# Patient Record
Sex: Male | Born: 1955 | Race: White | Hispanic: No | Marital: Married | Smoking: Current every day smoker
Health system: Southern US, Community
[De-identification: ages and names within clinical notes are randomized; demographics above are authoritative.]

## PROBLEM LIST (undated history)

## (undated) DIAGNOSIS — I2510 Atherosclerotic heart disease of native coronary artery without angina pectoris: Secondary | ICD-10-CM

## (undated) DIAGNOSIS — F419 Anxiety disorder, unspecified: Secondary | ICD-10-CM

## (undated) DIAGNOSIS — I509 Heart failure, unspecified: Secondary | ICD-10-CM

## (undated) DIAGNOSIS — M797 Fibromyalgia: Secondary | ICD-10-CM

## (undated) DIAGNOSIS — E7800 Pure hypercholesterolemia, unspecified: Secondary | ICD-10-CM

## (undated) DIAGNOSIS — R319 Hematuria, unspecified: Secondary | ICD-10-CM

## (undated) DIAGNOSIS — K219 Gastro-esophageal reflux disease without esophagitis: Secondary | ICD-10-CM

## (undated) DIAGNOSIS — I219 Acute myocardial infarction, unspecified: Secondary | ICD-10-CM

## (undated) DIAGNOSIS — T4145XA Adverse effect of unspecified anesthetic, initial encounter: Secondary | ICD-10-CM

## (undated) DIAGNOSIS — G4730 Sleep apnea, unspecified: Secondary | ICD-10-CM

## (undated) DIAGNOSIS — Z9289 Personal history of other medical treatment: Secondary | ICD-10-CM

## (undated) DIAGNOSIS — T8859XA Other complications of anesthesia, initial encounter: Secondary | ICD-10-CM

## (undated) DIAGNOSIS — N189 Chronic kidney disease, unspecified: Secondary | ICD-10-CM

## (undated) DIAGNOSIS — J189 Pneumonia, unspecified organism: Secondary | ICD-10-CM

## (undated) DIAGNOSIS — M1990 Unspecified osteoarthritis, unspecified site: Secondary | ICD-10-CM

## (undated) DIAGNOSIS — C801 Malignant (primary) neoplasm, unspecified: Secondary | ICD-10-CM

## (undated) DIAGNOSIS — E119 Type 2 diabetes mellitus without complications: Secondary | ICD-10-CM

## (undated) DIAGNOSIS — Z992 Dependence on renal dialysis: Secondary | ICD-10-CM

## (undated) DIAGNOSIS — M549 Dorsalgia, unspecified: Secondary | ICD-10-CM

## (undated) DIAGNOSIS — Z8709 Personal history of other diseases of the respiratory system: Secondary | ICD-10-CM

## (undated) DIAGNOSIS — I10 Essential (primary) hypertension: Secondary | ICD-10-CM

## (undated) DIAGNOSIS — IMO0001 Reserved for inherently not codable concepts without codable children: Secondary | ICD-10-CM

## (undated) HISTORY — DX: Acute myocardial infarction, unspecified: I21.9

## (undated) HISTORY — PX: CHOLECYSTECTOMY: SHX55

## (undated) HISTORY — PX: COLONOSCOPY: SHX174

## (undated) HISTORY — PX: EYE SURGERY: SHX253

## (undated) HISTORY — PX: TONSILLECTOMY: SUR1361

---

## 2008-07-21 ENCOUNTER — Ambulatory Visit: Payer: Self-pay | Admitting: Cardiology

## 2013-01-24 ENCOUNTER — Encounter (HOSPITAL_COMMUNITY): Payer: Self-pay

## 2013-01-24 ENCOUNTER — Emergency Department (HOSPITAL_COMMUNITY)
Admission: EM | Admit: 2013-01-24 | Discharge: 2013-01-24 | Disposition: A | Discharge status: Home / Self Care | Payer: Commercial Indemnity | Attending: Emergency Medicine | Admitting: Emergency Medicine

## 2013-01-24 DIAGNOSIS — F17200 Nicotine dependence, unspecified, uncomplicated: Secondary | ICD-10-CM | POA: Insufficient documentation

## 2013-01-24 DIAGNOSIS — Z862 Personal history of diseases of the blood and blood-forming organs and certain disorders involving the immune mechanism: Secondary | ICD-10-CM | POA: Insufficient documentation

## 2013-01-24 DIAGNOSIS — Z8639 Personal history of other endocrine, nutritional and metabolic disease: Secondary | ICD-10-CM | POA: Insufficient documentation

## 2013-01-24 DIAGNOSIS — E119 Type 2 diabetes mellitus without complications: Secondary | ICD-10-CM | POA: Insufficient documentation

## 2013-01-24 DIAGNOSIS — M545 Low back pain, unspecified: Secondary | ICD-10-CM

## 2013-01-24 DIAGNOSIS — G8929 Other chronic pain: Secondary | ICD-10-CM | POA: Insufficient documentation

## 2013-01-24 DIAGNOSIS — I10 Essential (primary) hypertension: Secondary | ICD-10-CM | POA: Insufficient documentation

## 2013-01-24 HISTORY — DX: Dorsalgia, unspecified: M54.9

## 2013-01-24 HISTORY — DX: Essential (primary) hypertension: I10

## 2013-01-24 HISTORY — DX: Type 2 diabetes mellitus without complications: E11.9

## 2013-01-24 HISTORY — DX: Pure hypercholesterolemia, unspecified: E78.00

## 2013-01-24 MED ORDER — HYDROMORPHONE HCL PF 1 MG/ML IJ SOLN
1.0000 mg | INTRAMUSCULAR | Status: AC
  Administered 2013-01-24: 1 mg via INTRAVENOUS
  Filled 2013-01-24: qty 1

## 2013-01-24 MED ORDER — PREDNISONE 20 MG PO TABS: 40.0000 mg | ORAL_TABLET | Freq: Every day | ORAL | Status: DC

## 2013-01-24 MED ORDER — OXYCODONE-ACETAMINOPHEN 10-325 MG PO TABS: 1.0000 | ORAL_TABLET | Freq: Four times a day (QID) | ORAL | Status: DC | PRN

## 2013-01-24 MED ORDER — HYDROMORPHONE HCL PF 1 MG/ML IJ SOLN
1.0000 mg | INTRAMUSCULAR | Status: DC | PRN
  Administered 2013-01-24: 1 mg via INTRAVENOUS
  Filled 2013-01-24: qty 1

## 2013-01-24 NOTE — ED Notes (Signed)
Pt is lying prone in the bed. Spouse reports he tried to ambulate and was unable to d/t pain.

## 2013-01-24 NOTE — ED Notes (Signed)
Pt crying and very upset and anxious because of the pain.

## 2013-01-24 NOTE — ED Notes (Signed)
Back pain began on Tuesday. Has a hx of chronic back pain and denies any injuries.  Woke up Tuesday with severe pain radiates into his rt. Leg unable to put full weight on leg.  Denies any problems with voiding, has not had a BM in two Days.

## 2013-01-24 NOTE — ED Provider Notes (Signed)
  CSN: [id number]     Arrival date & time 01/24/13  1220 History     None    Chief Complaint  Patient presents with  . Back Pain   (Consider location/radiation/quality/duration/timing/severity/associated sxs/prior Treatment) HPI 57 y o W M, with PMH of HTN, DM, hyperlipidemia, and chronic back pain- presented with C/o lower back pain, started 2 days ago, 10/10 pain, started suddenly, radiates down his Rt leg to his ankle, aggav by sitting and certain positions, relieved by lying on his abdomen. No weakness of either extremity, no fecal or urinary incontinence, no fever, pt is also an active smoker- 1pack/day for the past 30years. Patient has had chronic back pain for the past year, usually a 1-2 out of 10 pain, controlled by tylenol. He was told by his doctor who he follows up with for his back pain that he doesn't think the severity is enough to warrant surgery, he got a second opinion that said he will benefit from surgery, so he was trying to get a third opinion. Pt has had an MRI done as an out patient, which showed lumber spondylosis, and herniated intervetebral disc.  Past Medical History  Diagnosis Date  . Back pain   . Hypertension   . Diabetes mellitus without complication   . Hypercholesteremia    Past Surgical History  Procedure Laterality Date  . Cholecystectomy     No family history on file. History  Substance Use Topics  . Smoking status: Current Every Day Smoker  . Smokeless tobacco: Not on file  . Alcohol Use: No    Review of Systems CONSTITUTIONAL- No Fever, weightloss, No night sweat, no appetite change. SKIN- No Rash, colour changes, itching. HEAD- No Headache, No dizziness. EYES- Has had bilat cataract surgery- 2010. RESPIRATORY- Cough, SOB. CARDIAC- No Palpitations, DOE, PND or chest pain. GI- No nausea, vomiting, diarrhoea, constipation,No abd pain, jaundice.  Allergies  Review of patient's allergies indicates no known allergies.  Home Medications  No  current outpatient prescriptions on file. BP 178/82  Pulse 112  Temp(Src) 97.8 F (36.6 C) (Oral)  Resp 25  SpO2 99% Physical Exam GENERAL- alert, co-operative, appears as stated age, in severe painful distress. HEENT- Atraumatic, normocephalic, PERRL, EOMI, oral mucosa appears dry, good and intact dentition. No carotid bruit, no cervical LN enlargement, thyroid does not appear enlarged. CARDIAC- RRR, no murmurs, rubs or gallops. RESP- Moving equal volumes of air, and clear to auscultation bilaterally. ABDOMEN- Soft, nontender, no palpable masses or organomegaly, bowel sounds present. BACK- Normal curvature of the spine, tenderness along the lower lumber vertebrae, no CVA tenderness. NEURO- Cr N 2-12 intact, strenght 4+ equal and present in all extremities, sensation intact in all extremities. EXTREMITIES- pulse 2+, symmetric all extremities. SKIN- Warm, dry, No rash or lesion. PSYCH- Normal mood and affect, appropriate thought content and speech.   ED Course   Procedures (including critical care time)  Labs Reviewed - No data to display No results found. No diagnosis found.  MDM  Patients back pain likely due to herniated disc with unilat sciatica no indications at this time for further imaging. - Conservative management with adeq pain relief- Iv dilaudid- 1mg  Q3H PRN and then  Reassessment. Patient reports marked improvement and is able to walk and sit down on the bed, with no pain. - Discharge patient home on Percocet- 10-325mg  Q6h for 5 days for pain, Prednisone- 40mg  daily 5 days.    Jenetta Downer, MD 01/24/13 1520

## 2013-01-24 NOTE — ED Provider Notes (Signed)
I saw and evaluated the patient, reviewed the resident's note and I agree with the findings and plan. Patient presents emergent complaints of back pain radiating towards his left leg.  Patient has a history of back problems and has had outpatient MRI.  He was instructed by 1 provider to get surgery and another one recommended a more conservative treatment.     Since Tuesday the patient has had more severe pain that has not been relieved by Tylenol. He denies any acute neurologic complaints of weakness, numbness or incontinence.   Symptoms are consistent with sciatica. Plan will be for pain management and reevaluation.   Kathalene Frames, MD 01/24/13 1325

## 2013-01-24 NOTE — ED Notes (Signed)
Family at bedside. 

## 2013-03-05 ENCOUNTER — Other Ambulatory Visit: Payer: Self-pay | Admitting: Neurosurgery

## 2013-03-05 DIAGNOSIS — M47817 Spondylosis without myelopathy or radiculopathy, lumbosacral region: Secondary | ICD-10-CM

## 2013-03-14 ENCOUNTER — Other Ambulatory Visit: Payer: Commercial Indemnity

## 2014-01-21 ENCOUNTER — Other Ambulatory Visit: Payer: Self-pay | Admitting: Neurosurgery

## 2014-02-04 ENCOUNTER — Encounter (HOSPITAL_COMMUNITY): Payer: Self-pay | Admitting: Pharmacy Technician

## 2014-02-06 ENCOUNTER — Encounter (HOSPITAL_COMMUNITY): Payer: Self-pay

## 2014-02-06 ENCOUNTER — Encounter (HOSPITAL_COMMUNITY)
Admission: RE | Admit: 2014-02-06 | Discharge: 2014-02-06 | Disposition: A | Discharge status: Home / Self Care | Payer: Managed Care, Other (non HMO) | Source: Ambulatory Visit | Attending: Anesthesiology | Admitting: Anesthesiology

## 2014-02-06 ENCOUNTER — Encounter (HOSPITAL_COMMUNITY)
Admission: RE | Admit: 2014-02-06 | Discharge: 2014-02-06 | Disposition: A | Discharge status: Home / Self Care | Payer: Managed Care, Other (non HMO) | Source: Ambulatory Visit | Attending: Neurosurgery | Admitting: Neurosurgery

## 2014-02-06 ENCOUNTER — Other Ambulatory Visit (HOSPITAL_COMMUNITY): Payer: Self-pay | Admitting: *Deleted

## 2014-02-06 DIAGNOSIS — M5126 Other intervertebral disc displacement, lumbar region: Secondary | ICD-10-CM | POA: Diagnosis not present

## 2014-02-06 DIAGNOSIS — Z01818 Encounter for other preprocedural examination: Secondary | ICD-10-CM | POA: Insufficient documentation

## 2014-02-06 HISTORY — DX: Sleep apnea, unspecified: G47.30

## 2014-02-06 HISTORY — DX: Chronic kidney disease, unspecified: N18.9

## 2014-02-06 HISTORY — DX: Unspecified osteoarthritis, unspecified site: M19.90

## 2014-02-06 HISTORY — DX: Malignant (primary) neoplasm, unspecified: C80.1

## 2014-02-06 HISTORY — DX: Gastro-esophageal reflux disease without esophagitis: K21.9

## 2014-02-06 HISTORY — DX: Hematuria, unspecified: R31.9

## 2014-02-06 HISTORY — DX: Anxiety disorder, unspecified: F41.9

## 2014-02-06 LAB — CBC
HEMATOCRIT: 32.1 % — AB (ref 39.0–52.0)
Hemoglobin: 10.5 g/dL — ABNORMAL LOW (ref 13.0–17.0)
MCH: 27.8 pg (ref 26.0–34.0)
MCHC: 32.7 g/dL (ref 30.0–36.0)
MCV: 84.9 fL (ref 78.0–100.0)
PLATELETS: 201 10*3/uL (ref 150–400)
RBC: 3.78 MIL/uL — AB (ref 4.22–5.81)
RDW: 14.1 % (ref 11.5–15.5)
WBC: 8.9 10*3/uL (ref 4.0–10.5)

## 2014-02-06 LAB — BASIC METABOLIC PANEL
ANION GAP: 12 (ref 5–15)
BUN: 40 mg/dL — ABNORMAL HIGH (ref 6–23)
CALCIUM: 8.6 mg/dL (ref 8.4–10.5)
CO2: 20 meq/L (ref 19–32)
Chloride: 108 mEq/L (ref 96–112)
Creatinine, Ser: 2.44 mg/dL — ABNORMAL HIGH (ref 0.50–1.35)
GFR calc Af Amer: 32 mL/min — ABNORMAL LOW (ref >90)
GFR calc non Af Amer: 28 mL/min — ABNORMAL LOW (ref >90)
GLUCOSE: 141 mg/dL — AB (ref 70–99)
POTASSIUM: 5.4 meq/L — AB (ref 3.7–5.3)
SODIUM: 140 meq/L (ref 137–147)

## 2014-02-06 LAB — SURGICAL PCR SCREEN
MRSA, PCR: NEGATIVE
Staphylococcus aureus: POSITIVE — AB

## 2014-02-06 NOTE — Progress Notes (Signed)
02/06/14 0945  OBSTRUCTIVE SLEEP APNEA  Have you ever been diagnosed with sleep apnea through a sleep study? No  Do you snore loudly (loud enough to be heard through closed doors)?  1  Do you often feel tired, fatigued, or sleepy during the daytime? 1  Has anyone observed you stop breathing during your sleep? 1  Do you have, or are you being treated for high blood pressure? 1  BMI more than 35 kg/m2? 1  Age over 58 years old? 1  Neck circumference greater than 40 cm/16 inches? 1  Gender: 1  Obstructive Sleep Apnea Score 8

## 2014-02-06 NOTE — Progress Notes (Signed)
Via fax, requesting EKG if available at [facility] from Cholecystectomy surgery.Pt. Unsure where or when he might have had an ekg previously.

## 2014-02-06 NOTE — Progress Notes (Signed)
Pt. Followed By [facility] for PCP, sees Dr. Everlene Other for renal care.  Pt. Reports that he had a stress test 6 yrs. Ago, told that he was just having a panic attack.  Pt. Also reports that he has probably had an EKG in the past but doesn't remember when.

## 2014-02-06 NOTE — Progress Notes (Addendum)
Anesthesia Chart Review:  Pt is 58 year old male scheduled for R L4-5 and R L 5-S1 diskectomies on 02/13/14 with Dr. Christella Noa.   PMH: HTN, diabetes, chronic kidney disease, OSA, anxiety  Medications include: atenolol, lisinopril/hctz, amlodipine, metformin, glipizide, trazodone, xanax  Preoperative labs reviewed.  K=5.4, BUN/Cr=40/2.44. BUN/Cr appear to be stable after review of records from PCP and nephrologist.  11/10/13: BUN/Cr=38/1.93.  01/06/14: BUN/Cr=35/2.45  Will recheck istat DOS to reevaluate K.   Chest x-ray reviewed.   EKG: Sinus bradycardia (49bpm), Cannot rule out Anterior infarct (age undetermined), Nonspecific T wave changes  Previous EKG from [facility] does not show these changes. Discussed with Dr. Deatra Canter. Pt will need cardiac clearance prior to surgery. Left message for Manuela Schwartz in Dr. Lacy Duverney office to notify of this.   Willeen Cass, FNP-BC [facility] Short Stay Surgical Center/Anesthesiology Phone: [phone] 02/11/2014 3:46 PM  Addendum:  Patient was seen by cardiologist Dr. Bronson Ing this morning.  His note is in Epic. He ordered an echo for this afternoon, however, the report is still pending. At some point, he would like patient to have a Lexiscan Cardiolite stress test and sleep study, but it appears he is holding off on ordering these just yet--or at least until he is able to review echo findings.  Manuela Schwartz at Dr. Lacy Duverney office has spoken with the patient regarding moving his surgery to the afternoon in hopes that they will learn by tomorrow morning if he is cleared or not.  Plans to proceed will depend on cardiology input.    George Hugh [facility] Short Stay Center/Anesthesiology Phone [phone] 02/12/2014 5:12 PM  Addendum:  Note in Epic from [place], LPN from this morning states, "Received call from [place] with Dr. Christella Noa this morning. Stated that she could see Echo results & last OV from Dr. Bronson Ing, but did not see specific  clearance. Informed Manuela Schwartz per Dr. Bronson Ing - can proceed with surgery at a low to intermediate risk."   Echo 02/12/14 showed: - Left ventricle: The cavity size was moderately dilated. Wall thickness was increased in a pattern of moderate LVH. Systolic function was normal. The estimated ejection fraction was approximately 55%. Wall motion was normal; there were no regional wall motion abnormalities. Doppler parameters are consistent with abnormal left ventricular relaxation (grade 1 diastolic dysfunction). Doppler parameters are consistent with high ventricular filling pressure. - Aortic valve: Mildly calcified annulus. Trileaflet; mildly thickened leaflets. - Mitral valve: Mildly thickened leaflets . There was trivial regurgitation. - Left atrium: The atrium was mildly dilated. - Right ventricle: The cavity size was mildly dilated. Wall thickness was normal. - Right atrium: The atrium was mildly dilated. - Pulmonic valve: There was mild regurgitation.  George Hugh [facility] Short Stay Center/Anesthesiology Phone [phone] 02/13/2014 9:52 AM

## 2014-02-06 NOTE — Pre-Procedure Instructions (Signed)
Mark [name]  02/06/2014   Your procedure is scheduled on:  Friday, February 13, 2014 at 8:00 AM.   Report to [facility] Entrance "A" Admitting Office at 5:30 AM.   Call this number if you have problems the morning of surgery: [phone]   Remember:   Do not eat food or drink liquids after midnight Thursday, 02/12/14   Take these medicines the morning of surgery with A SIP OF WATER: amLODipine (NORVASC), atenolol (TENORMIN), HYDROcodone-acetaminophen (NORCO/VICODIN) - if needed, ALPRAZolam Duanne Moron) - if needed   Do not wear jewelry.  Do not wear lotions, powders, or colgone. You may wear deodorant.  Men may shave face and neck.  Do not bring valuables to the hospital.  [facility] is not responsible                  for any belongings or valuables.               Contacts, dentures or bridgework may not be worn into surgery.  Leave suitcase in the car. After surgery it may be brought to your room.  For patients admitted to the hospital, discharge time is determined by your                treatment team.            Special Instructions: [facility] - Preparing for Surgery  Before surgery, you can play an important role.  Because skin is not sterile, your skin needs to be as free of germs as possible.  You can reduce the number of germs on you skin by washing with CHG (chlorahexidine gluconate) soap before surgery.  CHG is an antiseptic cleaner which kills germs and bonds with the skin to continue killing germs even after washing.  Please DO NOT use if you have an allergy to CHG or antibacterial soaps.  If your skin becomes reddened/irritated stop using the CHG and inform your nurse when you arrive at Short Stay.  Do not shave (including legs and underarms) for at least 48 hours prior to the first CHG shower.  You may shave your face.  Please follow these instructions carefully:   1.  Shower with CHG Soap the night before surgery and the                                morning  of Surgery.  2.  If you choose to wash your hair, wash your hair first as usual with your       normal shampoo.  3.  After you shampoo, rinse your hair and body thoroughly to remove the                      Shampoo.  4.  Use CHG as you would any other liquid soap.  You can apply chg directly       to the skin and wash gently with scrungie or a clean washcloth.  5.  Apply the CHG Soap to your body ONLY FROM THE NECK DOWN.        Do not use on open wounds or open sores.  Avoid contact with your eyes, ears, mouth and genitals (private parts).  Wash genitals (private parts) with your normal soap.  6.  Wash thoroughly, paying special attention to the area where your surgery        will be performed.  7.  Thoroughly rinse your body with warm water from the neck down.  8.  DO NOT shower/wash with your normal soap after using and rinsing off       the CHG Soap.  9.  Pat yourself dry with a clean towel.            10.  Wear clean pajamas.            11.  Place clean sheets on your bed the night of your first shower and do not        sleep with pets.  Day of Surgery  Do not apply any lotions the morning of surgery.  Please wear clean clothes to the hospital/surgery center.     Please read over the following fact sheets that you were given: Pain Booklet, Coughing and Deep Breathing, MRSA Information and Surgical Site Infection Prevention

## 2014-02-12 ENCOUNTER — Encounter: Payer: Self-pay | Admitting: Cardiovascular Disease

## 2014-02-12 ENCOUNTER — Telehealth: Payer: Self-pay | Admitting: Cardiovascular Disease

## 2014-02-12 ENCOUNTER — Other Ambulatory Visit (INDEPENDENT_AMBULATORY_CARE_PROVIDER_SITE_OTHER): Payer: Managed Care, Other (non HMO)

## 2014-02-12 ENCOUNTER — Other Ambulatory Visit: Payer: Self-pay

## 2014-02-12 ENCOUNTER — Ambulatory Visit (INDEPENDENT_AMBULATORY_CARE_PROVIDER_SITE_OTHER): Payer: Managed Care, Other (non HMO) | Admitting: Cardiovascular Disease

## 2014-02-12 VITALS — BP 136/75 | HR 51 | Ht 72.0 in | Wt 291.0 lb

## 2014-02-12 DIAGNOSIS — R079 Chest pain, unspecified: Secondary | ICD-10-CM

## 2014-02-12 DIAGNOSIS — I10 Essential (primary) hypertension: Secondary | ICD-10-CM

## 2014-02-12 DIAGNOSIS — E785 Hyperlipidemia, unspecified: Secondary | ICD-10-CM

## 2014-02-12 DIAGNOSIS — Z9114 Patient's other noncompliance with medication regimen: Secondary | ICD-10-CM

## 2014-02-12 DIAGNOSIS — F17200 Nicotine dependence, unspecified, uncomplicated: Secondary | ICD-10-CM

## 2014-02-12 DIAGNOSIS — R9431 Abnormal electrocardiogram [ECG] [EKG]: Secondary | ICD-10-CM

## 2014-02-12 DIAGNOSIS — Z01818 Encounter for other preprocedural examination: Secondary | ICD-10-CM

## 2014-02-12 DIAGNOSIS — Z7189 Other specified counseling: Secondary | ICD-10-CM

## 2014-02-12 DIAGNOSIS — R001 Bradycardia, unspecified: Secondary | ICD-10-CM

## 2014-02-12 DIAGNOSIS — I498 Other specified cardiac arrhythmias: Secondary | ICD-10-CM

## 2014-02-12 DIAGNOSIS — Z716 Tobacco abuse counseling: Secondary | ICD-10-CM

## 2014-02-12 DIAGNOSIS — G4733 Obstructive sleep apnea (adult) (pediatric): Secondary | ICD-10-CM

## 2014-02-12 DIAGNOSIS — Z91199 Patient's noncompliance with other medical treatment and regimen due to unspecified reason: Secondary | ICD-10-CM

## 2014-02-12 DIAGNOSIS — Z9119 Patient's noncompliance with other medical treatment and regimen: Secondary | ICD-10-CM

## 2014-02-12 DIAGNOSIS — N184 Chronic kidney disease, stage 4 (severe): Secondary | ICD-10-CM

## 2014-02-12 MED ORDER — CEFAZOLIN SODIUM-DEXTROSE 2-3 GM-% IV SOLR
2.0000 g | INTRAVENOUS | Status: AC
  Administered 2014-02-13: 2 g via INTRAVENOUS
  Filled 2014-02-12: qty 50

## 2014-02-12 NOTE — Telephone Encounter (Signed)
Pt has Mark [name].  No precert required for 2D echo.

## 2014-02-12 NOTE — Patient Instructions (Signed)
Continue all current medications. Your physician has requested that you have an echocardiogram. Echocardiography is a painless test that uses sound waves to create images of your heart. It provides your doctor with information about the size and shape of your heart and how well your heart's chambers and valves are working. This procedure takes approximately one hour. There are no restrictions for this procedure. Labs for [facility] Office will contact with results via phone or letter.   Follow up in  2 months

## 2014-02-12 NOTE — Telephone Encounter (Signed)
Echo- dx: abnormal ekg & cp (TODAY PLEASE) Checking percert

## 2014-02-12 NOTE — Progress Notes (Signed)
Patient ID: Mark [name], male   DOB: 05/13/1956, 58 y.o.   MRN: [id number]       CARDIOLOGY CONSULT NOTE  Patient ID: Mark [name] MRN: [id number] DOB/AGE: 09/04/1955 58 y.o.  Admit date: (Not on file) Primary Physician TAPPER,DAVID B, MD  Reason for Consultation: bradycardia, abnormal ECG  HPI: The patient is a 58 year old male with a past medical history significant for hypertension, type 2 diabetes mellitus, obesity, chronic kidney disease, history of tobacco abuse, untreated severe obstructive sleep apnea and hyperlipidemia who is being scheduled for R L4-5 and R L 5-S1 diskectomies on 02/13/14 with Dr. Christella Noa.  ECG on 02/06/2014 demonstrated sinus bradycardia, heart rate 49 beats per minute, with a diffuse nonspecific ST segment and T wave abnormality. Recent basic metabolic panel demonstrated potassium 5.4, BUN 40, and creatinine 2.44 with a calibrated GFR of 28 mL per minute.  He has been experiencing severe lower back and leg pain. He used to swim 100 miles per year but now is only able to do stretching exercises in the pool. He denies exertional chest discomfort. He works as a Administrator. When he has to turn the very heavy landing gear on the truck, he will experience dyspnea and what he describes as "heartburn". He said it is very cumbersome. He has acid reflux which is provoked by eating red meat. He denies orthopnea and paroxysmal nocturnal dyspnea. He attempted a sleep study 3-4 years ago but was unable to complete it. He was told that he has severe sleep apnea which was reportedly documented at the time of his cholecystectomy in January 2014. He believes he had a stress test 6 years ago. He denies leg swelling.  Fam: Biological father died of prostate CA. Grandfather had MI in 28's.  Soc: Had smoked 1.5 ppd x 40 years, now smokes 1-2 cigarettes daily. Truck driver. Married.   No Known Allergies  Current Outpatient Prescriptions  Medication Sig Dispense Refill  .  ALPRAZolam (XANAX) 0.5 MG tablet Take 0.5 mg by mouth daily as needed for anxiety.      Marland Kitchen amLODipine (NORVASC) 10 MG tablet Take 10 mg by mouth at bedtime.       Marland Kitchen atenolol (TENORMIN) 50 MG tablet Take 50 mg by mouth daily as needed (for blood pressure over 130/90).      Marland Kitchen atorvastatin (LIPITOR) 40 MG tablet Take 40 mg by mouth at bedtime.      . famotidine (PEPCID) 20 MG tablet Take 20 mg by mouth daily as needed for heartburn or indigestion.      Marland Kitchen glipiZIDE (GLUCOTROL XL) 10 MG 24 hr tablet Take 10 mg by mouth daily.      Marland Kitchen HYDROcodone-acetaminophen (NORCO/VICODIN) 5-325 MG per tablet Take 1 tablet by mouth daily as needed for moderate pain.       Marland Kitchen lisinopril-hydrochlorothiazide (PRINZIDE,ZESTORETIC) 20-12.5 MG per tablet Take 1 tablet by mouth at bedtime.       . metFORMIN (GLUCOPHAGE) 850 MG tablet Take 850 mg by mouth 2 (two) times daily with a meal.      . traZODone (DESYREL) 50 MG tablet Take 50 mg by mouth at bedtime as needed for sleep.        No current facility-administered medications for this visit.    Past Medical History  Diagnosis Date  . Back pain   . Hypertension   . Diabetes mellitus without complication   . Hypercholesteremia   . Anxiety     occas. panic attack, takes xanax occas  .  Hematuria     being followed by Dr. Hinda Lenis for decreased kidney function   . Chronic kidney disease   . GERD (gastroesophageal reflux disease)     otc- pepcid , approx. every other  day    . Arthritis     herniated disc, lumbar   . 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 Sleep apnea     test aborted, due to not able to relax , since the aborted test he had surgery for gallbladder & he reports that he was told that he has sleep apnea     Past Surgical History  Procedure Laterality Date  . Cholecystectomy    . Tonsillectomy    . Eye surgery      cataracts remove, bilateral, w/IOL.    History   Social History  . Marital Status: Married    Spouse Name: N/A    Number of  Children: N/A  . Years of Education: N/A   Occupational History  . Not on file.   Social History Main Topics  . Smoking status: Former Smoker -- 1.50 packs/day for 40 years    Types: Cigarettes    Start date: 10/17/1973    Quit date: 01/29/2014  . Smokeless tobacco: Never Used     Comment: haven't smoked any cigarettes in 2 weeks  . Alcohol Use: No  . Drug Use: No  . Sexual Activity: Not on file   Other Topics Concern  . Not on file   Social History Narrative  . No narrative on file       Prior to Admission medications   Medication Sig Start Date End Date Taking? Authorizing Provider  ALPRAZolam Duanne Moron) 0.5 MG tablet Take 0.5 mg by mouth daily as needed for anxiety.   Yes Historical Provider, MD  amLODipine (NORVASC) 10 MG tablet Take 10 mg by mouth at bedtime.    Yes Historical Provider, MD  atenolol (TENORMIN) 50 MG tablet Take 50 mg by mouth daily as needed (for blood pressure over 130/90).   Yes Historical Provider, MD  atorvastatin (LIPITOR) 40 MG tablet Take 40 mg by mouth at bedtime.   Yes Historical Provider, MD  famotidine (PEPCID) 20 MG tablet Take 20 mg by mouth daily as needed for heartburn or indigestion.   Yes Historical Provider, MD  glipiZIDE (GLUCOTROL XL) 10 MG 24 hr tablet Take 10 mg by mouth daily.   Yes Historical Provider, MD  HYDROcodone-acetaminophen (NORCO/VICODIN) 5-325 MG per tablet Take 1 tablet by mouth daily as needed for moderate pain.    Yes Historical Provider, MD  lisinopril-hydrochlorothiazide (PRINZIDE,ZESTORETIC) 20-12.5 MG per tablet Take 1 tablet by mouth at bedtime.    Yes Historical Provider, MD  metFORMIN (GLUCOPHAGE) 850 MG tablet Take 850 mg by mouth 2 (two) times daily with a meal.   Yes Historical Provider, MD  traZODone (DESYREL) 50 MG tablet Take 50 mg by mouth at bedtime as needed for sleep.    Yes Historical Provider, MD     Review of systems complete and found to be negative unless listed above in HPI     Physical  exam Blood pressure 136/75, pulse 51, height 6' (1.829 m), weight 291 lb (131.997 kg). General: NAD, morbidly obese Neck: No JVD, no thyromegaly or thyroid nodule.  Lungs: Clear to auscultation bilaterally with normal respiratory effort. CV: Nondisplaced PMI. Regular rate and rhythm, normal S1/S2, no S3/S4, no murmur.  No peripheral edema.  No carotid bruit. Venous  varicosities b/l.  Abdomen: Soft, obese.  Skin: Intact without lesions or rashes.  Neurologic: Alert and oriented x 3.  Psych: Normal affect. Extremities: No clubbing or cyanosis.  HEENT: Normal.   ECG: Most recent ECG reviewed.  Labs:   Lab Results  Component Value Date   WBC 8.9 02/06/2014   HGB 10.5* 02/06/2014   HCT 32.1* 02/06/2014   MCV 84.9 02/06/2014   PLT 201 02/06/2014    Recent Labs Lab 02/06/14 1030  NA 140  K 5.4*  CL 108  CO2 20  BUN 40*  CREATININE 2.44*  CALCIUM 8.6  GLUCOSE 141*   No results found for this basename: CKTOTAL, CKMB, CKMBINDEX, TROPONINI    No results found for this basename: CHOL   No results found for this basename: HDL   No results found for this basename: LDLCALC   No results found for this basename: TRIG   No results found for this basename: CHOLHDL   No results found for this basename: LDLDIRECT         Studies: No results found.  ASSESSMENT AND PLAN:  1. Preoperative risk stratification: Given the abnormality seen on his ECG as well as his symptoms of chest discomfort described as heartburn when performing strenuous activities involving his truck, I would recommend some form of preoperative cardiac testing. However, his surgery is scheduled for tomorrow. I will try and obtain an echocardiogram to assess his left ventricular systolic function and regional wall motion. At some point, he will need a nuclear myocardial perfusion study to evaluate for occult ischemic heart disease. He is currently taking Lipitor 40 mg daily. 2. Essential HTN: Controlled on present  therapy. 3. Hyperlipidemia: Currently taking Lipitor 40 mg daily. 4. Bradycardia/abnormal ECG: Possibly secondary to untreated severe obstructive sleep apnea. Will also check TSH to rule out hypothyroidism. Recommend Lexiscan Cardiolite stress test in future. Unable to obtain prior to surgery. 5. OSA: I strongly encouraged him to pursue a sleep study, as sleep apnea is associated with increased cardiac morbidity and mortality as well as arrhythmias and bradycardia. 6. CKD stage 4: BUN 40, creat 2.44, GFR 28 ml/min. 7. Morbid obesity: After surgery, hopefully he will be able to begin swimming again. 8. Tobacco abuse: Cessation counseling given. 9. Chest pain: See #1.  Dispo: f/u 2 months.   Signed: [name], M.D., F.A.C.C.  02/12/2014, 9:34 AM

## 2014-02-13 ENCOUNTER — Inpatient Hospital Stay (HOSPITAL_COMMUNITY)
Admission: RE | Admit: 2014-02-13 | Discharge: 2014-02-14 | DRG: 520 | Disposition: A | Discharge status: Home / Self Care | Payer: Managed Care, Other (non HMO) | Source: Ambulatory Visit | Attending: Neurosurgery | Admitting: Neurosurgery

## 2014-02-13 ENCOUNTER — Ambulatory Visit (HOSPITAL_COMMUNITY): Payer: Managed Care, Other (non HMO)

## 2014-02-13 ENCOUNTER — Encounter (HOSPITAL_COMMUNITY): Payer: Self-pay | Admitting: *Deleted

## 2014-02-13 ENCOUNTER — Encounter (HOSPITAL_COMMUNITY): Payer: Managed Care, Other (non HMO) | Admitting: Emergency Medicine

## 2014-02-13 ENCOUNTER — Telehealth: Payer: Self-pay | Admitting: *Deleted

## 2014-02-13 ENCOUNTER — Ambulatory Visit (HOSPITAL_COMMUNITY): Payer: Managed Care, Other (non HMO) | Admitting: Anesthesiology

## 2014-02-13 ENCOUNTER — Encounter (HOSPITAL_COMMUNITY)
Admission: RE | Disposition: A | Discharge status: Home / Self Care | Payer: Self-pay | Source: Ambulatory Visit | Attending: Neurosurgery

## 2014-02-13 DIAGNOSIS — Z8249 Family history of ischemic heart disease and other diseases of the circulatory system: Secondary | ICD-10-CM | POA: Diagnosis not present

## 2014-02-13 DIAGNOSIS — E119 Type 2 diabetes mellitus without complications: Secondary | ICD-10-CM | POA: Diagnosis present

## 2014-02-13 DIAGNOSIS — G4730 Sleep apnea, unspecified: Secondary | ICD-10-CM | POA: Diagnosis present

## 2014-02-13 DIAGNOSIS — M5126 Other intervertebral disc displacement, lumbar region: Secondary | ICD-10-CM | POA: Diagnosis present

## 2014-02-13 DIAGNOSIS — N189 Chronic kidney disease, unspecified: Secondary | ICD-10-CM | POA: Diagnosis present

## 2014-02-13 DIAGNOSIS — F411 Generalized anxiety disorder: Secondary | ICD-10-CM | POA: Diagnosis present

## 2014-02-13 DIAGNOSIS — E875 Hyperkalemia: Secondary | ICD-10-CM | POA: Diagnosis present

## 2014-02-13 DIAGNOSIS — E7800 Pure hypercholesterolemia, unspecified: Secondary | ICD-10-CM | POA: Diagnosis present

## 2014-02-13 DIAGNOSIS — F17200 Nicotine dependence, unspecified, uncomplicated: Secondary | ICD-10-CM | POA: Diagnosis present

## 2014-02-13 DIAGNOSIS — Z9289 Personal history of other medical treatment: Secondary | ICD-10-CM

## 2014-02-13 DIAGNOSIS — Z800 Family history of malignant neoplasm of digestive organs: Secondary | ICD-10-CM

## 2014-02-13 DIAGNOSIS — I129 Hypertensive chronic kidney disease with stage 1 through stage 4 chronic kidney disease, or unspecified chronic kidney disease: Secondary | ICD-10-CM | POA: Diagnosis present

## 2014-02-13 DIAGNOSIS — M79609 Pain in unspecified limb: Secondary | ICD-10-CM | POA: Diagnosis present

## 2014-02-13 DIAGNOSIS — K219 Gastro-esophageal reflux disease without esophagitis: Secondary | ICD-10-CM | POA: Diagnosis present

## 2014-02-13 DIAGNOSIS — E785 Hyperlipidemia, unspecified: Secondary | ICD-10-CM | POA: Diagnosis present

## 2014-02-13 DIAGNOSIS — I10 Essential (primary) hypertension: Secondary | ICD-10-CM

## 2014-02-13 DIAGNOSIS — Z8042 Family history of malignant neoplasm of prostate: Secondary | ICD-10-CM | POA: Diagnosis not present

## 2014-02-13 DIAGNOSIS — I498 Other specified cardiac arrhythmias: Secondary | ICD-10-CM | POA: Diagnosis present

## 2014-02-13 HISTORY — PX: LUMBAR LAMINECTOMY/DECOMPRESSION MICRODISCECTOMY: SHX5026

## 2014-02-13 HISTORY — PX: BACK SURGERY: SHX140

## 2014-02-13 HISTORY — DX: Personal history of other medical treatment: Z92.89

## 2014-02-13 LAB — POCT I-STAT 4, (NA,K, GLUC, HGB,HCT)
Glucose, Bld: 124 mg/dL — ABNORMAL HIGH (ref 70–99)
HEMATOCRIT: 28 % — AB (ref 39.0–52.0)
HEMOGLOBIN: 9.5 g/dL — AB (ref 13.0–17.0)
Potassium: 5.6 mEq/L — ABNORMAL HIGH (ref 3.7–5.3)
Sodium: 140 mEq/L (ref 137–147)

## 2014-02-13 LAB — GLUCOSE, CAPILLARY
GLUCOSE-CAPILLARY: 162 mg/dL — AB (ref 70–99)
Glucose-Capillary: 128 mg/dL — ABNORMAL HIGH (ref 70–99)
Glucose-Capillary: 115 mg/dL — ABNORMAL HIGH (ref 70–99)
Glucose-Capillary: 145 mg/dL — ABNORMAL HIGH (ref 70–99)
Glucose-Capillary: 144 mg/dL — ABNORMAL HIGH (ref 70–99)

## 2014-02-13 LAB — PREPARE RBC (CROSSMATCH)

## 2014-02-13 SURGERY — LUMBAR LAMINECTOMY/DECOMPRESSION MICRODISCECTOMY 2 LEVELS
Anesthesia: General | Site: Spine Lumbar | Laterality: Right

## 2014-02-13 MED ORDER — DEXTROSE 50 % IV SOLN
INTRAVENOUS | Status: AC
  Filled 2014-02-13: qty 50

## 2014-02-13 MED ORDER — GLYCOPYRROLATE 0.2 MG/ML IJ SOLN
INTRAMUSCULAR | Status: DC | PRN
  Administered 2014-02-13: 0.2 mg via INTRAVENOUS
  Administered 2014-02-13: 0.6 mg via INTRAVENOUS
  Administered 2014-02-13: 0.2 mg via INTRAVENOUS

## 2014-02-13 MED ORDER — SODIUM CHLORIDE 0.9 % IV SOLN
1.0000 g | Freq: Once | INTRAVENOUS | Status: AC
  Administered 2014-02-13: .5 g via INTRAVENOUS
  Filled 2014-02-13: qty 10

## 2014-02-13 MED ORDER — MEPERIDINE HCL 25 MG/ML IJ SOLN: 6.2500 mg | INTRAMUSCULAR | Status: DC | PRN

## 2014-02-13 MED ORDER — ATROPINE SULFATE 0.4 MG/ML IJ SOLN
INTRAMUSCULAR | Status: DC | PRN
  Administered 2014-02-13: 0.4 mg via INTRAVENOUS
  Administered 2014-02-13: 0.2 mg via INTRAVENOUS

## 2014-02-13 MED ORDER — POLYETHYLENE GLYCOL 3350 17 G PO PACK
17.0000 g | PACK | Freq: Every day | ORAL | Status: DC | PRN
  Filled 2014-02-13: qty 1

## 2014-02-13 MED ORDER — HYDROCODONE-ACETAMINOPHEN 5-325 MG PO TABS
1.0000 | ORAL_TABLET | ORAL | Status: DC | PRN
  Administered 2014-02-13 – 2014-02-14 (×2): 1 via ORAL
  Filled 2014-02-13 (×2): qty 1

## 2014-02-13 MED ORDER — OXYCODONE-ACETAMINOPHEN 5-325 MG PO TABS
1.0000 | ORAL_TABLET | ORAL | Status: DC | PRN
  Administered 2014-02-14: 1 via ORAL
  Filled 2014-02-13: qty 1

## 2014-02-13 MED ORDER — PROPOFOL 10 MG/ML IV BOLUS
INTRAVENOUS | Status: AC
  Filled 2014-02-13: qty 20

## 2014-02-13 MED ORDER — SODIUM CHLORIDE 0.9 % IV SOLN: Freq: Once | INTRAVENOUS | Status: DC

## 2014-02-13 MED ORDER — FENTANYL CITRATE 0.05 MG/ML IJ SOLN
INTRAMUSCULAR | Status: AC
  Filled 2014-02-13: qty 5

## 2014-02-13 MED ORDER — PHENYLEPHRINE HCL 10 MG/ML IJ SOLN
INTRAMUSCULAR | Status: DC | PRN
  Administered 2014-02-13: 40 ug via INTRAVENOUS

## 2014-02-13 MED ORDER — HEMOSTATIC AGENTS (NO CHARGE) OPTIME
TOPICAL | Status: DC | PRN
  Administered 2014-02-13: 1 via TOPICAL

## 2014-02-13 MED ORDER — ALBUTEROL SULFATE HFA 108 (90 BASE) MCG/ACT IN AERS
INHALATION_SPRAY | RESPIRATORY_TRACT | Status: DC | PRN
  Administered 2014-02-13: 2 via RESPIRATORY_TRACT

## 2014-02-13 MED ORDER — FENTANYL CITRATE 0.05 MG/ML IJ SOLN
25.0000 ug | INTRAMUSCULAR | Status: DC | PRN
  Administered 2014-02-13 (×2): 25 ug via INTRAVENOUS

## 2014-02-13 MED ORDER — SCOPOLAMINE 1 MG/3DAYS TD PT72
1.0000 | MEDICATED_PATCH | TRANSDERMAL | Status: DC
  Administered 2014-02-13: 1.5 mg via TRANSDERMAL
  Filled 2014-02-13: qty 1

## 2014-02-13 MED ORDER — ACETAMINOPHEN 650 MG RE SUPP: 650.0000 mg | RECTAL | Status: DC | PRN

## 2014-02-13 MED ORDER — LIDOCAINE-EPINEPHRINE 0.5 %-1:200000 IJ SOLN
INTRAMUSCULAR | Status: DC | PRN
  Administered 2014-02-13: 10 mL

## 2014-02-13 MED ORDER — HYDROCHLOROTHIAZIDE 12.5 MG PO CAPS
12.5000 mg | ORAL_CAPSULE | Freq: Every day | ORAL | Status: DC
  Administered 2014-02-13 – 2014-02-14 (×2): 12.5 mg via ORAL
  Filled 2014-02-13 (×2): qty 1

## 2014-02-13 MED ORDER — DIAZEPAM 5 MG PO TABS: 5.0000 mg | ORAL_TABLET | Freq: Four times a day (QID) | ORAL | Status: DC | PRN

## 2014-02-13 MED ORDER — ALPRAZOLAM 0.5 MG PO TABS: 0.5000 mg | ORAL_TABLET | Freq: Every day | ORAL | Status: DC | PRN

## 2014-02-13 MED ORDER — MORPHINE SULFATE 2 MG/ML IJ SOLN: 1.0000 mg | INTRAMUSCULAR | Status: DC | PRN

## 2014-02-13 MED ORDER — ROCURONIUM BROMIDE 100 MG/10ML IV SOLN
INTRAVENOUS | Status: DC | PRN
  Administered 2014-02-13: 50 mg via INTRAVENOUS
  Administered 2014-02-13 (×2): 10 mg via INTRAVENOUS
  Administered 2014-02-13: 20 mg via INTRAVENOUS

## 2014-02-13 MED ORDER — AMLODIPINE BESYLATE 10 MG PO TABS
10.0000 mg | ORAL_TABLET | Freq: Every day | ORAL | Status: DC
  Filled 2014-02-13: qty 1

## 2014-02-13 MED ORDER — ATENOLOL 50 MG PO TABS
50.0000 mg | ORAL_TABLET | Freq: Every day | ORAL | Status: DC | PRN
  Filled 2014-02-13: qty 1

## 2014-02-13 MED ORDER — SODIUM CHLORIDE 0.9 % IV SOLN
INTRAVENOUS | Status: DC
  Administered 2014-02-13: 10 mL/h via INTRAVENOUS

## 2014-02-13 MED ORDER — LIDOCAINE HCL (CARDIAC) 20 MG/ML IV SOLN
INTRAVENOUS | Status: AC
  Filled 2014-02-13: qty 5

## 2014-02-13 MED ORDER — PHENOL 1.4 % MT LIQD: 1.0000 | OROMUCOSAL | Status: DC | PRN

## 2014-02-13 MED ORDER — KETOROLAC TROMETHAMINE 30 MG/ML IJ SOLN
INTRAMUSCULAR | Status: AC
  Filled 2014-02-13: qty 1

## 2014-02-13 MED ORDER — INSULIN ASPART 100 UNIT/ML ~~LOC~~ SOLN
SUBCUTANEOUS | Status: DC | PRN
  Administered 2014-02-13: 10 [IU] via SUBCUTANEOUS

## 2014-02-13 MED ORDER — FENTANYL CITRATE 0.05 MG/ML IJ SOLN
INTRAMUSCULAR | Status: AC
  Filled 2014-02-13: qty 2

## 2014-02-13 MED ORDER — MIDAZOLAM HCL 5 MG/5ML IJ SOLN
INTRAMUSCULAR | Status: DC | PRN
  Administered 2014-02-13: 2 mg via INTRAVENOUS

## 2014-02-13 MED ORDER — GLIPIZIDE ER 10 MG PO TB24
10.0000 mg | ORAL_TABLET | Freq: Every day | ORAL | Status: DC
  Administered 2014-02-14: 10 mg via ORAL
  Filled 2014-02-13 (×2): qty 1

## 2014-02-13 MED ORDER — 0.9 % SODIUM CHLORIDE (POUR BTL) OPTIME
TOPICAL | Status: DC | PRN
  Administered 2014-02-13: 1000 mL

## 2014-02-13 MED ORDER — MENTHOL 3 MG MT LOZG: 1.0000 | LOZENGE | OROMUCOSAL | Status: DC | PRN

## 2014-02-13 MED ORDER — ROCURONIUM BROMIDE 50 MG/5ML IV SOLN
INTRAVENOUS | Status: AC
  Filled 2014-02-13: qty 1

## 2014-02-13 MED ORDER — ACETAMINOPHEN 325 MG PO TABS: 650.0000 mg | ORAL_TABLET | ORAL | Status: DC | PRN

## 2014-02-13 MED ORDER — EPHEDRINE SULFATE 50 MG/ML IJ SOLN
INTRAMUSCULAR | Status: DC | PRN
  Administered 2014-02-13: 10 mg via INTRAVENOUS

## 2014-02-13 MED ORDER — SODIUM BICARBONATE 8.4 % IV SOLN
INTRAVENOUS | Status: DC | PRN
  Administered 2014-02-13 (×3): 25 meq via INTRAVENOUS

## 2014-02-13 MED ORDER — FAMOTIDINE 20 MG PO TABS
20.0000 mg | ORAL_TABLET | Freq: Every day | ORAL | Status: DC | PRN
  Filled 2014-02-13: qty 1

## 2014-02-13 MED ORDER — LIDOCAINE HCL (CARDIAC) 20 MG/ML IV SOLN
INTRAVENOUS | Status: DC | PRN
  Administered 2014-02-13: 80 mg via INTRAVENOUS

## 2014-02-13 MED ORDER — ONDANSETRON HCL 4 MG/2ML IJ SOLN
INTRAMUSCULAR | Status: DC | PRN
  Administered 2014-02-13: 4 mg via INTRAVENOUS

## 2014-02-13 MED ORDER — SODIUM CHLORIDE 0.9 % IV SOLN
INTRAVENOUS | Status: DC | PRN
  Administered 2014-02-13: 13:00:00 via INTRAVENOUS

## 2014-02-13 MED ORDER — ONDANSETRON HCL 4 MG/2ML IJ SOLN
4.0000 mg | INTRAMUSCULAR | Status: DC | PRN
  Administered 2014-02-13: 4 mg via INTRAVENOUS
  Filled 2014-02-13: qty 2

## 2014-02-13 MED ORDER — NEOSTIGMINE METHYLSULFATE 10 MG/10ML IV SOLN
INTRAVENOUS | Status: DC | PRN
  Administered 2014-02-13: 4 mg via INTRAVENOUS

## 2014-02-13 MED ORDER — FENTANYL CITRATE 0.05 MG/ML IJ SOLN
INTRAMUSCULAR | Status: DC | PRN
  Administered 2014-02-13: 100 ug via INTRAVENOUS
  Administered 2014-02-13 (×3): 50 ug via INTRAVENOUS

## 2014-02-13 MED ORDER — ONDANSETRON HCL 4 MG/2ML IJ SOLN
INTRAMUSCULAR | Status: AC
  Filled 2014-02-13: qty 2

## 2014-02-13 MED ORDER — DEXMEDETOMIDINE HCL 200 MCG/2ML IV SOLN
INTRAVENOUS | Status: DC | PRN
  Administered 2014-02-13 (×3): 10 ug via INTRAVENOUS

## 2014-02-13 MED ORDER — TRAZODONE HCL 50 MG PO TABS
50.0000 mg | ORAL_TABLET | Freq: Every evening | ORAL | Status: DC | PRN
  Filled 2014-02-13: qty 1

## 2014-02-13 MED ORDER — MIDAZOLAM HCL 2 MG/2ML IJ SOLN
INTRAMUSCULAR | Status: AC
  Filled 2014-02-13: qty 2

## 2014-02-13 MED ORDER — ATORVASTATIN CALCIUM 40 MG PO TABS
40.0000 mg | ORAL_TABLET | Freq: Every day | ORAL | Status: DC
  Administered 2014-02-13: 40 mg via ORAL
  Filled 2014-02-13 (×2): qty 1

## 2014-02-13 MED ORDER — INSULIN ASPART 100 UNIT/ML ~~LOC~~ SOLN
SUBCUTANEOUS | Status: AC
  Filled 2014-02-13: qty 1

## 2014-02-13 MED ORDER — DEXTROSE 50 % IV SOLN
INTRAVENOUS | Status: DC | PRN
  Administered 2014-02-13: 12.5 g via INTRAVENOUS

## 2014-02-13 MED ORDER — SODIUM CHLORIDE 0.9 % IJ SOLN
3.0000 mL | Freq: Two times a day (BID) | INTRAMUSCULAR | Status: DC
  Administered 2014-02-13: 3 mL via INTRAVENOUS

## 2014-02-13 MED ORDER — PROPOFOL 10 MG/ML IV BOLUS
INTRAVENOUS | Status: DC | PRN
  Administered 2014-02-13: 160 mg via INTRAVENOUS
  Administered 2014-02-13: 40 mg via INTRAVENOUS

## 2014-02-13 MED ORDER — POTASSIUM CHLORIDE IN NACL 20-0.9 MEQ/L-% IV SOLN: INTRAVENOUS | Status: DC

## 2014-02-13 MED ORDER — METFORMIN HCL 850 MG PO TABS: 850.0000 mg | ORAL_TABLET | Freq: Two times a day (BID) | ORAL | Status: DC

## 2014-02-13 MED ORDER — KETOROLAC TROMETHAMINE 30 MG/ML IJ SOLN
30.0000 mg | Freq: Four times a day (QID) | INTRAMUSCULAR | Status: DC
  Administered 2014-02-13 – 2014-02-14 (×4): 30 mg via INTRAVENOUS
  Filled 2014-02-13 (×6): qty 1

## 2014-02-13 MED ORDER — THROMBIN 5000 UNITS EX SOLR
CUTANEOUS | Status: DC | PRN
Start: 2014-02-13 — End: 2014-02-13
  Administered 2014-02-13 (×2): 5000 [IU] via TOPICAL

## 2014-02-13 MED ORDER — SENNA 8.6 MG PO TABS
1.0000 | ORAL_TABLET | Freq: Two times a day (BID) | ORAL | Status: DC
  Administered 2014-02-13 – 2014-02-14 (×2): 8.6 mg via ORAL
  Filled 2014-02-13 (×3): qty 1

## 2014-02-13 MED ORDER — SODIUM CHLORIDE 0.9 % IV SOLN: 250.0000 mL | INTRAVENOUS | Status: DC

## 2014-02-13 MED ORDER — LISINOPRIL-HYDROCHLOROTHIAZIDE 20-12.5 MG PO TABS: 1.0000 | ORAL_TABLET | Freq: Every day | ORAL | Status: DC

## 2014-02-13 MED ORDER — SODIUM CHLORIDE 0.9 % IV SOLN
INTRAVENOUS | Status: DC
  Administered 2014-02-13: 21:00:00 via INTRAVENOUS

## 2014-02-13 MED ORDER — LISINOPRIL 20 MG PO TABS: 20.0000 mg | ORAL_TABLET | Freq: Every day | ORAL | Status: DC

## 2014-02-13 MED ORDER — PROMETHAZINE HCL 25 MG/ML IJ SOLN: 6.2500 mg | INTRAMUSCULAR | Status: DC | PRN

## 2014-02-13 MED ORDER — ALBUMIN HUMAN 5 % IV SOLN
INTRAVENOUS | Status: DC | PRN
  Administered 2014-02-13 (×3): via INTRAVENOUS

## 2014-02-13 MED ORDER — INSULIN ASPART 100 UNIT/ML ~~LOC~~ SOLN
0.0000 [IU] | SUBCUTANEOUS | Status: DC
  Administered 2014-02-13: 3 [IU] via SUBCUTANEOUS
  Administered 2014-02-13 – 2014-02-14 (×3): 2 [IU] via SUBCUTANEOUS

## 2014-02-13 MED ORDER — SODIUM CHLORIDE 0.9 % IJ SOLN: 3.0000 mL | INTRAMUSCULAR | Status: DC | PRN

## 2014-02-13 SURGICAL SUPPLY — 55 items
ADH SKN CLS APL DERMABOND .7 (GAUZE/BANDAGES/DRESSINGS) ×1
APL SKNCLS STERI-STRIP NONHPOA (GAUZE/BANDAGES/DRESSINGS)
BAG DECANTER FOR FLEXI CONT (MISCELLANEOUS) ×2 IMPLANT
BENZOIN TINCTURE PRP APPL 2/3 (GAUZE/BANDAGES/DRESSINGS) IMPLANT
BLADE SURG ROTATE 9660 (MISCELLANEOUS) IMPLANT
BUR MATCHSTICK NEURO 3.0 LAGG (BURR) ×2 IMPLANT
CANISTER SUCT 3000ML (MISCELLANEOUS) ×2 IMPLANT
CONT SPEC 4OZ CLIKSEAL STRL BL (MISCELLANEOUS) ×2 IMPLANT
DECANTER SPIKE VIAL GLASS SM (MISCELLANEOUS) ×2 IMPLANT
DERMABOND ADVANCED (GAUZE/BANDAGES/DRESSINGS) ×1
DERMABOND ADVANCED .7 DNX12 (GAUZE/BANDAGES/DRESSINGS) ×1 IMPLANT
DRAPE LAPAROTOMY 100X72X124 (DRAPES) ×2 IMPLANT
DRAPE MICROSCOPE LEICA (MISCELLANEOUS) ×2 IMPLANT
DRAPE POUCH INSTRU U-SHP 10X18 (DRAPES) ×2 IMPLANT
DRAPE SURG 17X23 STRL (DRAPES) ×2 IMPLANT
DURAPREP 26ML APPLICATOR (WOUND CARE) ×2 IMPLANT
ELECT REM PT RETURN 9FT ADLT (ELECTROSURGICAL) ×2
ELECTRODE REM PT RTRN 9FT ADLT (ELECTROSURGICAL) ×1 IMPLANT
GAUZE SPONGE 4X4 12PLY STRL (GAUZE/BANDAGES/DRESSINGS) IMPLANT
GAUZE SPONGE 4X4 16PLY XRAY LF (GAUZE/BANDAGES/DRESSINGS) IMPLANT
GLOVE BIOGEL PI IND STRL 7.0 (GLOVE) IMPLANT
GLOVE BIOGEL PI INDICATOR 7.0 (GLOVE) ×3
GLOVE ECLIPSE 6.5 STRL STRAW (GLOVE) ×2 IMPLANT
GLOVE EXAM NITRILE LRG STRL (GLOVE) IMPLANT
GLOVE EXAM NITRILE MD LF STRL (GLOVE) IMPLANT
GLOVE EXAM NITRILE XL STR (GLOVE) IMPLANT
GLOVE EXAM NITRILE XS STR PU (GLOVE) IMPLANT
GLOVE SS BIOGEL STRL SZ 6.5 (GLOVE) IMPLANT
GLOVE SUPERSENSE BIOGEL SZ 6.5 (GLOVE) ×2
GOWN STRL REUS W/ TWL LRG LVL3 (GOWN DISPOSABLE) ×2 IMPLANT
GOWN STRL REUS W/ TWL XL LVL3 (GOWN DISPOSABLE) IMPLANT
GOWN STRL REUS W/TWL 2XL LVL3 (GOWN DISPOSABLE) IMPLANT
GOWN STRL REUS W/TWL LRG LVL3 (GOWN DISPOSABLE) ×6
GOWN STRL REUS W/TWL XL LVL3 (GOWN DISPOSABLE)
KIT BASIN OR (CUSTOM PROCEDURE TRAY) ×2 IMPLANT
KIT ROOM TURNOVER OR (KITS) ×2 IMPLANT
NDL HYPO 25X1 1.5 SAFETY (NEEDLE) ×1 IMPLANT
NDL SPNL 18GX3.5 QUINCKE PK (NEEDLE) IMPLANT
NEEDLE HYPO 25X1 1.5 SAFETY (NEEDLE) ×2 IMPLANT
NEEDLE SPNL 18GX3.5 QUINCKE PK (NEEDLE) IMPLANT
NS IRRIG 1000ML POUR BTL (IV SOLUTION) ×2 IMPLANT
PACK LAMINECTOMY NEURO (CUSTOM PROCEDURE TRAY) ×2 IMPLANT
PAD ARMBOARD 7.5X6 YLW CONV (MISCELLANEOUS) ×6 IMPLANT
RUBBERBAND STERILE (MISCELLANEOUS) ×4 IMPLANT
SPONGE LAP 4X18 X RAY DECT (DISPOSABLE) IMPLANT
SPONGE SURGIFOAM ABS GEL SZ50 (HEMOSTASIS) ×2 IMPLANT
STRIP CLOSURE SKIN 1/2X4 (GAUZE/BANDAGES/DRESSINGS) IMPLANT
SUT VIC AB 0 CT1 18XCR BRD8 (SUTURE) ×1 IMPLANT
SUT VIC AB 0 CT1 8-18 (SUTURE) ×2
SUT VIC AB 2-0 CT1 18 (SUTURE) ×3 IMPLANT
SUT VIC AB 3-0 SH 8-18 (SUTURE) ×3 IMPLANT
SYR 20ML ECCENTRIC (SYRINGE) ×2 IMPLANT
TOWEL OR 17X24 6PK STRL BLUE (TOWEL DISPOSABLE) ×2 IMPLANT
TOWEL OR 17X26 10 PK STRL BLUE (TOWEL DISPOSABLE) ×2 IMPLANT
WATER STERILE IRR 1000ML POUR (IV SOLUTION) ×2 IMPLANT

## 2014-02-13 NOTE — Plan of Care (Signed)
Problem: Consults Goal: Diagnosis - Spinal Surgery Lumbar Laminectomy (Complex)     

## 2014-02-13 NOTE — Anesthesia Postprocedure Evaluation (Signed)
  Anesthesia Post-op Note  Patient: Mark [name]  Procedure(s) Performed: Procedure(s) with comments: RIGHT LUMBAR FOUR-FIVE, RIGHT LUMBAR FIVE- SACRAL ONE LUMBAR LAMINECTOMY/DECOMPRESSION MICRODISCECTOMY  (Right) - Right L4-5 and Right L5-S1 diskectomies  Patient Location: PACU  Anesthesia Type:General  Level of Consciousness: awake  Airway and Oxygen Therapy: Patient Spontanous Breathing and Patient connected to nasal cannula oxygen  Post-op Pain: moderate  Post-op Assessment: Post-op Vital signs reviewed, Patient's Cardiovascular Status Stable, Respiratory Function Stable, Patent Airway and No signs of Nausea or vomiting  Post-op Vital Signs: Reviewed and stable  Last Vitals:  Filed Vitals:   02/13/14 1619  BP:   Pulse:   Temp: 36.1 C  Resp:     Complications: No apparent anesthesia complications

## 2014-02-13 NOTE — Op Note (Signed)
02/13/2014  4:28 PM  PATIENT:  Mark [name]  58 y.o. male with severe pain in the right lower extremity. He has two herniated discs at L4/5, and L5/S1. He having failed conservative treatment has opted for operative decompression and discetomies.   PRE-OPERATIVE DIAGNOSIS:  lumbar herniated disc L4/5, L5/S1  POST-OPERATIVE DIAGNOSIS:  lumbar herniated disc L4/5,L5/S1  PROCEDURE:  Procedure(s): RIGHT LUMBAR FOUR-FIVE, RIGHT LUMBAR FIVE- SACRAL ONE LUMBAR LAMINECTOMY/DECOMPRESSION MICRODISCECTOMY   SURGEON:  Surgeon(s): Ashok Pall, MD  ASSISTANTS:none  ANESTHESIA:   general  EBL:  Total I/O In: 1200 [I.V.:450; IV Piggyback:750] Out: 100 [Blood:100]  BLOOD ADMINISTERED:none  CELL SAVER GIVEN:none  COUNT:per nursing  DRAINS: none   SPECIMEN:  No Specimen  DICTATION: [name] was taken to the operating room, intubated and placed under a general anesthetic without difficulty. He was positioned prone on a Wilson frame with all pressure points padded. His back was prepped and draped in a sterile manner. I opened the skin with a 10 blade and carried the dissection down to the thoracolumbar fascia. I used both sharp dissection and the monopolar cautery to expose the lamina of L4, L5, and S1. I confirmed my location with an intraoperative xray.  I used the drill, Kerrison punches, and curettes to perform a semihemilaminectomy of L4, at L5/S1 I opened the ligamentum flavum with a 15 blade, then removed it with a Kerrison punch. I used the punches to remove the ligamentum flavum at L4/5 to expose the thecal sac. I brought the microscope into the operative field and  started the decompression of the spinal canal, thecal sac and L5, and S1 root(s). I cauterized epidural veins overlying the disc space at each level then divided them sharply. I opened the disc spaces with a 15 blade and proceeded with discectomies at each level. I used pituitary rongeurs, curettes, and other instruments to  remove disc material. After the discectomies were completed I inspected the L5, and S1 nerve roots and felt they were well decompressed. The discs were quite degenerated at each level.  I explored rostrally, laterally, medially, and caudally and was satisfied with the decompression. I irrigated the wound, then closed in layers. I approximated the thoracolumbar fascia, subcutaneous, and subcuticular planes with vicryl sutures. I used dermabond for a sterile dressing.   PLAN OF CARE: Admit to inpatient   PATIENT DISPOSITION:  PACU - guarded condition.   Delay start of Pharmacological VTE agent (>24hrs) due to surgical blood loss or risk of bleeding:  yes

## 2014-02-13 NOTE — Transfer of Care (Signed)
Immediate Anesthesia Transfer of Care Note  Patient: Mark [name]  Procedure(s) Performed: Procedure(s) with comments: RIGHT LUMBAR FOUR-FIVE, RIGHT LUMBAR FIVE- SACRAL ONE LUMBAR LAMINECTOMY/DECOMPRESSION MICRODISCECTOMY  (Right) - Right L4-5 and Right L5-S1 diskectomies  Patient Location: PACU  Anesthesia Type:General  Level of Consciousness: awake, alert  and oriented  Airway & Oxygen Therapy: Patient connected to face mask oxygen  Post-op Assessment: Report given to PACU RN  Post vital signs: stable  Complications: No apparent anesthesia complications

## 2014-02-13 NOTE — Telephone Encounter (Signed)
Received call from [facility] with Dr. Christella Noa this morning.  Stated that she could see Echo results & last OV from Dr. Bronson Ing, but did not see specific clearance.  Informed Manuela Schwartz per Dr. Bronson Ing - can proceed with surgery at a low to intermediate risk.

## 2014-02-13 NOTE — Anesthesia Preprocedure Evaluation (Addendum)
Anesthesia Evaluation  Patient identified by MRN, date of birth, ID band Patient awake    Reviewed: Allergy & Precautions, H&P , NPO status , Patient's Chart, lab work & pertinent test results  Airway       Dental   Pulmonary sleep apnea (BMI 40 sleep study aborted, clinicaly he has OSA  will manage as OSA) , former smoker (>45 pack year hx. still smokes some),          Cardiovascular hypertension, Pt. on medications  ECHO 02/2014 EF55%, some dilation and hypertrophy, grade 1 diastolic disfunction   Neuro/Psych Anxiety    GI/Hepatic GERD-  ,  Endo/Other  diabetes, Type 2, Oral Hypoglycemic Agents  Renal/GU CRFRenal diseaseCreat 2.4     Musculoskeletal   Abdominal   Peds  Hematology   Anesthesia Other Findings   Reproductive/Obstetrics                          Anesthesia Physical Anesthesia Plan  ASA: IV  Anesthesia Plan: General   Post-op Pain Management:    Induction:   Airway Management Planned: Oral ETT  Additional Equipment:   Intra-op Plan:   Post-operative Plan: Extubation in OR  Informed Consent: I have reviewed the patients History and Physical, chart, labs and discussed the procedure including the risks, benefits and alternatives for the proposed anesthesia with the patient or authorized representative who has indicated his/her understanding and acceptance.     Plan Discussed with:   Anesthesia Plan Comments: (High Risk, Morbid obesity, OSA, HTN, DM, Renal insufficiency, Smoker.  Will manage accordingly, multinodal pain RX to minimize narcotics.)        Anesthesia Quick Evaluation

## 2014-02-13 NOTE — Progress Notes (Signed)
NOTIFIED DR. MANNY OF PATIENT K 5.6 ON ISTAT AND PATIENT ATE CEREAL 330 AM.

## 2014-02-13 NOTE — Plan of Care (Signed)
Problem: Consults Goal: Diagnosis - Spinal Surgery Lumbar Laminectomy (Complex) L4-5 L5-s1

## 2014-02-13 NOTE — H&P (Signed)
BP 143/66  Pulse 47  Temp(Src) 97 F (36.1 C) (Oral)  Resp 18  Ht 6' (1.829 m)  Wt 131.997 kg (291 lb)  BMI 39.46 kg/m2  SpO2 100% HISTORY OF PRESENT ILLNESS: Mark [name] is a 58 year old gentleman who presents today for evaluation and pain he has in his back and right lower extremity since August 2013 with an exacerbation in August 2014. Mark [name] when this first started did not miss anytime at work, but he has sciatic problems in the right lower extremity, troubles walking, his legs would go numb and fall asleep. He underwent two injections and again continues to work. However in August 2014, he simply got up one day, had severe pain and was actually taken to the emergency room by his wife. He saw Dr. Glenna Fellows and has completed a series of three epidural injections without great change. He has had physical therapy in the past. He did miss work this year missing work from 01/22/2013 to 06/03/2013. He states that he is slightly better from his absolute worse, but has plateaued and has not gotten any better outside of that improvement. He has to walk, bent over and with a limp. He used to swim when he was not working six times a week and also participated in [employer]. Now that he has returned to work, he is unable to do that and feels that he is worse for it. On Mark [name] pain chart, he lists pain only in his back and right lower extremity. He states the pain will come and go, sometimes he will limp more when it is hurting more. REVIEW OF SYSTEMS: Positive for back pain and leg pain at rest, eyeglasses, cataracts, hypertension, hypercholesterolemia, arthritis, and diabetes. He denies allergic, hematologic, psychiatric, neurological, skin, genitourinary, gastrointestinal, respiratory, ears, nose, throat, and mouth, constitutional problems. PAST MEDICAL HISTORY:  Current Medical Conditions: Significant for hypertension, diabetes, cholelithiasis, and cholecystectomy. All of his symptoms are  in the right lower extremity. He has no bowel or bladder dysfunction. He has also undergone cataract surgery in the past.  Medications and Allergies: Medications are amlodipine, atenolol, atorvastatin, gabapentin, glipizide, hydrocodone, lisinopril, metformin and tizanidine. He has no known drug allergies. FAMILY HISTORY: Mother is deceased. Father is deceased. Mother died secondary to dementia and heart failure. Father had a myocardial infarction and a history of colon cancer. SOCIAL HISTORY: He is a Administrator. He does not smoke. Quit smoking in August 2014. He does not use alcohol. He does not use illicit drugs. PHYSICAL EXAMINATION: He is 72 inches in height, weighs 288.6 pounds, BMI is 39.14, blood pressure is 137/80, and pulse is 54. On examination, he is alert and oriented x4 and answering all questions appropriately. Memory, language, attention span and fund of knowledge are normal. Speech is clear. Well kempt and in obvious distress. Gait is antalgic. Reflexes are 2+ in biceps, triceps, brachioradialis, knees, and right ankle is trace. Muscle tone, bulk, and coordination otherwise normal. Pupils are equal, round and reactive to light. Full extraocular movements. Full visual fields. Hearing intact to voice. Uvula elevates in midline. Shoulder shrug is normal. Tongue protrudes in the midline. IMAGING STUDIES: MRI of the lumbar spine was reviewed. It shows herniated disc eccentric to the right side at 4-5. It also shows herniated disc eccentric to the right side at L5-S1. Clonus is normal. Cauda equina is normal. No other significant abnormalities appreciated. He certainly has some degenerative disc disease. DIAGNOSIS: Displayed cyst right L4-5 and right L5-S1. L5 and  S1 with radiculopathy. I do believe that at this point Mark [name] would be best served by operative intervention, but that is the decision he needs to make. He is going to return to see Dr. Carloyn Manner and see what he says. This is fairly was  for a second opinion. I gave him the instruction sheet with regards to the operation. He understands and will let me know. BP 143/66  Pulse 47  Temp(Src) 97 F (36.1 C) (Oral)  Resp 18  Ht 6' (1.829 m)  Wt 131.997 kg (291 lb)  BMI 39.46 kg/m2  SpO2 100% Mark [name] has decided to undergo a lumbar discetomy/decompression for a herniated disc  at levels 4/5,5/S1. Risks and benefits including but not limited to bleeding, infection, paralysis, weakness in one or both extremities, bowel and/or bladder dysfunction, need for further surgery, no relief of pain. He understands and wishes to proceed.

## 2014-02-14 DIAGNOSIS — I10 Essential (primary) hypertension: Secondary | ICD-10-CM

## 2014-02-14 DIAGNOSIS — E119 Type 2 diabetes mellitus without complications: Secondary | ICD-10-CM

## 2014-02-14 DIAGNOSIS — M5126 Other intervertebral disc displacement, lumbar region: Principal | ICD-10-CM

## 2014-02-14 DIAGNOSIS — E785 Hyperlipidemia, unspecified: Secondary | ICD-10-CM

## 2014-02-14 LAB — CBC
HCT: 24.5 % — ABNORMAL LOW (ref 39.0–52.0)
Hemoglobin: 8.1 g/dL — ABNORMAL LOW (ref 13.0–17.0)
MCH: 28.1 pg (ref 26.0–34.0)
MCHC: 33.1 g/dL (ref 30.0–36.0)
MCV: 85.1 fL (ref 78.0–100.0)
Platelets: 135 10*3/uL — ABNORMAL LOW (ref 150–400)
RBC: 2.88 MIL/uL — AB (ref 4.22–5.81)
RDW: 14.4 % (ref 11.5–15.5)
WBC: 8.2 10*3/uL (ref 4.0–10.5)

## 2014-02-14 LAB — BASIC METABOLIC PANEL
Anion gap: 11 (ref 5–15)
BUN: 42 mg/dL — ABNORMAL HIGH (ref 6–23)
CO2: 20 mEq/L (ref 19–32)
CREATININE: 2.49 mg/dL — AB (ref 0.50–1.35)
Calcium: 8.4 mg/dL (ref 8.4–10.5)
Chloride: 109 mEq/L (ref 96–112)
GFR calc non Af Amer: 27 mL/min — ABNORMAL LOW (ref >90)
GFR, EST AFRICAN AMERICAN: 31 mL/min — AB (ref >90)
Glucose, Bld: 98 mg/dL (ref 70–99)
Potassium: 5.4 mEq/L — ABNORMAL HIGH (ref 3.7–5.3)
SODIUM: 140 meq/L (ref 137–147)

## 2014-02-14 LAB — GLUCOSE, CAPILLARY
GLUCOSE-CAPILLARY: 125 mg/dL — AB (ref 70–99)
Glucose-Capillary: 96 mg/dL (ref 70–99)
Glucose-Capillary: 150 mg/dL — ABNORMAL HIGH (ref 70–99)

## 2014-02-14 LAB — PROTIME-INR
INR: 1.18 (ref 0.00–1.49)
PROTHROMBIN TIME: 15 s (ref 11.6–15.2)

## 2014-02-14 LAB — APTT: aPTT: 34 seconds (ref 24–37)

## 2014-02-14 NOTE — Progress Notes (Signed)
Received orders for discharge. IV sites were removed and patient was prepared for discharge. Instructions were reviewed with patient and family. Instructions were verbally acknowledged. Pt was transported to awaiting family vehicle without complications. Pt's condition at discharge has improved. Advised to seek further medical assistance if complications develop. Denies any further assistance from nursing at this time.

## 2014-02-14 NOTE — Progress Notes (Signed)
Patient ID: Mark [name], male   DOB: [birth year], 58 y.o.   MRN: [id number] Subjective: Patient reports appropriate back soreness. One episode of mild leg discomfort this morning. No numbness tingling or weakness. Still waiting to void.  Objective: Vital signs in last 24 hours: Temp:  [96.6 F (35.9 C)-98 F (36.7 C)] 98 F (36.7 C) (09/12 0800) Pulse Rate:  [45-82] 53 (09/12 0700) Resp:  [11-18] 14 (09/12 0700) BP: (106-151)/(12-77) 148/62 mmHg (09/12 0700) SpO2:  [92 %-100 %] 99 % (09/12 0700) Arterial Line BP: (121-143)/(48-105) 133/48 mmHg (09/11 1745) Weight:  [131.997 kg (291 lb)-136.5 kg (300 lb 14.9 oz)] 136.5 kg (300 lb 14.9 oz) (09/11 1800)  Intake/Output from previous day: 09/11 0701 - 09/12 0700 In: 2550 [P.O.:50; I.V.:1750; IV Piggyback:750] Out: 950 [Urine:850; Blood:100] Intake/Output this shift:    Neurologic: Grossly normal  Lab Results: Lab Results  Component Value Date   WBC 8.2 02/14/2014   HGB 8.1* 02/14/2014   HCT 24.5* 02/14/2014   MCV 85.1 02/14/2014   PLT 135* 02/14/2014   Lab Results  Component Value Date   INR 1.18 02/14/2014   BMET Lab Results  Component Value Date   NA 140 02/14/2014   K 5.4* 02/14/2014   CL 109 02/14/2014   CO2 20 02/14/2014   GLUCOSE 98 02/14/2014   BUN 42* 02/14/2014   CREATININE 2.49* 02/14/2014   CALCIUM 8.4 02/14/2014    Studies/Results: Dg Lumbar Spine 2-3 Views  02/13/2014   CLINICAL DATA:  Intraoperative localization for spine surgery.  EXAM: LUMBAR SPINE - 2-3 VIEW  COMPARISON:  None.  FINDINGS: Lateral lumbar spine film labeled number 1 demonstrates a spinal needle at the S1 level. The second film demonstrates a surgical instrument marking the L4-5 disc space.  IMPRESSION: L4-5 marked intraoperatively.   Electronically Signed   By: Kalman Jewels M.D.   On: 02/13/2014 22:30    Assessment/Plan: Seems to be making the appropriate recovery. Appreciate medical assistance   LOS: 1 day    [name] 02/14/2014,  8:40 AM

## 2014-02-14 NOTE — Consult Note (Addendum)
PULMONARY / CRITICAL CARE MEDICINE   Name: Mark [name] MRN: [id number] DOB: Jul 18, 1955    ADMISSION DATE:  02/13/2014 CONSULTATION DATE:  9/12   REFERRING MD :  Christella Noa  CHIEF COMPLAINT:  Post operative medical management  INITIAL PRESENTATION:  58 y/o male with hypertension and DM2 admitted on 9/11 for an elective decompression and discetomy of L4/5/S1. PCCM consulted for medical management.  STUDIES:    SIGNIFICANT EVENTS: 9/11 RIGHT LUMBAR FOUR-FIVE, RIGHT LUMBAR FIVE- SACRAL ONE LUMBAR LAMINECTOMY/DECOMPRESSION MICRODISCECTOMY    HISTORY OF PRESENT ILLNESS:  58 y/o male with hypertension and DM2 admitted on 9/11 for an elective decompression and discetomy of L4/5/S1. PCCM consulted for medical management.  The procedure was uncomplicated.  He complains of some back pain post operatively but he says that overall it has improved significantly.  He wants to go home.  PAST MEDICAL HISTORY :  Past Medical History  Diagnosis Date  . Back pain   . Hypertension   . Diabetes mellitus without complication   . Hypercholesteremia   . Anxiety     occas. panic attack, takes xanax occas  . Hematuria     being followed by Dr. Hinda Lenis for decreased kidney function   . Chronic kidney disease   . GERD (gastroesophageal reflux disease)     otc- pepcid , approx. every other  day    . Arthritis     herniated disc, lumbar   . 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 Sleep apnea     test aborted, due to not able to relax , since the aborted test he had surgery for gallbladder & he reports that he was told that he has sleep apnea    Past Surgical History  Procedure Laterality Date  . Cholecystectomy    . Tonsillectomy    . Eye surgery      cataracts remove, bilateral, w/IOL.   Prior to Admission medications   Medication Sig Start Date End Date Taking? Authorizing Provider  ALPRAZolam Duanne Moron) 0.5 MG tablet Take 0.5 mg by mouth daily as needed for anxiety.   Yes Historical Provider,  MD  amLODipine (NORVASC) 10 MG tablet Take 10 mg by mouth at bedtime.    Yes Historical Provider, MD  atenolol (TENORMIN) 50 MG tablet Take 50 mg by mouth daily as needed (for blood pressure over 130/90).   Yes Historical Provider, MD  atorvastatin (LIPITOR) 40 MG tablet Take 40 mg by mouth at bedtime.   Yes Historical Provider, MD  famotidine (PEPCID) 20 MG tablet Take 20 mg by mouth daily as needed for heartburn or indigestion.   Yes Historical Provider, MD  glipiZIDE (GLUCOTROL XL) 10 MG 24 hr tablet Take 10 mg by mouth daily.   Yes Historical Provider, MD  HYDROcodone-acetaminophen (NORCO/VICODIN) 5-325 MG per tablet Take 1 tablet by mouth daily as needed for moderate pain.    Yes Historical Provider, MD  lisinopril-hydrochlorothiazide (PRINZIDE,ZESTORETIC) 20-12.5 MG per tablet Take 1 tablet by mouth at bedtime.    Yes Historical Provider, MD  metFORMIN (GLUCOPHAGE) 850 MG tablet Take 850 mg by mouth 2 (two) times daily with a meal.   Yes Historical Provider, MD  traZODone (DESYREL) 50 MG tablet Take 50 mg by mouth at bedtime as needed for sleep.    Yes Historical Provider, MD   No Known Allergies  FAMILY HISTORY:  Family History  Problem Relation Age of Onset  . Prostate cancer Father   . Heart failure  Maternal Grandmother   . Heart attack Maternal Grandfather   . Colon cancer Maternal Grandfather    SOCIAL HISTORY:  reports that he quit smoking about 2 weeks ago. His smoking use included Cigarettes. He started smoking about 40 years ago. He has a 60 pack-year smoking history. He has never used smokeless tobacco. He reports that he does not drink alcohol or use illicit drugs.  REVIEW OF SYSTEMS:   Gen: Denies fever, chills, weight change, fatigue, night sweats HEENT: Denies blurred vision, double vision, hearing loss, tinnitus, sinus congestion, rhinorrhea, sore throat, neck stiffness, dysphagia PULM: Denies shortness of breath, cough, sputum production, hemoptysis, wheezing CV:  Denies chest pain, edema, orthopnea, paroxysmal nocturnal dyspnea, palpitations GI: Denies abdominal pain, nausea, vomiting, diarrhea, hematochezia, melena, constipation, change in bowel habits GU: Denies dysuria, hematuria, polyuria, oliguria, urethral discharge Endocrine: Denies hot or cold intolerance, polyuria, polyphagia or appetite change Derm: Denies rash, dry skin, scaling or peeling skin change Heme: Denies easy bruising, bleeding, bleeding gums Neuro: Denies headache, numbness, weakness, slurred speech, loss of memory or consciousness   SUBJECTIVE:   VITAL SIGNS: Temp:  [96.6 F (35.9 C)-98 F (36.7 C)] 98 F (36.7 C) (09/12 0800) Pulse Rate:  [45-82] 53 (09/12 1100) Resp:  [11-21] 21 (09/12 1100) BP: (91-151)/(12-77) 130/53 mmHg (09/12 1100) SpO2:  [92 %-100 %] 95 % (09/12 1100) Arterial Line BP: (121-143)/(48-105) 133/48 mmHg (09/11 1745) Weight:  [136.5 kg (300 lb 14.9 oz)] 136.5 kg (300 lb 14.9 oz) (09/11 1800) HEMODYNAMICS:   VENTILATOR SETTINGS:   INTAKE / OUTPUT:  Intake/Output Summary (Last 24 hours) at 02/14/14 1155 Last data filed at 02/14/14 0700  Gross per 24 hour  Intake   2550 ml  Output    950 ml  Net   1600 ml    PHYSICAL EXAMINATION: Gen: well appearing, no acute distress HEENT: NCAT, PERRL, EOMi, OP clear, neck supple without masses PULM: CTA B CV: RRR, no mgr, no JVD AB: BS+, soft, nontender, no hsm Ext: warm, no edema, no clubbing, no cyanosis Derm: no rash or skin breakdown Neuro: A&Ox4, CN II-XII intact, MAEW   LABS:  CBC  Recent Labs Lab 02/13/14 1156 02/14/14 0550  WBC  --  8.2  HGB 9.5* 8.1*  HCT 28.0* 24.5*  PLT  --  135*   Coag's  Recent Labs Lab 02/14/14 0550  APTT 34  INR 1.18   BMET  Recent Labs Lab 02/13/14 1156 02/14/14 0550  NA 140 140  K 5.6* 5.4*  CL  --  109  CO2  --  20  BUN  --  42*  CREATININE  --  2.49*  GLUCOSE 124* 98   Electrolytes  Recent Labs Lab 02/14/14 0550  CALCIUM 8.4    Sepsis Markers No results found for this basename: LATICACIDVEN, PROCALCITON, O2SATVEN,  in the last 168 hours ABG No results found for this basename: PHART, PCO2ART, PO2ART,  in the last 168 hours Liver Enzymes No results found for this basename: AST, ALT, ALKPHOS, BILITOT, ALBUMIN,  in the last 168 hours Cardiac Enzymes No results found for this basename: TROPONINI, PROBNP,  in the last 168 hours Glucose  Recent Labs Lab 02/13/14 1800 02/13/14 1949 02/13/14 2306 02/14/14 0321 02/14/14 0807 02/14/14 1116  GLUCAP 145* 144* 162* 125* 96 150*    Imaging Dg Lumbar Spine 2-3 Views  02/13/2014   CLINICAL DATA:  Intraoperative localization for spine surgery.  EXAM: LUMBAR SPINE - 2-3 VIEW  COMPARISON:  None.  FINDINGS: Lateral  lumbar spine film labeled number 1 demonstrates a spinal needle at the S1 level. The second film demonstrates a surgical instrument marking the L4-5 disc space.  IMPRESSION: L4-5 marked intraoperatively.   Electronically Signed   By: Kalman Jewels M.D.   On: 02/13/2014 22:30     ASSESSMENT / PLAN:  PULMONARY OETT n/a A: No acute issues Concern for possible obstructive sleep apnea P:   -monitor O2 saturation -outpatient sleep study  CARDIOVASCULAR CVL n/a A: Hypertension Bradycardia in OR and on pre-op evaluation, stable no symptoms Pre op echo with LVH, RV dilation/RA dilation > pulmonary hypertension due to OSA? P:  -continue home amlodipine -would recommend cardiology consultation or at least f/u with cardiology in clinic next week to discuss restuls of echocardiogram and bradycardia  RENAL A:  Baseline CKD Mild hyperkalemia P:   -monitor BMET and UOP -renal dose meds -needs BMET next week with PCP  GASTROINTESTINAL A:  No acute issues P:   -advance diet as tolerated  HEMATOLOGIC A:  No acute issues P:  -DVT prophylaxis per neurosurgery  INFECTIOUS A:  No acute issues P:   BCx2 n/a UC n/a Sputum n/a Abx:  none  ENDOCRINE A:  DM2 Hyperlipidemia P:   -home glyburide -SSI -home atorvastatin  NEUROLOGIC A:  S/p L4/5S1 discectomy P:   -Per neurosurgery  TODAY'S SUMMARY: s/p lumbar discectomy, no complications; continue current treatment of CKD, hypertension and DM2 as you are doing.  OK for discharge from my perspective, but pre-op cardiology consult recommended echo (done) and stress test (not done).  If this consult was primarily for bradycardia, which is asymptomatic, I recommend cardiology consultation in house as they saw him for this prior to surgery.  PCCM will sign off  Roselie Awkward, MD [place] Pager: [phone] Cell: [phone] If no response, call [phone]  02/14/2014, 11:55 AM

## 2014-02-14 NOTE — Discharge Summary (Signed)
Physician Discharge Summary  Patient ID: Mark [name] MRN: [id number] DOB/AGE: 58-Feb-1957 58 y.o.  Admit date: 02/13/2014 Discharge date: 02/14/2014  Admission Diagnoses: HNP   Discharge Diagnoses: same   Discharged Condition: stable  Hospital Course: The patient was admitted on 02/13/2014 and taken to the operating room where the patient underwent microdiskectomy. The patient tolerated the procedure well and was taken to the recovery room and then to the ICU in stable condition. The hospital course was routine. There were no complications. The wound remained clean dry and intact. Pt had appropriate back soreness. No complaints of leg pain or new N/T/W. The patient remained afebrile with stable vital signs, and tolerated a regular diet. The patient continued to increase activities, and pain was well controlled with oral pain medications.   Consults: CCM  Significant Diagnostic Studies:  Results for orders placed during the hospital encounter of 02/13/14  GLUCOSE, CAPILLARY      Result Value Ref Range   Glucose-Capillary 128 (*) 70 - 99 mg/dL  GLUCOSE, CAPILLARY      Result Value Ref Range   Glucose-Capillary 115 (*) 70 - 99 mg/dL  CBC      Result Value Ref Range   WBC 8.2  4.0 - 10.5 K/uL   RBC 2.88 (*) 4.22 - 5.81 MIL/uL   Hemoglobin 8.1 (*) 13.0 - 17.0 g/dL   HCT 24.5 (*) 39.0 - 52.0 %   MCV 85.1  78.0 - 100.0 fL   MCH 28.1  26.0 - 34.0 pg   MCHC 33.1  30.0 - 36.0 g/dL   RDW 14.4  11.5 - 15.5 %   Platelets 135 (*) 150 - 400 K/uL  BASIC METABOLIC PANEL      Result Value Ref Range   Sodium 140  137 - 147 mEq/L   Potassium 5.4 (*) 3.7 - 5.3 mEq/L   Chloride 109  96 - 112 mEq/L   CO2 20  19 - 32 mEq/L   Glucose, Bld 98  70 - 99 mg/dL   BUN 42 (*) 6 - 23 mg/dL   Creatinine, Ser 2.49 (*) 0.50 - 1.35 mg/dL   Calcium 8.4  8.4 - 10.5 mg/dL   GFR calc non Af Amer 27 (*) >90 mL/min   GFR calc Af Amer 31 (*) >90 mL/min   Anion gap 11  5 - 15  PROTIME-INR      Result Value  Ref Range   Prothrombin Time 15.0  11.6 - 15.2 seconds   INR 1.18  0.00 - 1.49  APTT      Result Value Ref Range   aPTT 34  24 - 37 seconds  GLUCOSE, CAPILLARY      Result Value Ref Range   Glucose-Capillary 145 (*) 70 - 99 mg/dL  GLUCOSE, CAPILLARY      Result Value Ref Range   Glucose-Capillary 144 (*) 70 - 99 mg/dL   Comment 1 Documented in Chart     Comment 2 Notify RN    GLUCOSE, CAPILLARY      Result Value Ref Range   Glucose-Capillary 162 (*) 70 - 99 mg/dL   Comment 1 Documented in Chart     Comment 2 Notify RN    GLUCOSE, CAPILLARY      Result Value Ref Range   Glucose-Capillary 125 (*) 70 - 99 mg/dL   Comment 1 Documented in Chart     Comment 2 Notify RN    GLUCOSE, CAPILLARY      Result Value Ref Range  Glucose-Capillary 96  70 - 99 mg/dL   Comment 1 Documented in Chart     Comment 2 Notify RN    GLUCOSE, CAPILLARY      Result Value Ref Range   Glucose-Capillary 150 (*) 70 - 99 mg/dL   Comment 1 Documented in Chart     Comment 2 Notify RN    POCT I-STAT 4, (NA,K, GLUC, HGB,HCT)      Result Value Ref Range   Sodium 140  137 - 147 mEq/L   Potassium 5.6 (*) 3.7 - 5.3 mEq/L   Glucose, Bld 124 (*) 70 - 99 mg/dL   HCT 28.0 (*) 39.0 - 52.0 %   Hemoglobin 9.5 (*) 13.0 - 17.0 g/dL  PREPARE RBC (CROSSMATCH)      Result Value Ref Range   Order Confirmation ORDER PROCESSED BY BLOOD BANK    TYPE AND SCREEN      Result Value Ref Range   ABO/RH(D) B POS     Antibody Screen POS     Sample Expiration 02/16/2014     DAT, IgG NEG     Antibody Identification NO CLINICALLY SIGNIFICANT ANTIBODY IDENTIFIED     Unit Number [id number]     Blood Component Type RED CELLS,LR     Unit division 00     Status of Unit ALLOCATED     Transfusion Status OK TO TRANSFUSE     Crossmatch Result COMPATIBLE     Unit Number [id number]     Blood Component Type RED CELLS,LR     Unit division 00     Status of Unit ALLOCATED     Transfusion Status OK TO TRANSFUSE     Crossmatch  Result COMPATIBLE     Unit Number [id number]     Blood Component Type RED CELLS,LR     Unit division 00     Status of Unit ALLOCATED     Transfusion Status OK TO TRANSFUSE     Crossmatch Result COMPATIBLE     Unit Number [id number]     Blood Component Type RED CELLS,LR     Unit division 00     Status of Unit ALLOCATED     Transfusion Status OK TO TRANSFUSE     Crossmatch Result COMPATIBLE     Unit Number [id number]     Blood Component Type RED CELLS,LR     Unit division 00     Status of Unit ALLOCATED     Transfusion Status OK TO TRANSFUSE     Crossmatch Result COMPATIBLE      Dg Chest 2 View  02/06/2014   CLINICAL DATA:  Preop for lumbar laminectomy  EXAM: CHEST  2 VIEW  COMPARISON:  06/10/2010  FINDINGS: Cardiomediastinal silhouette is unremarkable. No acute infiltrate or pleural effusion. No pulmonary edema. Mild degenerative changes mid and lower thoracic spine.  IMPRESSION: No active cardiopulmonary disease. Degenerative changes thoracic spine.   Electronically Signed   By: Lahoma Crocker M.D.   On: 02/06/2014 10:39   Dg Lumbar Spine 2-3 Views  02/13/2014   CLINICAL DATA:  Intraoperative localization for spine surgery.  EXAM: LUMBAR SPINE - 2-3 VIEW  COMPARISON:  None.  FINDINGS: Lateral lumbar spine film labeled number 1 demonstrates a spinal needle at the S1 level. The second film demonstrates a surgical instrument marking the L4-5 disc space.  IMPRESSION: L4-5 marked intraoperatively.   Electronically Signed   By: Kalman Jewels M.D.   On: 02/13/2014 22:30    Antibiotics:  Anti-infectives  Start     Dose/Rate Route Frequency Ordered Stop   02/13/14 0600  ceFAZolin (ANCEF) IVPB 2 g/50 mL premix     2 g 100 mL/hr over 30 Minutes Intravenous On call to O.R. 02/12/14 1359 02/13/14 1317      Discharge Exam: Blood pressure 130/53, pulse 53, temperature 98.2 F (36.8 C), temperature source Oral, resp. rate 21, height 6' (1.829 m), weight 136.5 kg (300 lb 14.9 oz), SpO2  95.00%. Neuro intact Incision CDI  Discharge Medications:     Medication List         ALPRAZolam 0.5 MG tablet  Commonly known as:  XANAX  Take 0.5 mg by mouth daily as needed for anxiety.     amLODipine 10 MG tablet  Commonly known as:  NORVASC  Take 10 mg by mouth at bedtime.     atenolol 50 MG tablet  Commonly known as:  TENORMIN  Take 50 mg by mouth daily as needed (for blood pressure over 130/90).     atorvastatin 40 MG tablet  Commonly known as:  LIPITOR  Take 40 mg by mouth at bedtime.     famotidine 20 MG tablet  Commonly known as:  PEPCID  Take 20 mg by mouth daily as needed for heartburn or indigestion.     glipiZIDE 10 MG 24 hr tablet  Commonly known as:  GLUCOTROL XL  Take 10 mg by mouth daily.     HYDROcodone-acetaminophen 5-325 MG per tablet  Commonly known as:  NORCO/VICODIN  Take 1 tablet by mouth daily as needed for moderate pain.     lisinopril-hydrochlorothiazide 20-12.5 MG per tablet  Commonly known as:  PRINZIDE,ZESTORETIC  Take 1 tablet by mouth at bedtime.     metFORMIN 850 MG tablet  Commonly known as:  GLUCOPHAGE  Take 850 mg by mouth 2 (two) times daily with a meal.     traZODone 50 MG tablet  Commonly known as:  DESYREL  Take 50 mg by mouth at bedtime as needed for sleep.        Disposition: home   Final Dx: microdiskectomy      Discharge Instructions   Call MD for:  difficulty breathing, headache or visual disturbances    Complete by:  As directed      Call MD for:  persistant nausea and vomiting    Complete by:  As directed      Call MD for:  redness, tenderness, or signs of infection (pain, swelling, redness, odor or green/yellow discharge around incision site)    Complete by:  As directed      Call MD for:  severe uncontrolled pain    Complete by:  As directed      Call MD for:  temperature >100.4    Complete by:  As directed      Diet - low sodium heart healthy    Complete by:  As directed      Discharge  instructions    Complete by:  As directed   No heavy lifting, no strenuous activity, no bending or twisting     Increase activity slowly    Complete by:  As directed      Remove dressing in 48 hours    Complete by:  As directed               Signed: Aalyssa Elderkin S 02/14/2014, 1:13 PM

## 2014-02-14 NOTE — Evaluation (Signed)
Physical Therapy Evaluation Patient Details Name: Mark [name] MRN: [id number] DOB: 12-19-1955 Today's Date: 02/14/2014   History of Present Illness  Right L4-5 and Right L5-S1 diskectomies.  Clinical Impression  Patient demonstrates deficits in functional mobility as indicated below. Will need continued skilled PT to address deficits and maximize function. Will see as indicated and progress as tolerated.     Follow Up Recommendations No PT follow up    Equipment Recommendations  None recommended by PT    Recommendations for Other Services       Precautions / Restrictions Precautions Precautions: Back Precaution Comments: for comfort Restrictions Weight Bearing Restrictions: No      Mobility  Bed Mobility Overal bed mobility: Needs Assistance Bed Mobility: Rolling;Sidelying to Sit Rolling: Supervision Sidelying to sit: Supervision       General bed mobility comments: VCs for technique and sequencing, no difficulty or assist needed to perform  Transfers Overall transfer level: Needs assistance Equipment used: Rolling walker (2 wheeled) Transfers: Sit to/from Stand Sit to Stand: Supervision         General transfer comment: VCs for safe hand placement with use of RW  Ambulation/Gait Ambulation/Gait assistance: Supervision Ambulation Distance (Feet): 210 Feet Assistive device: Rolling walker (2 wheeled) Gait Pattern/deviations: Antalgic;Decreased stance time - right;Trunk flexed Gait velocity: decreased   General Gait Details: steady with ambulation dispite weakness and antalgic gait  Stairs            Wheelchair Mobility    Modified Rankin (Stroke Patients Only)       Balance Overall balance assessment: Modified Independent                                           Pertinent Vitals/Pain Pain Assessment: No/denies pain    Home Living Family/patient expects to be discharged to:: Private residence Living Arrangements:  Spouse/significant other Available Help at Discharge: Family Type of Home: House Home Access: Stairs to enter Entrance Stairs-Rails: None Entrance Stairs-Number of Steps: 1 Home Layout: One level Home Equipment: Environmental consultant - 2 wheels;Cane - single point;Bedside commode      Prior Function Level of Independence: Independent               Hand Dominance   Dominant Hand: Right    Extremity/Trunk Assessment   Upper Extremity Assessment: Overall WFL for tasks assessed           Lower Extremity Assessment: RLE deficits/detail RLE Deficits / Details: fucntional strength WNL during manual testing however, during ambulation, hip flexor weakness noted causing heavy reliance on RW, and buckling during ambulation without RW       Communication   Communication: HOH (left ear)  Cognition Arousal/Alertness: Awake/alert Behavior During Therapy: WFL for tasks assessed/performed Overall Cognitive Status: Within Functional Limits for tasks assessed                      General Comments      Exercises        Assessment/Plan    PT Assessment Patient needs continued PT services  PT Diagnosis Difficulty walking;Abnormality of gait;Generalized weakness;Acute pain   PT Problem List Decreased range of motion;Decreased activity tolerance;Decreased balance;Decreased mobility;Pain;Decreased strength  PT Treatment Interventions DME instruction;Gait training;Stair training;Functional mobility training;Therapeutic activities;Therapeutic exercise;Balance training;Patient/family education   PT Goals (Current goals can be found in the Care Plan section) Acute Rehab PT Goals Patient  Stated Goal: to go home PT Goal Formulation: With patient/family Time For Goal Achievement: 02/28/14 Potential to Achieve Goals: Good    Frequency Min 5X/week   Barriers to discharge        Co-evaluation               End of Session Equipment Utilized During Treatment: Gait belt Activity  Tolerance: Patient tolerated treatment well Patient left: in chair;with call bell/phone within reach;with family/visitor present Nurse Communication: Mobility status         Time: [id number] PT Time Calculation (min): 22 min   Charges:   PT Evaluation $Initial PT Evaluation Tier I: 1 Procedure PT Treatments $Gait Training: 8-22 mins   PT G CodesDuncan Dull 02/14/2014, 10:46 AM Alben Deeds, PT DPT  [phone]

## 2014-02-16 ENCOUNTER — Telehealth: Payer: Self-pay | Admitting: *Deleted

## 2014-02-16 ENCOUNTER — Encounter (HOSPITAL_COMMUNITY): Payer: Self-pay | Admitting: Neurosurgery

## 2014-02-16 LAB — TYPE AND SCREEN
ABO/RH(D): B POS
Antibody Screen: POSITIVE
DAT, IgG: NEGATIVE
UNIT DIVISION: 0
UNIT DIVISION: 0
Unit division: 0
Unit division: 0
Unit division: 0

## 2014-02-16 NOTE — Telephone Encounter (Signed)
Pt notified of results

## 2014-02-16 NOTE — Telephone Encounter (Signed)
Message copied by Orion Modest on Mon Feb 16, 2014 12:51 PM ------      Message from: [name] A      Created: Mon Feb 16, 2014 12:14 PM       Normal. ------

## 2014-02-17 LAB — POCT I-STAT 7, (LYTES, BLD GAS, ICA,H+H)
ACID-BASE DEFICIT: 7 mmol/L — AB (ref 0.0–2.0)
Acid-base deficit: 9 mmol/L — ABNORMAL HIGH (ref 0.0–2.0)
Acid-base deficit: 6 mmol/L — ABNORMAL HIGH (ref 0.0–2.0)
Bicarbonate: 18.2 mEq/L — ABNORMAL LOW (ref 20.0–24.0)
Bicarbonate: 20.2 mEq/L (ref 20.0–24.0)
Bicarbonate: 20.2 mEq/L (ref 20.0–24.0)
CALCIUM ION: 1.29 mmol/L — AB (ref 1.12–1.23)
Calcium, Ion: 1.25 mmol/L — ABNORMAL HIGH (ref 1.12–1.23)
Calcium, Ion: 1.22 mmol/L (ref 1.12–1.23)
HCT: 21 % — ABNORMAL LOW (ref 39.0–52.0)
HEMATOCRIT: 23 % — AB (ref 39.0–52.0)
HEMATOCRIT: 24 % — AB (ref 39.0–52.0)
HEMOGLOBIN: 7.1 g/dL — AB (ref 13.0–17.0)
Hemoglobin: 7.8 g/dL — ABNORMAL LOW (ref 13.0–17.0)
Hemoglobin: 8.2 g/dL — ABNORMAL LOW (ref 13.0–17.0)
O2 Saturation: 90 %
O2 Saturation: 92 %
O2 Saturation: 96 %
PCO2 ART: 45 mmHg (ref 35.0–45.0)
PCO2 ART: 44 mmHg (ref 35.0–45.0)
PH ART: 7.216 — AB (ref 7.350–7.450)
PH ART: 7.27 — AB (ref 7.350–7.450)
PO2 ART: 72 mmHg — AB (ref 80.0–100.0)
Potassium: 5.9 mEq/L — ABNORMAL HIGH (ref 3.7–5.3)
Potassium: 5.9 mEq/L — ABNORMAL HIGH (ref 3.7–5.3)
Potassium: 5.7 mEq/L — ABNORMAL HIGH (ref 3.7–5.3)
Sodium: 140 mEq/L (ref 137–147)
Sodium: 142 mEq/L (ref 137–147)
Sodium: 141 mEq/L (ref 137–147)
TCO2: 20 mmol/L (ref 0–100)
TCO2: 22 mmol/L (ref 0–100)
TCO2: 22 mmol/L (ref 0–100)
pCO2 arterial: 50.2 mmHg — ABNORMAL HIGH (ref 35.0–45.0)
pH, Arterial: 7.212 — ABNORMAL LOW (ref 7.350–7.450)
pO2, Arterial: 74 mmHg — ABNORMAL LOW (ref 80.0–100.0)
pO2, Arterial: 102 mmHg — ABNORMAL HIGH (ref 80.0–100.0)

## 2014-02-17 LAB — POCT I-STAT 4, (NA,K, GLUC, HGB,HCT)
Glucose, Bld: 164 mg/dL — ABNORMAL HIGH (ref 70–99)
Glucose, Bld: 139 mg/dL — ABNORMAL HIGH (ref 70–99)
HCT: 25 % — ABNORMAL LOW (ref 39.0–52.0)
HEMATOCRIT: 25 % — AB (ref 39.0–52.0)
HEMOGLOBIN: 8.5 g/dL — AB (ref 13.0–17.0)
HEMOGLOBIN: 8.5 g/dL — AB (ref 13.0–17.0)
Potassium: 6.1 mEq/L — ABNORMAL HIGH (ref 3.7–5.3)
Potassium: 6 mEq/L — ABNORMAL HIGH (ref 3.7–5.3)
SODIUM: 139 meq/L (ref 137–147)
Sodium: 139 mEq/L (ref 137–147)

## 2014-03-20 ENCOUNTER — Other Ambulatory Visit: Payer: Self-pay

## 2014-04-13 ENCOUNTER — Ambulatory Visit (INDEPENDENT_AMBULATORY_CARE_PROVIDER_SITE_OTHER): Payer: Managed Care, Other (non HMO) | Admitting: Cardiovascular Disease

## 2014-04-13 ENCOUNTER — Encounter: Payer: Self-pay | Admitting: Cardiovascular Disease

## 2014-04-13 VITALS — BP 150/75 | HR 56 | Ht 72.0 in | Wt 287.0 lb

## 2014-04-13 DIAGNOSIS — I10 Essential (primary) hypertension: Secondary | ICD-10-CM

## 2014-04-13 DIAGNOSIS — N184 Chronic kidney disease, stage 4 (severe): Secondary | ICD-10-CM

## 2014-04-13 DIAGNOSIS — R079 Chest pain, unspecified: Secondary | ICD-10-CM

## 2014-04-13 DIAGNOSIS — R9431 Abnormal electrocardiogram [ECG] [EKG]: Secondary | ICD-10-CM

## 2014-04-13 DIAGNOSIS — G4733 Obstructive sleep apnea (adult) (pediatric): Secondary | ICD-10-CM

## 2014-04-13 DIAGNOSIS — R001 Bradycardia, unspecified: Secondary | ICD-10-CM

## 2014-04-13 DIAGNOSIS — Z716 Tobacco abuse counseling: Secondary | ICD-10-CM

## 2014-04-13 DIAGNOSIS — E785 Hyperlipidemia, unspecified: Secondary | ICD-10-CM

## 2014-04-13 NOTE — Patient Instructions (Signed)
Continue all current medications. Your physician wants you to follow up in: 6 months.  You will receive a reminder letter in the mail one-two months in advance.  If you don't receive a letter, please call our office to schedule the follow up appointment   

## 2014-04-13 NOTE — Progress Notes (Signed)
Patient ID: Mark [name], male   DOB: 01-15-1956, 58 y.o.   MRN: [id number]      SUBJECTIVE: The patient returns for follow-up. He underwent lumbar laminectomy and lumbosacral microdiscectomy on [id number] with no complications. I saw him for preoperative evaluation the day prior to surgery. Echocardiogram on 02/12/2014 demonstrated normal left ventricular systolic function, EF [id number], moderate LVH with grade 1 diastolic dysfunction. He was bradycardic at his last visit with me, and TSH was found to be normal. He had been complaining of chest tightness with vigorous exertion. Past medical history is significant for hypertension, type 2 diabetes mellitus, obesity, chronic kidney disease, history of tobacco abuse, untreated severe obstructive sleep apnea and hyperlipidemia. ECG on 02/06/2014 demonstrated sinus bradycardia, heart rate 49 beats per minute, with a diffuse nonspecific ST segment and T wave abnormality.  He has not had any chest pain but has also not done much by way of exertion. He has gotten in the swimming pool a little bit. He admits to white coat hypertension and says that his blood pressure usually runs in the 130/70s range. He sees a nephrologist in [place] who stopped his metformin. He previous took some Lasix for leg swelling but is no longer on this. We talked about his sleep apnea as well. He denies dizziness and syncope. His back pain has markedly improved since surgery, but his right leg falls asleep after walking for some time.   Review of Systems: As per "subjective", otherwise negative.  No Known Allergies  Current Outpatient Prescriptions  Medication Sig Dispense Refill  . ALPRAZolam (XANAX) 0.5 MG tablet Take 0.5 mg by mouth daily as needed for anxiety.    Marland Kitchen amLODipine (NORVASC) 10 MG tablet Take 10 mg by mouth at bedtime.     Marland Kitchen atenolol (TENORMIN) 50 MG tablet Take 50 mg by mouth daily as needed (for blood pressure over 130/90).    Marland Kitchen atorvastatin (LIPITOR) 40  MG tablet Take 40 mg by mouth at bedtime.    . famotidine (PEPCID) 20 MG tablet Take 20 mg by mouth daily as needed for heartburn or indigestion.    Marland Kitchen glipiZIDE (GLUCOTROL XL) 10 MG 24 hr tablet Take 10 mg by mouth daily.    Marland Kitchen HYDROcodone-acetaminophen (NORCO/VICODIN) 5-325 MG per tablet Take 1 tablet by mouth daily as needed for moderate pain.     Marland Kitchen lisinopril-hydrochlorothiazide (PRINZIDE,ZESTORETIC) 20-12.5 MG per tablet Take 1 tablet by mouth at bedtime.     . traZODone (DESYREL) 50 MG tablet Take 50 mg by mouth at bedtime as needed for sleep.      No current facility-administered medications for this visit.    Past Medical History  Diagnosis Date  . Back pain   . Hypertension   . Diabetes mellitus without complication   . Hypercholesteremia   . Anxiety     occas. panic attack, takes xanax occas  . Hematuria     being followed by Dr. Hinda Lenis for decreased kidney function   . Chronic kidney disease   . GERD (gastroesophageal reflux disease)     otc- pepcid , approx. every other  day    . Arthritis     herniated disc, lumbar   . 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 Sleep apnea     test aborted, due to not able to relax , since the aborted test he had surgery for gallbladder & he reports that he was told that he has sleep apnea  Past Surgical History  Procedure Laterality Date  . Cholecystectomy    . Tonsillectomy    . Eye surgery      cataracts remove, bilateral, w/IOL.  . Lumbar laminectomy/decompression microdiscectomy Right 02/13/2014    Procedure: RIGHT LUMBAR FOUR-FIVE, RIGHT LUMBAR FIVE- SACRAL ONE LUMBAR LAMINECTOMY/DECOMPRESSION MICRODISCECTOMY ;  Surgeon: Ashok Pall, MD;  Location: [facility];  Service: Neurosurgery;  Laterality: Right;  Right L4-5 and Right L5-S1 diskectomies    History   Social History  . Marital Status: Married    Spouse Name: N/A    Number of Children: N/A  . Years of Education: N/A   Occupational History  . Not on file.    Social History Main Topics  . Smoking status: Former Smoker -- 1.50 packs/day for 40 years    Types: Cigarettes    Start date: 10/17/1973    Quit date: 01/29/2014  . Smokeless tobacco: Never Used     Comment: haven't smoked any cigarettes in 2 weeks  . Alcohol Use: No  . Drug Use: No  . Sexual Activity: Not on file   Other Topics Concern  . Not on file   Social History Narrative     Filed Vitals:   04/13/14 1002  BP: 150/75  Pulse: 56  Height: 6' (1.829 m)  Weight: 287 lb (130.182 kg)  SpO2: 99%    PHYSICAL EXAM General: NAD HEENT: Normal. Neck: No JVD, no thyromegaly. Lungs: Clear to auscultation bilaterally with normal respiratory effort. CV: Nondisplaced PMI.  Regular rate and rhythm, normal S1/S2, no S3/S4, no murmur. No pretibial or periankle edema.  No carotid bruit.  Normal pedal pulses.  Abdomen: Soft, obese, no distention.  Neurologic: Alert and oriented x 3.  Psych: Normal affect. Skin: Normal. Musculoskeletal: No gross deformities. Extremities: No clubbing or cyanosis.   ECG: Most recent ECG reviewed.      ASSESSMENT AND PLAN: 1. Essential HTN: Elevated today but appears controlled otherwise. Will continue to monitor. Has moderate LVH with grade I diastolic dysfunction. 2. Hyperlipidemia: Currently taking Lipitor 40 mg daily. 3. Bradycardia/abnormal ECG: Possibly secondary to untreated severe obstructive sleep apnea. TSH normal on 02/12/14. Sleep study planned for early next year. 4. OSA: I strongly encouraged him to pursue a sleep study, as sleep apnea is associated with increased cardiac morbidity and mortality as well as arrhythmias and bradycardia. He plans to do so in early 2016. 5. CKD stage 4: 02/14/14-BUN 42, creat 2.49, GFR 27 ml/min. Followed by nephrology. 6. Morbid obesity: Recommend he begin swimming again, and he has tried to do so. 7. Tobacco abuse: Cessation counseling previously given. 8. Chest pain: No recurrences, but has not  exerted himself significantly. No indication for stress testing at the present time. Normal LV systolic function.  Dispo: f/u 6 months.   [name], M.D., F.A.C.C.

## 2014-11-30 ENCOUNTER — Other Ambulatory Visit: Payer: Self-pay

## 2014-12-29 ENCOUNTER — Other Ambulatory Visit: Payer: Self-pay | Admitting: *Deleted

## 2014-12-29 DIAGNOSIS — N184 Chronic kidney disease, stage 4 (severe): Secondary | ICD-10-CM

## 2014-12-29 DIAGNOSIS — Z01810 Encounter for preprocedural cardiovascular examination: Secondary | ICD-10-CM

## 2015-01-21 ENCOUNTER — Encounter: Payer: Self-pay | Admitting: Vascular Surgery

## 2015-01-22 ENCOUNTER — Ambulatory Visit (HOSPITAL_COMMUNITY)
Admission: RE | Admit: 2015-01-22 | Discharge: 2015-01-22 | Disposition: A | Discharge status: Home / Self Care | Payer: Managed Care, Other (non HMO) | Source: Ambulatory Visit | Attending: Vascular Surgery | Admitting: Vascular Surgery

## 2015-01-22 ENCOUNTER — Other Ambulatory Visit: Payer: Self-pay

## 2015-01-22 ENCOUNTER — Ambulatory Visit (INDEPENDENT_AMBULATORY_CARE_PROVIDER_SITE_OTHER): Payer: Managed Care, Other (non HMO) | Admitting: Vascular Surgery

## 2015-01-22 ENCOUNTER — Ambulatory Visit (INDEPENDENT_AMBULATORY_CARE_PROVIDER_SITE_OTHER)
Admission: RE | Admit: 2015-01-22 | Discharge: 2015-01-22 | Disposition: A | Discharge status: Home / Self Care | Payer: Managed Care, Other (non HMO) | Source: Ambulatory Visit | Attending: Vascular Surgery | Admitting: Vascular Surgery

## 2015-01-22 ENCOUNTER — Encounter: Payer: Self-pay | Admitting: Vascular Surgery

## 2015-01-22 VITALS — BP 152/62 | HR 46 | Ht 72.0 in | Wt 289.7 lb

## 2015-01-22 DIAGNOSIS — Z01810 Encounter for preprocedural cardiovascular examination: Secondary | ICD-10-CM

## 2015-01-22 DIAGNOSIS — N184 Chronic kidney disease, stage 4 (severe): Secondary | ICD-10-CM | POA: Insufficient documentation

## 2015-01-22 NOTE — Progress Notes (Signed)
Referred by:  Zella Richer. [address], [place], [place] [address]  Reason for referral: New access  History of Present Illness  Mark [name] is a 59 y.o. ([birth year]) male who presents for evaluation for permanent access.  The patient is right hand dominant.  The patient has not had previous access procedures.  Previous central venous cannulation procedures include: none.  The patient has never had a PPM placed.   Past Medical History  Diagnosis Date  . Back pain   . Hypertension   . Diabetes mellitus without complication   . Hypercholesteremia   . Anxiety     occas. panic attack, takes xanax occas  . Hematuria     being followed by Dr. Hinda Lenis for decreased kidney function   . Chronic kidney disease   . GERD (gastroesophageal reflux disease)     otc- pepcid , approx. every other  day    . Arthritis     herniated disc, lumbar   . 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 Sleep apnea     test aborted, due to not able to relax , since the aborted test he had surgery for gallbladder & he reports that he was told that he has sleep apnea     Past Surgical History  Procedure Laterality Date  . Cholecystectomy    . Tonsillectomy    . Eye surgery      cataracts remove, bilateral, w/IOL.  . Lumbar laminectomy/decompression microdiscectomy Right 02/13/2014    Procedure: RIGHT LUMBAR FOUR-FIVE, RIGHT LUMBAR FIVE- SACRAL ONE LUMBAR LAMINECTOMY/DECOMPRESSION MICRODISCECTOMY ;  Surgeon: Ashok Pall, MD;  Location: [facility];  Service: Neurosurgery;  Laterality: Right;  Right L4-5 and Right L5-S1 diskectomies    Social History   Social History  . Marital Status: Married    Spouse Name: N/A  . Number of Children: N/A  . Years of Education: N/A   Occupational History  . Not on file.   Social History Main Topics  . Smoking status: Current Every Day Smoker -- 0.50 packs/day for 40 years    Types: Cigarettes    Start date: 10/17/1973    Last Attempt to Quit:  01/29/2014  . Smokeless tobacco: Never Used  . Alcohol Use: No  . Drug Use: No  . Sexual Activity: Not on file   Other Topics Concern  . Not on file   Social History Narrative    Family History  Problem Relation Age of Onset  . Adopted: Yes  . Prostate cancer Father   . Cancer Father     prostate  . Heart failure Maternal Grandmother   . Heart attack Maternal Grandfather   . Colon cancer Maternal Grandfather     Current Outpatient Prescriptions on File Prior to Visit  Medication Sig Dispense Refill  . ALPRAZolam (XANAX) 0.5 MG tablet Take 0.5 mg by mouth daily as needed for anxiety.    Marland Kitchen amLODipine (NORVASC) 10 MG tablet Take 10 mg by mouth at bedtime.     Marland Kitchen atenolol (TENORMIN) 50 MG tablet Take 50 mg by mouth daily as needed (for blood pressure over 130/90).    Marland Kitchen atorvastatin (LIPITOR) 40 MG tablet Take 40 mg by mouth at bedtime.    . famotidine (PEPCID) 20 MG tablet Take 20 mg by mouth daily as needed for heartburn or indigestion.    Marland Kitchen glipiZIDE (GLUCOTROL XL) 10 MG 24 hr tablet Take 10 mg by mouth  daily.    Marland Kitchen HYDROcodone-acetaminophen (NORCO/VICODIN) 5-325 MG per tablet Take 1 tablet by mouth daily as needed for moderate pain.     . traZODone (DESYREL) 50 MG tablet Take 50 mg by mouth at bedtime as needed for sleep.     Marland Kitchen lisinopril-hydrochlorothiazide (PRINZIDE,ZESTORETIC) 20-12.5 MG per tablet Take 1 tablet by mouth at bedtime.      No current facility-administered medications on file prior to visit.    No Known Allergies  REVIEW OF SYSTEMS:  (Positives checked otherwise negative)  CARDIOVASCULAR:   [ ]  chest pain,  [ ]  chest pressure,  [ ]  palpitations,  [ ]  shortness of breath when laying flat,  [ ]  shortness of breath with exertion,   [x]  pain in feet when walking,  [x]  pain in feet when laying flat, [ ]  history of blood clot in veins (DVT),  [ ]  history of phlebitis,  [x]  swelling in legs,  [ ]  varicose veins  PULMONARY:   [ ]  productive cough,  [ ]   asthma,  [ ]  wheezing  NEUROLOGIC:   [ ]  weakness in arms or legs,  [x]  numbness in arms or legs,  [ ]  difficulty speaking or slurred speech,  [ ]  temporary loss of vision in one eye,  [ ]  dizziness  HEMATOLOGIC:   [ ]  bleeding problems,  [ ]  problems with blood clotting too easily  MUSCULOSKEL:   [ ]  joint pain, [ ]  joint swelling  GASTROINTEST:   [ ]  vomiting blood,  [x]  blood in stool     GENITOURINARY:   [ ]  burning with urination,  [ ]  blood in urine  PSYCHIATRIC:   [ ]  history of major depression  INTEGUMENTARY:   [ ]  rashes,  [ ]  ulcers  CONSTITUTIONAL:   [ ]  fever,  [ ]  chills   Physical Examination  Filed Vitals:   01/22/15 1027  BP: 152/62  Pulse: 46  Height: 6' (1.829 m)  Weight: 289 lb 11.2 oz (131.407 kg)  SpO2: 100%   Body mass index is 39.28 kg/(m^2).  General: A&O x 3, WD, mildly obese  Head: [place]/AT  Ear/Nose/Throat: Hearing grossly intact, nares w/o erythema or drainage, oropharynx w/o Erythema/Exudate, Mallampati score: 3  Eyes: PERRLA, EOMI  Neck: Supple, no nuchal rigidity, no palpable LAD  Pulmonary: Sym exp, good air movt, CTAB, no rales, rhonchi, & wheezing  Cardiac: RRR, Nl S1, S2, no Murmurs, rubs or gallops  Vascular: Vessel Right Left  Radial Palpable Faintly Palpable  Ulnar Not Palpable Not Palpable  Brachial Palpable Palpable  Carotid Palpable, without bruit Palpable, without bruit  Aorta Not palpable N/A  Femoral Palpable Palpable  Popliteal Not palpable Not palpable  PT Not Palpable Not Palpable  DP Palpable Palpable   Gastrointestinal: soft, NTND, -G/R, - HSM, - masses, - CVAT B  Musculoskeletal: M/S 5/5 throughout , Extremities without ischemic changes   Neurologic: CN 2-12 intact grossly, Pain and light touch intact in extremities , Motor exam as listed above  Psychiatric: Judgment intact, Mood & affect appropriate for pt's clinical situation  Dermatologic: See M/S exam for extremity exam, no rashes  otherwise noted  Lymph : No Cervical, Axillary, or Inguinal lymphadenopathy   Non-Invasive Vascular Imaging  Vein Mapping  (Date: 01/22/2015):   R arm: acceptable vein conduits include upper arm cephalic and basilic  L arm: acceptable vein conduits include marginal forearm cephalic, upper arm cephalic and baslic  BUE Doppler (Date: 01/22/2015):   R arm:   Brachial:  6.4 mm, tri  Radial: 3.0 mm, tri  Ulnar: 2.5 mm, tri  L arm:   Brachial: 5.2 mm, tri  Radial: 3.2 mm, tri  Ulnar: 3.3 mm, tri  Outside Studies/Documentation 6 pages of outside documents were reviewed including: outpatient nephrology chart.  Medical Decision Making  Mark [name] is a 59 y.o. male who presents with chronic kidney disease stage IV   Based on vein mapping and examination, this patient's permanent access options include: L RC vs BC AVF, L staged BVT, R BC AVF, R staged BVT.  I would stage with L RC vs BC AVF.  I had an extensive discussion with this patient in regards to the nature of access surgery, including risk, benefits, and alternatives.    The patient is aware that the risks of access surgery include but are not limited to: bleeding, infection, steal syndrome, nerve damage, ischemic monomelic neuropathy, failure of access to mature, and possible need for additional access procedures in the future.  The patient has agreed to proceed with the above procedure which will be scheduled: 30 AUG 16.Adele Barthel, MD [facility] Office: [phone] Pager: [phone]  01/22/2015, 12:24 PM

## 2015-02-01 ENCOUNTER — Encounter (HOSPITAL_COMMUNITY): Payer: Self-pay | Admitting: *Deleted

## 2015-02-01 MED ORDER — DEXTROSE 5 % IV SOLN
1.5000 g | INTRAVENOUS | Status: AC
  Administered 2015-02-02: 1.5 g via INTRAVENOUS
  Filled 2015-02-01: qty 1.5

## 2015-02-01 MED ORDER — SODIUM CHLORIDE 0.9 % IV SOLN
INTRAVENOUS | Status: DC
  Administered 2015-02-02: 10:00:00 via INTRAVENOUS
  Administered 2015-02-02: 10 mL/h via INTRAVENOUS

## 2015-02-01 MED ORDER — CHLORHEXIDINE GLUCONATE CLOTH 2 % EX PADS: 6.0000 | MEDICATED_PAD | Freq: Once | CUTANEOUS | Status: DC

## 2015-02-01 NOTE — Progress Notes (Signed)
Pt denies any recent chest pain or sob. States he has not done a repeat sleep study as requested by cardiologist. He does have a positive Stop Bang Assessment and it was forwarded to his PCP.

## 2015-02-01 NOTE — Progress Notes (Signed)
   02/01/15 1228  OBSTRUCTIVE SLEEP APNEA  Have you ever been diagnosed with sleep apnea through a sleep study? No  Do you snore loudly (loud enough to be heard through closed doors)?  1  Do you often feel tired, fatigued, or sleepy during the daytime? 1  Has anyone observed you stop breathing during your sleep? 1  Do you have, or are you being treated for high blood pressure? 1  BMI more than 35 kg/m2? 1  Age over 59 years old? 1  Neck circumference greater than 40 cm/16 inches? 1  Gender: 1

## 2015-02-02 ENCOUNTER — Ambulatory Visit (HOSPITAL_COMMUNITY): Payer: Managed Care, Other (non HMO) | Admitting: Anesthesiology

## 2015-02-02 ENCOUNTER — Encounter (HOSPITAL_COMMUNITY)
Admission: RE | Disposition: A | Discharge status: Home / Self Care | Payer: Self-pay | Source: Ambulatory Visit | Attending: Vascular Surgery

## 2015-02-02 ENCOUNTER — Ambulatory Visit (HOSPITAL_COMMUNITY)
Admission: RE | Admit: 2015-02-02 | Discharge: 2015-02-02 | Disposition: A | Discharge status: Home / Self Care | Payer: Managed Care, Other (non HMO) | Source: Ambulatory Visit | Attending: Vascular Surgery | Admitting: Vascular Surgery

## 2015-02-02 ENCOUNTER — Encounter (HOSPITAL_COMMUNITY): Payer: Self-pay | Admitting: Anesthesiology

## 2015-02-02 DIAGNOSIS — I120 Hypertensive chronic kidney disease with stage 5 chronic kidney disease or end stage renal disease: Secondary | ICD-10-CM | POA: Diagnosis not present

## 2015-02-02 DIAGNOSIS — F17210 Nicotine dependence, cigarettes, uncomplicated: Secondary | ICD-10-CM | POA: Insufficient documentation

## 2015-02-02 DIAGNOSIS — N185 Chronic kidney disease, stage 5: Secondary | ICD-10-CM | POA: Diagnosis present

## 2015-02-02 DIAGNOSIS — E1122 Type 2 diabetes mellitus with diabetic chronic kidney disease: Secondary | ICD-10-CM | POA: Diagnosis not present

## 2015-02-02 DIAGNOSIS — N184 Chronic kidney disease, stage 4 (severe): Secondary | ICD-10-CM | POA: Diagnosis not present

## 2015-02-02 DIAGNOSIS — F419 Anxiety disorder, unspecified: Secondary | ICD-10-CM | POA: Insufficient documentation

## 2015-02-02 DIAGNOSIS — J449 Chronic obstructive pulmonary disease, unspecified: Secondary | ICD-10-CM | POA: Insufficient documentation

## 2015-02-02 DIAGNOSIS — E780 Pure hypercholesterolemia: Secondary | ICD-10-CM | POA: Diagnosis not present

## 2015-02-02 DIAGNOSIS — Z79891 Long term (current) use of opiate analgesic: Secondary | ICD-10-CM | POA: Diagnosis not present

## 2015-02-02 DIAGNOSIS — K219 Gastro-esophageal reflux disease without esophagitis: Secondary | ICD-10-CM | POA: Insufficient documentation

## 2015-02-02 DIAGNOSIS — Z79899 Other long term (current) drug therapy: Secondary | ICD-10-CM | POA: Diagnosis not present

## 2015-02-02 HISTORY — PX: AV FISTULA PLACEMENT: SHX1204

## 2015-02-02 HISTORY — DX: Pneumonia, unspecified organism: J18.9

## 2015-02-02 LAB — GLUCOSE, CAPILLARY
GLUCOSE-CAPILLARY: 61 mg/dL — AB (ref 65–99)
GLUCOSE-CAPILLARY: 51 mg/dL — AB (ref 65–99)
GLUCOSE-CAPILLARY: 97 mg/dL (ref 65–99)
Glucose-Capillary: 86 mg/dL (ref 65–99)
Glucose-Capillary: 87 mg/dL (ref 65–99)

## 2015-02-02 LAB — POCT I-STAT 4, (NA,K, GLUC, HGB,HCT)
Glucose, Bld: 59 mg/dL — ABNORMAL LOW (ref 65–99)
HCT: 28 % — ABNORMAL LOW (ref 39.0–52.0)
Hemoglobin: 9.5 g/dL — ABNORMAL LOW (ref 13.0–17.0)
Potassium: 4.9 mmol/L (ref 3.5–5.1)
Sodium: 142 mmol/L (ref 135–145)

## 2015-02-02 SURGERY — ARTERIOVENOUS (AV) FISTULA CREATION
Anesthesia: General | Site: Arm Lower | Laterality: Left

## 2015-02-02 MED ORDER — PROPOFOL 10 MG/ML IV BOLUS
INTRAVENOUS | Status: AC
  Filled 2015-02-02: qty 20

## 2015-02-02 MED ORDER — ONDANSETRON HCL 4 MG/2ML IJ SOLN
INTRAMUSCULAR | Status: AC
  Filled 2015-02-02: qty 2

## 2015-02-02 MED ORDER — MEPERIDINE HCL 25 MG/ML IJ SOLN: 6.2500 mg | INTRAMUSCULAR | Status: DC | PRN

## 2015-02-02 MED ORDER — DEXTROSE 50 % IV SOLN
25.0000 g | Freq: Once | INTRAVENOUS | Status: AC
  Administered 2015-02-02: 25 g via INTRAVENOUS

## 2015-02-02 MED ORDER — DEXTROSE 50 % IV SOLN
INTRAVENOUS | Status: AC
  Filled 2015-02-02: qty 50

## 2015-02-02 MED ORDER — PROMETHAZINE HCL 25 MG/ML IJ SOLN: 6.2500 mg | INTRAMUSCULAR | Status: DC | PRN

## 2015-02-02 MED ORDER — FENTANYL CITRATE (PF) 100 MCG/2ML IJ SOLN: 25.0000 ug | INTRAMUSCULAR | Status: DC | PRN

## 2015-02-02 MED ORDER — SUCCINYLCHOLINE CHLORIDE 200 MG/10ML IV SOSY
PREFILLED_SYRINGE | INTRAVENOUS | Status: DC | PRN
  Administered 2015-02-02: 120 mg via INTRAVENOUS

## 2015-02-02 MED ORDER — FENTANYL CITRATE (PF) 250 MCG/5ML IJ SOLN
INTRAMUSCULAR | Status: AC
  Filled 2015-02-02: qty 5

## 2015-02-02 MED ORDER — LIDOCAINE HCL (PF) 1 % IJ SOLN
INTRAMUSCULAR | Status: AC
  Filled 2015-02-02: qty 30

## 2015-02-02 MED ORDER — THROMBIN 20000 UNITS EX SOLR
CUTANEOUS | Status: AC
  Filled 2015-02-02: qty 20000

## 2015-02-02 MED ORDER — ONDANSETRON HCL 4 MG/2ML IJ SOLN
INTRAMUSCULAR | Status: DC | PRN
Start: 2015-02-02 — End: 2015-02-02
  Administered 2015-02-02: 4 mg via INTRAVENOUS

## 2015-02-02 MED ORDER — 0.9 % SODIUM CHLORIDE (POUR BTL) OPTIME
TOPICAL | Status: DC | PRN
  Administered 2015-02-02: 1000 mL

## 2015-02-02 MED ORDER — OXYCODONE-ACETAMINOPHEN 5-325 MG PO TABS: 1.0000 | ORAL_TABLET | Freq: Four times a day (QID) | ORAL | Status: DC | PRN

## 2015-02-02 MED ORDER — MIDAZOLAM HCL 2 MG/2ML IJ SOLN
INTRAMUSCULAR | Status: AC
  Filled 2015-02-02: qty 4

## 2015-02-02 MED ORDER — MIDAZOLAM HCL 5 MG/5ML IJ SOLN
INTRAMUSCULAR | Status: DC | PRN
  Administered 2015-02-02: 2 mg via INTRAVENOUS

## 2015-02-02 MED ORDER — FENTANYL CITRATE (PF) 100 MCG/2ML IJ SOLN
INTRAMUSCULAR | Status: DC | PRN
Start: 2015-02-02 — End: 2015-02-02
  Administered 2015-02-02: 100 ug via INTRAVENOUS

## 2015-02-02 MED ORDER — LIDOCAINE HCL (CARDIAC) 20 MG/ML IV SOLN
INTRAVENOUS | Status: DC | PRN
  Administered 2015-02-02: 40 mg via INTRAVENOUS

## 2015-02-02 MED ORDER — PROPOFOL 10 MG/ML IV BOLUS
INTRAVENOUS | Status: DC | PRN
  Administered 2015-02-02: 200 mg via INTRAVENOUS

## 2015-02-02 MED ORDER — MIDAZOLAM HCL 2 MG/2ML IJ SOLN: 0.5000 mg | Freq: Once | INTRAMUSCULAR | Status: DC | PRN

## 2015-02-02 MED ORDER — SODIUM CHLORIDE 0.9 % IR SOLN
Status: DC | PRN
  Administered 2015-02-02: 500 mL

## 2015-02-02 SURGICAL SUPPLY — 36 items
ARMBAND PINK RESTRICT EXTREMIT (MISCELLANEOUS) ×2 IMPLANT
CANISTER SUCTION 2500CC (MISCELLANEOUS) ×2 IMPLANT
CLIP TI MEDIUM 6 (CLIP) ×2 IMPLANT
CLIP TI WIDE RED SMALL 6 (CLIP) ×2 IMPLANT
COVER PROBE W GEL 5X96 (DRAPES) ×2 IMPLANT
DECANTER SPIKE VIAL GLASS SM (MISCELLANEOUS) ×2 IMPLANT
ELECT REM PT RETURN 9FT ADLT (ELECTROSURGICAL) ×2
ELECTRODE REM PT RTRN 9FT ADLT (ELECTROSURGICAL) ×1 IMPLANT
GEL ULTRASOUND 20GR AQUASONIC (MISCELLANEOUS) ×1 IMPLANT
GLOVE BIO SURGEON STRL SZ7 (GLOVE) ×2 IMPLANT
GLOVE BIOGEL PI IND STRL 6.5 (GLOVE) IMPLANT
GLOVE BIOGEL PI IND STRL 7.0 (GLOVE) IMPLANT
GLOVE BIOGEL PI IND STRL 7.5 (GLOVE) ×1 IMPLANT
GLOVE BIOGEL PI INDICATOR 6.5 (GLOVE) ×1
GLOVE BIOGEL PI INDICATOR 7.0 (GLOVE) ×1
GLOVE BIOGEL PI INDICATOR 7.5 (GLOVE) ×2
GLOVE ECLIPSE 7.0 STRL STRAW (GLOVE) ×1 IMPLANT
GLOVE SURG SS PI 7.0 STRL IVOR (GLOVE) ×1 IMPLANT
GOWN STRL REUS W/ TWL LRG LVL3 (GOWN DISPOSABLE) ×3 IMPLANT
GOWN STRL REUS W/ TWL XL LVL3 (GOWN DISPOSABLE) IMPLANT
GOWN STRL REUS W/TWL LRG LVL3 (GOWN DISPOSABLE) ×4
GOWN STRL REUS W/TWL XL LVL3 (GOWN DISPOSABLE) ×2
KIT BASIN OR (CUSTOM PROCEDURE TRAY) ×2 IMPLANT
KIT ROOM TURNOVER OR (KITS) ×2 IMPLANT
LIQUID BAND (GAUZE/BANDAGES/DRESSINGS) ×2 IMPLANT
NS IRRIG 1000ML POUR BTL (IV SOLUTION) ×2 IMPLANT
PACK CV ACCESS (CUSTOM PROCEDURE TRAY) ×2 IMPLANT
PAD ARMBOARD 7.5X6 YLW CONV (MISCELLANEOUS) ×4 IMPLANT
SPONGE SURGIFOAM ABS GEL 100 (HEMOSTASIS) IMPLANT
SUT MNCRL AB 4-0 PS2 18 (SUTURE) ×3 IMPLANT
SUT PROLENE 6 0 BV (SUTURE) IMPLANT
SUT PROLENE 7 0 BV 1 (SUTURE) ×3 IMPLANT
SUT VIC AB 3-0 SH 27 (SUTURE) ×4
SUT VIC AB 3-0 SH 27X BRD (SUTURE) ×1 IMPLANT
UNDERPAD 30X30 INCONTINENT (UNDERPADS AND DIAPERS) ×2 IMPLANT
WATER STERILE IRR 1000ML POUR (IV SOLUTION) ×2 IMPLANT

## 2015-02-02 NOTE — Anesthesia Procedure Notes (Addendum)
Procedure Name: LMA Insertion Date/Time: 02/02/2015 10:33 AM Performed by: Manus Gunning, Ezzie Senat J Pre-anesthesia Checklist: Patient identified, Timeout performed, Emergency Drugs available, Suction available and Patient being monitored Patient Re-evaluated:Patient Re-evaluated prior to inductionOxygen Delivery Method: Circle system utilized Preoxygenation: Pre-oxygenation with 100% oxygen Intubation Type: IV induction Ventilation: Mask ventilation without difficulty LMA: LMA inserted LMA Size: 4.5 Number of attempts: 1 Placement Confirmation: positive ETCO2 and breath sounds checked- equal and bilateral Tube secured with: Tape Dental Injury: Teeth and Oropharynx as per pre-operative assessment    Procedure Name: Intubation Date/Time: 02/02/2015 10:41 AM Performed by: Manus Gunning, Huntley Knoop J Pre-anesthesia Checklist: Patient identified, Timeout performed, Emergency Drugs available, Suction available and Patient being monitored Patient Re-evaluated:Patient Re-evaluated prior to inductionOxygen Delivery Method: Circle system utilized Preoxygenation: Pre-oxygenation with 100% oxygen Intubation Type: IV induction Ventilation: Mask ventilation without difficulty Laryngoscope Size: Mac and 4 Grade View: Grade I Tube type: Oral Tube size: 7.5 mm Number of attempts: 1 Placement Confirmation: ETT inserted through vocal cords under direct vision,  positive ETCO2 and breath sounds checked- equal and bilateral Secured at: 21 cm Tube secured with: Tape Dental Injury: Teeth and Oropharynx as per pre-operative assessment

## 2015-02-02 NOTE — Addendum Note (Signed)
Addendum  created 02/02/15 1612 by Purvis Kilts, CRNA   Modules edited: Anesthesia Attestations

## 2015-02-02 NOTE — Anesthesia Preprocedure Evaluation (Addendum)
Anesthesia Evaluation  Patient identified by MRN, date of birth, ID band Patient awake    Reviewed: Allergy & Precautions, NPO status , Patient's Chart, lab work & pertinent test results  History of Anesthesia Complications Negative for: history of anesthetic complications  Airway Mallampati: II  TM Distance: >3 FB Neck ROM: Full    Dental  (+) Missing, Chipped, Dental Advisory Given   Pulmonary sleep apnea (presumed, no CPAP as was unable to complete study) , COPD COPD inhaler, Current Smoker,  breath sounds clear to auscultation        Cardiovascular hypertension, Pt. on medications and Pt. on home beta blockers + angina Rhythm:Regular Rate:Normal  '15 ECHO: EF 55%, valves OK   Neuro/Psych Chronic back pain: narcotics    GI/Hepatic Neg liver ROS, GERD-  Poorly Controlled,  Endo/Other  diabetes (glu 61), Oral Hypoglycemic AgentsMorbid obesity  Renal/GU CRFRenal disease (K+ 4.9)     Musculoskeletal   Abdominal (+) + obese,   Peds  Hematology   Anesthesia Other Findings   Reproductive/Obstetrics                           Anesthesia Physical Anesthesia Plan  ASA: III  Anesthesia Plan: General   Post-op Pain Management:    Induction: Intravenous  Airway Management Planned: LMA  Additional Equipment:   Intra-op Plan:   Post-operative Plan:   Informed Consent: I have reviewed the patients History and Physical, chart, labs and discussed the procedure including the risks, benefits and alternatives for the proposed anesthesia with the patient or authorized representative who has indicated his/her understanding and acceptance.   Dental advisory given  Plan Discussed with: CRNA and Surgeon  Anesthesia Plan Comments: (Plan routine monitors, GA- LMA OK)        Anesthesia Quick Evaluation

## 2015-02-02 NOTE — Transfer of Care (Signed)
Immediate Anesthesia Transfer of Care Note  Patient: Mark [name]  Procedure(s) Performed: Procedure(s): LEFT ARM RADIOCEPHALIC ARTERIOVENOUS (AV) FISTULA CREATION (Left)  Patient Location: PACU  Anesthesia Type:General  Level of Consciousness: awake  Airway & Oxygen Therapy: Patient Spontanous Breathing and Patient connected to face mask oxygen  Post-op Assessment: Report given to RN and Post -op Vital signs reviewed and stable  Post vital signs: Reviewed and stable  Last Vitals:  Filed Vitals:   02/02/15 0801  BP:   Pulse:   Temp:   Resp: 16    Complications: No apparent anesthesia complications

## 2015-02-02 NOTE — Anesthesia Postprocedure Evaluation (Signed)
  Anesthesia Post-op Note  Patient: Mark [name]  Procedure(s) Performed: Procedure(s): LEFT ARM RADIOCEPHALIC ARTERIOVENOUS (AV) FISTULA CREATION (Left)  Patient Location: PACU  Anesthesia Type:General  Level of Consciousness: awake, alert , oriented and patient cooperative  Airway and Oxygen Therapy: Patient Spontanous Breathing  Post-op Pain: none  Post-op Assessment: Post-op Vital signs reviewed, Patient's Cardiovascular Status Stable, Respiratory Function Stable, Patent Airway, No signs of Nausea or vomiting and Pain level controlled              Post-op Vital Signs: Reviewed and stable  Last Vitals:  Filed Vitals:   02/02/15 1408  BP: 130/49  Pulse:   Temp:   Resp:     Complications: No apparent anesthesia complications

## 2015-02-02 NOTE — H&P (View-Only) (Signed)
Referred by:  Zella Richer. [address], [place], [place] [address]  Reason for referral: New access  History of Present Illness  Mark [name] is a 59 y.o. ([birth year]) male who presents for evaluation for permanent access.  The patient is right hand dominant.  The patient has not had previous access procedures.  Previous central venous cannulation procedures include: none.  The patient has never had a PPM placed.   Past Medical History  Diagnosis Date  . Back pain   . Hypertension   . Diabetes mellitus without complication   . Hypercholesteremia   . Anxiety     occas. panic attack, takes xanax occas  . Hematuria     being followed by Dr. Hinda Lenis for decreased kidney function   . Chronic kidney disease   . GERD (gastroesophageal reflux disease)     otc- pepcid , approx. every other  day    . Arthritis     herniated disc, lumbar   . 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 Sleep apnea     test aborted, due to not able to relax , since the aborted test he had surgery for gallbladder & he reports that he was told that he has sleep apnea     Past Surgical History  Procedure Laterality Date  . Cholecystectomy    . Tonsillectomy    . Eye surgery      cataracts remove, bilateral, w/IOL.  . Lumbar laminectomy/decompression microdiscectomy Right 02/13/2014    Procedure: RIGHT LUMBAR FOUR-FIVE, RIGHT LUMBAR FIVE- SACRAL ONE LUMBAR LAMINECTOMY/DECOMPRESSION MICRODISCECTOMY ;  Surgeon: Ashok Pall, MD;  Location: [facility];  Service: Neurosurgery;  Laterality: Right;  Right L4-5 and Right L5-S1 diskectomies    Social History   Social History  . Marital Status: Married    Spouse Name: N/A  . Number of Children: N/A  . Years of Education: N/A   Occupational History  . Not on file.   Social History Main Topics  . Smoking status: Current Every Day Smoker -- 0.50 packs/day for 40 years    Types: Cigarettes    Start date: 10/17/1973    Last Attempt to Quit:  01/29/2014  . Smokeless tobacco: Never Used  . Alcohol Use: No  . Drug Use: No  . Sexual Activity: Not on file   Other Topics Concern  . Not on file   Social History Narrative    Family History  Problem Relation Age of Onset  . Adopted: Yes  . Prostate cancer Father   . Cancer Father     prostate  . Heart failure Maternal Grandmother   . Heart attack Maternal Grandfather   . Colon cancer Maternal Grandfather     Current Outpatient Prescriptions on File Prior to Visit  Medication Sig Dispense Refill  . ALPRAZolam (XANAX) 0.5 MG tablet Take 0.5 mg by mouth daily as needed for anxiety.    Marland Kitchen amLODipine (NORVASC) 10 MG tablet Take 10 mg by mouth at bedtime.     Marland Kitchen atenolol (TENORMIN) 50 MG tablet Take 50 mg by mouth daily as needed (for blood pressure over 130/90).    Marland Kitchen atorvastatin (LIPITOR) 40 MG tablet Take 40 mg by mouth at bedtime.    . famotidine (PEPCID) 20 MG tablet Take 20 mg by mouth daily as needed for heartburn or indigestion.    Marland Kitchen glipiZIDE (GLUCOTROL XL) 10 MG 24 hr tablet Take 10 mg by mouth  daily.    Marland Kitchen HYDROcodone-acetaminophen (NORCO/VICODIN) 5-325 MG per tablet Take 1 tablet by mouth daily as needed for moderate pain.     . traZODone (DESYREL) 50 MG tablet Take 50 mg by mouth at bedtime as needed for sleep.     Marland Kitchen lisinopril-hydrochlorothiazide (PRINZIDE,ZESTORETIC) 20-12.5 MG per tablet Take 1 tablet by mouth at bedtime.      No current facility-administered medications on file prior to visit.    No Known Allergies  REVIEW OF SYSTEMS:  (Positives checked otherwise negative)  CARDIOVASCULAR:   [ ]  chest pain,  [ ]  chest pressure,  [ ]  palpitations,  [ ]  shortness of breath when laying flat,  [ ]  shortness of breath with exertion,   [x]  pain in feet when walking,  [x]  pain in feet when laying flat, [ ]  history of blood clot in veins (DVT),  [ ]  history of phlebitis,  [x]  swelling in legs,  [ ]  varicose veins  PULMONARY:   [ ]  productive cough,  [ ]   asthma,  [ ]  wheezing  NEUROLOGIC:   [ ]  weakness in arms or legs,  [x]  numbness in arms or legs,  [ ]  difficulty speaking or slurred speech,  [ ]  temporary loss of vision in one eye,  [ ]  dizziness  HEMATOLOGIC:   [ ]  bleeding problems,  [ ]  problems with blood clotting too easily  MUSCULOSKEL:   [ ]  joint pain, [ ]  joint swelling  GASTROINTEST:   [ ]  vomiting blood,  [x]  blood in stool     GENITOURINARY:   [ ]  burning with urination,  [ ]  blood in urine  PSYCHIATRIC:   [ ]  history of major depression  INTEGUMENTARY:   [ ]  rashes,  [ ]  ulcers  CONSTITUTIONAL:   [ ]  fever,  [ ]  chills   Physical Examination  Filed Vitals:   01/22/15 1027  BP: 152/62  Pulse: 46  Height: 6' (1.829 m)  Weight: 289 lb 11.2 oz (131.407 kg)  SpO2: 100%   Body mass index is 39.28 kg/(m^2).  General: A&O x 3, WD, mildly obese  Head: [place]/AT  Ear/Nose/Throat: Hearing grossly intact, nares w/o erythema or drainage, oropharynx w/o Erythema/Exudate, Mallampati score: 3  Eyes: PERRLA, EOMI  Neck: Supple, no nuchal rigidity, no palpable LAD  Pulmonary: Sym exp, good air movt, CTAB, no rales, rhonchi, & wheezing  Cardiac: RRR, Nl S1, S2, no Murmurs, rubs or gallops  Vascular: Vessel Right Left  Radial Palpable Faintly Palpable  Ulnar Not Palpable Not Palpable  Brachial Palpable Palpable  Carotid Palpable, without bruit Palpable, without bruit  Aorta Not palpable N/A  Femoral Palpable Palpable  Popliteal Not palpable Not palpable  PT Not Palpable Not Palpable  DP Palpable Palpable   Gastrointestinal: soft, NTND, -G/R, - HSM, - masses, - CVAT B  Musculoskeletal: M/S 5/5 throughout , Extremities without ischemic changes   Neurologic: CN 2-12 intact grossly, Pain and light touch intact in extremities , Motor exam as listed above  Psychiatric: Judgment intact, Mood & affect appropriate for pt's clinical situation  Dermatologic: See M/S exam for extremity exam, no rashes  otherwise noted  Lymph : No Cervical, Axillary, or Inguinal lymphadenopathy   Non-Invasive Vascular Imaging  Vein Mapping  (Date: 01/22/2015):   R arm: acceptable vein conduits include upper arm cephalic and basilic  L arm: acceptable vein conduits include marginal forearm cephalic, upper arm cephalic and baslic  BUE Doppler (Date: 01/22/2015):   R arm:   Brachial:  6.4 mm, tri  Radial: 3.0 mm, tri  Ulnar: 2.5 mm, tri  L arm:   Brachial: 5.2 mm, tri  Radial: 3.2 mm, tri  Ulnar: 3.3 mm, tri  Outside Studies/Documentation 6 pages of outside documents were reviewed including: outpatient nephrology chart.  Medical Decision Making  Amado Richcreek is a 59 y.o. male who presents with chronic kidney disease stage IV   Based on vein mapping and examination, this patient's permanent access options include: L RC vs BC AVF, L staged BVT, R BC AVF, R staged BVT.  I would stage with L RC vs BC AVF.  I had an extensive discussion with this patient in regards to the nature of access surgery, including risk, benefits, and alternatives.    The patient is aware that the risks of access surgery include but are not limited to: bleeding, infection, steal syndrome, nerve damage, ischemic monomelic neuropathy, failure of access to mature, and possible need for additional access procedures in the future.  The patient has agreed to proceed with the above procedure which will be scheduled: 30 AUG 16.Adele Barthel, MD [facility] Office: [phone] Pager: [phone]  01/22/2015, 12:24 PM

## 2015-02-02 NOTE — Interval H&P Note (Signed)
History and Physical Interval Note:  02/02/2015 7:40 AM  Mark [name]  has presented today for surgery, with the diagnosis of Stage IV Chronic Kidney Disease N18.4  The various methods of treatment have been discussed with the patient and family. After consideration of risks, benefits and other options for treatment, the patient has consented to  Procedure(s): RADIOCEPHALIC VERSUS BRACHIOCEPHALIC ARTERIOVENOUS (AV) FISTULA CREATION (Left) as a surgical intervention .  The patient's history has been reviewed, patient examined, no change in status, stable for surgery.  I have reviewed the patient's chart and labs.  Questions were answered to the patient's satisfaction.     Adele Barthel

## 2015-02-02 NOTE — Op Note (Signed)
OPERATIVE NOTE   PROCEDURE: left radiocephaic arteriovenous fistula placement  PRE-OPERATIVE DIAGNOSIS: chronic kidney disease stage IV-V   POST-OPERATIVE DIAGNOSIS: same as above   SURGEON: Adele Barthel, MD  ASSISTANT(S): Gerri Lins, [facility]   ANESTHESIA: general  ESTIMATED BLOOD LOSS: 50 cc  FINDING(S): 1.  Adequate cephalic vein: 3 mm 2.  Small radial artery 2.5 mm 3.  Dopplerable flow in fistula at end of case without strong thrill  SPECIMEN(S):  none  INDICATIONS:   Mark [name] is a 59 y.o. male who presents with chronic kidney disease stage IV-V.  The patient is scheduled for left radiocephalic arteriovenous fistula placement.  The patient is aware the risks include but are not limited to: bleeding, infection, steal syndrome, nerve damage, ischemic monomelic neuropathy, failure to mature, and need for additional procedures.  The patient is aware of the risks of the procedure and elects to proceed forward.  DESCRIPTION: After full informed written consent was obtained from the patient, the patient was brought back to the operating room and placed supine upon the operating table.  Prior to induction, the patient received IV antibiotics.   After obtaining adequate anesthesia, the patient was then prepped and draped in the standard fashion for a left arm access procedure.  I turned my attention first to identifying the patient's distal cephalic vein and radial artery.  Using SonoSite guidance, the location of these vessels were marked out on the skin.   I made a longitudinal incision over the cephalic vein at the level of the wrist and dissected out this segment of the vein, which appeared to be 3 mm externally.  I then made a longitudinal incision over the radial artery.   I dissected through the subcutaneous tissue and fascia to gain exposure of the radial artery.  This was noted to be 2.5 mm in diameter externally.  This was dissected out proximally and distally and  controlled with vessel loops.  The distal segment of the vein was ligated with a  2-0 silk, and the vein was transected.  The proximal segment was interrogated with serial dilators.  The vein accepted up to a 3.5 mm dilator without any difficulty.  I then instilled the heparinized saline into the vein and clamped it.  At this point, I reset my exposure of the radial artery and placed the artery under tension proximally and distally.  I made an arteriotomy with a #11 blade, and then I extended the arteriotomy with a Potts scissor.  I injected heparinized saline proximal and distal to this arteriotomy.  The vein was then sewn to the artery in an end-to-side configuration with a running stitch of 7-0 Prolene.  Prior to completing this anastomosis, I allowed the vein and artery to backbleed.  There was no evidence of clot from any vessels.  I completed the anastomosis in the usual fashion and then released all vessel loops and clamps.  There was not a palpable thrill in the venous outflow, but there was dopplerable signal in the fistula.  Distally there was dopplerable radial signal.  At this point, I irrigated out the surgical wound.  There was no further active bleeding.  The subcutaneous tissue was reapproximated in each incisions  with a running stitch of 3-0 Vicryl.  The skin in both incisions was then reapproximated with a running subcuticular stitch of 4-0 Vicryl.  The skin was then cleaned, dried, and reinforced with Dermabond.  The patient tolerated this procedure well.    COMPLICATIONS: none  CONDITION: stable   Adele Barthel, MD [facility] Office: [phone] Pager: [phone]  02/02/2015, 11:50 AM

## 2015-02-02 NOTE — Progress Notes (Signed)
Notified Dr. Glennon Mac of CBG 61, patient asymptomatic.  No orders given at this time, will continue to monitor CBG hourly and asked patient to let us know if he starts feeling bad.

## 2015-02-03 ENCOUNTER — Encounter (HOSPITAL_COMMUNITY): Payer: Self-pay | Admitting: Vascular Surgery

## 2015-02-05 ENCOUNTER — Telehealth: Payer: Self-pay | Admitting: Vascular Surgery

## 2015-02-05 NOTE — Telephone Encounter (Addendum)
-----   Message from Mena Goes, RN sent at 02/02/2015  1:31 PM EDT ----- Regarding: schedule   ----- Message -----    From: Ulyses Amor, PA-C    Sent: 02/02/2015  11:59 AM      To: Vvs Charge Pool  F/U in 6 weeks with Dr. Bridgett Larsson s/p forearm av fistula no study needed  notified patient's wife of post op appt. on 03-26-15 8:30 am with dr. Bridgett Larsson

## 2015-02-12 ENCOUNTER — Inpatient Hospital Stay (HOSPITAL_COMMUNITY): Payer: Managed Care, Other (non HMO)

## 2015-02-12 ENCOUNTER — Inpatient Hospital Stay (HOSPITAL_COMMUNITY): Admission: EM | Admit: 2015-02-12 | Payer: Managed Care, Other (non HMO) | Admitting: Emergency Medicine

## 2015-02-12 ENCOUNTER — Inpatient Hospital Stay (HOSPITAL_COMMUNITY)
Admission: AD | Admit: 2015-02-12 | Discharge: 2015-02-15 | DRG: 291 | Disposition: A | Discharge status: Home / Self Care | Payer: Managed Care, Other (non HMO) | Source: Other Acute Inpatient Hospital | Attending: Internal Medicine | Admitting: Internal Medicine

## 2015-02-12 DIAGNOSIS — J9601 Acute respiratory failure with hypoxia: Secondary | ICD-10-CM | POA: Diagnosis present

## 2015-02-12 DIAGNOSIS — G4733 Obstructive sleep apnea (adult) (pediatric): Secondary | ICD-10-CM | POA: Diagnosis present

## 2015-02-12 DIAGNOSIS — K219 Gastro-esophageal reflux disease without esophagitis: Secondary | ICD-10-CM | POA: Diagnosis present

## 2015-02-12 DIAGNOSIS — I120 Hypertensive chronic kidney disease with stage 5 chronic kidney disease or end stage renal disease: Secondary | ICD-10-CM | POA: Diagnosis present

## 2015-02-12 DIAGNOSIS — E875 Hyperkalemia: Secondary | ICD-10-CM | POA: Diagnosis present

## 2015-02-12 DIAGNOSIS — Z85828 Personal history of other malignant neoplasm of skin: Secondary | ICD-10-CM | POA: Diagnosis not present

## 2015-02-12 DIAGNOSIS — D6489 Other specified anemias: Secondary | ICD-10-CM | POA: Diagnosis present

## 2015-02-12 DIAGNOSIS — E1122 Type 2 diabetes mellitus with diabetic chronic kidney disease: Secondary | ICD-10-CM | POA: Diagnosis present

## 2015-02-12 DIAGNOSIS — R9431 Abnormal electrocardiogram [ECG] [EKG]: Secondary | ICD-10-CM

## 2015-02-12 DIAGNOSIS — N179 Acute kidney failure, unspecified: Secondary | ICD-10-CM | POA: Diagnosis present

## 2015-02-12 DIAGNOSIS — E780 Pure hypercholesterolemia: Secondary | ICD-10-CM | POA: Diagnosis present

## 2015-02-12 DIAGNOSIS — E669 Obesity, unspecified: Secondary | ICD-10-CM | POA: Diagnosis present

## 2015-02-12 DIAGNOSIS — N189 Chronic kidney disease, unspecified: Secondary | ICD-10-CM | POA: Diagnosis not present

## 2015-02-12 DIAGNOSIS — M1990 Unspecified osteoarthritis, unspecified site: Secondary | ICD-10-CM | POA: Diagnosis present

## 2015-02-12 DIAGNOSIS — J9600 Acute respiratory failure, unspecified whether with hypoxia or hypercapnia: Secondary | ICD-10-CM | POA: Diagnosis present

## 2015-02-12 DIAGNOSIS — I5043 Acute on chronic combined systolic (congestive) and diastolic (congestive) heart failure: Secondary | ICD-10-CM | POA: Diagnosis present

## 2015-02-12 DIAGNOSIS — E785 Hyperlipidemia, unspecified: Secondary | ICD-10-CM | POA: Diagnosis present

## 2015-02-12 DIAGNOSIS — I10 Essential (primary) hypertension: Secondary | ICD-10-CM | POA: Insufficient documentation

## 2015-02-12 DIAGNOSIS — Z6837 Body mass index (BMI) 37.0-37.9, adult: Secondary | ICD-10-CM | POA: Diagnosis not present

## 2015-02-12 DIAGNOSIS — N19 Unspecified kidney failure: Secondary | ICD-10-CM | POA: Diagnosis not present

## 2015-02-12 DIAGNOSIS — R0600 Dyspnea, unspecified: Secondary | ICD-10-CM

## 2015-02-12 DIAGNOSIS — N185 Chronic kidney disease, stage 5: Secondary | ICD-10-CM | POA: Diagnosis present

## 2015-02-12 DIAGNOSIS — F17210 Nicotine dependence, cigarettes, uncomplicated: Secondary | ICD-10-CM | POA: Diagnosis present

## 2015-02-12 DIAGNOSIS — I5021 Acute systolic (congestive) heart failure: Secondary | ICD-10-CM | POA: Diagnosis not present

## 2015-02-12 LAB — BASIC METABOLIC PANEL
Anion gap: 10 (ref 5–15)
BUN: 68 mg/dL — ABNORMAL HIGH (ref 6–20)
CALCIUM: 7.8 mg/dL — AB (ref 8.9–10.3)
CHLORIDE: 109 mmol/L (ref 101–111)
CO2: 23 mmol/L (ref 22–32)
CREATININE: 6.42 mg/dL — AB (ref 0.61–1.24)
GFR calc non Af Amer: 8 mL/min — ABNORMAL LOW (ref >60)
GFR, EST AFRICAN AMERICAN: 10 mL/min — AB (ref >60)
GLUCOSE: 104 mg/dL — AB (ref 65–99)
Potassium: 5 mmol/L (ref 3.5–5.1)
Sodium: 142 mmol/L (ref 135–145)

## 2015-02-12 LAB — CBC
HCT: 27.2 % — ABNORMAL LOW (ref 39.0–52.0)
HEMOGLOBIN: 8.8 g/dL — AB (ref 13.0–17.0)
MCH: 29 pg (ref 26.0–34.0)
MCHC: 32.4 g/dL (ref 30.0–36.0)
MCV: 89.8 fL (ref 78.0–100.0)
Platelets: 145 10*3/uL — ABNORMAL LOW (ref 150–400)
RBC: 3.03 MIL/uL — ABNORMAL LOW (ref 4.22–5.81)
RDW: 13.8 % (ref 11.5–15.5)
WBC: 6.5 10*3/uL (ref 4.0–10.5)

## 2015-02-12 LAB — TYPE AND SCREEN
ABO/RH(D): B POS
ANTIBODY SCREEN: NEGATIVE

## 2015-02-12 LAB — COMPREHENSIVE METABOLIC PANEL
ALT: 18 U/L (ref 17–63)
ANION GAP: 11 (ref 5–15)
AST: 17 U/L (ref 15–41)
Albumin: 2.9 g/dL — ABNORMAL LOW (ref 3.5–5.0)
Alkaline Phosphatase: 77 U/L (ref 38–126)
BUN: 69 mg/dL — ABNORMAL HIGH (ref 6–20)
CHLORIDE: 110 mmol/L (ref 101–111)
CO2: 20 mmol/L — AB (ref 22–32)
CREATININE: 6.48 mg/dL — AB (ref 0.61–1.24)
Calcium: 7.7 mg/dL — ABNORMAL LOW (ref 8.9–10.3)
GFR, EST AFRICAN AMERICAN: 10 mL/min — AB (ref >60)
GFR, EST NON AFRICAN AMERICAN: 8 mL/min — AB (ref >60)
Glucose, Bld: 71 mg/dL (ref 65–99)
Potassium: 5.3 mmol/L — ABNORMAL HIGH (ref 3.5–5.1)
SODIUM: 141 mmol/L (ref 135–145)
Total Bilirubin: 0.5 mg/dL (ref 0.3–1.2)
Total Protein: 6.1 g/dL — ABNORMAL LOW (ref 6.5–8.1)

## 2015-02-12 LAB — TROPONIN I: Troponin I: <0.03 ng/mL (ref <0.031)

## 2015-02-12 LAB — GLUCOSE, CAPILLARY
GLUCOSE-CAPILLARY: 64 mg/dL — AB (ref 65–99)
GLUCOSE-CAPILLARY: 70 mg/dL (ref 65–99)
Glucose-Capillary: 61 mg/dL — ABNORMAL LOW (ref 65–99)
Glucose-Capillary: 102 mg/dL — ABNORMAL HIGH (ref 65–99)

## 2015-02-12 LAB — PHOSPHORUS: PHOSPHORUS: 8.5 mg/dL — AB (ref 2.5–4.6)

## 2015-02-12 LAB — MAGNESIUM: MAGNESIUM: 2 mg/dL (ref 1.7–2.4)

## 2015-02-12 LAB — BRAIN NATRIURETIC PEPTIDE: B Natriuretic Peptide: 763.9 pg/mL — ABNORMAL HIGH (ref 0.0–100.0)

## 2015-02-12 LAB — MRSA PCR SCREENING: MRSA by PCR: NEGATIVE

## 2015-02-12 MED ORDER — HEPARIN SODIUM (PORCINE) 5000 UNIT/ML IJ SOLN: 5000.0000 [IU] | Freq: Three times a day (TID) | INTRAMUSCULAR | Status: DC

## 2015-02-12 MED ORDER — ALBUTEROL SULFATE 108 (90 BASE) MCG/ACT IN AEPB: 2.0000 | INHALATION_SPRAY | RESPIRATORY_TRACT | Status: DC | PRN

## 2015-02-12 MED ORDER — OXYCODONE-ACETAMINOPHEN 5-325 MG PO TABS
1.0000 | ORAL_TABLET | Freq: Four times a day (QID) | ORAL | Status: DC | PRN
Start: 2015-02-12 — End: 2015-02-15
  Administered 2015-02-12 – 2015-02-14 (×7): 1 via ORAL
  Filled 2015-02-12 (×7): qty 1

## 2015-02-12 MED ORDER — AMLODIPINE BESYLATE 5 MG PO TABS
5.0000 mg | ORAL_TABLET | Freq: Every day | ORAL | Status: DC
  Administered 2015-02-12 – 2015-02-15 (×4): 5 mg via ORAL
  Filled 2015-02-12 (×4): qty 1

## 2015-02-12 MED ORDER — ALBUTEROL SULFATE (2.5 MG/3ML) 0.083% IN NEBU: 2.5000 mg | INHALATION_SOLUTION | RESPIRATORY_TRACT | Status: DC | PRN

## 2015-02-12 MED ORDER — FUROSEMIDE 10 MG/ML IJ SOLN
40.0000 mg | Freq: Once | INTRAMUSCULAR | Status: AC
  Administered 2015-02-12: 40 mg via INTRAVENOUS
  Filled 2015-02-12: qty 4

## 2015-02-12 MED ORDER — INSULIN ASPART 100 UNIT/ML ~~LOC~~ SOLN
2.0000 [IU] | SUBCUTANEOUS | Status: DC
  Administered 2015-02-12: 2 [IU] via SUBCUTANEOUS

## 2015-02-12 MED ORDER — ALPRAZOLAM 0.5 MG PO TABS
0.5000 mg | ORAL_TABLET | Freq: Two times a day (BID) | ORAL | Status: DC | PRN
  Administered 2015-02-13 – 2015-02-14 (×5): 0.5 mg via ORAL
  Filled 2015-02-12 (×6): qty 1

## 2015-02-12 MED ORDER — PANTOPRAZOLE SODIUM 40 MG PO TBEC
40.0000 mg | DELAYED_RELEASE_TABLET | Freq: Every day | ORAL | Status: DC
  Administered 2015-02-12 – 2015-02-15 (×4): 40 mg via ORAL
  Filled 2015-02-12 (×4): qty 1

## 2015-02-12 MED ORDER — ATORVASTATIN CALCIUM 40 MG PO TABS
40.0000 mg | ORAL_TABLET | Freq: Every day | ORAL | Status: DC
  Administered 2015-02-12 – 2015-02-14 (×3): 40 mg via ORAL
  Filled 2015-02-12 (×3): qty 1

## 2015-02-12 MED ORDER — NICOTINE 21 MG/24HR TD PT24
21.0000 mg | MEDICATED_PATCH | Freq: Every day | TRANSDERMAL | Status: DC
  Administered 2015-02-12 – 2015-02-15 (×4): 21 mg via TRANSDERMAL
  Filled 2015-02-12 (×4): qty 1

## 2015-02-12 NOTE — Progress Notes (Signed)
eLink Physician-Brief Progress Note Patient Name: Mark [name] DOB: 09-30-1955 MRN: [id number]   Date of Service  02/12/2015  HPI/Events of Note  Patient requests nicotine patch and c/o anxiety.   eICU Interventions  Will order: 1. Nicotine Patch 21 mg Q day. 2. Xanax 0.5 mg PO Q 12 hours PRN.      Intervention Category Minor Interventions: Agitation / anxiety - evaluation and management;Routine modifications to care plan (e.g. PRN medications for pain, fever)  Sommer,Steven Eugene 02/12/2015, 4:51 PM

## 2015-02-12 NOTE — Progress Notes (Signed)
Pt stated his breathing was fine, vital signs within normal range and stated he did not want to wear BiPAP tonight. RT informed pt if he changed his mind to call for RT

## 2015-02-12 NOTE — H&P (Signed)
PULMONARY / CRITICAL CARE MEDICINE   Name: Mark [name] MRN: [id number] DOB: 1955/10/08    ADMISSION DATE:  02/12/2015   REFERRING MD :  Corena Pilgrim edp  CHIEF COMPLAINT:  SOB  INITIAL PRESENTATION: Sob , heartburn  STUDIES:  9/9 renal us>> 9/9 2 d >>  SIGNIFICANT EVENTS:    HISTORY OF PRESENT ILLNESS:   59 yo MO WM smoker who complained of heart burn and sob for several days with increased sxs 9/9 and was taken taken to [facility] hospital. Placed on bipap and heparin drip plus diuresed with lasix and transported to [facility] ICU. He has untreated OSA and does not wear cpap. On arrival to ICU, bipap was discontinued and heparin drip was discontinued. He has CRI with creatine known >6.0. Left AV wrist fistula placed 2 weeks ago per renal service request. PCCM will admit, repeat 12 lead EKG, follow up elevated K+ treated at [facility] with kayexalate along with D50 and insulin. PCCM will admit and follow for now. He may be able to leave soon if improves with current treatment.   PAST MEDICAL HISTORY :   has a past medical history of Back pain; Hypertension; Hypercholesteremia; Anxiety; Hematuria; Chronic kidney disease; GERD (gastroesophageal reflux disease); Arthritis; Cancer; Sleep apnea; Pneumonia; and Diabetes mellitus without complication.  has past surgical history that includes Cholecystectomy; Tonsillectomy; Eye surgery; Lumbar laminectomy/decompression microdiscectomy (Right, 02/13/2014); and AV fistula placement (Left, 02/02/2015). Prior to Admission medications   Medication Sig Start Date End Date Taking? Authorizing Provider  ALPRAZolam Duanne Moron) 1 MG tablet Take 1 mg by mouth daily as needed for anxiety.  12/21/14   Historical Provider, MD  amLODipine (NORVASC) 10 MG tablet Take 10 mg by mouth at bedtime.     Historical Provider, MD  atenolol (TENORMIN) 50 MG tablet Take 50 mg by mouth daily.     Historical Provider, MD  atorvastatin (LIPITOR) 40 MG tablet Take 40 mg by mouth at  bedtime.    Historical Provider, MD  furosemide (LASIX) 40 MG tablet Take 80 mg by mouth daily 01/09/15   Historical Provider, MD  glipiZIDE (GLUCOTROL XL) 10 MG 24 hr tablet Take 10 mg by mouth daily.    Historical Provider, MD  HYDROcodone-acetaminophen (NORCO) 10-325 MG per tablet Take 1 tablet by mouth every 6 (six) hours as needed. 02/10/15   Historical Provider, MD  HYDROcodone-acetaminophen (NORCO/VICODIN) 5-325 MG per tablet Take 1 tablet by mouth daily as needed for moderate pain.     Historical Provider, MD  oxyCODONE-acetaminophen (PERCOCET/ROXICET) 5-325 MG per tablet Take 1 tablet by mouth every 6 (six) hours as needed. 02/02/15   Ulyses Amor, PA-C  POLY-IRON 150 150 MG capsule Take 150 mg by mouth 2 (two) times daily.  01/18/15   Historical Provider, MD  PROAIR RESPICLICK 123XX123 (90 BASE) MCG/ACT AEPB Inhale 2 puffs into the lungs every 4 (four) hours as needed (for shortness of breath).  12/17/14   Historical Provider, MD  QC CALCIUM FAST DISSOLUTION 600 MG TABS tablet Take 600 mg by mouth 2 (two) times daily with a meal.  01/02/15   Historical Provider, MD  sodium bicarbonate 650 MG tablet Take 650 mg by mouth 2 (two) times daily.  01/09/15   Historical Provider, MD  traZODone (DESYREL) 50 MG tablet Take 50 mg by mouth at bedtime as needed for sleep.     Historical Provider, MD   No Known Allergies  FAMILY HISTORY:  is adopted. SOCIAL HISTORY:  reports that he has been smoking Cigarettes.  He started smoking about 41 years ago. He has a 20 pack-year smoking history. He has never used smokeless tobacco. He reports that he does not drink alcohol or use illicit drugs.  REVIEW OF SYSTEMS:   10 point review of system taken, please see HPI for positives and negatives.   SUBJECTIVE:   VITAL SIGNS: Temp:  [97.4 F (36.3 C)-97.7 F (36.5 C)] 97.7 F (36.5 C) (09/09 0800) Pulse Rate:  [53-60] 60 (09/09 0800) Resp:  [16-28] 16 (09/09 0800) BP: (142-148)/(50-61) 142/61 mmHg (09/09  0800) SpO2:  [94 %-100 %] 94 % (09/09 0800) Weight:  [290 lb 9.1 oz (131.8 kg)] 290 lb 9.1 oz (131.8 kg) (09/09 0600) HEMODYNAMICS:   VENTILATOR SETTINGS:   INTAKE / OUTPUT:  Intake/Output Summary (Last 24 hours) at 02/12/15 0937 Last data filed at 02/12/15 0600  Gross per 24 hour  Intake      0 ml  Output    450 ml  Net   -450 ml    PHYSICAL EXAMINATION: General:  Obese WM NAD @rest  Neuro:  Intact HEENT:No JVD Cardiovascular:  HSR RRR SB Lungs: bibasilar crackles Abdomen:  obesed +bs Musculoskeletal:  Intatct Skin:  Warm and dry  LABS:  CBC  Recent Labs Lab 02/12/15 0758  WBC 6.5  HGB 8.8*  HCT 27.2*  PLT 145*   Coag's No results for input(s): APTT, INR in the last 168 hours. BMET  Recent Labs Lab 02/12/15 0758  NA 141  K 5.3*  CL 110  CO2 20*  BUN 69*  CREATININE 6.48*  GLUCOSE 71   Electrolytes  Recent Labs Lab 02/12/15 0758  CALCIUM 7.7*  MG 2.0  PHOS 8.5*   Sepsis Markers No results for input(s): LATICACIDVEN, PROCALCITON, O2SATVEN in the last 168 hours. ABG No results for input(s): PHART, PCO2ART, PO2ART in the last 168 hours. Liver Enzymes  Recent Labs Lab 02/12/15 0758  AST 17  ALT 18  ALKPHOS 77  BILITOT 0.5  ALBUMIN 2.9*   Cardiac Enzymes  Recent Labs Lab 02/12/15 0758  TROPONINI <0.03   Glucose No results for input(s): GLUCAP in the last 168 hours.  Imaging No results found.   ASSESSMENT / PLAN:  PULMONARY OETT A Respiratory distress with untreated OSA and volume overload. P:   O2 as needed Diuresis Nocturnal bipap for OSA  CARDIOVASCULAR CVL  A:  CHF Volume overload P:  check 2 d, last in 2015 with ef 55% grade 1 dysfunction by [facility] office Diuresis burt monitor creatine  RENAL Lab Results  Component Value Date   CREATININE 6.48* 02/12/2015   CREATININE 2.49* 02/14/2014   CREATININE 2.44* 02/06/2014    A:   Acute on chronic renal failure P:   Trend creatine/K+ May need renal  consult Graft to immature to use May need HD cath placement Check renal US for completeness  GASTROINTESTINAL A:   GI protection/heartburn P:   PPI  HEMATOLOGIC A:   No acute issue P:    INFECTIOUS A:  No acute issue P:     ENDOCRINE A:   No acute issue  P:     NEUROLOGIC A:   Intact, follows commands , no deficits noted Chronic back pain followed by NS P:   RASS goal: 1 Request pain meds, tylenolas needed   FAMILY  - Updates: Wife updated at bedside  - Inter-disciplinary family meet or Palliative Care meeting due by:  day 7    TODAY'S SUMMARY:  59 yo MO WM smoker who  complained of heart burn and sob for several days with increased sxs 9/9 and was taken taken to [place] on bipap and heparin drip plus diuresed with lasix and transported to [facility] ICU. He has untreated OSA and does not wear cpap. On arrival to ICU, bipap was discontinued and heparin drip was discontinued. He has CRI with creatine known >6.0. Left AV wrist fistula placed 2 weeks ago per renal service request. PCCM will admit, repeat 12 lead EKG, follow up elevated K+ treated at [facility] with kayexalate along with D50 and insulin. PCCM will admit and follow for now. He may be able to leave soon if improves with current treatment.  Richardson Landry Kc Sedlak ACNP Maryanna Shape PCCM Pager [phone] till 3 pm If no answer page [phone] 02/12/2015, 9:46 AM

## 2015-02-12 NOTE — Progress Notes (Signed)
Hypoglycemic Event  CBG: 61 Treatment: 15 GM carbohydrate snack  Symptoms: None  Follow-up CBG: Time:0905 CBG Result:70  Possible Reasons for Event: Inadequate meal intake  Comments/MD notified:Dr. Rondel Oh, Honestie Kulik L  Remember to initiate Hypoglycemia Order Set & complete

## 2015-02-12 NOTE — Progress Notes (Signed)
Pt came in on BiPAP at 9/3 RT placed pt on BiPAP at 10/50%

## 2015-02-12 NOTE — Progress Notes (Signed)
  Echocardiogram 2D Echocardiogram has been performed.  Joelene Millin 02/12/2015, 12:44 PM

## 2015-02-12 NOTE — Care Management Note (Signed)
Case Management Note  Patient Details  Name: Mark [name] MRN: [id number] Date of Birth: [birth year]  Subjective/Objective:        Adm w resp failure            Action/Plan:lives w wife, pcp dr Shanon Brow tapper   Expected Discharge Date:                  Expected Discharge Plan:     In-House Referral:     Discharge planning Services     Post Acute Care Choice:    Choice offered to:     DME Arranged:    DME Agency:     HH Arranged:    [place] Agency:     Status of Service:     Medicare Important Message Given:    Date Medicare IM Given:    Medicare IM give by:    Date Additional Medicare IM Given:    Additional Medicare Important Message give by:     If discussed at [place] of Stay Meetings, dates discussed:    Additional Comments: ur review  Lacretia Leigh, RN 02/12/2015, 9:36 AM

## 2015-02-12 NOTE — Progress Notes (Signed)
Mark [name] took patient off bipap. Pt is stable at this time.  Pt is on 4L [place] and tolerating well.

## 2015-02-13 ENCOUNTER — Encounter (HOSPITAL_COMMUNITY): Payer: Self-pay | Admitting: Internal Medicine

## 2015-02-13 DIAGNOSIS — N179 Acute kidney failure, unspecified: Secondary | ICD-10-CM

## 2015-02-13 DIAGNOSIS — N189 Chronic kidney disease, unspecified: Secondary | ICD-10-CM

## 2015-02-13 DIAGNOSIS — I5021 Acute systolic (congestive) heart failure: Secondary | ICD-10-CM

## 2015-02-13 DIAGNOSIS — I10 Essential (primary) hypertension: Secondary | ICD-10-CM

## 2015-02-13 LAB — MAGNESIUM: Magnesium: 1.8 mg/dL (ref 1.7–2.4)

## 2015-02-13 LAB — LIPID PANEL
CHOLESTEROL: 111 mg/dL (ref 0–200)
HDL: 31 mg/dL — ABNORMAL LOW (ref >40)
LDL CALC: 69 mg/dL (ref 0–99)
Total CHOL/HDL Ratio: 3.6 RATIO
Triglycerides: 54 mg/dL (ref <150)
VLDL: 11 mg/dL (ref 0–40)

## 2015-02-13 LAB — RENAL FUNCTION PANEL
ALBUMIN: 2.7 g/dL — AB (ref 3.5–5.0)
Anion gap: 10 (ref 5–15)
BUN: 66 mg/dL — AB (ref 6–20)
CO2: 21 mmol/L — ABNORMAL LOW (ref 22–32)
CREATININE: 6.32 mg/dL — AB (ref 0.61–1.24)
Calcium: 7.4 mg/dL — ABNORMAL LOW (ref 8.9–10.3)
Chloride: 105 mmol/L (ref 101–111)
GFR, EST AFRICAN AMERICAN: 10 mL/min — AB (ref >60)
GFR, EST NON AFRICAN AMERICAN: 9 mL/min — AB (ref >60)
Glucose, Bld: 89 mg/dL (ref 65–99)
PHOSPHORUS: 8.6 mg/dL — AB (ref 2.5–4.6)
Potassium: 5 mmol/L (ref 3.5–5.1)
Sodium: 136 mmol/L (ref 135–145)

## 2015-02-13 LAB — CBC WITH DIFFERENTIAL/PLATELET
BASOS ABS: 0.1 10*3/uL (ref 0.0–0.1)
Basophils Relative: 1 % (ref 0–1)
Eosinophils Absolute: 0.5 10*3/uL (ref 0.0–0.7)
Eosinophils Relative: 8 % — ABNORMAL HIGH (ref 0–5)
HEMATOCRIT: 26.1 % — AB (ref 39.0–52.0)
Hemoglobin: 8.7 g/dL — ABNORMAL LOW (ref 13.0–17.0)
LYMPHS PCT: 11 % — AB (ref 12–46)
Lymphs Abs: 0.7 10*3/uL (ref 0.7–4.0)
MCH: 29.4 pg (ref 26.0–34.0)
MCHC: 33.3 g/dL (ref 30.0–36.0)
MCV: 88.2 fL (ref 78.0–100.0)
MONO ABS: 0.6 10*3/uL (ref 0.1–1.0)
Monocytes Relative: 9 % (ref 3–12)
NEUTROS ABS: 4.6 10*3/uL (ref 1.7–7.7)
Neutrophils Relative %: 71 % (ref 43–77)
Platelets: 142 10*3/uL — ABNORMAL LOW (ref 150–400)
RBC: 2.96 MIL/uL — ABNORMAL LOW (ref 4.22–5.81)
RDW: 13.7 % (ref 11.5–15.5)
WBC: 6.4 10*3/uL (ref 4.0–10.5)

## 2015-02-13 MED ORDER — FUROSEMIDE 10 MG/ML IJ SOLN
80.0000 mg | Freq: Three times a day (TID) | INTRAMUSCULAR | Status: AC
  Administered 2015-02-13 (×3): 80 mg via INTRAVENOUS
  Filled 2015-02-13 (×4): qty 8

## 2015-02-13 MED ORDER — NITROGLYCERIN 2 % TD OINT
1.0000 [in_us] | TOPICAL_OINTMENT | Freq: Four times a day (QID) | TRANSDERMAL | Status: DC
  Administered 2015-02-13 – 2015-02-15 (×5): 1 [in_us] via TOPICAL
  Filled 2015-02-13: qty 30

## 2015-02-13 MED ORDER — INSULIN ASPART 100 UNIT/ML ~~LOC~~ SOLN
0.0000 [IU] | Freq: Three times a day (TID) | SUBCUTANEOUS | Status: DC
  Administered 2015-02-13: 2 [IU] via SUBCUTANEOUS
  Administered 2015-02-14: 3 [IU] via SUBCUTANEOUS

## 2015-02-13 MED ORDER — INSULIN ASPART 100 UNIT/ML ~~LOC~~ SOLN: 0.0000 [IU] | Freq: Every day | SUBCUTANEOUS | Status: DC

## 2015-02-13 MED ORDER — HEPARIN SODIUM (PORCINE) 5000 UNIT/ML IJ SOLN
5000.0000 [IU] | Freq: Three times a day (TID) | INTRAMUSCULAR | Status: DC
  Administered 2015-02-13 – 2015-02-15 (×7): 5000 [IU] via SUBCUTANEOUS
  Filled 2015-02-13 (×7): qty 1

## 2015-02-13 NOTE — Progress Notes (Signed)
Hypoglycemic Event  CBG: 68  Treatment: 15 GM carbohydrate snack  Symptoms: None  Follow-up CBG: Time:0500 CBG Result:98  Possible Reasons for Event: Inadequate meal intake  Comments/MD notified:Eink    Theodore Virgin O  Remember to initiate Hypoglycemia Order Set & complete

## 2015-02-13 NOTE — Consult Note (Signed)
CARDIOLOGY CONSULT NOTE     Primary Care Physician: Deloria Lair, MD Referring Physician:  Dr Halford Chessman  Admit Date: 02/12/2015  Reason for consultation:  Acute systolic dysfunction  Mark [name] is a 59 y.o. male with a h/o advanced renal failure who is admitted with acute decompensated renal disease.  He has been followed by nephrology with plans for dialysis being made.  He has developed acutely worsening SOB with chest tightness and is therefore admitted to [facility].  He reports progressive SOB and LE edema x 1 month. He has also been having "heartburn" , worse when his breathing is worse. CXR showed pulm edema in ED.He has been given IV lasix with good response.  His breathing is much better. Bipap was initially required, now on nasal cannula.   Today, he denies symptoms of palpitations,  dizziness, presyncope, syncope, or neurologic sequela. The patient is tolerating medications without difficulties and is otherwise without complaint today.   Past Medical History  Diagnosis Date  . Back pain   . Hypertension   . Hypercholesteremia   . Anxiety     occas. panic attack, takes xanax occas  . Hematuria     being followed by Dr. Hinda Lenis for decreased kidney function   . Chronic kidney disease   . GERD (gastroesophageal reflux disease)     otc- pepcid , approx. every other  day    . Arthritis     herniated disc, lumbar   . 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 Sleep apnea     test aborted, due to not able to relax , since the aborted test he had surgery for gallbladder & he reports that he was told that he has sleep apnea   . Pneumonia     "walking"  . Diabetes mellitus without complication     type 2   Past Surgical History  Procedure Laterality Date  . Cholecystectomy    . Tonsillectomy    . Eye surgery      cataracts remove, bilateral, w/IOL.  . Lumbar laminectomy/decompression microdiscectomy Right 02/13/2014    Procedure: RIGHT LUMBAR FOUR-FIVE, RIGHT LUMBAR  FIVE- SACRAL ONE LUMBAR LAMINECTOMY/DECOMPRESSION MICRODISCECTOMY ;  Surgeon: Ashok Pall, MD;  Location: [facility];  Service: Neurosurgery;  Laterality: Right;  Right L4-5 and Right L5-S1 diskectomies  . Av fistula placement Left 02/02/2015    Procedure: LEFT ARM RADIOCEPHALIC ARTERIOVENOUS (AV) FISTULA CREATION;  Surgeon: Conrad Huntleigh, MD;  Location: [facility];  Service: Vascular;  Laterality: Left;    . amLODipine  5 mg Oral Daily  . atorvastatin  40 mg Oral QHS  . furosemide  80 mg Intravenous 3 times per day  . heparin subcutaneous  5,000 Units Subcutaneous 3 times per day  . insulin aspart  0-5 Units Subcutaneous QHS  . insulin aspart  0-9 Units Subcutaneous TID WC  . nicotine  21 mg Transdermal Daily  . pantoprazole  40 mg Oral Daily      No Known Allergies  Social History   Social History  . Marital Status: Married    Spouse Name: N/A  . Number of Children: N/A  . Years of Education: N/A   Occupational History  . Not on file.   Social History Main Topics  . Smoking status: Current Every Day Smoker -- 0.50 packs/day for 40 years    Types: Cigarettes    Start date: 10/17/1973    Last Attempt to Quit: 01/29/2014  .  Smokeless tobacco: Never Used  . Alcohol Use: No  . Drug Use: No  . Sexual Activity: Not on file   Other Topics Concern  . Not on file   Social History Narrative  Lives in [place].  Former Administrator  Family History  Problem Relation Age of Onset  . Adopted: Yes  . Prostate cancer Father   . Cancer Father     prostate  . Heart failure Maternal Grandmother   . Heart attack Maternal Grandfather   . Colon cancer Maternal Grandfather     ROS- All systems are reviewed and negative except as per the HPI above  Physical Exam: Telemetry: sinus bradycardia Filed Vitals:   02/13/15 1100 02/13/15 1158 02/13/15 1200 02/13/15 1300  BP: 140/53  162/59 167/61  Pulse:  43 44 56  Temp:  97.7 F (36.5 C)    TempSrc:  Oral    Resp:  14 12 14   Height:       Weight:      SpO2:  99% 99% 100%    GEN- The patient is overweight appearing, alert and oriented x 3 today.   Head- normocephalic, atraumatic Eyes-  Sclera clear, conjunctiva pink Ears- hearing intact Oropharynx- clear Neck- supple, + JVD Lungs- bibasilar rales, normal work of breathing Heart- bradycardic regular rhythm, no murmurs, rubs or gallops, PMI not laterally displaced GI- soft, NT, ND, + BS Extremities- no clubbing, cyanosis, +3 edema MS- no significant deformity or atrophy Skin- no rash or lesion Psych- euthymic mood, full affect Neuro- strength and sensation are intact  EKG:  Sinus bradycardia, lateral TWI, prolonged QT  Labs:   Lab Results  Component Value Date   WBC 6.4 02/13/2015   HGB 8.7* 02/13/2015   HCT 26.1* 02/13/2015   MCV 88.2 02/13/2015   PLT 142* 02/13/2015    Recent Labs Lab 02/12/15 0758  02/13/15 0225  NA 141  < > 136  K 5.3*  < > 5.0  CL 110  < > 105  CO2 20*  < > 21*  BUN 69*  < > 66*  CREATININE 6.48*  < > 6.32*  CALCIUM 7.7*  < > 7.4*  PROT 6.1*  --   --   BILITOT 0.5  --   --   ALKPHOS 77  --   --   ALT 18  --   --   AST 17  --   --   GLUCOSE 71  < > 89  < > = values in this interval not displayed. Lab Results  Component Value Date   TROPONINI <0.03 02/12/2015    Lab Results  Component Value Date   CHOL 111 02/13/2015   Lab Results  Component Value Date   HDL 31* 02/13/2015   Lab Results  Component Value Date   LDLCALC 69 02/13/2015   Lab Results  Component Value Date   TRIG 54 02/13/2015   Lab Results  Component Value Date   CHOLHDL 3.6 02/13/2015   No results found for: LDLDIRECT    Echo:  EF 35-40%  ASSESSMENT AND PLAN:   1. Acute on chronic renal failure This is his primary issue Nephrology on board.  He has had some improvement with diuresis, though this unlikely a long term solution. Would continue diuresis as per nephrology  2. Acute systolic dysfunction Possible due to #1 I would recommend  for now that we continue diuresis and then reassess systolic function once his preload has been managed.  He is currently  off of the [place] curve. Diurese as above Not a candidate for beta blockers due to bradycardia Not a candidate for ace/arb due to renal failure Add nitropaste Could consider adding hydralazine for afterload reduction if he still has bp after adequate diuresis Would recommend myoview once he is clinically more stable for further CV risk stratification  3.  Hypertension Diuresis as above On norvasc Add nitrates Add hydralazine next if able  4. Tobacco Cessation strongly advised  cardiology to see again on Monday   Thompson Grayer, MD 02/13/2015  3:28 PM

## 2015-02-13 NOTE — Consult Note (Signed)
Renal Service Consult Note [facility]  Mark [name] 02/13/2015 [place] D Requesting Physician:  Dr Lamonte Sakai  Reason for Consult:  CKD stage 5 HPI: The patient is a 59 y.o. year-old with hx of HTN, DM2, back surgery, HL, DJD, skin cancer who presented to outside ED with SOB and LE edema x 1 month.  Coughing, +orthopnea, "wheezing', all progressive symptoms. He was taking lasix per his renal doctor Dr Hinda Lenis, 80 mg daily which was then increased up to tid. Due to persistent symptoms, last week zaroxlyn was added.  He has also been having "heartburn" , worse when his breathing is worse.  CXR showed pulm edema in ED.  He was transferred to [facility] and admitted to ICU. He was given IV lasix and had 5200 cc UOP last 24 hours and breathing is much better. Bipap was initially required, now on nasal cannula.   He reports a couple month hx of progressive fatigue, also anorexia and some mild to moderate nausea.  No abd pain, diarrhea, no confusion , jerking, no nsaid use.  Has an appt with renal MD next week.  Had an AVF fashioned last week, should be ready to use in about 3 months.   ROS  no HA  no blurred vision  no foot sores  no joint pains  Past Medical History  Past Medical History  Diagnosis Date  . Back pain   . Hypertension   . Hypercholesteremia   . Anxiety     occas. panic attack, takes xanax occas  . Hematuria     being followed by Dr. Hinda Lenis for decreased kidney function   . Chronic kidney disease   . GERD (gastroesophageal reflux disease)     otc- pepcid , approx. every other  day    . Arthritis     herniated disc, lumbar   . 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 Sleep apnea     test aborted, due to not able to relax , since the aborted test he had surgery for gallbladder & he reports that he was told that he has sleep apnea   . Pneumonia     "walking"  . Diabetes mellitus without complication     type 2   Past Surgical History  Past Surgical  History  Procedure Laterality Date  . Cholecystectomy    . Tonsillectomy    . Eye surgery      cataracts remove, bilateral, w/IOL.  . Lumbar laminectomy/decompression microdiscectomy Right 02/13/2014    Procedure: RIGHT LUMBAR FOUR-FIVE, RIGHT LUMBAR FIVE- SACRAL ONE LUMBAR LAMINECTOMY/DECOMPRESSION MICRODISCECTOMY ;  Surgeon: Ashok Pall, MD;  Location: [facility];  Service: Neurosurgery;  Laterality: Right;  Right L4-5 and Right L5-S1 diskectomies  . Av fistula placement Left 02/02/2015    Procedure: LEFT ARM RADIOCEPHALIC ARTERIOVENOUS (AV) FISTULA CREATION;  Surgeon: Conrad Venice, MD;  Location: [facility] OR;  Service: Vascular;  Laterality: Left;   Family History  Family History  Problem Relation Age of Onset  . Adopted: Yes  . Prostate cancer Father   . Cancer Father     prostate  . Heart failure Maternal Grandmother   . Heart attack Maternal Grandfather   . Colon cancer Maternal Grandfather    Social History  reports that he has been smoking Cigarettes.  He started smoking about 41 years ago. He has a 20 pack-year smoking history. He has never used smokeless tobacco. He reports that he does not drink alcohol  or use illicit drugs. Allergies No Known Allergies Home medications Prior to Admission medications   Medication Sig Start Date End Date Taking? Authorizing Provider  ALPRAZolam Duanne Moron) 1 MG tablet Take 1 mg by mouth daily as needed for anxiety.  12/21/14  Yes Historical Provider, MD  amLODipine (NORVASC) 10 MG tablet Take 10 mg by mouth at bedtime.    Yes Historical Provider, MD  atenolol (TENORMIN) 50 MG tablet Take 50 mg by mouth daily.    Yes Historical Provider, MD  atorvastatin (LIPITOR) 40 MG tablet Take 40 mg by mouth at bedtime.   Yes Historical Provider, MD  furosemide (LASIX) 40 MG tablet Take 80 mg by mouth daily 01/09/15  Yes Historical Provider, MD  glipiZIDE (GLUCOTROL XL) 10 MG 24 hr tablet Take 10 mg by mouth daily.   Yes Historical Provider, MD   HYDROcodone-acetaminophen (NORCO) 10-325 MG per tablet Take 1 tablet by mouth every 6 (six) hours as needed for severe pain.  02/10/15  Yes Historical Provider, MD  POLY-IRON 150 150 MG capsule Take 150 mg by mouth 2 (two) times daily.  01/18/15  Yes Historical Provider, MD  PROAIR RESPICLICK [id number] (90 BASE) MCG/ACT AEPB Inhale 2 puffs into the lungs every 4 (four) hours as needed (for shortness of breath).  12/17/14  Yes Historical Provider, MD  QC CALCIUM FAST DISSOLUTION 600 MG TABS tablet Take 600 mg by mouth 2 (two) times daily with a meal.  01/02/15  Yes Historical Provider, MD  sodium bicarbonate 650 MG tablet Take 650 mg by mouth 2 (two) times daily.  01/09/15  Yes Historical Provider, MD  traZODone (DESYREL) 50 MG tablet Take 50 mg by mouth at bedtime as needed for sleep.    Yes Historical Provider, MD  oxyCODONE-acetaminophen (PERCOCET/ROXICET) 5-325 MG per tablet Take 1 tablet by mouth every 6 (six) hours as needed. Patient not taking: Reported on 02/12/2015 02/02/15   Ulyses Amor, PA-C   Liver Function Tests  Recent Labs Lab 02/12/15 0758 02/13/15 0225  AST 17  --   ALT 18  --   ALKPHOS 77  --   BILITOT 0.5  --   PROT 6.1*  --   ALBUMIN 2.9* 2.7*   No results for input(s): LIPASE, AMYLASE in the last 168 hours. CBC  Recent Labs Lab 02/12/15 0758 02/13/15 0225  WBC 6.5 6.4  NEUTROABS  --  4.6  HGB 8.8* 8.7*  HCT 27.2* 26.1*  MCV 89.8 88.2  PLT 145* [id number]*   Basic Metabolic Panel  Recent Labs Lab 02/12/15 0758 02/12/15 1400 02/13/15 0225  NA 141 142 136  K 5.3* 5.0 5.0  CL 110 109 105  CO2 20* 23 21*  GLUCOSE 71 104* 89  BUN 69* 68* 66*  CREATININE 6.48* 6.42* 6.32*  CALCIUM 7.7* 7.8* 7.4*  PHOS 8.5*  --  8.6*    Filed Vitals:   02/13/15 0700 02/13/15 0721 02/13/15 0800 02/13/15 0935  BP: 159/51  157/55 165/62  Pulse:   47 52  Temp:  97.6 F (36.4 C)    TempSrc:  Oral    Resp: 14  14 14   Height:      Weight:      SpO2: 94%  97% 97%   Exam Alert, no  distress No rash, cyanosis or gangrene Sclera anicteric, throat clear No jvd Chest bibasilar rales RRR no MRG Abd obese, ntnd no mass no ascites GU normal male LE 's 2+ firm edema up to the knees bilat Neuro  is alert, O x3, nf  CXR bilat pulm edema Na 136 K 5.0  CO2 21  BUN 66  Creat 6.32   Ca 7.4  Mg 1.8   Alb 2.7 Hb 8.7 plt 142  Assessment: 1 CKD stage 5 2 PUlm edema/ vol overload 3 CM EF 40-45% 4 DM2 5 HTN 6 HL 7 Obesity 8 Hyperkalemia  Plan- patient is diuresing well and will not require HD from a volume standpoint, but should be kept and diuresed until he is euvolemic.  He has some uremic symptoms which are mild to moderate. He has the choice to begin dialysis now or try and wait until the just-placed AVF matures in 3 months.  He is leaning towards waiting.  Have discussed pros and cons in detail with pt and family.  Agree w IV lasix 80 tid for now. Will follow.   Kelly Splinter MD (pgr) [phone]    [phone] 02/13/2015, 11:15 AM

## 2015-02-13 NOTE — Progress Notes (Signed)
PCCM PROGRESS NOTE  ADMISSION DATE: 02/12/2015 REFERRING PROVIDER: Lovie Macadamia  CC: Short of breath  SUBJECTIVE: Breathing better.  Denies chest pain.  OBJECTIVE: Temp:  [97.3 F (36.3 C)-97.7 F (36.5 C)] 97.5 F (36.4 C) (09/10 0415) Pulse Rate:  [40-61] 51 (09/10 0500) Resp:  [12-24] 15 (09/10 0500) BP: (116-179)/(43-70) 179/64 mmHg (09/10 0500) SpO2:  [92 %-100 %] 99 % (09/10 0500) Weight:  [285 lb 0.9 oz (129.3 kg)] 285 lb 0.9 oz (129.3 kg) (09/10 0415) General: pleasant HEENT: no sinus tenderness Cardiac: regular, no murmur Chest: no wheeze Abd: soft, non tender Ext: no edema, AV graft Lt forearm Neuro: normal strength Skin: no rashes   CMP Latest Ref Rng 02/13/2015 02/12/2015 02/12/2015  Glucose 65 - 99 mg/dL 89 104(H) 71  BUN 6 - 20 mg/dL 66(H) 68(H) 69(H)  Creatinine 0.61 - 1.24 mg/dL 6.32(H) 6.42(H) 6.48(H)  Sodium 135 - 145 mmol/L 136 142 141  Potassium 3.5 - 5.1 mmol/L 5.0 5.0 5.3(H)  Chloride 101 - 111 mmol/L 105 109 110  CO2 22 - 32 mmol/L 21(L) 23 20(L)  Calcium 8.9 - 10.3 mg/dL 7.4(L) 7.8(L) 7.7(L)  Total Protein 6.5 - 8.1 g/dL - - 6.1(L)  Total Bilirubin 0.3 - 1.2 mg/dL - - 0.5  Alkaline Phos 38 - 126 U/L - - 77  AST 15 - 41 U/L - - 17  ALT 17 - 63 U/L - - 18     CBC Latest Ref Rng 02/13/2015 02/12/2015 02/02/2015  WBC 4.0 - 10.5 K/uL 6.4 6.5 -  Hemoglobin 13.0 - 17.0 g/dL 8.7(L) 8.8(L) 9.5(L)  Hematocrit 39.0 - 52.0 % 26.1(L) 27.2(L) 28.0(L)  Platelets 150 - 400 K/uL 142(L) 145(L) -    BNP (last 3 results)  Recent Labs  02/12/15 0758  BNP 763.9*    Cardiac Panel (last 3 results)  Recent Labs  02/12/15 0758 02/12/15 1400 02/12/15 1900  TROPONINI <0.03 <0.03 <0.03   CBG (last 3)   Recent Labs  02/12/15 0849 02/12/15 0904 02/12/15 1216  GLUCAP 64* 70 102*     STUDIES: 9/09 Echo >> EF 35 to 40%, mild AS, PAS 35 mmHg  EVENTS: 9/09 To [facility] 9/10 To Tele, cardiology/nephrology consulted  DISCUSSION: 59 yo male smoker with heartburn,  dyspnea, hyperkalemia from pulmonary edema in setting of acute systolic CHF, and stage IV CKD.  ASSESSMENT/PLAN:  Acute hypoxic respiratory failure from acute pulmonary edema >> improved. Plan: - monitor oxygenation  Acute systolic CHF. Hx of HTN, HLD. Plan: - continue norvasc, lipitor - consult cardiology 9/10  Stage IV CKD. Hyperkalemia >> improved. Plan: - f/u renal u/s - b/u BMET - monitor urine outpt - consult nephrology 9/10 - lasix 80 mg q8h x 3 on 9/10  Tobacco abuse. Hx of OSA >> non compliant with CPAP as outpt. Plan: - nicotine patch - CPAP qhs as tolerated  GERD. Plan: - protonix  DM type II. Plan: - SSI - glucotrol  Anemia of critical illness and chronic disease. Plan: - f/u CBC - SQ heparin for DVT prophylaxis  Transfer to telemetry.  Will ask [facility] to assume care from 9/11 and PCCM off.  Chesley Mires, MD [facility] Pulmonary/Critical Care 02/13/2015, 6:38 AM Pager:  [phone] After 3pm call: [phone]

## 2015-02-14 DIAGNOSIS — E875 Hyperkalemia: Secondary | ICD-10-CM

## 2015-02-14 DIAGNOSIS — N19 Unspecified kidney failure: Secondary | ICD-10-CM

## 2015-02-14 DIAGNOSIS — J9601 Acute respiratory failure with hypoxia: Secondary | ICD-10-CM

## 2015-02-14 DIAGNOSIS — I10 Essential (primary) hypertension: Secondary | ICD-10-CM | POA: Insufficient documentation

## 2015-02-14 DIAGNOSIS — N185 Chronic kidney disease, stage 5: Secondary | ICD-10-CM

## 2015-02-14 LAB — GLUCOSE, CAPILLARY
GLUCOSE-CAPILLARY: 102 mg/dL — AB (ref 65–99)
GLUCOSE-CAPILLARY: 169 mg/dL — AB (ref 65–99)
Glucose-Capillary: 127 mg/dL — ABNORMAL HIGH (ref 65–99)
Glucose-Capillary: 94 mg/dL (ref 65–99)

## 2015-02-14 LAB — RENAL FUNCTION PANEL
ALBUMIN: 2.9 g/dL — AB (ref 3.5–5.0)
ANION GAP: 13 (ref 5–15)
BUN: 72 mg/dL — AB (ref 6–20)
CALCIUM: 8.6 mg/dL — AB (ref 8.9–10.3)
CO2: 21 mmol/L — ABNORMAL LOW (ref 22–32)
Chloride: 105 mmol/L (ref 101–111)
Creatinine, Ser: 6.83 mg/dL — ABNORMAL HIGH (ref 0.61–1.24)
GFR calc Af Amer: 9 mL/min — ABNORMAL LOW (ref >60)
GFR, EST NON AFRICAN AMERICAN: 8 mL/min — AB (ref >60)
Glucose, Bld: 86 mg/dL (ref 65–99)
PHOSPHORUS: 8.3 mg/dL — AB (ref 2.5–4.6)
POTASSIUM: 4.8 mmol/L (ref 3.5–5.1)
Sodium: 139 mmol/L (ref 135–145)

## 2015-02-14 LAB — CBC
HEMATOCRIT: 28.4 % — AB (ref 39.0–52.0)
HEMOGLOBIN: 9.5 g/dL — AB (ref 13.0–17.0)
MCH: 29.2 pg (ref 26.0–34.0)
MCHC: 33.5 g/dL (ref 30.0–36.0)
MCV: 87.4 fL (ref 78.0–100.0)
Platelets: 159 10*3/uL (ref 150–400)
RBC: 3.25 MIL/uL — AB (ref 4.22–5.81)
RDW: 13.7 % (ref 11.5–15.5)
WBC: 5.9 10*3/uL (ref 4.0–10.5)

## 2015-02-14 MED ORDER — GLIPIZIDE ER 5 MG PO TB24
5.0000 mg | ORAL_TABLET | Freq: Every day | ORAL | Status: DC
  Administered 2015-02-15: 5 mg via ORAL
  Filled 2015-02-14 (×2): qty 1

## 2015-02-14 MED ORDER — GLIPIZIDE ER 10 MG PO TB24
10.0000 mg | ORAL_TABLET | Freq: Every day | ORAL | Status: DC
  Administered 2015-02-14: 10 mg via ORAL
  Filled 2015-02-14: qty 1

## 2015-02-14 NOTE — Progress Notes (Signed)
  [facility] Progress Note   Subjective: another 5kg off yesterday  Filed Vitals:   02/13/15 2204 02/13/15 2300 02/13/15 2341 02/14/15 0511  BP: 173/64  169/75 128/57  Pulse:  58 58 58  Temp:   97.8 F (36.6 C) 97.5 F (36.4 C)  TempSrc:   Oral Oral  Resp:  15 16 18   Height:   6' (1.829 m)   Weight:   126.463 kg (278 lb 12.8 oz)   SpO2:  95% 97% 100%   Exam: Looks better No jvd Chest is clear bilat RRR no MRG Abd soft ntnd no ascites GU foley cath in place 1-2+ LE edema, improving Neuro is alert      Assessment: 1. CKD stage 5 2. Vol excess/ pulm edema - diuresing well 3. DM 2 4. HTN  Plan - agree with another 24 hours of diuresis, then can go home on about 120 mg bid of po lasix, he has an appt with Dr Lowanda Foster this week. Will sign off. Have asked dietician to help him with low fluid/ low na diet.     Kelly Splinter MD  pager [phone]    cell [phone]  02/14/2015, 1:51 PM     Recent Labs Lab 02/12/15 0758 02/12/15 1400 02/13/15 0225 02/14/15 0546  NA 141 142 136 139  K 5.3* 5.0 5.0 4.8  CL 110 109 105 105  CO2 20* 23 21* 21*  GLUCOSE 71 104* 89 86  BUN 69* 68* 66* 72*  CREATININE 6.48* 6.42* 6.32* 6.83*  CALCIUM 7.7* 7.8* 7.4* 8.6*  PHOS 8.5*  --  8.6* 8.3*    Recent Labs Lab 02/12/15 0758 02/13/15 0225 02/14/15 0546  AST 17  --   --   ALT 18  --   --   ALKPHOS 77  --   --   BILITOT 0.5  --   --   PROT 6.1*  --   --   ALBUMIN 2.9* 2.7* 2.9*    Recent Labs Lab 02/12/15 0758 02/13/15 0225 02/14/15 0546  WBC 6.5 6.4 5.9  NEUTROABS  --  4.6  --   HGB 8.8* 8.7* 9.5*  HCT 27.2* 26.1* 28.4*  MCV 89.8 88.2 87.4  PLT 145* 142* 159   . amLODipine  5 mg Oral Daily  . atorvastatin  40 mg Oral QHS  . [START ON 02/15/2015] glipiZIDE  5 mg Oral QAC breakfast  . heparin subcutaneous  5,000 Units Subcutaneous 3 times per day  . insulin aspart  0-9 Units Subcutaneous TID WC  . nicotine  21 mg Transdermal Daily  . nitroGLYCERIN   1 inch Topical 4 times per day  . pantoprazole  40 mg Oral Daily     albuterol, ALPRAZolam, oxyCODONE-acetaminophen

## 2015-02-14 NOTE — Progress Notes (Signed)
Patient Demographics:    Mark [name], is a 59 y.o. male, DOB - 1955/08/27, QZ:[id number]  Admit date - 02/12/2015   Admitting Physician Collene Gobble, MD  Outpatient Primary MD for the patient is TAPPER,DAVID B, MD  LOS - 2   No chief complaint on file.   Brief summary.  This is a pleasant 59 year old Caucasian male with history of hypertension, diabetes mellitus, chronic kidney disease stage IV, was admitted to ICU after being transferred from [facility] for acute hypoxic respiratory failure acute on chronic combined diastolic and systolic heart failure with EF of 35%. He was diuresed, seen by nephrology and cardiology. Transferred to hospitalist service under my care on 02/14/2015.   Subjective:    Mark [name] today has, No headache, No chest pain, No abdominal pain - No Nausea, No new weakness tingling or numbness, No Cough - SOB.     Assessment  & Plan :     1. Acute hypoxic respiratory failure due to acute on chronic combined systolic and diastolic heart failure. EF 35%. Also underlying CK D stage IV to 5. Currently being diuresed with Lasix, renal and cardiology following, no ACE/ARB due to renal insufficiency. On Nitropaste. Will defer further management of this problem to cardiology and renal.   2. CK D stage IV. Renal following, continue diuresis, avoid nephrotoxins. Does not appear to be uremic today.   3. Hypertension hypertension. Blood pressure stable on Norvasc along with Nitropaste.   4. GERD. On PPI continue   5. Smoker. Counseled to quit. On nicotine patch.    6. Possible underlying OSA. Currently not on C Pap, outpatient pulmonary follow-up and sleep study as needed.    7. DM type II. He is on glipizide along with sliding scale. We will monitor CBGs closely.  Since renal function has worsened and a.m. sugars were low and will drop glipizide dose.  No results found for: HGBA1C  CBG (last 3)   Recent Labs  02/12/15 0904 02/12/15 1216 02/14/15 0602  GLUCAP 70 102* 102*        Code Status : Full  Family Communication  : None present  Disposition Plan  : Home in 1-2 days  Consults  :  Pulmonary, renal, cardiology  Procedures  :   Renal ultrasound done in ICU shows nonobstructing right kidney stone.  DVT Prophylaxis  :   Heparin   Lab Results  Component Value Date   PLT 159 02/14/2015    Inpatient Medications  Scheduled Meds: . amLODipine  5 mg Oral Daily  . atorvastatin  40 mg Oral QHS  . glipiZIDE  10 mg Oral QAC breakfast  . heparin subcutaneous  5,000 Units Subcutaneous 3 times per day  . insulin aspart  0-5 Units Subcutaneous QHS  . insulin aspart  0-9 Units Subcutaneous TID WC  . nicotine  21 mg Transdermal Daily  . nitroGLYCERIN  1 inch Topical 4 times per day  . pantoprazole  40 mg Oral Daily   Continuous Infusions:  PRN Meds:.albuterol, ALPRAZolam, oxyCODONE-acetaminophen  Antibiotics  :   Anti-infectives    None        Objective:   Filed Vitals:   02/13/15 2204 02/13/15 2300 02/13/15 2341 02/14/15 0511  BP: 173/64  169/75 128/57  Pulse:  58 58 58  Temp:   97.8 F (36.6 C) 97.5 F (36.4 C)  TempSrc:   Oral Oral  Resp:  15 16 18   Height:   6' (1.829 m)   Weight:   126.463 kg (278 lb 12.8 oz)   SpO2:  95% 97% 100%    Wt Readings from Last 3 Encounters:  02/13/15 126.463 kg (278 lb 12.8 oz)  02/02/15 131.09 kg (289 lb)  01/22/15 131.407 kg (289 lb 11.2 oz)     Intake/Output Summary (Last 24 hours) at 02/14/15 1119 Last data filed at 02/14/15 0512  Gross per 24 hour  Intake    540 ml  Output   4425 ml  Net  -3885 ml     Physical Exam  Awake Alert, Oriented X 3, No new F.N deficits, Normal affect [place].AT,PERRAL Supple Neck,No JVD, No cervical lymphadenopathy appriciated.    Symmetrical Chest wall movement, Good air movement bilaterally, CTAB RRR,No Gallops,Rubs or new Murmurs, No Parasternal Heave +ve B.Sounds, Abd Soft, No tenderness, No organomegaly appriciated, No rebound - guarding or rigidity. No Cyanosis, Clubbing or edema, No new Rash or bruise       Data Review:   Micro Results Recent Results (from the past 240 hour(s))  MRSA PCR Screening     Status: None   Collection Time: 02/12/15  5:50 AM  Result Value Ref Range Status   MRSA by PCR NEGATIVE NEGATIVE Final    Comment:        The GeneXpert MRSA Assay (FDA approved for NASAL specimens only), is one component of a comprehensive MRSA colonization surveillance program. It is not intended to diagnose MRSA infection nor to guide or monitor treatment for MRSA infections.     Radiology Reports US Renal  02/13/2015   CLINICAL DATA:  Renal failure.  EXAM: RENAL / URINARY TRACT ULTRASOUND COMPLETE  COMPARISON:  None.  FINDINGS: Right Kidney:  Length: 11.6 cm. Echogenic foci within the collecting system of the mid to upper pole, up to 4 mm in size, likely small calculi. No hydronephrosis. No mass.  Left Kidney:  Length: 12.1 cm. Echogenicity within normal limits. No mass or hydronephrosis visualized. 2 cm cyst lower pole.  Bladder:  Appears normal for degree of bladder distention.  IMPRESSION: No hydronephrosis or mass. Normal echogenicity. Suspected nonobstructing nephrolithiasis RIGHT kidney.   Electronically Signed   By: Staci Righter M.D.   On: 02/13/2015 09:29     CBC  Recent Labs Lab 02/12/15 0758 02/13/15 0225 02/14/15 0546  WBC 6.5 6.4 5.9  HGB 8.8* 8.7* 9.5*  HCT 27.2* 26.1* 28.4*  PLT 145* 142* 159  MCV 89.8 88.2 87.4  MCH 29.0 29.4 29.2  MCHC 32.4 33.3 33.5  RDW 13.8 13.7 13.7  LYMPHSABS  --  0.7  --   MONOABS  --  0.6  --   EOSABS  --  0.5  --   BASOSABS  --  0.1  --     Chemistries   Recent Labs Lab 02/12/15 0758 02/12/15 1400 02/13/15 0225 02/14/15 0546  NA 141  142 136 139  K 5.3* 5.0 5.0 4.8  CL 110 109 105 105  CO2 20* 23 21* 21*  GLUCOSE 71 104* 89 86  BUN 69* 68* 66* 72*  CREATININE 6.48* 6.42* 6.32* 6.83*  CALCIUM 7.7* 7.8* 7.4* 8.6*  MG 2.0  --  1.8  --   AST 17  --   --   --  ALT 18  --   --   --   ALKPHOS 77  --   --   --   BILITOT 0.5  --   --   --    ------------------------------------------------------------------------------------------------------------------ estimated creatinine clearance is 16 mL/min (by C-G formula based on Cr of 6.83). ------------------------------------------------------------------------------------------------------------------ No results for input(s): HGBA1C in the last 72 hours. ------------------------------------------------------------------------------------------------------------------  Recent Labs  02/13/15 0225  CHOL 111  HDL 31*  LDLCALC 69  TRIG 54  CHOLHDL 3.6   ------------------------------------------------------------------------------------------------------------------ No results for input(s): TSH, T4TOTAL, T3FREE, THYROIDAB in the last 72 hours.  Invalid input(s): FREET3 ------------------------------------------------------------------------------------------------------------------ No results for input(s): VITAMINB12, FOLATE, FERRITIN, TIBC, IRON, RETICCTPCT in the last 72 hours.  Coagulation profile No results for input(s): INR, PROTIME in the last 168 hours.  No results for input(s): DDIMER in the last 72 hours.  Cardiac Enzymes  Recent Labs Lab 02/12/15 0758 02/12/15 1400 02/12/15 1900  TROPONINI <0.03 <0.03 <0.03   ------------------------------------------------------------------------------------------------------------------ Invalid input(s): POCBNP   Time Spent in minutes 35   Julicia Krieger K M.D on 02/14/2015 at 11:19 AM  Between 7am to 7pm - Pager - [phone]  After 7pm go to [email or web address] - password [facility]  [facility] -  Office   [phone]

## 2015-02-14 NOTE — Progress Notes (Signed)
PATIENT ID:  59 y.o. male with a h/o advanced renal failure who is admitted with acute decompensated renal disease, acute systolic heart failure and volume overload.  INTERVAL HISTORY: Mr. Forrestal is diuresing well on IV lasix.  His volume status is improving.  SUBJECTIVE:  Denies CP.  Breathing has improved since admission.  Wonders when he can go home as he has a follow up appointment tomorrow with neurosurgery for possible back surgery.   PHYSICAL EXAM Filed Vitals:   02/13/15 2204 02/13/15 2300 02/13/15 2341 02/14/15 0511  BP: 173/64  169/75 128/57  Pulse:  58 58 58  Temp:   97.8 F (36.6 C) 97.5 F (36.4 C)  TempSrc:   Oral Oral  Resp:  15 16 18   Height:   6' (1.829 m)   Weight:   126.463 kg (278 lb 12.8 oz)   SpO2:  95% 97% 100%   General:  Well-appearing.  NAD Neck: JVP 3cm above clavicle at 45 degrees Lungs:  Bibasilar rales.  Diminished breath sounds at R base Heart:  RRR.  No m/r/g. Nl S1/S2 Abdomen:  Soft, NT, ND. +BS Extremities:  WWP.  2+ pitting edema to upper tibia bilaterally.  2+ pulses throughout. Musculoskeletal: no significant deformity or atrophy Skin: no rash or lesion Psych: euthymic mood, full affect Neuro: strength and sensation are grossly intact LABS: Lab Results  Component Value Date   TROPONINI <0.03 02/12/2015   Results for orders placed or performed during the hospital encounter of 02/12/15 (from the past 24 hour(s))  CBC     Status: Abnormal   Collection Time: 02/14/15  5:46 AM  Result Value Ref Range   WBC 5.9 4.0 - 10.5 K/uL   RBC 3.25 (L) 4.22 - 5.81 MIL/uL   Hemoglobin 9.5 (L) 13.0 - 17.0 g/dL   HCT 28.4 (L) 39.0 - 52.0 %   MCV 87.4 78.0 - 100.0 fL   MCH 29.2 26.0 - 34.0 pg   MCHC 33.5 30.0 - 36.0 g/dL   RDW 13.7 11.5 - 15.5 %   Platelets 159 150 - 400 K/uL  Renal function panel     Status: Abnormal   Collection Time: 02/14/15  5:46 AM  Result Value Ref Range   Sodium 139 135 - 145 mmol/L   Potassium 4.8 3.5 - 5.1 mmol/L   Chloride 105 101 - 111 mmol/L   CO2 21 (L) 22 - 32 mmol/L   Glucose, Bld 86 65 - 99 mg/dL   BUN 72 (H) 6 - 20 mg/dL   Creatinine, Ser 6.83 (H) 0.61 - 1.24 mg/dL   Calcium 8.6 (L) 8.9 - 10.3 mg/dL   Phosphorus 8.3 (H) 2.5 - 4.6 mg/dL   Albumin 2.9 (L) 3.5 - 5.0 g/dL   GFR calc non Af Amer 8 (L) >60 mL/min   GFR calc Af Amer 9 (L) >60 mL/min   Anion gap 13 5 - 15  Glucose, capillary     Status: Abnormal   Collection Time: 02/14/15  6:02 AM  Result Value Ref Range   Glucose-Capillary 102 (H) 65 - 99 mg/dL  Glucose, capillary     Status: Abnormal   Collection Time: 02/14/15 11:11 AM  Result Value Ref Range   Glucose-Capillary 127 (H) 65 - 99 mg/dL   Comment 1 Notify RN    Comment 2 Document in Chart     Intake/Output Summary (Last 24 hours) at 02/14/15 1230 Last data filed at 02/14/15 0512  Gross per 24 hour  Intake  540 ml  Output   3875 ml  Net  -3335 ml    EKG:  Sinus bradycardia rate 56 bpm.  Non-specific T wave abnormalities.  TTE 02/12/15: Study Conclusions  - Left ventricle: The cavity size was normal. Wall thickness was increased in a pattern of moderate LVH. Systolic function was moderately reduced. The estimated ejection fraction was in the range of 35% to 40%. - Aortic valve: There was very mild stenosis. Valve area (VTI): 1.98 cm^2. Valve area (Vmax): 1.99 cm^2. Valve area (Vmean): 1.99 cm^2. - Mitral valve: Valve area by continuity equation (using LVOT flow): 1.78 cm^2. - Left atrium: The atrium was moderately dilated. - Pulmonary arteries: Systolic pressure was mildly increased. PA peak pressure: 35 mm Hg (S).  ASSESSMENT AND PLAN:  Active Problems:   Acute respiratory failure   Renal failure   Hyperkalemia   Essential hypertension   ASSESSMENT AND PLAN:   1. Acute on chronic renal failure This is his primary issue Nephrology on board. He has had some improvement with diuresis, though this unlikely a long term solution. Would  continue diuresis as per nephrology  2. Acute systolic dysfunction Possible due to #1 I would recommend for now that we continue diuresis and then reassess systolic function once his preload has been managed. He is currently off of the [place] curve. Diurese as above Not a candidate for beta blockers due to bradycardia Not a candidate for ace/arb due to renal failure BP better controlled since adding nitropaste last night. Could consider adding hydralazine for afterload reduction if he still has bp after adequate diuresis Would recommend myoview once he is clinically more stable for further CV risk stratification  3. Hypertension Diuresis as above On norvasc Added nitrates Add hydralazine next if able  4. Tobacco Cessation strongly advised   Sharol Harness, MD 02/14/2015 12:30 PM

## 2015-02-15 LAB — BASIC METABOLIC PANEL
Anion gap: 12 (ref 5–15)
BUN: 78 mg/dL — AB (ref 6–20)
CALCIUM: 8.4 mg/dL — AB (ref 8.9–10.3)
CO2: 22 mmol/L (ref 22–32)
Chloride: 102 mmol/L (ref 101–111)
Creatinine, Ser: 6.69 mg/dL — ABNORMAL HIGH (ref 0.61–1.24)
GFR calc Af Amer: 9 mL/min — ABNORMAL LOW (ref >60)
GFR, EST NON AFRICAN AMERICAN: 8 mL/min — AB (ref >60)
GLUCOSE: 69 mg/dL (ref 65–99)
Potassium: 4.7 mmol/L (ref 3.5–5.1)
Sodium: 136 mmol/L (ref 135–145)

## 2015-02-15 LAB — GLUCOSE, CAPILLARY
GLUCOSE-CAPILLARY: 110 mg/dL — AB (ref 65–99)
GLUCOSE-CAPILLARY: 110 mg/dL — AB (ref 65–99)
GLUCOSE-CAPILLARY: 119 mg/dL — AB (ref 65–99)
Glucose-Capillary: 113 mg/dL — ABNORMAL HIGH (ref 65–99)
Glucose-Capillary: 103 mg/dL — ABNORMAL HIGH (ref 65–99)
Glucose-Capillary: 112 mg/dL — ABNORMAL HIGH (ref 65–99)
Glucose-Capillary: 135 mg/dL — ABNORMAL HIGH (ref 65–99)
Glucose-Capillary: 171 mg/dL — ABNORMAL HIGH (ref 65–99)
Glucose-Capillary: 68 mg/dL (ref 65–99)
Glucose-Capillary: 93 mg/dL (ref 65–99)
Glucose-Capillary: 82 mg/dL (ref 65–99)

## 2015-02-15 LAB — HEMOGLOBIN A1C
HEMOGLOBIN A1C: 6.1 % — AB (ref 4.8–5.6)
Hgb A1c MFr Bld: 6 % — ABNORMAL HIGH (ref 4.8–5.6)
MEAN PLASMA GLUCOSE: 126 mg/dL
MEAN PLASMA GLUCOSE: 128 mg/dL

## 2015-02-15 LAB — MAGNESIUM: Magnesium: 1.8 mg/dL (ref 1.7–2.4)

## 2015-02-15 MED ORDER — GLIPIZIDE ER 5 MG PO TB24: 5.0000 mg | ORAL_TABLET | Freq: Every day | ORAL | Status: DC

## 2015-02-15 MED ORDER — ISOSORBIDE MONONITRATE ER 30 MG PO TB24: 30.0000 mg | ORAL_TABLET | Freq: Every day | ORAL | Status: DC

## 2015-02-15 MED ORDER — FUROSEMIDE 40 MG PO TABS: 120.0000 mg | ORAL_TABLET | Freq: Two times a day (BID) | ORAL | Status: DC

## 2015-02-15 NOTE — Progress Notes (Signed)
Discharge instructions reviewed with the patient. Medications and prescriptions also reviewed.  Follow up appointments also reviewed.  Patient voices understanding to teaching. Ambulatory to the door.  Home via [facility] with his wife driving.

## 2015-02-15 NOTE — Progress Notes (Signed)
At 1320 all d/c instructions explained and given to pt.  Verbalized understanding.  D/c off floor via w/c to awaiting transport.  Johnnae Impastato,RN.

## 2015-02-15 NOTE — Discharge Summary (Signed)
Mark [name], is a 59 y.o. male  DOB April 04, 1956  MRN [id number].  Admission date:  02/12/2015  Admitting Physician  Collene Gobble, MD  Discharge Date:  02/15/2015   Primary MD  Deloria Lair, MD  Recommendations for primary care physician for things to follow:   Monitor BMP closely, must follow with his the floor just tomorrow he has a pending appointment. May require dialysis soon.  Monitor CBGs and glipizide dose closely.   Admission Diagnosis  CP, PULM EDEMA   Discharge Diagnosis  CP, PULM EDEMA    Active Problems:   Acute respiratory failure   Renal failure   Hyperkalemia   Essential hypertension      Past Medical History  Diagnosis Date  . Back pain   . Hypertension   . Hypercholesteremia   . Anxiety     occas. panic attack, takes xanax occas  . Hematuria     being followed by Dr. Hinda Lenis for decreased kidney function   . Chronic kidney disease   . GERD (gastroesophageal reflux disease)     otc- pepcid , approx. every other  day    . Arthritis     herniated disc, lumbar   . 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 Sleep apnea     test aborted, due to not able to relax , since the aborted test he had surgery for gallbladder & he reports that he was told that he has sleep apnea   . Pneumonia     "walking"  . Diabetes mellitus without complication     type 2    Past Surgical History  Procedure Laterality Date  . Cholecystectomy    . Tonsillectomy    . Eye surgery      cataracts remove, bilateral, w/IOL.  . Lumbar laminectomy/decompression microdiscectomy Right 02/13/2014    Procedure: RIGHT LUMBAR FOUR-FIVE, RIGHT LUMBAR FIVE- SACRAL ONE LUMBAR LAMINECTOMY/DECOMPRESSION MICRODISCECTOMY ;  Surgeon: Ashok Pall, MD;  Location: [facility];  Service: Neurosurgery;  Laterality: Right;   Right L4-5 and Right L5-S1 diskectomies  . Av fistula placement Left 02/02/2015    Procedure: LEFT ARM RADIOCEPHALIC ARTERIOVENOUS (AV) FISTULA CREATION;  Surgeon: Conrad Rhine, MD;  Location: [facility];  Service: Vascular;  Laterality: Left;       HPI :    This is a pleasant 59 year old Caucasian male with history of hypertension, diabetes mellitus, chronic kidney disease stage IV, was admitted to ICU after being transferred from [facility] for acute hypoxic respiratory failure acute on chronic combined diastolic and systolic heart failure with EF of 35%. He was diuresed, seen by nephrology and cardiology. Transferred to hospitalist service under my care on 02/14/2015.     Hospital Course:     1. Acute hypoxic respiratory failure due to acute on chronic combined systolic and diastolic heart failure. EF 35%. Also underlying CK D stage IV to 5. Currently being diuresed with Lasix, renal and cardiology following, no ACE/ARB due to renal insufficiency.  On Nitropaste. Will defer further management of this problem to cardiology and renal.   2. CK D stage IV. Renal following, he has been cleared by nephrology to be discharged on 120 mg of twice a day Lasix along with his home medications, to follow with his primary nephrologist tomorrow may require dialysis soon.    3. Hypertension hypertension. Blood pressure stable on Imdur, beta blocker and diuretic.   4. GERD. On PPI continue   5. Smoker. Counseled to quit. On nicotine patch.    6. Possible underlying OSA. Currently not on C Pap, outpatient pulmonary follow-up and sleep study as needed.    7. DM type II. He is on glipizide and his dose has been cut in half, CBG stable here, he has been personally requested to check his CBGs every before meals at bedtime. Request PCP to monitor CBGs and glipizide dose closely.   CBG (last 3)   Recent Labs  02/14/15 1601 02/14/15 2046 02/15/15 0608  GLUCAP 94 169* 113*       Discharge  Condition: Fair  Follow UP  Follow-up Information    Follow up with TAPPER,DAVID B, MD. Schedule an appointment as soon as possible for a visit in 3 days.   Specialty:  Family Medicine   Contact information:   [address] [place] [address] [phone]       Follow up with [facility], MD. Schedule an appointment as soon as possible for a visit in 1 day.   Specialty:  Nephrology   Contact information:   [address] [place] [address] [phone]        Consults obtained - Renal, Cards, PCCM  Diet and Activity recommendation: See Discharge Instructions below  Discharge Instructions           Discharge Instructions    Discharge instructions    Complete by:  As directed   Follow with Primary MD TAPPER,DAVID B, MD along with your primary nephrologist in 2-3 days   Get CBC, CMP, 2 view Chest X ray checked  by Primary MD next visit.    Activity: As tolerated with Full fall precautions use walker/cane & assistance as needed   Disposition Home     Diet: Renal low-carb.  Check your Weight same time everyday, if you gain over 2 pounds, or you develop in leg swelling, experience more shortness of breath or chest pain, call your Primary MD immediately. Follow Cardiac Low Salt Diet and 1.2 lit/day fluid restriction.   On your next visit with your primary care physician please Get Medicines reviewed and adjusted.   Please request your Prim.MD to go over all Hospital Tests and Procedure/Radiological results at the follow up, please get all Hospital records sent to your Prim MD by signing hospital release before you go home.   If you experience worsening of your admission symptoms, develop shortness of breath, life threatening emergency, suicidal or homicidal thoughts you must seek medical attention immediately by calling 911 or calling your MD immediately  if symptoms less severe.  You Must read complete instructions/literature along with all  the possible adverse reactions/side effects for all the Medicines you take and that have been prescribed to you. Take any new Medicines after you have completely understood and accpet all the possible adverse reactions/side effects.   Do not drive, operating heavy machinery, perform activities at heights, swimming or participation in water activities or provide baby sitting services if your were admitted for syncope or siezures until  you have seen by Primary MD or a Neurologist and advised to do so again.  Do not drive when taking Pain medications.    Do not take more than prescribed Pain, Sleep and Anxiety Medications  Special Instructions: If you have smoked or chewed Tobacco  in the last 2 yrs please stop smoking, stop any regular Alcohol  and or any Recreational drug use.  Wear Seat belts while driving.   Please note  You were cared for by a hospitalist during your hospital stay. If you have any questions about your discharge medications or the care you received while you were in the hospital after you are discharged, you can call the unit and asked to speak with the hospitalist on call if the hospitalist that took care of you is not available. Once you are discharged, your primary care physician will handle any further medical issues. Please note that NO REFILLS for any discharge medications will be authorized once you are discharged, as it is imperative that you return to your primary care physician (or establish a relationship with a primary care physician if you do not have one) for your aftercare needs so that they can reassess your need for medications and monitor your lab values.     Increase activity slowly    Complete by:  As directed              Discharge Medications       Medication List    STOP taking these medications        amLODipine 10 MG tablet  Commonly known as:  NORVASC     oxyCODONE-acetaminophen 5-325 MG per tablet  Commonly known as:  PERCOCET/ROXICET        TAKE these medications        ALPRAZolam 1 MG tablet  Commonly known as:  XANAX  Take 1 mg by mouth daily as needed for anxiety.     atenolol 50 MG tablet  Commonly known as:  TENORMIN  Take 50 mg by mouth daily.     atorvastatin 40 MG tablet  Commonly known as:  LIPITOR  Take 40 mg by mouth at bedtime.     furosemide 40 MG tablet  Commonly known as:  LASIX  Take 3 tablets (120 mg total) by mouth 2 (two) times daily.     glipiZIDE 5 MG 24 hr tablet  Commonly known as:  GLUCOTROL XL  Take 1 tablet (5 mg total) by mouth daily before breakfast.     HYDROcodone-acetaminophen 10-325 MG per tablet  Commonly known as:  NORCO  Take 1 tablet by mouth every 6 (six) hours as needed for severe pain.     isosorbide mononitrate 30 MG 24 hr tablet  Commonly known as:  IMDUR  Take 1 tablet (30 mg total) by mouth daily.     POLY-IRON 150 150 MG capsule  Generic drug:  iron polysaccharides  Take 150 mg by mouth 2 (two) times daily.     PROAIR RESPICLICK 123XX123 (90 BASE) MCG/ACT Aepb  Generic drug:  Albuterol Sulfate  Inhale 2 puffs into the lungs every 4 (four) hours as needed (for shortness of breath).     QC CALCIUM FAST DISSOLUTION 600 MG Tabs tablet  Generic drug:  calcium carbonate  Take 600 mg by mouth 2 (two) times daily with a meal.     sodium bicarbonate 650 MG tablet  Take 650 mg by mouth 2 (two) times daily.     traZODone 50 MG  tablet  Commonly known as:  DESYREL  Take 50 mg by mouth at bedtime as needed for sleep.        Major procedures and Radiology Reports - PLEASE review detailed and final reports for all details, in brief -     US Renal  02/13/2015   CLINICAL DATA:  Renal failure.  EXAM: RENAL / URINARY TRACT ULTRASOUND COMPLETE  COMPARISON:  None.  FINDINGS: Right Kidney:  Length: 11.6 cm. Echogenic foci within the collecting system of the mid to upper pole, up to 4 mm in size, likely small calculi. No hydronephrosis. No mass.  Left Kidney:  Length: 12.1  cm. Echogenicity within normal limits. No mass or hydronephrosis visualized. 2 cm cyst lower pole.  Bladder:  Appears normal for degree of bladder distention.  IMPRESSION: No hydronephrosis or mass. Normal echogenicity. Suspected nonobstructing nephrolithiasis RIGHT kidney.   Electronically Signed   By: Staci Righter M.D.   On: 02/13/2015 09:29    Micro Results      Recent Results (from the past 240 hour(s))  MRSA PCR Screening     Status: None   Collection Time: 02/12/15  5:50 AM  Result Value Ref Range Status   MRSA by PCR NEGATIVE NEGATIVE Final    Comment:        The GeneXpert MRSA Assay (FDA approved for NASAL specimens only), is one component of a comprehensive MRSA colonization surveillance program. It is not intended to diagnose MRSA infection nor to guide or monitor treatment for MRSA infections.        Today   Subjective    Mark [name] today has no headache,no chest abdominal pain,no new weakness tingling or numbness, feels much better wants to go home today.    Objective   Blood pressure 147/71, pulse 54, temperature 98 F (36.7 C), temperature source Oral, resp. rate 16, height 6' (1.829 m), weight 123.288 kg (271 lb 12.8 oz), SpO2 99 %.   Intake/Output Summary (Last 24 hours) at 02/15/15 1051 Last data filed at 02/15/15 0900  Gross per 24 hour  Intake    600 ml  Output   1350 ml  Net   -750 ml    Exam Awake Alert, Oriented x 3, No new F.N deficits, Normal affect [place].AT,PERRAL Supple Neck,No JVD, No cervical lymphadenopathy appriciated.  Symmetrical Chest wall movement, Good air movement bilaterally, CTAB RRR,No Gallops,Rubs or new Murmurs, No Parasternal Heave +ve B.Sounds, Abd Soft, Non tender, No organomegaly appriciated, No rebound -guarding or rigidity. No Cyanosis, Clubbing , 1+ edema, No new Rash or bruise   Data Review   CBC w Diff:  Lab Results  Component Value Date   WBC 5.9 02/14/2015   HGB 9.5* 02/14/2015   HCT 28.4*  02/14/2015   PLT 159 02/14/2015   LYMPHOPCT 11* 02/13/2015   MONOPCT 9 02/13/2015   EOSPCT 8* 02/13/2015   BASOPCT 1 02/13/2015    CMP:  Lab Results  Component Value Date   NA 136 02/15/2015   K 4.7 02/15/2015   CL 102 02/15/2015   CO2 22 02/15/2015   BUN 78* 02/15/2015   CREATININE 6.69* 02/15/2015   PROT 6.1* 02/12/2015   ALBUMIN 2.9* 02/14/2015   BILITOT 0.5 02/12/2015   ALKPHOS 77 02/12/2015   AST 17 02/12/2015   ALT 18 02/12/2015  .   Total Time in preparing paper work, data evaluation and todays exam - 35 minutes  Thurnell Lose M.D on 02/15/2015 at 10:51 AM  [facility]  Office  [phone]

## 2015-02-15 NOTE — Discharge Instructions (Signed)
Follow with Primary MD TAPPER,DAVID B, MD along with your primary nephrologist in 2-3 days   Get CBC, CMP, 2 view Chest X ray checked  by Primary MD next visit.    Activity: As tolerated with Full fall precautions use walker/cane & assistance as needed   Disposition Home     Diet: Renal low-carb.  Check your Weight same time everyday, if you gain over 2 pounds, or you develop in leg swelling, experience more shortness of breath or chest pain, call your Primary MD immediately. Follow Cardiac Low Salt Diet and 1.2 lit/day fluid restriction.   On your next visit with your primary care physician please Get Medicines reviewed and adjusted.   Please request your Prim.MD to go over all Hospital Tests and Procedure/Radiological results at the follow up, please get all Hospital records sent to your Prim MD by signing hospital release before you go home.   If you experience worsening of your admission symptoms, develop shortness of breath, life threatening emergency, suicidal or homicidal thoughts you must seek medical attention immediately by calling 911 or calling your MD immediately  if symptoms less severe.  You Must read complete instructions/literature along with all the possible adverse reactions/side effects for all the Medicines you take and that have been prescribed to you. Take any new Medicines after you have completely understood and accpet all the possible adverse reactions/side effects.   Do not drive, operating heavy machinery, perform activities at heights, swimming or participation in water activities or provide baby sitting services if your were admitted for syncope or siezures until you have seen by Primary MD or a Neurologist and advised to do so again.  Do not drive when taking Pain medications.    Do not take more than prescribed Pain, Sleep and Anxiety Medications  Special Instructions: If you have smoked or chewed Tobacco  in the last 2 yrs please stop smoking, stop any  regular Alcohol  and or any Recreational drug use.  Wear Seat belts while driving.   Please note  You were cared for by a hospitalist during your hospital stay. If you have any questions about your discharge medications or the care you received while you were in the hospital after you are discharged, you can call the unit and asked to speak with the hospitalist on call if the hospitalist that took care of you is not available. Once you are discharged, your primary care physician will handle any further medical issues. Please note that NO REFILLS for any discharge medications will be authorized once you are discharged, as it is imperative that you return to your primary care physician (or establish a relationship with a primary care physician if you do not have one) for your aftercare needs so that they can reassess your need for medications and monitor your lab values.

## 2015-03-02 ENCOUNTER — Other Ambulatory Visit: Payer: Self-pay

## 2015-03-02 NOTE — Progress Notes (Signed)
Rec'd call from [facility] @ Dr. Rhona Leavens office requesting to schedule pt. for Insertion Dialysis Catheter within the next week.  Scheduled for procedure 03/09/15 @ 7:30 AM with Dr. Bridgett Larsson.  Pt's wife was notified of plan, and pre-op instructions given.  Verb. Understanding.

## 2015-03-06 DIAGNOSIS — I219 Acute myocardial infarction, unspecified: Secondary | ICD-10-CM

## 2015-03-06 HISTORY — DX: Acute myocardial infarction, unspecified: I21.9

## 2015-03-08 ENCOUNTER — Encounter (HOSPITAL_COMMUNITY): Payer: Self-pay | Admitting: *Deleted

## 2015-03-08 MED ORDER — DEXTROSE 5 % IV SOLN
1.5000 g | INTRAVENOUS | Status: AC
  Administered 2015-03-09: 1.5 g via INTRAVENOUS
  Filled 2015-03-08: qty 1.5

## 2015-03-08 NOTE — Progress Notes (Signed)
Anesthesia Chart Review: SAME DAY WORK-UP.  Patient is a 58 year old male scheduled for insertion of tunnels dialysis catheter tomorrow by Dr. Bridgett Larsson.  History includes HTN, hypercholesterolemia, DM2, CKD stage V (nearing need for hemodialysis), OSA, anxiety, GERD, skin cancer, right L4-S1 microdiskectomies '15, cholecystectomy, tonsillectomy, left radiocephalic AVF 123XX123. Admitted 02/12/15-02/15/15 for acute respiratory failure (see below).   Nephrologist is Dr. Hinda Lenis. PCP is Dr. Matthias Hughs. He is scheduled to follow-up with cardiologist Dr. Bronson Ing on 03/16/15 for hospital follow-up.  Seen by cardiologist Dr. Bronson Ing in 02/2014 for preoperative evaluation with abnormal EKG. He ultimately cleared patient for lumbar surgery on exam and echo findings, but did recommend a nuclear myocardial perfusion study at some point to evaluate for occult ischemic heart disease. On 02/12/15 he was transferred to [facility] from [facility] for acute SOB and chest pain/heartburn. Troponin was negative X 3 [facility]). NT-Pro BNP was 11,415-12,230 [facility]). He was admitted by CCM for acute decompensated renal disease/volume excess and required diuresis. Echo showed a decrease in his EF from 55% to 35-40% since 02/2014. Cardiologist Dr. Rayann Heman was consulted for acute systolic CHF. His recommendations included: Acute systolic dysfunction, possibly due to acute on chronic renal failure. Continue diuresis and then reassess systolic function once his preload has been manages. Add Nitro paste. Not a candidate for b-blocker due to bradycardia. Not a candidate for ACE/ARB due to renal failure. Could consider adding hydralazine for afterload reduction if he still has bp after adequate diuresis. Would recommend myoview once he is clinically more stable for further CV risk stratification.  Medications include: Xanax, Lipitor, Lasix, Norco, Imdur, Zaroxolyn, amlodipine, glipizide, trazodone, poly-iron, Proair.  02/12/15  Echo: Study Conclusions - Left ventricle: The cavity size was normal. Wall thickness was increased in a pattern of moderate LVH. Systolic function was moderately reduced. The estimated ejection fraction was in the range of 35% to 40%. - Aortic valve: There was very mild stenosis. Valve area (VTI): 1.98 cm^2. Valve area (Vmax): 1.99 cm^2. Valve area (Vmean): 1.99 cm^2. - Mitral valve: Valve area by continuity equation (using LVOT flow): 1.78 cm^2. - Left atrium: The atrium was moderately dilated. - Pulmonary arteries: Systolic pressure was mildly increased. PA peak pressure: 35 mm Hg (S). (Prevous 02/12/14 echo showed: The estimated ejection fraction was approximately 55%. Wall motion was normal; there were no regional wall motion abnormalities.)  02/12/15 EKG: SB at 58 bpm, T wave abnormality, consider lateral ischemia, prolonged QT. High lateral T wave inversion is more prominent when compared to 02/06/14 tracing.  02/10/15 1V CXR [facility]): Impression: Cardiac enlargement with pulmonary vascular congestion and mild interstitial edema. 02/10/15 2V CXR [facility]): Impression: Mild left basilar infiltrate. (Reports scanned under Media Tab.)  He is for labs on arrival.   Patient with new LV dysfunction found during recent admission. Dr. Rayann Heman recommended further testing once clinically stable, but now patient needs to start hemodialysis and AVF not yet ready to use. He is on Imdur, and denied chest pain during phone interview today. Discussed with anesthesiologist Dr. Veatrice Kells who agrees that if HD is imminent and needs catheter now for access then would anticipate that patient could proceed as planned if no active CV symptoms and labs are acceptable. Case is posted for MAC. He is scheduled to see Dr. Bronson Ing on 03/16/15. Hopefully, plans for further cardiac testing will be determined at that time.   George Hugh [facility]/Anesthesiology Phone [phone] 03/08/2015 3:08 PM

## 2015-03-08 NOTE — Progress Notes (Signed)
Pt was in the hospital in September for acute respiratory failure. Discharged on 02/15/15. Pt is no longer taking Atenolol. Pt denies any recent chest pain or sob since discharge. States BP runs around 150/90 most of the time. He states he has a headache and dizzy spells in the past week. Denies low blood sugars, fever or chills.

## 2015-03-09 ENCOUNTER — Ambulatory Visit (HOSPITAL_COMMUNITY): Payer: Managed Care, Other (non HMO) | Admitting: Vascular Surgery

## 2015-03-09 ENCOUNTER — Ambulatory Visit (HOSPITAL_COMMUNITY): Payer: Managed Care, Other (non HMO)

## 2015-03-09 ENCOUNTER — Encounter (HOSPITAL_COMMUNITY): Payer: Self-pay | Admitting: Surgery

## 2015-03-09 ENCOUNTER — Encounter (HOSPITAL_COMMUNITY)
Admission: RE | Disposition: A | Discharge status: Home / Self Care | Payer: Self-pay | Source: Ambulatory Visit | Attending: Vascular Surgery

## 2015-03-09 ENCOUNTER — Ambulatory Visit (HOSPITAL_COMMUNITY)
Admission: RE | Admit: 2015-03-09 | Discharge: 2015-03-09 | Disposition: A | Discharge status: Home / Self Care | Payer: Managed Care, Other (non HMO) | Source: Ambulatory Visit | Attending: Vascular Surgery | Admitting: Vascular Surgery

## 2015-03-09 DIAGNOSIS — M5126 Other intervertebral disc displacement, lumbar region: Secondary | ICD-10-CM | POA: Insufficient documentation

## 2015-03-09 DIAGNOSIS — Z6834 Body mass index (BMI) 34.0-34.9, adult: Secondary | ICD-10-CM | POA: Diagnosis not present

## 2015-03-09 DIAGNOSIS — M1990 Unspecified osteoarthritis, unspecified site: Secondary | ICD-10-CM | POA: Insufficient documentation

## 2015-03-09 DIAGNOSIS — D759 Disease of blood and blood-forming organs, unspecified: Secondary | ICD-10-CM | POA: Diagnosis not present

## 2015-03-09 DIAGNOSIS — J449 Chronic obstructive pulmonary disease, unspecified: Secondary | ICD-10-CM | POA: Insufficient documentation

## 2015-03-09 DIAGNOSIS — N186 End stage renal disease: Secondary | ICD-10-CM | POA: Diagnosis not present

## 2015-03-09 DIAGNOSIS — Z419 Encounter for procedure for purposes other than remedying health state, unspecified: Secondary | ICD-10-CM

## 2015-03-09 DIAGNOSIS — I120 Hypertensive chronic kidney disease with stage 5 chronic kidney disease or end stage renal disease: Secondary | ICD-10-CM | POA: Diagnosis not present

## 2015-03-09 DIAGNOSIS — E7800 Pure hypercholesterolemia, unspecified: Secondary | ICD-10-CM | POA: Insufficient documentation

## 2015-03-09 DIAGNOSIS — E1122 Type 2 diabetes mellitus with diabetic chronic kidney disease: Secondary | ICD-10-CM | POA: Insufficient documentation

## 2015-03-09 DIAGNOSIS — F17210 Nicotine dependence, cigarettes, uncomplicated: Secondary | ICD-10-CM | POA: Diagnosis not present

## 2015-03-09 DIAGNOSIS — I517 Cardiomegaly: Secondary | ICD-10-CM | POA: Diagnosis not present

## 2015-03-09 DIAGNOSIS — G4733 Obstructive sleep apnea (adult) (pediatric): Secondary | ICD-10-CM | POA: Insufficient documentation

## 2015-03-09 DIAGNOSIS — K219 Gastro-esophageal reflux disease without esophagitis: Secondary | ICD-10-CM | POA: Diagnosis not present

## 2015-03-09 DIAGNOSIS — Z85828 Personal history of other malignant neoplasm of skin: Secondary | ICD-10-CM | POA: Diagnosis not present

## 2015-03-09 DIAGNOSIS — Z992 Dependence on renal dialysis: Secondary | ICD-10-CM

## 2015-03-09 DIAGNOSIS — N185 Chronic kidney disease, stage 5: Secondary | ICD-10-CM | POA: Diagnosis not present

## 2015-03-09 DIAGNOSIS — F419 Anxiety disorder, unspecified: Secondary | ICD-10-CM | POA: Diagnosis not present

## 2015-03-09 HISTORY — DX: Personal history of other medical treatment: Z92.89

## 2015-03-09 HISTORY — PX: INSERTION OF DIALYSIS CATHETER: SHX1324

## 2015-03-09 HISTORY — DX: Personal history of other diseases of the respiratory system: Z87.09

## 2015-03-09 LAB — POCT I-STAT 4, (NA,K, GLUC, HGB,HCT)
GLUCOSE: 80 mg/dL (ref 65–99)
HCT: 27 % — ABNORMAL LOW (ref 39.0–52.0)
Hemoglobin: 9.2 g/dL — ABNORMAL LOW (ref 13.0–17.0)
POTASSIUM: 4.8 mmol/L (ref 3.5–5.1)
SODIUM: 137 mmol/L (ref 135–145)

## 2015-03-09 LAB — GLUCOSE, CAPILLARY
GLUCOSE-CAPILLARY: 92 mg/dL (ref 65–99)
GLUCOSE-CAPILLARY: 105 mg/dL — AB (ref 65–99)

## 2015-03-09 SURGERY — INSERTION OF DIALYSIS CATHETER
Anesthesia: Monitor Anesthesia Care | Site: Neck

## 2015-03-09 MED ORDER — PROPOFOL 10 MG/ML IV BOLUS
INTRAVENOUS | Status: DC | PRN
  Administered 2015-03-09 (×2): 10 mg via INTRAVENOUS
  Administered 2015-03-09: 20 mg via INTRAVENOUS
  Administered 2015-03-09: 10 mg via INTRAVENOUS

## 2015-03-09 MED ORDER — HEPARIN SODIUM (PORCINE) 1000 UNIT/ML IJ SOLN
INTRAMUSCULAR | Status: AC
  Filled 2015-03-09: qty 1

## 2015-03-09 MED ORDER — 0.9 % SODIUM CHLORIDE (POUR BTL) OPTIME
TOPICAL | Status: DC | PRN
  Administered 2015-03-09: 1000 mL

## 2015-03-09 MED ORDER — LIDOCAINE HCL (PF) 1 % IJ SOLN
INTRAMUSCULAR | Status: AC
  Filled 2015-03-09: qty 30

## 2015-03-09 MED ORDER — HEPARIN SODIUM (PORCINE) 1000 UNIT/ML IJ SOLN
INTRAMUSCULAR | Status: DC | PRN
  Administered 2015-03-09: 1000 [IU]

## 2015-03-09 MED ORDER — PHENYLEPHRINE HCL 10 MG/ML IJ SOLN
INTRAMUSCULAR | Status: AC
  Filled 2015-03-09: qty 1

## 2015-03-09 MED ORDER — MIDAZOLAM HCL 2 MG/2ML IJ SOLN
INTRAMUSCULAR | Status: AC
  Filled 2015-03-09: qty 4

## 2015-03-09 MED ORDER — SODIUM CHLORIDE 0.9 % IV SOLN
INTRAVENOUS | Status: DC | PRN
  Administered 2015-03-09: 08:00:00

## 2015-03-09 MED ORDER — EPHEDRINE SULFATE 50 MG/ML IJ SOLN
INTRAMUSCULAR | Status: AC
  Filled 2015-03-09: qty 1

## 2015-03-09 MED ORDER — CHLORHEXIDINE GLUCONATE CLOTH 2 % EX PADS: 6.0000 | MEDICATED_PAD | Freq: Once | CUTANEOUS | Status: DC

## 2015-03-09 MED ORDER — PROPOFOL 10 MG/ML IV BOLUS
INTRAVENOUS | Status: AC
  Filled 2015-03-09: qty 20

## 2015-03-09 MED ORDER — PROMETHAZINE HCL 25 MG/ML IJ SOLN: 6.2500 mg | INTRAMUSCULAR | Status: DC | PRN

## 2015-03-09 MED ORDER — LIDOCAINE-EPINEPHRINE (PF) 1 %-1:200000 IJ SOLN
INTRAMUSCULAR | Status: AC
  Filled 2015-03-09: qty 30

## 2015-03-09 MED ORDER — FENTANYL CITRATE (PF) 100 MCG/2ML IJ SOLN
INTRAMUSCULAR | Status: DC | PRN
  Administered 2015-03-09: 50 ug via INTRAVENOUS
  Administered 2015-03-09: 25 ug via INTRAVENOUS

## 2015-03-09 MED ORDER — MIDAZOLAM HCL 2 MG/2ML IJ SOLN: 0.5000 mg | Freq: Once | INTRAMUSCULAR | Status: DC | PRN

## 2015-03-09 MED ORDER — OXYCODONE-ACETAMINOPHEN 5-325 MG PO TABS: 1.0000 | ORAL_TABLET | Freq: Four times a day (QID) | ORAL | Status: DC | PRN

## 2015-03-09 MED ORDER — MIDAZOLAM HCL 5 MG/5ML IJ SOLN
INTRAMUSCULAR | Status: DC | PRN
  Administered 2015-03-09: 2 mg via INTRAVENOUS

## 2015-03-09 MED ORDER — LIDOCAINE HCL (CARDIAC) 20 MG/ML IV SOLN
INTRAVENOUS | Status: DC | PRN
  Administered 2015-03-09: 80 mg via INTRAVENOUS

## 2015-03-09 MED ORDER — LIDOCAINE-EPINEPHRINE (PF) 1 %-1:200000 IJ SOLN
INTRAMUSCULAR | Status: DC | PRN
  Administered 2015-03-09: 26 mL

## 2015-03-09 MED ORDER — FENTANYL CITRATE (PF) 250 MCG/5ML IJ SOLN
INTRAMUSCULAR | Status: AC
  Filled 2015-03-09: qty 5

## 2015-03-09 MED ORDER — FENTANYL CITRATE (PF) 100 MCG/2ML IJ SOLN: 25.0000 ug | INTRAMUSCULAR | Status: DC | PRN

## 2015-03-09 MED ORDER — SODIUM CHLORIDE 0.9 % IV SOLN
INTRAVENOUS | Status: DC
  Administered 2015-03-09: 07:00:00 via INTRAVENOUS

## 2015-03-09 MED ORDER — MEPERIDINE HCL 25 MG/ML IJ SOLN: 6.2500 mg | INTRAMUSCULAR | Status: DC | PRN

## 2015-03-09 SURGICAL SUPPLY — 42 items
BAG DECANTER FOR FLEXI CONT (MISCELLANEOUS) ×2 IMPLANT
BIOPATCH RED 1 DISK 7.0 (GAUZE/BANDAGES/DRESSINGS) ×2 IMPLANT
CATH CANNON HEMO 15F 50CM (CATHETERS) IMPLANT
CATH CANNON HEMO 15FR 19 (HEMODIALYSIS SUPPLIES) ×1 IMPLANT
CATH CANNON HEMO 15FR 23CM (HEMODIALYSIS SUPPLIES) IMPLANT
CATH CANNON HEMO 15FR 31CM (HEMODIALYSIS SUPPLIES) IMPLANT
CATH CANNON HEMO 15FR 32 (HEMODIALYSIS SUPPLIES) IMPLANT
CATH CANNON HEMO 15FR 32CM (HEMODIALYSIS SUPPLIES) IMPLANT
CATH STRAIGHT 5FR 65CM (CATHETERS) IMPLANT
COVER PROBE W GEL 5X96 (DRAPES) ×2 IMPLANT
DRAPE C-ARM 42X72 X-RAY (DRAPES) ×2 IMPLANT
DRAPE CHEST BREAST 15X10 FENES (DRAPES) ×2 IMPLANT
GAUZE SPONGE 2X2 8PLY STRL LF (GAUZE/BANDAGES/DRESSINGS) ×1 IMPLANT
GAUZE SPONGE 4X4 16PLY XRAY LF (GAUZE/BANDAGES/DRESSINGS) ×2 IMPLANT
GLOVE BIO SURGEON STRL SZ7 (GLOVE) ×2 IMPLANT
GLOVE BIOGEL PI IND STRL 7.5 (GLOVE) ×1 IMPLANT
GLOVE BIOGEL PI INDICATOR 7.5 (GLOVE) ×1
GOWN STRL REUS W/ TWL LRG LVL3 (GOWN DISPOSABLE) ×2 IMPLANT
GOWN STRL REUS W/TWL LRG LVL3 (GOWN DISPOSABLE) ×4
KIT BASIN OR (CUSTOM PROCEDURE TRAY) ×2 IMPLANT
KIT ROOM TURNOVER OR (KITS) ×2 IMPLANT
LIQUID BAND (GAUZE/BANDAGES/DRESSINGS) ×1 IMPLANT
NDL 18GX1X1/2 (RX/OR ONLY) (NEEDLE) ×1 IMPLANT
NDL HYPO 25GX1X1/2 BEV (NEEDLE) ×1 IMPLANT
NEEDLE 18GX1X1/2 (RX/OR ONLY) (NEEDLE) ×2 IMPLANT
NEEDLE HYPO 25GX1X1/2 BEV (NEEDLE) ×2 IMPLANT
NS IRRIG 1000ML POUR BTL (IV SOLUTION) ×2 IMPLANT
PACK SURGICAL SETUP 50X90 (CUSTOM PROCEDURE TRAY) ×2 IMPLANT
PAD ARMBOARD 7.5X6 YLW CONV (MISCELLANEOUS) ×4 IMPLANT
SET MICROPUNCTURE 5F STIFF (MISCELLANEOUS) IMPLANT
SOAP 2 % CHG 4 OZ (WOUND CARE) ×2 IMPLANT
SPONGE GAUZE 2X2 STER 10/PKG (GAUZE/BANDAGES/DRESSINGS) ×1
SUT ETHILON 3 0 PS 1 (SUTURE) ×2 IMPLANT
SUT MNCRL AB 4-0 PS2 18 (SUTURE) ×2 IMPLANT
SYR 20CC LL (SYRINGE) ×4 IMPLANT
SYR 3ML LL SCALE MARK (SYRINGE) ×2 IMPLANT
SYR 5ML LL (SYRINGE) ×2 IMPLANT
SYR CONTROL 10ML LL (SYRINGE) ×2 IMPLANT
SYRINGE 10CC LL (SYRINGE) ×2 IMPLANT
TAPE CLOTH SOFT 2X10 (GAUZE/BANDAGES/DRESSINGS) ×1 IMPLANT
WATER STERILE IRR 1000ML POUR (IV SOLUTION) ×2 IMPLANT
WIRE AMPLATZ SS-J .035X180CM (WIRE) IMPLANT

## 2015-03-09 NOTE — H&P (Signed)
Brief History and Physical  History of Present Illness  Mark [name] is a 59 y.o. male who presents with chief complaint: imminent ESRD.  The patient presents today for [facility] placement.  I placed a L RC AVF on 02/02/15 but the fistula has not matured yet.  He denies steal syndrome.  Past Medical History  Diagnosis Date  . Back pain   . Hypertension   . Hypercholesteremia   . Anxiety     occas. panic attack, takes xanax occas  . Hematuria     being followed by Dr. Hinda Lenis for decreased kidney function   . Chronic kidney disease   . GERD (gastroesophageal reflux disease)     otc- pepcid , approx. every other  day    . Arthritis     herniated disc, lumbar   . Cancer ([place])     - skin ca on face- removed   . Sleep apnea     test aborted, due to not able to relax , since the aborted test he had surgery for gallbladder & he reports that he was told that he has sleep apnea   . Pneumonia     "walking"  . Diabetes mellitus without complication ([place])     type 2  . H/O acute respiratory failure   . History of blood product transfusion 02/13/2014    After having back surgery    Past Surgical History  Procedure Laterality Date  . Cholecystectomy    . Tonsillectomy    . Eye surgery      cataracts remove, bilateral, w/IOL.  . Lumbar laminectomy/decompression microdiscectomy Right 02/13/2014    Procedure: RIGHT LUMBAR FOUR-FIVE, RIGHT LUMBAR FIVE- SACRAL ONE LUMBAR LAMINECTOMY/DECOMPRESSION MICRODISCECTOMY ;  Surgeon: Ashok Pall, MD;  Location: [facility];  Service: Neurosurgery;  Laterality: Right;  Right L4-5 and Right L5-S1 diskectomies  . Av fistula placement Left 02/02/2015    Procedure: LEFT ARM RADIOCEPHALIC ARTERIOVENOUS (AV) FISTULA CREATION;  Surgeon: Conrad Rosedale, MD;  Location: [place];  Service: Vascular;  Laterality: Left;    Social History   Social History  . Marital Status: Married    Spouse Name: N/A  . Number of Children: N/A  . Years of Education: N/A    Occupational History  . Not on file.   Social History Main Topics  . Smoking status: Current Every Day Smoker -- 0.50 packs/day for 40 years    Types: Cigarettes    Start date: 10/17/1973    Last Attempt to Quit: 01/29/2014  . Smokeless tobacco: Never Used  . Alcohol Use: No  . Drug Use: No  . Sexual Activity: Not on file   Other Topics Concern  . Not on file   Social History Narrative    Family History  Problem Relation Age of Onset  . Adopted: Yes  . Prostate cancer Father   . Cancer Father     prostate  . Heart failure Maternal Grandmother   . Heart attack Maternal Grandfather   . Colon cancer Maternal Grandfather     No current facility-administered medications on file prior to encounter.   Current Outpatient Prescriptions on File Prior to Encounter  Medication Sig Dispense Refill  . ALPRAZolam (XANAX) 1 MG tablet Take 1 mg by mouth daily as needed for anxiety.     Marland Kitchen atorvastatin (LIPITOR) 40 MG tablet Take 40 mg by mouth at bedtime.    . furosemide (LASIX) 40 MG tablet Take 3 tablets (120 mg total) by  mouth 2 (two) times daily. (Patient taking differently: Take 80 mg by mouth 2 (two) times daily. ) 160 tablet 0  . glipiZIDE (GLUCOTROL XL) 5 MG 24 hr tablet Take 1 tablet (5 mg total) by mouth daily before breakfast. 30 tablet 0  . HYDROcodone-acetaminophen (NORCO) 10-325 MG per tablet Take 1 tablet by mouth 2 (two) times daily as needed for severe pain.   0  . isosorbide mononitrate (IMDUR) 30 MG 24 hr tablet Take 1 tablet (30 mg total) by mouth daily. 30 tablet 0  . POLY-IRON 150 150 MG capsule Take 150 mg by mouth 2 (two) times daily.     . QC CALCIUM FAST DISSOLUTION 600 MG TABS tablet Take 600 mg by mouth 2 (two) times daily with a meal.   4  . sodium bicarbonate 650 MG tablet Take 650 mg by mouth 3 (three) times daily.   4  . traZODone (DESYREL) 50 MG tablet Take 50 mg by mouth at bedtime as needed for sleep.       No Known Allergies  Review of Systems: As  listed above, otherwise negative.  Physical Examination  Filed Vitals:   03/09/15 0603  BP: 112/74  Pulse: 70  Temp: 97.7 F (36.5 C)  TempSrc: Oral  Resp: 18  Height: 6' (1.829 m)  Weight: 253 lb (114.76 kg)  SpO2: 95%    General: A&O x 3, WDWN  Pulmonary: Sym exp, good air movt, CTAB, no rales, rhonchi, & wheezing  Cardiac: RRR, Nl S1, S2, no Murmurs, rubs or gallops  Gastrointestinal: soft, NTND, -G/R, - HSM, - masses, - CVAT B  Musculoskeletal: M/S 5/5 throughout , Extremities without ischemic changes   Laboratory See [place]  Mark [name] is a 59 y.o. male who presents with: imminent ESRD.   The patient is scheduled for: [facility] placement The patient is aware the risks of tunneled dialysis catheter placement include but are not limited to: bleeding, infection, central venous injury, pneumothorax, possible venous stenosis, possible malpositioning in the venous system, and possible infections related to long-term catheter presence.   The patient is aware of the risks and agrees to proceed.  Adele Barthel, MD [facility] Office: [phone] Pager: [phone]  03/09/2015, 7:10 AM

## 2015-03-09 NOTE — Anesthesia Postprocedure Evaluation (Signed)
  Anesthesia Post-op Note  Patient: Mark [name]  Procedure(s) Performed: Procedure(s): INSERTION OF DIALYSIS CATHETER (N/A)  Patient Location: PACU  Anesthesia Type:MAC  Level of Consciousness: awake, alert , oriented and patient cooperative  Airway and Oxygen Therapy: Patient Spontanous Breathing  Post-op Pain: none  Post-op Assessment: Post-op Vital signs reviewed, Patient's Cardiovascular Status Stable, Respiratory Function Stable, Patent Airway, No signs of Nausea or vomiting and Pain level controlled              Post-op Vital Signs: Reviewed and stable  Last Vitals:  Filed Vitals:   03/09/15 0915  BP: 119/57  Pulse: 73  Temp: 36.4 C  Resp: 13    Complications: No apparent anesthesia complications

## 2015-03-09 NOTE — Transfer of Care (Signed)
Immediate Anesthesia Transfer of Care Note  Patient: Mark [name]  Procedure(s) Performed: Procedure(s): INSERTION OF DIALYSIS CATHETER (N/A)  Patient Location: PACU  Anesthesia Type:MAC  Level of Consciousness: awake, alert , oriented and patient cooperative  Airway & Oxygen Therapy: Patient Spontanous Breathing  Post-op Assessment: Report given to RN and Post -op Vital signs reviewed and stable  Post vital signs: Reviewed and stable  Last Vitals:  Filed Vitals:   03/09/15 0603  BP: 112/74  Pulse: 70  Temp: 36.5 C  Resp: 18    Complications: No apparent anesthesia complications

## 2015-03-09 NOTE — Op Note (Signed)
    OPERATIVE NOTE  PROCEDURE: 1. Right internal jugular vein tunneled dialysis catheter placement 2. Right internal jugular vein cannulation under ultrasound guidance  PRE-OPERATIVE DIAGNOSIS: end-stage renal failure  POST-OPERATIVE DIAGNOSIS: same as above  SURGEON: Adele Barthel, MD  ANESTHESIA: local and MAC  ESTIMATED BLOOD LOSS: 30 cc  FINDING(S): 1.  Tips of the catheter in the right atrium on fluoroscopy 2.  No obvious pneumothorax on fluoroscopy  SPECIMEN(S):  none  INDICATIONS:   Mark [name] is a 59 y.o. male who presents with end stage renal disease.  The patient presents for tunneled dialysis catheter placement.  The patient is aware the risks of tunneled dialysis catheter placement include but are not limited to: bleeding, infection, central venous injury, pneumothorax, possible venous stenosis, possible malpositioning in the venous system, and possible infections related to long-term catheter presence.  The patient was aware of these risks and agreed to proceed.  DESCRIPTION: After written full informed consent was obtained from the patient, the patient was taken back to the operating room.  Prior to induction, the patient was given IV antibiotics.  After obtaining adequate sedation, the patient was prepped and draped in the standard fashion for a chest or neck tunneled dialysis catheter placement.   The cannulation site, the catheter exit site, and tract for the subcutaneous tunnel were then anesthestized with a total of 30 cc of 1% Lidocaine with epinepherine.  Under ultrasound guidance, the right internal jugular vein was cannulated with the 18 gauge needle.  A J-wire was then placed down into the right ventricle under fluoroscopic guidance.  The wire was then secured in place with a clamp to the drapes.  I then made stab incisions at the neck and exit sites.   I dissected from the exit site to the cannulation site with a metal tunneler.   The subcutaneous tunnel was  dilated by passing a plastic dilator over the metal dissector. The wire was then unclamped and I removed the needle.  The skin tract and venotomy was dilated serially with dilators.  Finally, the dilator-sheath was placed under fluoroscopic guidance into the superior vena cava.  The dilator and wire were removed.  A 23 cm Diatek catheter was placed under fluoroscopic guidance down into the right atrium.  The sheath was broken and peeled away while holding the catheter cuff at the level of the skin.  The back end of this catheter was transected, revealing the two lumens of this catheter.  The ports were docked onto these two lumens.  The catheter hub was then screwed into place.  Each port was tested by aspirating and flushing.  No resistance was noted.  Each port was then thoroughly flushed with heparinized saline.  The catheter was secured in placed with two interrupted stitches of 3-0 Nylon tied to the catheter.  The neck incision was closed with a U-stitch of 4-0 Monocryl.  The neck and chest incision were cleaned and sterile bandages applied.  Each port was then loaded with concentrated heparin (1000 Units/mL) at the manufacturer recommended volumes to each port.  Sterile caps were applied to each port.  On completion fluoroscopy, the tips of the catheter were in the right atrium, and there was no evidence of pneumothorax.   COMPLICATIONS: none  CONDITION: stable   Adele Barthel, MD [facility] [place] Office: [phone] Pager: [phone]  03/09/2015, 8:07 AM

## 2015-03-09 NOTE — Anesthesia Preprocedure Evaluation (Addendum)
Anesthesia Evaluation  Patient identified by MRN, date of birth, ID band Patient awake    Reviewed: Allergy & Precautions, NPO status , Patient's Chart, lab work & pertinent test results  History of Anesthesia Complications Negative for: history of anesthetic complications  Airway Mallampati: II  TM Distance: >3 FB Neck ROM: Full    Dental  (+) Partial Upper, Dental Advisory Given   Pulmonary sleep apnea (does not use CPAP) , COPD,  COPD inhaler, Current Smoker,    breath sounds clear to auscultation       Cardiovascular hypertension, Pt. on medications (-) angina Rhythm:Regular Rate:Normal  9/16 ECHO: EF 35% to 40%. Aortic valve: There was very mild stenosis   Neuro/Psych negative neurological ROS     GI/Hepatic Neg liver ROS, GERD  Medicated and Controlled,  Endo/Other  diabetes (glu 80), Oral Hypoglycemic AgentsMorbid obesity  Renal/GU ESRFRenal disease (K+ 4.8, no dialysis yet)     Musculoskeletal  (+) Arthritis , Osteoarthritis,    Abdominal (+) + obese,   Peds  Hematology  (+) Blood dyscrasia (Hb 9.2), ,   Anesthesia Other Findings   Reproductive/Obstetrics                           Anesthesia Physical Anesthesia Plan  ASA: III  Anesthesia Plan: MAC   Post-op Pain Management:    Induction:   Airway Management Planned: Simple Face Mask and Natural Airway  Additional Equipment:   Intra-op Plan:   Post-operative Plan:   Informed Consent: I have reviewed the patients History and Physical, chart, labs and discussed the procedure including the risks, benefits and alternatives for the proposed anesthesia with the patient or authorized representative who has indicated his/her understanding and acceptance.   Dental advisory given  Plan Discussed with: CRNA and Surgeon  Anesthesia Plan Comments: (Plan routine monitors, MAC)        Anesthesia Quick Evaluation

## 2015-03-10 ENCOUNTER — Other Ambulatory Visit: Payer: Self-pay

## 2015-03-10 ENCOUNTER — Encounter (HOSPITAL_COMMUNITY): Payer: Self-pay | Admitting: Vascular Surgery

## 2015-03-16 ENCOUNTER — Encounter: Payer: Self-pay | Admitting: Cardiovascular Disease

## 2015-03-16 ENCOUNTER — Encounter: Payer: Self-pay | Admitting: *Deleted

## 2015-03-16 ENCOUNTER — Ambulatory Visit (INDEPENDENT_AMBULATORY_CARE_PROVIDER_SITE_OTHER): Payer: Managed Care, Other (non HMO) | Admitting: Cardiovascular Disease

## 2015-03-16 VITALS — BP 160/80 | HR 68 | Ht 72.0 in | Wt 258.0 lb

## 2015-03-16 DIAGNOSIS — I10 Essential (primary) hypertension: Secondary | ICD-10-CM | POA: Diagnosis not present

## 2015-03-16 DIAGNOSIS — Z992 Dependence on renal dialysis: Secondary | ICD-10-CM

## 2015-03-16 DIAGNOSIS — I5021 Acute systolic (congestive) heart failure: Secondary | ICD-10-CM

## 2015-03-16 DIAGNOSIS — I429 Cardiomyopathy, unspecified: Secondary | ICD-10-CM | POA: Diagnosis not present

## 2015-03-16 DIAGNOSIS — Z87898 Personal history of other specified conditions: Secondary | ICD-10-CM

## 2015-03-16 DIAGNOSIS — G4733 Obstructive sleep apnea (adult) (pediatric): Secondary | ICD-10-CM

## 2015-03-16 DIAGNOSIS — N186 End stage renal disease: Secondary | ICD-10-CM

## 2015-03-16 DIAGNOSIS — Z720 Tobacco use: Secondary | ICD-10-CM

## 2015-03-16 DIAGNOSIS — Z9289 Personal history of other medical treatment: Secondary | ICD-10-CM

## 2015-03-16 DIAGNOSIS — R001 Bradycardia, unspecified: Secondary | ICD-10-CM

## 2015-03-16 MED ORDER — HYDRALAZINE HCL 25 MG PO TABS: 25.0000 mg | ORAL_TABLET | Freq: Three times a day (TID) | ORAL | Status: DC

## 2015-03-16 NOTE — Progress Notes (Signed)
Patient ID: Mark [name], male   DOB: 1956/05/03, 59 y.o.   MRN: [id number]      SUBJECTIVE: The patient returns for follow-up after being hospitalized in September for acute systolic congestive heart failure. Echocardiogram on 02/12/15 demonstrated moderately reduced left ventricle systolic function, EF 123456, moderate LVH, and very mild aortic stenosis.  LVEF had been normal by echo on 02/12/14.  He was unable to be started on beta blockers due to his long history of bradycardia and neither an ACE inhibitor or ARB could be started due to his ESRD. He stopped taking isosorbide mononitrate on his own due to headaches.   Troponins were normal while hospitalized.  Lipid panel on 9/10 showed total cholesterol 111, triglycerides 54, HDL 31, LDL 69. HbA1c 6%.  He dialyzed today. He denies exertional chest pain and shortness of breath. Denies leg swelling.  Review of Systems: As per "subjective", otherwise negative.  No Known Allergies  Current Outpatient Prescriptions  Medication Sig Dispense Refill  . ALPRAZolam (XANAX) 1 MG tablet Take 1 mg by mouth daily as needed for anxiety.     Marland Kitchen atorvastatin (LIPITOR) 40 MG tablet Take 40 mg by mouth at bedtime.    . furosemide (LASIX) 40 MG tablet Take 40 mg by mouth 2 (two) times daily.    Marland Kitchen glipiZIDE (GLUCOTROL XL) 5 MG 24 hr tablet Take 1 tablet (5 mg total) by mouth daily before breakfast. 30 tablet 0  . HYDROcodone-acetaminophen (NORCO) 10-325 MG per tablet Take 1 tablet by mouth 2 (two) times daily as needed for severe pain.   0  . metolazone (ZAROXOLYN) 5 MG tablet Take 5 mg by mouth daily as needed (for infrequent urination).     Marland Kitchen POLY-IRON 150 150 MG capsule Take 150 mg by mouth 2 (two) times daily.     . QC CALCIUM FAST DISSOLUTION 600 MG TABS tablet Take 600 mg by mouth 2 (two) times daily with a meal.   4  . sodium bicarbonate 650 MG tablet Take 650 mg by mouth 3 (three) times daily.   4  . traZODone (DESYREL) 50 MG tablet Take 50 mg by  mouth at bedtime as needed for sleep.     . isosorbide mononitrate (IMDUR) 30 MG 24 hr tablet Take 1 tablet (30 mg total) by mouth daily. 30 tablet 0   No current facility-administered medications for this visit.    Past Medical History  Diagnosis Date  . Back pain   . Hypertension   . Hypercholesteremia   . Anxiety     occas. panic attack, takes xanax occas  . Hematuria     being followed by Dr. Hinda Lenis for decreased kidney function   . Chronic kidney disease   . GERD (gastroesophageal reflux disease)     otc- pepcid , approx. every other  day    . Arthritis     herniated disc, lumbar   . Cancer ([place])     - skin ca on face- removed   . Sleep apnea     test aborted, due to not able to relax , since the aborted test he had surgery for gallbladder & he reports that he was told that he has sleep apnea   . Pneumonia     "walking"  . Diabetes mellitus without complication ([place])     type 2  . H/O acute respiratory failure   . History of blood product transfusion 02/13/2014    After having back surgery    Past Surgical  History  Procedure Laterality Date  . Cholecystectomy    . Tonsillectomy    . Eye surgery      cataracts remove, bilateral, w/IOL.  . Lumbar laminectomy/decompression microdiscectomy Right 02/13/2014    Procedure: RIGHT LUMBAR FOUR-FIVE, RIGHT LUMBAR FIVE- SACRAL ONE LUMBAR LAMINECTOMY/DECOMPRESSION MICRODISCECTOMY ;  Surgeon: Ashok Pall, MD;  Location: [place] [facility];  Service: Neurosurgery;  Laterality: Right;  Right L4-5 and Right L5-S1 diskectomies  . Av fistula placement Left 02/02/2015    Procedure: LEFT ARM RADIOCEPHALIC ARTERIOVENOUS (AV) FISTULA CREATION;  Surgeon: Conrad Leesburg, MD;  Location: [place];  Service: Vascular;  Laterality: Left;  . Insertion of dialysis catheter N/A 03/09/2015    Procedure: INSERTION OF DIALYSIS CATHETER;  Surgeon: Conrad , MD;  Location: [facility];  Service: Vascular;  Laterality: N/A;    Social History   Social History  .  Marital Status: Married    Spouse Name: N/A  . Number of Children: N/A  . Years of Education: N/A   Occupational History  . Not on file.   Social History Main Topics  . Smoking status: Current Every Day Smoker -- 0.50 packs/day for 40 years    Types: Cigarettes    Start date: 10/17/1973    Last Attempt to Quit: 01/29/2014  . Smokeless tobacco: Never Used  . Alcohol Use: No  . Drug Use: No  . Sexual Activity: Not on file   Other Topics Concern  . Not on file   Social History Narrative     Filed Vitals:   03/16/15 1524  BP: 160/80  Pulse: 68  Height: 6' (1.829 m)  Weight: 258 lb (117.028 kg)  SpO2: 98%    PHYSICAL EXAM General: NAD HEENT: Normal. Neck: No JVD, no thyromegaly. Lungs: Clear to auscultation bilaterally with normal respiratory effort. CV: Regular rate and rhythm, normal S1/S2, no S3/S4, no murmur. No pretibial or periankle edema.    Abdomen: Soft, obese, no distention.  Neurologic: Alert and oriented x 3.  Psych: Normal affect. Skin: Normal. Musculoskeletal: Normal range of motion, no gross deformities. Extremities: No clubbing or cyanosis.   ECG: Most recent ECG reviewed.      ASSESSMENT AND PLAN: 1. Chronic systolic heart failure/cardiomyopathy: LVEF had been normal in 02/2014. Will obtain Lexiscan Cardiolite stress test to evaluate for ischemic etiology. Did not tolerate long-acting nitrates. Unable to use beta blocker due to long history of bradycardia and unable to use Ace inhibitors or ARB's due to end-stage renal disease.  Will start hydralazine 25 mg three times daily for afterload reduction given his elevated blood pressure. Continue Lasix 40 mg twice daily.  2. Hyperlipidemia: Lipids from 02/2015 reviewed above. Continue Lipitor 40 mg daily.  3. Essential HTN: Elevated. Will start hydralazine 25 mg three times daily for afterload reduction. He had been on amlodipine but this was stopped.  4. Bradycardia: HR currently normal.  5. ESRD:  Started hemodialysis last week.   Dispo: f/u 2 months.  Time spent: 40 minutes, of which greater than 50% was spent reviewing symptoms, relevant blood tests and studies, and discussing management plan with the patient.   [name], M.D., F.A.C.C.

## 2015-03-16 NOTE — Patient Instructions (Addendum)
   Begin Hydralazine 25mg  three times per day - new sent to [facility] today. Continue all other medications.   Your physician has requested that you have a lexiscan myoview. For further information please visit [email or web address]. Please follow instruction sheet, as given. Office will contact with results via phone or letter.   Follow up in  2 months.

## 2015-03-22 ENCOUNTER — Encounter (HOSPITAL_COMMUNITY): Payer: Self-pay

## 2015-03-22 ENCOUNTER — Inpatient Hospital Stay (HOSPITAL_COMMUNITY): Admission: RE | Admit: 2015-03-22 | Payer: Managed Care, Other (non HMO) | Source: Ambulatory Visit

## 2015-03-22 ENCOUNTER — Encounter (HOSPITAL_COMMUNITY)
Admission: RE | Admit: 2015-03-22 | Discharge: 2015-03-22 | Disposition: A | Discharge status: Home / Self Care | Payer: Managed Care, Other (non HMO) | Source: Ambulatory Visit | Attending: Cardiovascular Disease | Admitting: Cardiovascular Disease

## 2015-03-22 DIAGNOSIS — I429 Cardiomyopathy, unspecified: Secondary | ICD-10-CM | POA: Insufficient documentation

## 2015-03-22 DIAGNOSIS — I5021 Acute systolic (congestive) heart failure: Secondary | ICD-10-CM | POA: Diagnosis not present

## 2015-03-22 DIAGNOSIS — R9439 Abnormal result of other cardiovascular function study: Secondary | ICD-10-CM | POA: Insufficient documentation

## 2015-03-22 HISTORY — DX: Heart failure, unspecified: I50.9

## 2015-03-22 LAB — NM MYOCAR MULTI W/SPECT W/WALL MOTION / EF
CHL CUP NUCLEAR SSS: 28
CHL CUP RESTING HR STRESS: 56 {beats}/min
CSEPPHR: 114 {beats}/min
LV dias vol: 262 mL
LVSYSVOL: 178 mL
RATE: 0.35
SDS: 12
SRS: 17
TID: 1.14

## 2015-03-22 MED ORDER — TECHNETIUM TC 99M SESTAMIBI GENERIC - CARDIOLITE
10.0000 | Freq: Once | INTRAVENOUS | Status: AC | PRN
  Administered 2015-03-22: 10 via INTRAVENOUS

## 2015-03-22 MED ORDER — REGADENOSON 0.4 MG/5ML IV SOLN
INTRAVENOUS | Status: AC
  Administered 2015-03-22: 0.4 mg via INTRAVENOUS
  Filled 2015-03-22: qty 5

## 2015-03-22 MED ORDER — SODIUM CHLORIDE 0.9 % IJ SOLN
INTRAMUSCULAR | Status: AC
  Administered 2015-03-22: 10 mL via INTRAVENOUS
  Filled 2015-03-22: qty 3

## 2015-03-22 MED ORDER — TECHNETIUM TC 99M SESTAMIBI - CARDIOLITE
30.0000 | Freq: Once | INTRAVENOUS | Status: AC | PRN
  Administered 2015-03-22: 30 via INTRAVENOUS

## 2015-03-23 ENCOUNTER — Encounter: Payer: Self-pay | Admitting: Cardiovascular Disease

## 2015-03-23 ENCOUNTER — Telehealth: Payer: Self-pay | Admitting: *Deleted

## 2015-03-23 ENCOUNTER — Encounter (HOSPITAL_COMMUNITY): Payer: Self-pay | Admitting: Vascular Surgery

## 2015-03-23 ENCOUNTER — Ambulatory Visit (INDEPENDENT_AMBULATORY_CARE_PROVIDER_SITE_OTHER): Payer: Managed Care, Other (non HMO) | Admitting: Cardiovascular Disease

## 2015-03-23 ENCOUNTER — Telehealth: Payer: Self-pay

## 2015-03-23 ENCOUNTER — Encounter (HOSPITAL_COMMUNITY): Payer: Self-pay | Admitting: *Deleted

## 2015-03-23 ENCOUNTER — Telehealth: Payer: Self-pay | Admitting: Vascular Surgery

## 2015-03-23 VITALS — BP 128/74 | HR 90 | Ht 72.0 in | Wt 259.6 lb

## 2015-03-23 DIAGNOSIS — G4733 Obstructive sleep apnea (adult) (pediatric): Secondary | ICD-10-CM

## 2015-03-23 DIAGNOSIS — I10 Essential (primary) hypertension: Secondary | ICD-10-CM

## 2015-03-23 DIAGNOSIS — R931 Abnormal findings on diagnostic imaging of heart and coronary circulation: Secondary | ICD-10-CM

## 2015-03-23 DIAGNOSIS — R9439 Abnormal result of other cardiovascular function study: Secondary | ICD-10-CM

## 2015-03-23 DIAGNOSIS — Z01810 Encounter for preprocedural cardiovascular examination: Secondary | ICD-10-CM

## 2015-03-23 DIAGNOSIS — N186 End stage renal disease: Secondary | ICD-10-CM | POA: Diagnosis not present

## 2015-03-23 DIAGNOSIS — I5022 Chronic systolic (congestive) heart failure: Secondary | ICD-10-CM | POA: Diagnosis not present

## 2015-03-23 DIAGNOSIS — I429 Cardiomyopathy, unspecified: Secondary | ICD-10-CM | POA: Diagnosis not present

## 2015-03-23 DIAGNOSIS — Z992 Dependence on renal dialysis: Secondary | ICD-10-CM

## 2015-03-23 DIAGNOSIS — E785 Hyperlipidemia, unspecified: Secondary | ICD-10-CM

## 2015-03-23 DIAGNOSIS — R9431 Abnormal electrocardiogram [ECG] [EKG]: Secondary | ICD-10-CM

## 2015-03-23 MED ORDER — NITROGLYCERIN 0.4 MG SL SUBL
0.4000 mg | SUBLINGUAL_TABLET | SUBLINGUAL | Status: DC | PRN
Start: 2015-03-23 — End: 2018-04-12

## 2015-03-23 NOTE — Progress Notes (Addendum)
Mark [name] reports that fasting CBG's run around 110.  Patient denies chest pain, does get short of breath with exertion.  Patient did stop Isosorbide due to headaches and his cardiologist is aware.  Patient had a stress test 03/22/15, 'high risk', to follow up with cardiologist 03/30/15.

## 2015-03-23 NOTE — Telephone Encounter (Signed)
Patient notified.  OV scheduled for 3:20 this afternoon in our [place] office.  03/31/2015 OV has been cancelled.    Colletta Maryland with [facility] also made aware.

## 2015-03-23 NOTE — Telephone Encounter (Signed)
-----   Message from Denman George, RN sent at 03/23/2015 12:06 PM EDT ----- Regarding: cancel appt. 10/21 Cancel this pt's appt. With Dr. Bridgett Larsson on 10/21; his left R-C AVF has clotted; was scheduled for surgery 10/19 for new left B-C AVF, but had to cancel due to need of heart cath.  Pt. Knows about 10/21 appt. being cancelled.

## 2015-03-23 NOTE — Progress Notes (Addendum)
Anesthesia Chart Review: SAME DAY WORK-UP.  Patient is a 59 year old male scheduled for brachiocephalic AVF tomorrow (first case) by Dr. Bridgett Larsson. Anesthesia is posted for MAC. He is s/p insertion of right IJ tunneled dialysis catheter on 03/09/15.  History includes HTN, hypercholesterolemia, DM2, ESRD (started hemodialysis earlier this month), OSA, anxiety, GERD, skin cancer, obesity, right L4-S1 microdiskectomies '15, cholecystectomy, tonsillectomy, left radiocephalic AVF [id number]. Admitted 02/12/15-02/15/15 for acute respiratory failure in the setting of decompensated renal disease. Echo showed a decrease in EF from 55% to 35-40% since 02/2014. He was seen by cardiologist Dr. Rayann Heman with plans for out-patient follow-up with Dr. Bronson Ing with stress test which was done yesterday and was high risk (see below). Since his 02/2015 admission he has also started hemodialysis via tunneled catheter. He denied chest pain but did report exertional dyspnea during his PAT RN phone interview today.  Nephrologist is Dr. Hinda Lenis. PCP is Dr. Matthias Hughs. Cardiologist is Dr. Bronson Ing, last visit 03/16/15 with next visit scheduled for 03/30/15 to discuss stress test results.   Medications include: Xanax, Lipitor, Lasix, Norco, hydralazine, glipizide, trazodone, poly-iron, Zantac. He has not been a candidate for b-blockers due to bradycardia, and had stopped isosorbide mononitrate due to headaches. ACEI/ARB have not been used due to ESRD.   03/22/15 Nuclear stress test:  - Horizontal ST segment depression ST segment depression was noted during stress in the II, III, V4 and V5 leads, and returning to baseline after 1-5 minutes of recovery. - This is a high risk study. - Findings consistent with prior myocardial infarction with peri-infarct ischemia. - The left ventricular ejection fraction is moderately decreased (30-44%). Large inferior wall infarct from apex to base and small area of septal and apical ischemia. 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   02/12/15 Echo: Study Conclusions - Left ventricle: The cavity size was normal. Wall thickness was increased in a pattern of moderate LVH. Systolic function was moderately reduced. The estimated ejection fraction was in the range of 35% to 40%. - Aortic valve: There was very mild stenosis. Valve area (VTI): 1.98 cm^2. Valve area (Vmax): 1.99 cm^2. Valve area (Vmean): 1.99 cm^2. - Mitral valve: Valve area by continuity equation (using LVOT flow): 1.78 cm^2. - Left atrium: The atrium was moderately dilated. - Pulmonary arteries: Systolic pressure was mildly increased. PA peak pressure: 35 mm Hg (S). ([facility] 02/12/14 echo showed: The estimated ejection fraction was approximately 55%. Wall motion was normal; there were no regional wall motion abnormalities.)  02/12/15 EKG: SB at 58 bpm, T wave abnormality, consider lateral ischemia, prolonged QT. High lateral T wave inversion is more prominent when compared to 02/06/14 tracing.  03/09/15 1V CXR: IMPRESSION: Right dialysis catheter tip in the SVC. No pneumothorax. Mild cardiomegaly. No acute cardiopulmonary disease.  He is for labs on arrival.   Discussed with anesthesiologist Dr. Tamala Julian. Patient had high risk stress test just yesterday and has not seen Dr. Bronson Ing yet to discuss what the next step will be. Since he is on HD now would anticipate that he will eventually need a cardiac cath. He does not have permanent HD access yet. We will need Dr. Bronson Ing to weigh in prior to proceeding with planned surgery. [facility] RN Colletta Maryland notified.   George Hugh [facility] Short Stay Center/Anesthesiology Phone [phone] 03/23/2015 10:52 AM  Addendum: Received update from [place]. Case will be cancelled. Dr. Bronson Ing plans to proceed with cath in the near future.  George Hugh [facility] Short Stay Center/Anesthesiology Phone [phone] 03/23/2015 1:35 PM

## 2015-03-23 NOTE — Patient Instructions (Addendum)
Your physician has requested that you have a cardiac catheterization on Thursday Oct 20 th at 9 am with Dr.Harding. Cardiac catheterization is used to diagnose and/or treat various heart conditions. Doctors may recommend this procedure for a number of different reasons. The most common reason is to evaluate chest pain. Chest pain can be a symptom of coronary artery disease (CAD), and cardiac catheterization can show whether plaque is narrowing or blocking your heart's arteries. This procedure is also used to evaluate the valves, as well as measure the blood flow and oxygen levels in different parts of your heart. For further information please visit [email or web address]. Please follow instruction sheet, as given.  You will be admitted tomorrow (Wednesday) to be evaluated by the nephrologist. [facility] [phone] will call you and tell when to arrive at admitting      Please start Aspirin 81 mg daily    Take nitroglycerine as directed for chest pain  Nitroglycerin sublingual tablets What is this medicine? NITROGLYCERIN (nye troe GLI ser in) is a type of vasodilator. It relaxes blood vessels, increasing the blood and oxygen supply to your heart. This medicine is used to relieve chest pain caused by angina. It is also used to prevent chest pain before activities like climbing stairs, going outdoors in cold weather, or sexual activity. This medicine may be used for other purposes; ask your health care provider or pharmacist if you have questions. What should I tell my health care provider before I take this medicine? They need to know if you have any of these conditions: -anemia -head injury, recent stroke, or bleeding in the brain -liver disease -previous heart attack -an unusual or allergic reaction to nitroglycerin, other medicines, foods, dyes, or preservatives -pregnant or trying to get pregnant -breast-feeding How should I use this medicine? Take this medicine by mouth as  needed. At the first sign of an angina attack (chest pain or tightness) place one tablet under your tongue. You can also take this medicine 5 to 10 minutes before an event likely to produce chest pain. Follow the directions on the prescription label. Let the tablet dissolve under the tongue. Do not swallow whole. Replace the dose if you accidentally swallow it. It will help if your mouth is not dry. Saliva around the tablet will help it to dissolve more quickly. Do not eat or drink, smoke or chew tobacco while a tablet is dissolving. If you are not better within 5 minutes after taking ONE dose of nitroglycerin, call 9-1-1 immediately to seek emergency medical care. Do not take more than 3 nitroglycerin tablets over 15 minutes. If you take this medicine often to relieve symptoms of angina, your doctor or health care professional may provide you with different instructions to manage your symptoms. If symptoms do not go away after following these instructions, it is important to call 9-1-1 immediately. Do not take more than 3 nitroglycerin tablets over 15 minutes. Talk to your pediatrician regarding the use of this medicine in children. Special care may be needed. Overdosage: If you think you have taken too much of this medicine contact a poison control center or emergency room at once. NOTE: This medicine is only for you. Do not share this medicine with others. What if I miss a dose? This does not apply. This medicine is only used as needed. What may interact with this medicine? Do not take this medicine with any of the following medications: -certain migraine medicines like ergotamine and dihydroergotamine (DHE) -medicines used to treat  erectile dysfunction like sildenafil, tadalafil, and vardenafil -riociguat This medicine may also interact with the following medications: -alteplase -aspirin -heparin -medicines for high blood pressure -medicines for mental depression -other medicines used to treat  angina -phenothiazines like chlorpromazine, mesoridazine, prochlorperazine, thioridazine This list may not describe all possible interactions. Give your health care provider a list of all the medicines, herbs, non-prescription drugs, or dietary supplements you use. Also tell them if you smoke, drink alcohol, or use illegal drugs. Some items may interact with your medicine. What should I watch for while using this medicine? Tell your doctor or health care professional if you feel your medicine is no longer working. Keep this medicine with you at all times. Sit or lie down when you take your medicine to prevent falling if you feel dizzy or faint after using it. Try to remain calm. This will help you to feel better faster. If you feel dizzy, take several deep breaths and lie down with your feet propped up, or bend forward with your head resting between your knees. You may get drowsy or dizzy. Do not drive, use machinery, or do anything that needs mental alertness until you know how this drug affects you. Do not stand or sit up quickly, especially if you are an older patient. This reduces the risk of dizzy or fainting spells. Alcohol can make you more drowsy and dizzy. Avoid alcoholic drinks. Do not treat yourself for coughs, colds, or pain while you are taking this medicine without asking your doctor or health care professional for advice. Some ingredients may increase your blood pressure. What side effects may I notice from receiving this medicine? Side effects that you should report to your doctor or health care professional as soon as possible: -blurred vision -dry mouth -skin rash -sweating -the feeling of extreme pressure in the head -unusually weak or tired Side effects that usually do not require medical attention (report to your doctor or health care professional if they continue or are bothersome): -flushing of the face or neck -headache -irregular heartbeat, palpitations -nausea,  vomiting This list may not describe all possible side effects. Call your doctor for medical advice about side effects. You may report side effects to FDA at 1-800-FDA-1088. Where should I keep my medicine? Keep out of the reach of children. Store at room temperature between 20 and 25 degrees C (68 and 77 degrees F). Store in [facility]. Protect from light and moisture. Keep tightly closed. Throw away any unused medicine after the expiration date. NOTE: This sheet is a summary. It may not cover all possible information. If you have questions about this medicine, talk to your doctor, pharmacist, or health care provider.    2016, Elsevier/Gold Standard. (2013-03-20 17:57:36)       Thank you for choosing [facility] !

## 2015-03-23 NOTE — Progress Notes (Signed)
Patient ID: Mark [name], male   DOB: 1955/11/10, 59 y.o.   MRN: [id number]      SUBJECTIVE: The patient returns for follow-up after undergoing cardiovascular testing performed for the evaluation of CHF.  Nuclear stress testing on 03/22/15 demonstrated a high risk study with significant ST segment depression and evidence for prior myocardial infarction with peri-infarct ischemia. The infarct involved the inferior wall from the apex to the base with a small area of septal and apical ischemia, all suggestive of multivessel disease.  He was initially scheduled to undergo a left arm brachiocephalic fistula tomorrow, but due to the high risk stress test, it has been cancelled.  Denies chest pain and shortness of breath. Has had back pain, worse with exertion, primarily between the shoulder blades. Does have chronic back issues as well.   Review of Systems: As per "subjective", otherwise negative.  No Known Allergies  Current Outpatient Prescriptions  Medication Sig Dispense Refill  . ALPRAZolam (XANAX) 1 MG tablet Take 1 mg by mouth daily as needed for anxiety.     Marland Kitchen atorvastatin (LIPITOR) 40 MG tablet Take 40 mg by mouth at bedtime.    . furosemide (LASIX) 40 MG tablet Take 80 mg by mouth 2 (two) times daily.     Marland Kitchen glipiZIDE (GLUCOTROL XL) 5 MG 24 hr tablet Take 1 tablet (5 mg total) by mouth daily before breakfast. 30 tablet 0  . hydrALAZINE (APRESOLINE) 25 MG tablet Take 1 tablet (25 mg total) by mouth 3 (three) times daily. 90 tablet 6  . HYDROcodone-acetaminophen (NORCO) 10-325 MG per tablet Take 1 tablet by mouth 2 (two) times daily as needed for severe pain.   0  . POLY-IRON 150 150 MG capsule Take 150 mg by mouth 2 (two) times daily.     . QC CALCIUM FAST DISSOLUTION 600 MG TABS tablet Take 600 mg by mouth 2 (two) times daily with a meal.   4  . ranitidine (ZANTAC) 75 MG tablet Take 75 mg by mouth daily.    . sodium bicarbonate 650 MG tablet Take 650 mg by mouth 3 (three) times  daily.   4  . traZODone (DESYREL) 50 MG tablet Take 50 mg by mouth at bedtime as needed for sleep.      No current facility-administered medications for this visit.    Past Medical History  Diagnosis Date  . Back pain   . Hypertension   . Hypercholesteremia   . Anxiety     occas. panic attack, takes xanax occas  . Hematuria     being followed by Dr. Hinda Lenis for decreased kidney function   . Chronic kidney disease   . GERD (gastroesophageal reflux disease)     otc- pepcid , approx. every other  day    . Arthritis     herniated disc, lumbar   . Cancer ([place])     - skin ca on face- removed   . Sleep apnea     test aborted, due to not able to relax , since the aborted test he had surgery for gallbladder & he reports that he was told that he has sleep apnea   . Pneumonia     "walking"  . Diabetes mellitus without complication ([place])     type 2  . H/O acute respiratory failure   . History of blood product transfusion 02/13/2014    After having back surgery  . CHF (congestive heart failure) ([place])   . Shortness of breath dyspnea   .  Complication of anesthesia     " it takes a long time to get over it."   . Fibromyalgia     Past Surgical History  Procedure Laterality Date  . Cholecystectomy    . Tonsillectomy    . Eye surgery      cataracts remove, bilateral, w/IOL.  . Lumbar laminectomy/decompression microdiscectomy Right 02/13/2014    Procedure: RIGHT LUMBAR FOUR-FIVE, RIGHT LUMBAR FIVE- SACRAL ONE LUMBAR LAMINECTOMY/DECOMPRESSION MICRODISCECTOMY ;  Surgeon: Ashok Pall, MD;  Location: [facility];  Service: Neurosurgery;  Laterality: Right;  Right L4-5 and Right L5-S1 diskectomies  . Av fistula placement Left 02/02/2015    Procedure: LEFT ARM RADIOCEPHALIC ARTERIOVENOUS (AV) FISTULA CREATION;  Surgeon: Conrad Casa, MD;  Location: [facility];  Service: Vascular;  Laterality: Left;  . Insertion of dialysis catheter N/A 03/09/2015    Procedure: INSERTION OF DIALYSIS CATHETER;   Surgeon: Conrad Quenemo, MD;  Location: [facility];  Service: Vascular;  Laterality: N/A;  . Back surgery  02/13/14  . Colonoscopy      Social History   Social History  . Marital Status: Married    Spouse Name: N/A  . Number of Children: N/A  . Years of Education: N/A   Occupational History  . Not on file.   Social History Main Topics  . Smoking status: Former Smoker -- 0.50 packs/day for 40 years    Types: Cigarettes    Start date: 10/17/1973    Quit date: 02/07/2015  . Smokeless tobacco: Never Used  . Alcohol Use: No  . Drug Use: No  . Sexual Activity: Not on file   Other Topics Concern  . Not on file   Social History Narrative     Filed Vitals:   03/23/15 1519  BP: 128/74  Pulse: 90  Height: 6' (1.829 m)  Weight: 259 lb 9.6 oz (117.754 kg)  SpO2: 98%    PHYSICAL EXAM General: NAD HEENT: Normal. Neck: No JVD, no thyromegaly. Lungs: Clear to auscultation bilaterally with normal respiratory effort. CV: Regular rate and rhythm, normal S1/S2, no S3/S4, no murmur. No pretibial or periankle edema.  Abdomen: Soft, obese, no distention.  Neurologic: Alert and oriented x 3.  Psych: Normal affect. Skin: Normal. Musculoskeletal: Normal range of motion, no gross deformities. Extremities: No clubbing or cyanosis.   ECG: Most recent ECG reviewed.      ASSESSMENT AND PLAN: 1. Chronic systolic heart failure/cardiomyopathy, EF 35-40%: LVEF had been normal in 02/2014. Given high risk stress test findings documented above, will proceed with coronary angiography. Did not tolerate long-acting nitrates. Unable to use beta blocker due to long history of bradycardia and unable to use ACE inhibitors or ARB's due to end-stage renal disease.  I started hydralazine 25 mg three times daily at his last visit for afterload reduction and given his elevated blood pressure. Continue Lasix 40 mg twice daily.  2. Hyperlipidemia: Lipids from 02/2015 reviewed above. Continue Lipitor 40 mg  daily.  3. Essential HTN: Normalized after initiation of hydralazine 25 mg three times daily for afterload reduction. He had been on amlodipine but this was stopped.  4. Bradycardia: HR currently normal.  5. ESRD/pre-op risk stratification: On hemodialysis. Surgery would be high risk given the multiple abnormalities seen on stress testing. Without knowing coronary anatomy, I would not recommend proceeding with brachiocephalic fistula surgery until coronary angiography is performed.  6. 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 Already on statin therapy. Unable to use beta blockers at present  due to long history of bradycardia. Will prescribe SL nitroglycerin.  Dispo: f/u after cath.  [name], M.D., F.A.C.C.

## 2015-03-23 NOTE — Telephone Encounter (Signed)
Would postpone surgery until I can see, as stress test was high risk. I think he needs a cath beforehand. If he can come to the office today to see me at 3:20, that would be fine as well.

## 2015-03-23 NOTE — Telephone Encounter (Signed)
High risk nuclear study,apt made Wed 10/25 at 2pm with Dr Bronson Ing to discuss

## 2015-03-23 NOTE — Telephone Encounter (Signed)
Need cardiac clearance for Left arm brachiocephalic fistula - scheduled for tomorrow.    Just had abnormal stress test, scheduled to see MD on 03/31/2015 in [place] office to discuss results.

## 2015-03-24 ENCOUNTER — Ambulatory Visit (HOSPITAL_COMMUNITY)
Admission: RE | Admit: 2015-03-24 | Payer: Managed Care, Other (non HMO) | Source: Ambulatory Visit | Admitting: Vascular Surgery

## 2015-03-24 ENCOUNTER — Encounter (HOSPITAL_COMMUNITY): Payer: Self-pay | Admitting: *Deleted

## 2015-03-24 ENCOUNTER — Inpatient Hospital Stay (HOSPITAL_COMMUNITY)
Admission: AD | Admit: 2015-03-24 | Discharge: 2015-03-26 | DRG: 246 | Disposition: A | Discharge status: Home / Self Care | Payer: Managed Care, Other (non HMO) | Source: Ambulatory Visit | Attending: Cardiovascular Disease | Admitting: Cardiovascular Disease

## 2015-03-24 DIAGNOSIS — R9439 Abnormal result of other cardiovascular function study: Secondary | ICD-10-CM | POA: Diagnosis present

## 2015-03-24 DIAGNOSIS — N186 End stage renal disease: Secondary | ICD-10-CM | POA: Insufficient documentation

## 2015-03-24 DIAGNOSIS — Z992 Dependence on renal dialysis: Secondary | ICD-10-CM

## 2015-03-24 DIAGNOSIS — E785 Hyperlipidemia, unspecified: Secondary | ICD-10-CM | POA: Diagnosis present

## 2015-03-24 DIAGNOSIS — G8929 Other chronic pain: Secondary | ICD-10-CM | POA: Diagnosis present

## 2015-03-24 DIAGNOSIS — Z87891 Personal history of nicotine dependence: Secondary | ICD-10-CM

## 2015-03-24 DIAGNOSIS — G4730 Sleep apnea, unspecified: Secondary | ICD-10-CM | POA: Diagnosis present

## 2015-03-24 DIAGNOSIS — G4733 Obstructive sleep apnea (adult) (pediatric): Secondary | ICD-10-CM

## 2015-03-24 DIAGNOSIS — Z955 Presence of coronary angioplasty implant and graft: Secondary | ICD-10-CM

## 2015-03-24 DIAGNOSIS — I10 Essential (primary) hypertension: Secondary | ICD-10-CM

## 2015-03-24 DIAGNOSIS — Z6835 Body mass index (BMI) 35.0-35.9, adult: Secondary | ICD-10-CM

## 2015-03-24 DIAGNOSIS — R001 Bradycardia, unspecified: Secondary | ICD-10-CM | POA: Diagnosis present

## 2015-03-24 DIAGNOSIS — M797 Fibromyalgia: Secondary | ICD-10-CM | POA: Diagnosis present

## 2015-03-24 DIAGNOSIS — I132 Hypertensive heart and chronic kidney disease with heart failure and with stage 5 chronic kidney disease, or end stage renal disease: Secondary | ICD-10-CM | POA: Diagnosis present

## 2015-03-24 DIAGNOSIS — I429 Cardiomyopathy, unspecified: Secondary | ICD-10-CM | POA: Insufficient documentation

## 2015-03-24 DIAGNOSIS — M545 Low back pain: Secondary | ICD-10-CM | POA: Diagnosis present

## 2015-03-24 DIAGNOSIS — I2510 Atherosclerotic heart disease of native coronary artery without angina pectoris: Principal | ICD-10-CM | POA: Diagnosis present

## 2015-03-24 DIAGNOSIS — D649 Anemia, unspecified: Secondary | ICD-10-CM | POA: Diagnosis present

## 2015-03-24 DIAGNOSIS — Z01810 Encounter for preprocedural cardiovascular examination: Secondary | ICD-10-CM | POA: Insufficient documentation

## 2015-03-24 DIAGNOSIS — I5022 Chronic systolic (congestive) heart failure: Secondary | ICD-10-CM | POA: Insufficient documentation

## 2015-03-24 DIAGNOSIS — R9431 Abnormal electrocardiogram [ECG] [EKG]: Secondary | ICD-10-CM

## 2015-03-24 DIAGNOSIS — E1122 Type 2 diabetes mellitus with diabetic chronic kidney disease: Secondary | ICD-10-CM | POA: Diagnosis present

## 2015-03-24 HISTORY — DX: Reserved for inherently not codable concepts without codable children: IMO0001

## 2015-03-24 HISTORY — DX: Adverse effect of unspecified anesthetic, initial encounter: T41.45XA

## 2015-03-24 HISTORY — DX: Other complications of anesthesia, initial encounter: T88.59XA

## 2015-03-24 HISTORY — DX: Atherosclerotic heart disease of native coronary artery without angina pectoris: I25.10

## 2015-03-24 HISTORY — DX: Fibromyalgia: M79.7

## 2015-03-24 LAB — BASIC METABOLIC PANEL
Anion gap: 10 (ref 5–15)
BUN: 27 mg/dL — AB (ref 6–20)
CHLORIDE: 98 mmol/L — AB (ref 101–111)
CO2: 28 mmol/L (ref 22–32)
CREATININE: 4.43 mg/dL — AB (ref 0.61–1.24)
Calcium: 8.6 mg/dL — ABNORMAL LOW (ref 8.9–10.3)
GFR calc Af Amer: 15 mL/min — ABNORMAL LOW (ref >60)
GFR calc non Af Amer: 13 mL/min — ABNORMAL LOW (ref >60)
Glucose, Bld: 131 mg/dL — ABNORMAL HIGH (ref 65–99)
POTASSIUM: 4.2 mmol/L (ref 3.5–5.1)
SODIUM: 136 mmol/L (ref 135–145)

## 2015-03-24 LAB — PROTIME-INR
INR: 1.05 (ref 0.00–1.49)
Prothrombin Time: 13.9 seconds (ref 11.6–15.2)

## 2015-03-24 LAB — CBC
HCT: 28.5 % — ABNORMAL LOW (ref 39.0–52.0)
Hemoglobin: 9.2 g/dL — ABNORMAL LOW (ref 13.0–17.0)
MCH: 28.9 pg (ref 26.0–34.0)
MCHC: 32.3 g/dL (ref 30.0–36.0)
MCV: 89.6 fL (ref 78.0–100.0)
PLATELETS: 135 10*3/uL — AB (ref 150–400)
RBC: 3.18 MIL/uL — ABNORMAL LOW (ref 4.22–5.81)
RDW: 14 % (ref 11.5–15.5)
WBC: 5.2 10*3/uL (ref 4.0–10.5)

## 2015-03-24 LAB — GLUCOSE, CAPILLARY
GLUCOSE-CAPILLARY: 168 mg/dL — AB (ref 65–99)
GLUCOSE-CAPILLARY: 162 mg/dL — AB (ref 65–99)

## 2015-03-24 SURGERY — ARTERIOVENOUS (AV) FISTULA CREATION
Anesthesia: Monitor Anesthesia Care | Laterality: Left

## 2015-03-24 MED ORDER — GLIPIZIDE ER 5 MG PO TB24
5.0000 mg | ORAL_TABLET | Freq: Every day | ORAL | Status: DC
  Administered 2015-03-26: 5 mg via ORAL
  Filled 2015-03-24 (×3): qty 1

## 2015-03-24 MED ORDER — HYDRALAZINE HCL 25 MG PO TABS
25.0000 mg | ORAL_TABLET | Freq: Three times a day (TID) | ORAL | Status: DC
  Administered 2015-03-24 – 2015-03-26 (×4): 25 mg via ORAL
  Filled 2015-03-24 (×4): qty 1

## 2015-03-24 MED ORDER — SODIUM CHLORIDE 0.9 % IJ SOLN
3.0000 mL | Freq: Two times a day (BID) | INTRAMUSCULAR | Status: DC
  Administered 2015-03-24 – 2015-03-25 (×3): 3 mL via INTRAVENOUS

## 2015-03-24 MED ORDER — INSULIN ASPART 100 UNIT/ML ~~LOC~~ SOLN
0.0000 [IU] | Freq: Three times a day (TID) | SUBCUTANEOUS | Status: DC
Start: 2015-03-25 — End: 2015-03-26
  Administered 2015-03-25: 3 [IU] via SUBCUTANEOUS
  Administered 2015-03-26: 5 [IU] via SUBCUTANEOUS

## 2015-03-24 MED ORDER — SODIUM CHLORIDE 0.9 % IV SOLN: 250.0000 mL | INTRAVENOUS | Status: DC | PRN

## 2015-03-24 MED ORDER — SODIUM BICARBONATE 650 MG PO TABS: 650.0000 mg | ORAL_TABLET | Freq: Three times a day (TID) | ORAL | Status: DC

## 2015-03-24 MED ORDER — FUROSEMIDE 80 MG PO TABS
80.0000 mg | ORAL_TABLET | Freq: Every day | ORAL | Status: DC
  Administered 2015-03-24: 80 mg via ORAL
  Filled 2015-03-24: qty 1

## 2015-03-24 MED ORDER — SODIUM CHLORIDE 0.9 % IJ SOLN: 3.0000 mL | INTRAMUSCULAR | Status: DC | PRN

## 2015-03-24 MED ORDER — TRAZODONE HCL 50 MG PO TABS: 50.0000 mg | ORAL_TABLET | Freq: Every evening | ORAL | Status: DC | PRN

## 2015-03-24 MED ORDER — CALCIUM CARBONATE 1250 (500 CA) MG PO TABS
600.0000 mg | ORAL_TABLET | Freq: Two times a day (BID) | ORAL | Status: DC
  Administered 2015-03-24: 625 mg via ORAL
  Filled 2015-03-24: qty 0.5
  Filled 2015-03-24: qty 2
  Filled 2015-03-24: qty 0.5

## 2015-03-24 MED ORDER — SODIUM CHLORIDE 0.9 % IV SOLN
INTRAVENOUS | Status: DC
  Administered 2015-03-25: via INTRAVENOUS

## 2015-03-24 MED ORDER — ALPRAZOLAM 0.5 MG PO TABS
1.0000 mg | ORAL_TABLET | Freq: Every day | ORAL | Status: DC | PRN
  Administered 2015-03-25: 1 mg via ORAL

## 2015-03-24 MED ORDER — ASPIRIN 81 MG PO CHEW
81.0000 mg | CHEWABLE_TABLET | ORAL | Status: AC
  Administered 2015-03-25: 81 mg via ORAL
  Filled 2015-03-24: qty 1

## 2015-03-24 MED ORDER — ATORVASTATIN CALCIUM 40 MG PO TABS
40.0000 mg | ORAL_TABLET | Freq: Every day | ORAL | Status: DC
  Administered 2015-03-24 – 2015-03-25 (×2): 40 mg via ORAL
  Filled 2015-03-24 (×2): qty 1

## 2015-03-24 MED ORDER — ASPIRIN EC 81 MG PO TBEC
81.0000 mg | DELAYED_RELEASE_TABLET | Freq: Every day | ORAL | Status: DC
  Administered 2015-03-26: 81 mg via ORAL
  Filled 2015-03-24: qty 1

## 2015-03-24 MED ORDER — SODIUM CHLORIDE 0.9 % IJ SOLN
3.0000 mL | Freq: Two times a day (BID) | INTRAMUSCULAR | Status: DC
  Administered 2015-03-24: 3 mL via INTRAVENOUS

## 2015-03-24 MED ORDER — POLYSACCHARIDE IRON COMPLEX 150 MG PO CAPS
150.0000 mg | ORAL_CAPSULE | Freq: Two times a day (BID) | ORAL | Status: DC
  Administered 2015-03-24 – 2015-03-26 (×3): 150 mg via ORAL
  Filled 2015-03-24 (×4): qty 1

## 2015-03-24 MED ORDER — FUROSEMIDE 80 MG PO TABS: 80.0000 mg | ORAL_TABLET | Freq: Two times a day (BID) | ORAL | Status: DC

## 2015-03-24 MED ORDER — NITROGLYCERIN 0.4 MG SL SUBL: 0.4000 mg | SUBLINGUAL_TABLET | SUBLINGUAL | Status: DC | PRN

## 2015-03-24 NOTE — Consult Note (Addendum)
Renal Service Consult Note [facility]  Mark [name] 03/24/2015 [place] Requesting Physician:  Dr Bronson Ing  Reason for Consult:  ESRD pt with +stress test for heart cath HPI: The patient is a 59 y.o. year-old with hx of HTN, HL, DM2 and recent new start to ESRD about 2 weeks ago with tunneled HD cath.  Has a LUA avf that didn't mature and is supposed to have a LUA avf done by [facility] soon.  Per family pt had a recent MI and had OP stress test done recently that was + and he is admitted for heart cath procedure.   He had no complaints.  Gets HD at [facility] on TTS schedule.   ROS  no cough, CP, sob  no n/v/d no abd pain  no HA, blurred vision  no itching  no sore throat  Past Medical History  Past Medical History  Diagnosis Date  . Back pain   . Hypertension   . Hypercholesteremia   . Anxiety     occas. panic attack, takes xanax occas  . Hematuria     being followed by Dr. Hinda Lenis for decreased kidney function   . Chronic kidney disease   . GERD (gastroesophageal reflux disease)     otc- pepcid , approx. every other  day    . Arthritis     herniated disc, lumbar   . 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 Sleep apnea     test aborted, due to not able to relax , since the aborted test he had surgery for gallbladder & he reports that he was told that he has sleep apnea   . Pneumonia     "walking"  . Diabetes mellitus without complication ([place])     type 2  . H/O acute respiratory failure   . History of blood product transfusion 02/13/2014    After having back surgery  . CHF (congestive heart failure) ([place])   . Shortness of breath dyspnea   . Complication of anesthesia     " it takes a long time to get over it."   . Fibromyalgia    Past Surgical History  Past Surgical History  Procedure Laterality Date  . Cholecystectomy    . Tonsillectomy    . Eye surgery      cataracts remove, bilateral, w/IOL.  . Lumbar  laminectomy/decompression microdiscectomy Right 02/13/2014    Procedure: RIGHT LUMBAR FOUR-FIVE, RIGHT LUMBAR FIVE- SACRAL ONE LUMBAR LAMINECTOMY/DECOMPRESSION MICRODISCECTOMY ;  Surgeon: Ashok Pall, MD;  Location: [facility];  Service: Neurosurgery;  Laterality: Right;  Right L4-5 and Right L5-S1 diskectomies  . Av fistula placement Left 02/02/2015    Procedure: LEFT ARM RADIOCEPHALIC ARTERIOVENOUS (AV) FISTULA CREATION;  Surgeon: Conrad Shueyville, MD;  Location: [facility];  Service: Vascular;  Laterality: Left;  . Insertion of dialysis catheter N/A 03/09/2015    Procedure: INSERTION OF DIALYSIS CATHETER;  Surgeon: Conrad Cyril, MD;  Location: [facility];  Service: Vascular;  Laterality: N/A;  . Back surgery  02/13/14  . Colonoscopy     Family History  Family History  Problem Relation Age of Onset  . Adopted: Yes  . Prostate cancer Father   . Cancer Father     prostate  . Heart failure Maternal Grandmother   . Heart attack Maternal Grandfather   . Colon cancer Maternal Grandfather    Social History  reports that he quit smoking about 6 weeks ago.  His smoking use included Cigarettes. He started smoking about 41 years ago. He has a 20 pack-year smoking history. He has never used smokeless tobacco. He reports that he does not drink alcohol or use illicit drugs. Allergies No Known Allergies Home medications Prior to Admission medications   Medication Sig Start Date End Date Taking? Authorizing Provider  ALPRAZolam Duanne Moron) 1 MG tablet Take 1 mg by mouth daily as needed for anxiety.  12/21/14   Historical Provider, MD  aspirin EC 81 MG tablet Take 81 mg by mouth daily.    Historical Provider, MD  atorvastatin (LIPITOR) 40 MG tablet Take 40 mg by mouth at bedtime.    Historical Provider, MD  furosemide (LASIX) 40 MG tablet Take 80 mg by mouth 2 (two) times daily.     Historical Provider, MD  glipiZIDE (GLUCOTROL XL) 5 MG 24 hr tablet Take 1 tablet (5 mg total) by mouth daily before breakfast. 02/15/15    Thurnell Lose, MD  hydrALAZINE (APRESOLINE) 25 MG tablet Take 1 tablet (25 mg total) by mouth 3 (three) times daily. 03/16/15   Herminio Commons, MD  HYDROcodone-acetaminophen (NORCO) 10-325 MG per tablet Take 1 tablet by mouth 2 (two) times daily as needed for severe pain.  02/10/15   Historical Provider, MD  nitroGLYCERIN (NITROSTAT) 0.4 MG SL tablet Place 1 tablet (0.4 mg total) under the tongue every 5 (five) minutes as needed. 03/23/15   Herminio Commons, MD  POLY-IRON 150 150 MG capsule Take 150 mg by mouth 2 (two) times daily.  01/18/15   Historical Provider, MD  QC CALCIUM FAST DISSOLUTION 600 MG TABS tablet Take 600 mg by mouth 2 (two) times daily with a meal.  01/02/15   Historical Provider, MD  ranitidine (ZANTAC) 75 MG tablet Take 75 mg by mouth daily.    Historical Provider, MD  sodium bicarbonate 650 MG tablet Take 650 mg by mouth 3 (three) times daily.  01/09/15   Historical Provider, MD  traZODone (DESYREL) 50 MG tablet Take 50 mg by mouth at bedtime as needed for sleep.     Historical Provider, MD   Liver Function Tests No results for input(s): AST, ALT, ALKPHOS, BILITOT, PROT, ALBUMIN in the last 168 hours. No results for input(s): LIPASE, AMYLASE in the last 168 hours. CBC  Recent Labs Lab 03/24/15 1230  WBC 5.2  HGB 9.2*  HCT 28.5*  MCV 89.6  PLT [id number]*   Basic Metabolic Panel  Recent Labs Lab 03/24/15 1230  NA 136  K 4.2  CL 98*  CO2 28  GLUCOSE 131*  BUN 27*  CREATININE 4.43*  CALCIUM 8.6*    Filed Vitals:   03/24/15 1120  BP: 123/69  Pulse: 75  Temp: 97.6 F (36.4 C)  TempSrc: Oral  Resp: 18  Height: 6' (1.829 m)  Weight: 117.799 kg (259 lb 11.2 oz)  SpO2: 100%   Exam Alert, overweight, pleasant No jvd Chest clear bilat no rales RRR no MRG ABd obese soft ntnd no mass or ascites GU normal  L arm AVF no bruit, R IJ cath No LE edema Neuro is nf, O3  TTS [place] 4h  117 kg   3/2.5 bath  Hep 1000 bolus + 500/hr  R IJ cath  EPO 5800  tiw   Assessment: 1 MI / abnormal stress test - for cath tomorrow 2 ESRD recent HD start , TTS 3 DM 2 4 Vol is close to dry wt, no vol excess 5 HTN on hydral/  lasix 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 HD tomorrow after cath, no hep. DC bicarb po , decrease lasix to 80 daily. Will follow.   Kelly Splinter MD [employer] pager [phone]    cell [phone] 03/24/2015, 2:19 PM

## 2015-03-25 ENCOUNTER — Encounter (HOSPITAL_COMMUNITY)
Admission: AD | Disposition: A | Discharge status: Home / Self Care | Payer: Self-pay | Source: Ambulatory Visit | Attending: Cardiovascular Disease

## 2015-03-25 ENCOUNTER — Other Ambulatory Visit: Payer: Self-pay

## 2015-03-25 DIAGNOSIS — Z01810 Encounter for preprocedural cardiovascular examination: Secondary | ICD-10-CM | POA: Insufficient documentation

## 2015-03-25 DIAGNOSIS — D649 Anemia, unspecified: Secondary | ICD-10-CM | POA: Diagnosis present

## 2015-03-25 DIAGNOSIS — I132 Hypertensive heart and chronic kidney disease with heart failure and with stage 5 chronic kidney disease, or end stage renal disease: Secondary | ICD-10-CM | POA: Diagnosis present

## 2015-03-25 DIAGNOSIS — G4730 Sleep apnea, unspecified: Secondary | ICD-10-CM | POA: Diagnosis present

## 2015-03-25 DIAGNOSIS — G8929 Other chronic pain: Secondary | ICD-10-CM | POA: Diagnosis present

## 2015-03-25 DIAGNOSIS — I5022 Chronic systolic (congestive) heart failure: Secondary | ICD-10-CM | POA: Insufficient documentation

## 2015-03-25 DIAGNOSIS — Z992 Dependence on renal dialysis: Secondary | ICD-10-CM

## 2015-03-25 DIAGNOSIS — E785 Hyperlipidemia, unspecified: Secondary | ICD-10-CM | POA: Diagnosis present

## 2015-03-25 DIAGNOSIS — I429 Cardiomyopathy, unspecified: Secondary | ICD-10-CM | POA: Insufficient documentation

## 2015-03-25 DIAGNOSIS — E1122 Type 2 diabetes mellitus with diabetic chronic kidney disease: Secondary | ICD-10-CM | POA: Diagnosis present

## 2015-03-25 DIAGNOSIS — Z6835 Body mass index (BMI) 35.0-35.9, adult: Secondary | ICD-10-CM | POA: Diagnosis not present

## 2015-03-25 DIAGNOSIS — N186 End stage renal disease: Secondary | ICD-10-CM | POA: Insufficient documentation

## 2015-03-25 DIAGNOSIS — I2510 Atherosclerotic heart disease of native coronary artery without angina pectoris: Secondary | ICD-10-CM | POA: Diagnosis present

## 2015-03-25 DIAGNOSIS — M797 Fibromyalgia: Secondary | ICD-10-CM | POA: Diagnosis present

## 2015-03-25 DIAGNOSIS — R9439 Abnormal result of other cardiovascular function study: Secondary | ICD-10-CM | POA: Diagnosis present

## 2015-03-25 DIAGNOSIS — M545 Low back pain: Secondary | ICD-10-CM | POA: Diagnosis present

## 2015-03-25 DIAGNOSIS — Z87891 Personal history of nicotine dependence: Secondary | ICD-10-CM | POA: Diagnosis not present

## 2015-03-25 DIAGNOSIS — R001 Bradycardia, unspecified: Secondary | ICD-10-CM | POA: Diagnosis present

## 2015-03-25 HISTORY — PX: CARDIAC CATHETERIZATION: SHX172

## 2015-03-25 LAB — BASIC METABOLIC PANEL
Anion gap: 11 (ref 5–15)
BUN: 34 mg/dL — ABNORMAL HIGH (ref 6–20)
CALCIUM: 8.4 mg/dL — AB (ref 8.9–10.3)
CO2: 26 mmol/L (ref 22–32)
CREATININE: 4.52 mg/dL — AB (ref 0.61–1.24)
Chloride: 100 mmol/L — ABNORMAL LOW (ref 101–111)
GFR calc non Af Amer: 13 mL/min — ABNORMAL LOW (ref >60)
GFR, EST AFRICAN AMERICAN: 15 mL/min — AB (ref >60)
Glucose, Bld: 133 mg/dL — ABNORMAL HIGH (ref 65–99)
Potassium: 4.1 mmol/L (ref 3.5–5.1)
SODIUM: 137 mmol/L (ref 135–145)

## 2015-03-25 LAB — POCT ACTIVATED CLOTTING TIME
ACTIVATED CLOTTING TIME: 251 s
ACTIVATED CLOTTING TIME: 276 s
ACTIVATED CLOTTING TIME: 214 s
Activated Clotting Time: 214 seconds
Activated Clotting Time: 177 seconds

## 2015-03-25 LAB — MRSA PCR SCREENING: MRSA by PCR: NEGATIVE

## 2015-03-25 LAB — GLUCOSE, CAPILLARY
GLUCOSE-CAPILLARY: 119 mg/dL — AB (ref 65–99)
GLUCOSE-CAPILLARY: 116 mg/dL — AB (ref 65–99)
Glucose-Capillary: 187 mg/dL — ABNORMAL HIGH (ref 65–99)
Glucose-Capillary: 264 mg/dL — ABNORMAL HIGH (ref 65–99)

## 2015-03-25 SURGERY — LEFT HEART CATH AND CORONARY ANGIOGRAPHY
Anesthesia: LOCAL

## 2015-03-25 MED ORDER — HEPARIN (PORCINE) IN NACL 2-0.9 UNIT/ML-% IJ SOLN
INTRAMUSCULAR | Status: AC
  Filled 2015-03-25: qty 2000

## 2015-03-25 MED ORDER — SODIUM CHLORIDE 0.9 % IV BOLUS (SEPSIS)
250.0000 mL | Freq: Once | INTRAVENOUS | Status: AC
  Administered 2015-03-25: 250 mL via INTRAVENOUS

## 2015-03-25 MED ORDER — HYDROMORPHONE HCL 1 MG/ML IJ SOLN
INTRAMUSCULAR | Status: AC
  Filled 2015-03-25: qty 1

## 2015-03-25 MED ORDER — HEPARIN SODIUM (PORCINE) 1000 UNIT/ML IJ SOLN
INTRAMUSCULAR | Status: AC
  Filled 2015-03-25: qty 1

## 2015-03-25 MED ORDER — ALTEPLASE 2 MG IJ SOLR
2.0000 mg | Freq: Once | INTRAMUSCULAR | Status: DC | PRN
  Filled 2015-03-25: qty 2

## 2015-03-25 MED ORDER — HEPARIN SODIUM (PORCINE) 1000 UNIT/ML IJ SOLN
INTRAMUSCULAR | Status: DC | PRN
  Administered 2015-03-25: 2000 [IU] via INTRAVENOUS
  Administered 2015-03-25: 8000 [IU] via INTRAVENOUS
  Administered 2015-03-25: 3000 [IU] via INTRAVENOUS

## 2015-03-25 MED ORDER — CALCIUM CARBONATE 1250 (500 CA) MG PO TABS
1250.0000 mg | ORAL_TABLET | Freq: Two times a day (BID) | ORAL | Status: DC
  Administered 2015-03-25: 1250 mg via ORAL
  Filled 2015-03-25: qty 3

## 2015-03-25 MED ORDER — ONDANSETRON HCL 4 MG/2ML IJ SOLN: 4.0000 mg | Freq: Four times a day (QID) | INTRAMUSCULAR | Status: DC | PRN

## 2015-03-25 MED ORDER — TICAGRELOR 90 MG PO TABS
90.0000 mg | ORAL_TABLET | Freq: Two times a day (BID) | ORAL | Status: DC
  Administered 2015-03-25 – 2015-03-26 (×2): 90 mg via ORAL
  Filled 2015-03-25 (×2): qty 1

## 2015-03-25 MED ORDER — PENTAFLUOROPROP-TETRAFLUOROETH EX AERO: 1.0000 "application " | INHALATION_SPRAY | CUTANEOUS | Status: DC | PRN

## 2015-03-25 MED ORDER — ATROPINE SULFATE 0.1 MG/ML IJ SOLN
0.5000 mg | INTRAMUSCULAR | Status: DC | PRN
  Filled 2015-03-25: qty 10

## 2015-03-25 MED ORDER — VITAMIN D 1000 UNITS PO TABS
1000.0000 [IU] | ORAL_TABLET | Freq: Every day | ORAL | Status: DC
  Administered 2015-03-25 – 2015-03-26 (×2): 1000 [IU] via ORAL
  Filled 2015-03-25 (×4): qty 1

## 2015-03-25 MED ORDER — FENTANYL CITRATE (PF) 100 MCG/2ML IJ SOLN
INTRAMUSCULAR | Status: AC
  Filled 2015-03-25: qty 4

## 2015-03-25 MED ORDER — MIDAZOLAM HCL 2 MG/2ML IJ SOLN
2.0000 mg | Freq: Once | INTRAMUSCULAR | Status: AC
  Administered 2015-03-25: 2 mg via INTRAVENOUS

## 2015-03-25 MED ORDER — FENTANYL CITRATE (PF) 100 MCG/2ML IJ SOLN
INTRAMUSCULAR | Status: AC
  Filled 2015-03-25: qty 2

## 2015-03-25 MED ORDER — ASPIRIN 81 MG PO CHEW
CHEWABLE_TABLET | ORAL | Status: DC | PRN
  Administered 2015-03-25: 324 mg via ORAL

## 2015-03-25 MED ORDER — MORPHINE SULFATE (PF) 2 MG/ML IV SOLN
INTRAVENOUS | Status: AC
  Filled 2015-03-25: qty 1

## 2015-03-25 MED ORDER — LIDOCAINE-PRILOCAINE 2.5-2.5 % EX CREA
1.0000 "application " | TOPICAL_CREAM | CUTANEOUS | Status: DC | PRN
  Filled 2015-03-25: qty 5

## 2015-03-25 MED ORDER — SODIUM CHLORIDE 0.9 % IJ SOLN
3.0000 mL | Freq: Two times a day (BID) | INTRAMUSCULAR | Status: DC
Start: 2015-03-25 — End: 2015-03-26
  Administered 2015-03-25: 3 mL via INTRAVENOUS

## 2015-03-25 MED ORDER — SODIUM CHLORIDE 0.9 % IV SOLN: 100.0000 mL | INTRAVENOUS | Status: DC | PRN

## 2015-03-25 MED ORDER — DIAZEPAM 5 MG/ML IJ SOLN
2.0000 mg | Freq: Once | INTRAMUSCULAR | Status: AC
  Administered 2015-03-25: 2 mg via INTRAVENOUS
  Filled 2015-03-25: qty 2

## 2015-03-25 MED ORDER — HEPARIN SODIUM (PORCINE) 1000 UNIT/ML DIALYSIS: 1000.0000 [IU] | INTRAMUSCULAR | Status: DC | PRN

## 2015-03-25 MED ORDER — NITROGLYCERIN 1 MG/10 ML FOR IR/CATH LAB
INTRA_ARTERIAL | Status: DC | PRN
  Administered 2015-03-25: 11:00:00

## 2015-03-25 MED ORDER — HYDROMORPHONE HCL 1 MG/ML IJ SOLN
1.0000 mg | Freq: Once | INTRAMUSCULAR | Status: AC
  Administered 2015-03-25: 1 mg via INTRAVENOUS

## 2015-03-25 MED ORDER — LIDOCAINE HCL (PF) 1 % IJ SOLN: 5.0000 mL | INTRAMUSCULAR | Status: DC | PRN

## 2015-03-25 MED ORDER — SODIUM CHLORIDE 0.9 % IV BOLUS (SEPSIS): 250.0000 mL | Freq: Once | INTRAVENOUS | Status: DC

## 2015-03-25 MED ORDER — NITROGLYCERIN 1 MG/10 ML FOR IR/CATH LAB
INTRA_ARTERIAL | Status: AC
  Filled 2015-03-25: qty 10

## 2015-03-25 MED ORDER — TICAGRELOR 90 MG PO TABS
ORAL_TABLET | ORAL | Status: DC | PRN
  Administered 2015-03-25: 180 mg via ORAL

## 2015-03-25 MED ORDER — SODIUM CHLORIDE 0.9 % IJ SOLN: 3.0000 mL | INTRAMUSCULAR | Status: DC | PRN

## 2015-03-25 MED ORDER — SODIUM CHLORIDE 0.9 % IV SOLN: 250.0000 mL | INTRAVENOUS | Status: DC | PRN

## 2015-03-25 MED ORDER — TICAGRELOR 90 MG PO TABS
ORAL_TABLET | ORAL | Status: AC
  Filled 2015-03-25: qty 2

## 2015-03-25 MED ORDER — MIDAZOLAM HCL 2 MG/2ML IJ SOLN
INTRAMUSCULAR | Status: AC
  Filled 2015-03-25: qty 2

## 2015-03-25 MED ORDER — MORPHINE SULFATE (PF) 10 MG/ML IV SOLN
2.0000 mg | INTRAVENOUS | Status: DC | PRN
  Administered 2015-03-25: 2 mg via INTRAVENOUS

## 2015-03-25 MED ORDER — FENTANYL CITRATE (PF) 100 MCG/2ML IJ SOLN
INTRAMUSCULAR | Status: DC | PRN
  Administered 2015-03-25: 25 ug via INTRAVENOUS
  Administered 2015-03-25: 50 ug via INTRAVENOUS
  Administered 2015-03-25 (×2): 25 ug via INTRAVENOUS

## 2015-03-25 MED ORDER — IOHEXOL 350 MG/ML SOLN
INTRAVENOUS | Status: DC | PRN
  Administered 2015-03-25: 180 mL via INTRAVENOUS

## 2015-03-25 MED ORDER — HYDROMORPHONE HCL 1 MG/ML IJ SOLN
1.0000 mg | INTRAMUSCULAR | Status: DC | PRN
  Administered 2015-03-25: 1 mg via INTRAVENOUS

## 2015-03-25 MED ORDER — HYDRALAZINE HCL 20 MG/ML IJ SOLN
INTRAMUSCULAR | Status: AC
  Filled 2015-03-25: qty 1

## 2015-03-25 MED ORDER — HYDRALAZINE HCL 20 MG/ML IJ SOLN
INTRAMUSCULAR | Status: DC | PRN
  Administered 2015-03-25 (×2): 10 mg via INTRAVENOUS

## 2015-03-25 MED ORDER — ASPIRIN 81 MG PO CHEW
CHEWABLE_TABLET | ORAL | Status: AC
  Filled 2015-03-25: qty 4

## 2015-03-25 MED ORDER — MORPHINE SULFATE (PF) 2 MG/ML IV SOLN: 2.0000 mg | INTRAVENOUS | Status: DC | PRN

## 2015-03-25 MED ORDER — HYDROCODONE-ACETAMINOPHEN 10-325 MG PO TABS
1.0000 | ORAL_TABLET | Freq: Once | ORAL | Status: AC
  Administered 2015-03-25: 1 via ORAL
  Filled 2015-03-25: qty 1

## 2015-03-25 MED ORDER — FENTANYL CITRATE (PF) 100 MCG/2ML IJ SOLN
50.0000 ug | Freq: Once | INTRAMUSCULAR | Status: AC
  Administered 2015-03-25: 50 ug via INTRAVENOUS

## 2015-03-25 MED ORDER — MAGNESIUM OXIDE 400 (241.3 MG) MG PO TABS
400.0000 mg | ORAL_TABLET | Freq: Every day | ORAL | Status: DC
  Administered 2015-03-25 – 2015-03-26 (×2): 400 mg via ORAL
  Filled 2015-03-25 (×2): qty 1

## 2015-03-25 MED ORDER — MIDAZOLAM HCL 2 MG/2ML IJ SOLN
INTRAMUSCULAR | Status: AC
  Filled 2015-03-25: qty 4

## 2015-03-25 MED ORDER — SODIUM CHLORIDE 0.9 % IJ SOLN
3.0000 mL | Freq: Two times a day (BID) | INTRAMUSCULAR | Status: DC
  Administered 2015-03-25 (×2): 3 mL via INTRAVENOUS

## 2015-03-25 MED ORDER — NITROGLYCERIN 1 MG/10 ML FOR IR/CATH LAB
INTRA_ARTERIAL | Status: DC | PRN
  Administered 2015-03-25: 200 ug via INTRACORONARY

## 2015-03-25 MED ORDER — MIDAZOLAM HCL 2 MG/2ML IJ SOLN
INTRAMUSCULAR | Status: DC | PRN
  Administered 2015-03-25 (×2): 1 mg via INTRAVENOUS

## 2015-03-25 MED ORDER — ACETAMINOPHEN 325 MG PO TABS: 650.0000 mg | ORAL_TABLET | ORAL | Status: DC | PRN

## 2015-03-25 MED ORDER — ALPRAZOLAM 0.25 MG PO TABS
ORAL_TABLET | ORAL | Status: AC
  Filled 2015-03-25: qty 4

## 2015-03-25 MED ORDER — LIDOCAINE HCL (PF) 1 % IJ SOLN
INTRAMUSCULAR | Status: AC
  Filled 2015-03-25: qty 30

## 2015-03-25 SURGICAL SUPPLY — 17 items
BALLN EMERGE MR 2.5X12 (BALLOONS) ×2
BALLN ~~LOC~~ EMERGE MR 4.0X12 (BALLOONS) ×2
BALLOON EMERGE MR 2.5X12 (BALLOONS) IMPLANT
BALLOON ~~LOC~~ EMERGE MR 4.0X12 (BALLOONS) IMPLANT
CATH INFINITI 5FR MULTPACK ANG (CATHETERS) ×1 IMPLANT
CATH VISTA GUIDE 6FR XBLAD3.5 (CATHETERS) ×1 IMPLANT
KIT ENCORE 26 ADVANTAGE (KITS) ×1 IMPLANT
KIT HEART LEFT (KITS) ×2 IMPLANT
PACK CARDIAC CATHETERIZATION (CUSTOM PROCEDURE TRAY) ×2 IMPLANT
SHEATH PINNACLE 5F 10CM (SHEATH) ×1 IMPLANT
SHEATH PINNACLE 6F 10CM (SHEATH) ×1 IMPLANT
STENT PROMUS PREM MR 3.5X16 (Permanent Stent) ×1 IMPLANT
SYR MEDRAD MARK V 150ML (SYRINGE) ×2 IMPLANT
TRANSDUCER W/STOPCOCK (MISCELLANEOUS) ×2 IMPLANT
TUBING CIL FLEX 10 FLL-RA (TUBING) ×2 IMPLANT
WIRE EMERALD 3MM-J .035X150CM (WIRE) ×1 IMPLANT
WIRE HI TORQ BMW 190CM (WIRE) ×1 IMPLANT

## 2015-03-25 NOTE — Progress Notes (Signed)
Pt not medically ready for therapy.  Pt going for cardiac cath and then onto dialysis today.  Will check back 10/21. Jinger Neighbors, [place] [phone]

## 2015-03-25 NOTE — Progress Notes (Signed)
Valium 8mg  for IV wasted in sink and witnessed by Alison Murray.

## 2015-03-25 NOTE — Progress Notes (Signed)
  [facility] Progress Note   Subjective: had cath today with 95% LAD lesion stented, also has 100% RCA w collaterals that was left alone. LVEF 35-40%. Having bradycardia into 30-40's post procedure, had this during procedure as well.  No SOB.  Feels like he is having "panic attacks" when he closes his eyes.   Filed Vitals:   03/25/15 1325 03/25/15 1340 03/25/15 1355 03/25/15 1425  BP: 142/81 153/61 145/71 113/50  Pulse: 52 45 64 44  Temp:      TempSrc:      Resp: 11 21 19 17   Height:      Weight:      SpO2: 100% 100% 100% 100%   Exam: Alert, no distress, anxious No jvd Chest clear bilat no rales RRR no MRG ABd obese soft ntnd no mass or ascites GU normal  L arm AVF no bruit, R IJ cath No LE edema Neuro is nf, O3  TTS [place] 4h 117 kg 3/2.5 bath Hep 1000 bolus + 500/hr R IJ cath  EPO 5800 tiw   Assessment: 1 MI / CAD - had PCI to LAD this morning. RCA 100% was left alone, LVEDP was 17.  Post procedure is bradycardic and doesn't feel good. BP's 110's. Spoke w Dr Ellyn Hack and he got a lot of narcotics/ anxiolytics in the cath lab for severe back pain issues, prob what 's causing his bradycardia.     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     Kelly Splinter MD [facility] pager [phone]    cell [phone] 03/25/2015, 3:04 PM    Recent Labs Lab 03/24/15 1230 03/25/15 0230  NA 136 137  K 4.2 4.1  CL 98* 100*  CO2 28 26  GLUCOSE 131* 133*  BUN 27* 34*  CREATININE 4.43* 4.52*  CALCIUM 8.6* 8.4*   No results for input(s): AST, ALT, ALKPHOS, BILITOT, PROT, ALBUMIN in the last 168 hours.  Recent Labs Lab 03/24/15 1230  WBC 5.2  HGB 9.2*  HCT 28.5*  MCV 89.6  PLT 135*   . aspirin EC  81 mg Oral Daily  . atorvastatin  40 mg Oral QHS  . calcium carbonate  1,250 mg Oral BID WC  .  cholecalciferol  1,000 Units Oral Daily  . furosemide  80 mg Oral Daily  . glipiZIDE  5 mg Oral QAC breakfast  . hydrALAZINE  25 mg Oral TID  . insulin aspart  0-15 Units Subcutaneous TID WC  . iron polysaccharides  150 mg Oral BID  . magnesium oxide  400 mg Oral Daily  . sodium chloride  3 mL Intravenous Q12H  . sodium chloride  3 mL Intravenous Q12H  . sodium chloride  3 mL Intravenous Q12H  . ticagrelor  90 mg Oral BID     sodium chloride, sodium chloride, sodium chloride, sodium chloride, acetaminophen, ALPRAZolam, alteplase, heparin, lidocaine (PF), lidocaine-prilocaine, morphine injection, nitroGLYCERIN, ondansetron (ZOFRAN) IV, pentafluoroprop-tetrafluoroeth, sodium chloride, sodium chloride, traZODone

## 2015-03-25 NOTE — H&P (View-Only) (Signed)
Patient ID: Mark [name], male   DOB: 01/21/1956, 59 y.o.   MRN: [id number]      SUBJECTIVE: The patient returns for follow-up after undergoing cardiovascular testing performed for the evaluation of CHF.  Nuclear stress testing on 03/22/15 demonstrated a high risk study with significant ST segment depression and evidence for prior myocardial infarction with peri-infarct ischemia. The infarct involved the inferior wall from the apex to the base with a small area of septal and apical ischemia, all suggestive of multivessel disease.  He was initially scheduled to undergo a left arm brachiocephalic fistula tomorrow, but due to the high risk stress test, it has been cancelled.  Denies chest pain and shortness of breath. Has had back pain, worse with exertion, primarily between the shoulder blades. Does have chronic back issues as well.   Review of Systems: As per "subjective", otherwise negative.  No Known Allergies  Current Outpatient Prescriptions  Medication Sig Dispense Refill  . ALPRAZolam (XANAX) 1 MG tablet Take 1 mg by mouth daily as needed for anxiety.     Marland Kitchen atorvastatin (LIPITOR) 40 MG tablet Take 40 mg by mouth at bedtime.    . furosemide (LASIX) 40 MG tablet Take 80 mg by mouth 2 (two) times daily.     Marland Kitchen glipiZIDE (GLUCOTROL XL) 5 MG 24 hr tablet Take 1 tablet (5 mg total) by mouth daily before breakfast. 30 tablet 0  . hydrALAZINE (APRESOLINE) 25 MG tablet Take 1 tablet (25 mg total) by mouth 3 (three) times daily. 90 tablet 6  . HYDROcodone-acetaminophen (NORCO) 10-325 MG per tablet Take 1 tablet by mouth 2 (two) times daily as needed for severe pain.   0  . POLY-IRON 150 150 MG capsule Take 150 mg by mouth 2 (two) times daily.     . QC CALCIUM FAST DISSOLUTION 600 MG TABS tablet Take 600 mg by mouth 2 (two) times daily with a meal.   4  . ranitidine (ZANTAC) 75 MG tablet Take 75 mg by mouth daily.    . sodium bicarbonate 650 MG tablet Take 650 mg by mouth 3 (three) times  daily.   4  . traZODone (DESYREL) 50 MG tablet Take 50 mg by mouth at bedtime as needed for sleep.      No current facility-administered medications for this visit.    Past Medical History  Diagnosis Date  . Back pain   . Hypertension   . Hypercholesteremia   . Anxiety     occas. panic attack, takes xanax occas  . Hematuria     being followed by Dr. Hinda Lenis for decreased kidney function   . Chronic kidney disease   . GERD (gastroesophageal reflux disease)     otc- pepcid , approx. every other  day    . Arthritis     herniated disc, lumbar   . Cancer ([place])     - skin ca on face- removed   . Sleep apnea     test aborted, due to not able to relax , since the aborted test he had surgery for gallbladder & he reports that he was told that he has sleep apnea   . Pneumonia     "walking"  . Diabetes mellitus without complication ([place])     type 2  . H/O acute respiratory failure   . History of blood product transfusion 02/13/2014    After having back surgery  . CHF (congestive heart failure) ([place])   . Shortness of breath dyspnea   .  Complication of anesthesia     " it takes a long time to get over it."   . Fibromyalgia     Past Surgical History  Procedure Laterality Date  . Cholecystectomy    . Tonsillectomy    . Eye surgery      cataracts remove, bilateral, w/IOL.  . Lumbar laminectomy/decompression microdiscectomy Right 02/13/2014    Procedure: RIGHT LUMBAR FOUR-FIVE, RIGHT LUMBAR FIVE- SACRAL ONE LUMBAR LAMINECTOMY/DECOMPRESSION MICRODISCECTOMY ;  Surgeon: Ashok Pall, MD;  Location: [facility];  Service: Neurosurgery;  Laterality: Right;  Right L4-5 and Right L5-S1 diskectomies  . Av fistula placement Left 02/02/2015    Procedure: LEFT ARM RADIOCEPHALIC ARTERIOVENOUS (AV) FISTULA CREATION;  Surgeon: Conrad Hesperia, MD;  Location: [facility];  Service: Vascular;  Laterality: Left;  . Insertion of dialysis catheter N/A 03/09/2015    Procedure: INSERTION OF DIALYSIS CATHETER;   Surgeon: Conrad Vienna, MD;  Location: [facility];  Service: Vascular;  Laterality: N/A;  . Back surgery  02/13/14  . Colonoscopy      Social History   Social History  . Marital Status: Married    Spouse Name: N/A  . Number of Children: N/A  . Years of Education: N/A   Occupational History  . Not on file.   Social History Main Topics  . Smoking status: Former Smoker -- 0.50 packs/day for 40 years    Types: Cigarettes    Start date: 10/17/1973    Quit date: 02/07/2015  . Smokeless tobacco: Never Used  . Alcohol Use: No  . Drug Use: No  . Sexual Activity: Not on file   Other Topics Concern  . Not on file   Social History Narrative     Filed Vitals:   03/23/15 1519  BP: 128/74  Pulse: 90  Height: 6' (1.829 m)  Weight: 259 lb 9.6 oz (117.754 kg)  SpO2: 98%    PHYSICAL EXAM General: NAD HEENT: Normal. Neck: No JVD, no thyromegaly. Lungs: Clear to auscultation bilaterally with normal respiratory effort. CV: Regular rate and rhythm, normal S1/S2, no S3/S4, no murmur. No pretibial or periankle edema.  Abdomen: Soft, obese, no distention.  Neurologic: Alert and oriented x 3.  Psych: Normal affect. Skin: Normal. Musculoskeletal: Normal range of motion, no gross deformities. Extremities: No clubbing or cyanosis.   ECG: Most recent ECG reviewed.      ASSESSMENT AND PLAN: 1. Chronic systolic heart failure/cardiomyopathy, EF 35-40%: LVEF had been normal in 02/2014. Given high risk stress test findings documented above, will proceed with coronary angiography. Did not tolerate long-acting nitrates. Unable to use beta blocker due to long history of bradycardia and unable to use ACE inhibitors or ARB's due to end-stage renal disease.  I started hydralazine 25 mg three times daily at his last visit for afterload reduction and given his elevated blood pressure. Continue Lasix 40 mg twice daily.  2. Hyperlipidemia: Lipids from 02/2015 reviewed above. Continue Lipitor 40 mg  daily.  3. Essential HTN: Normalized after initiation of hydralazine 25 mg three times daily for afterload reduction. He had been on amlodipine but this was stopped.  4. Bradycardia: HR currently normal.  5. ESRD/pre-op risk stratification: On hemodialysis. Surgery would be high risk given the multiple abnormalities seen on stress testing. Without knowing coronary anatomy, I would not recommend proceeding with brachiocephalic fistula surgery until coronary angiography is performed.  6. 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 Already on statin therapy. Unable to use beta blockers at present  due to long history of bradycardia. Will prescribe SL nitroglycerin.  Dispo: f/u after cath.  [name], M.D., F.A.C.C.

## 2015-03-25 NOTE — Progress Notes (Signed)
Pt states when he falls asleep he wakes up feeling like he cannot breathe. He then starts to breathe heavy to catch his breath and then has 2-3 aching chest pain. Notified Cardiology PA, no new orders at this time. Will continue to monitor closely.

## 2015-03-25 NOTE — Progress Notes (Addendum)
Patient continues to c/o back pain, pain going all the way up his back now. Restless. Hitting siderail. Dr. Ellyn Hack spoken to. Denies chest discomfort. Repeat 12-lead EKG done--no change. Placed on O2 2L [place].

## 2015-03-25 NOTE — Progress Notes (Signed)
HR dropping to mid 30's. Dr. Ellyn Hack notified.

## 2015-03-25 NOTE — Progress Notes (Signed)
PT Cancellation Note  Patient Details Name: Mark [name] MRN: [id number] DOB: May 26, 1956   Cancelled Treatment:    Reason Eval/Treat Not Completed: Patient not medically ready Pt for cardiac cath today and then HD. Will follow up next available time to perform PT evaluation.   Marguarite Arbour A Terena Bohan 03/25/2015, 7:35 AM Wray Kearns, PT, DPT [phone]

## 2015-03-25 NOTE — Progress Notes (Signed)
Site area: rt groin fa sheath Site Prior to Removal:  Level  0 Pressure Applied For:  25 minutes Manual:   yes Patient Status During Pull:  stable Post Pull Site:  Level  0 Post Pull Instructions Given:  yes Post Pull Pulses Present: yes Dressing Applied:  tegaderm Bedrest begins @  [id number] Comments:

## 2015-03-25 NOTE — Progress Notes (Signed)
Patient having 10/10 back pain. Crying. Bending right leg. Breathing fast. Anxious. Dr. Ellyn Hack paged.

## 2015-03-25 NOTE — Interval H&P Note (Signed)
History and Physical Interval Note:  03/25/2015 9:46 AM  Mark [name]  has presented today for surgery, with the diagnosis of high risk stress test.  The various methods of treatment have been discussed with the patient and family. After consideration of risks, benefits and other options for treatment, the patient has consented to  Procedure(s): Left Heart Cath and Coronary Angiography (N/A) as a surgical intervention .  The patient's history has been reviewed, patient examined, no change in status, stable for surgery.  I have reviewed the patient's chart and labs.  Questions were answered to the patient's satisfaction.    Cath Lab Visit (complete for each Cath Lab visit)  Clinical Evaluation Leading to the Procedure:   ACS: No.  Non-ACS:    Anginal Classification: CCS III  Anti-ischemic medical therapy: Minimal Therapy (1 class of medications)  Non-Invasive Test Results: High-risk stress test findings: cardiac mortality >3%/year  Prior CABG: No previous CABG  HARDING, DAVID W  Ischemic Symptoms? CCS III (Marked limitation of ordinary activity) Anti-ischemic Medical Therapy? Minimal Therapy (1 class of medications) Non-invasive Test Results? High-risk stress test findings: cardiac mortality >3%/yr Prior CABG? No Previous CABG   Patient Information:   1-2V CAD, no prox LAD  A (8)  Indication: 18; Score: 8   Patient Information:   CTO of 1 vessel, no other CAD  A (7)  Indication: 28; Score: 7   Patient Information:   1V CAD with prox LAD  A (9)  Indication: 34; Score: 9   Patient Information:   2V-CAD with prox LAD  A (9)  Indication: 40; Score: 9   Patient Information:   3V-CAD without LMCA  A (9)  Indication: 46; Score: 9   Patient Information:   3V-CAD without LMCA With Abnormal LV systolic function  A (9)  Indication: 48; Score: 9   Patient Information:   LMCA-CAD  A (9)  Indication: 49; Score: 9   Patient Information:   2V-CAD  with prox LAD PCI  A (7)  Indication: 62; Score: 7   Patient Information:   2V-CAD with prox LAD CABG  A (8)  Indication: 62; Score: 8   Patient Information:   3V-CAD without LMCA With Low CAD burden(i.e., 3 focal stenoses, low SYNTAX score) PCI  A (7)  Indication: 63; Score: 7   Patient Information:   3V-CAD without LMCA With Low CAD burden(i.e., 3 focal stenoses, low SYNTAX score) CABG  A (9)  Indication: 63; Score: 9   Patient Information:   3V-CAD without LMCA E06c - Intermediate-high CAD burden (i.e., multiple diffuse lesions, presence of CTO, or high SYNTAX score) PCI  U (4)  Indication: 64; Score: 4   Patient Information:   3V-CAD without LMCA E06c - Intermediate-high CAD burden (i.e., multiple diffuse lesions, presence of CTO, or high SYNTAX score) CABG  A (9)  Indication: 64; Score: 9   Patient Information:   LMCA-CAD With Isolated LMCA stenosis  PCI  U (6)  Indication: 65; Score: 6   Patient Information:   LMCA-CAD With Isolated LMCA stenosis  CABG  A (9)  Indication: 65; Score: 9   Patient Information:   LMCA-CAD Additional CAD, low CAD burden (i.e., 1- to 2-vessel additional involvement, low SYNTAX score) PCI  U (5)  Indication: 66; Score: 5   Patient Information:   LMCA-CAD Additional CAD, low CAD burden (i.e., 1- to 2-vessel additional involvement, low SYNTAX score) CABG  A (9)  Indication: 66; Score: 9   Patient Information:  LMCA-CAD Additional CAD, intermediate-high CAD burden (i.e., 3-vessel involvement, presence of CTO, or high SYNTAX score) PCI  I (3)  Indication: 67; Score: 3   Patient Information:   LMCA-CAD Additional CAD, intermediate-high CAD burden (i.e., 3-vessel involvement, presence of CTO, or high SYNTAX score) CABG  A (9)  Indication: 67; Score: 9   HARDING, DAVID W, M.D., M.S. Interventional Cardiologist   Pager # [phone]

## 2015-03-25 NOTE — Progress Notes (Signed)
Nutrition Brief Note  Patient identified on the Malnutrition Screening Tool (MST) Report  Wt Readings from Last 15 Encounters:  03/25/15 261 lb (118.389 kg)  03/23/15 259 lb 9.6 oz (117.754 kg)  03/16/15 258 lb (117.028 kg)  03/09/15 253 lb (114.76 kg)  02/15/15 271 lb 12.8 oz (123.288 kg)  02/02/15 289 lb (131.09 kg)  01/22/15 289 lb 11.2 oz (131.407 kg)  04/13/14 287 lb (130.182 kg)  02/13/14 300 lb 14.9 oz (136.5 kg)  02/12/14 291 lb (131.997 kg)  02/06/14 285 lb 3 oz (129.36 kg)    Body mass index is 35.39 kg/(m^2). Patient meets criteria for Obesity Class II based on current BMI.   Current diet order is Renal/Carbohydrate Modified, patient is consuming approximately 100% of meals at this time. Labs and medications reviewed.   No nutrition interventions warranted at this time. If nutrition issues arise, please consult RD.   Arthur Holms, RD, LDN Pager #: [phone] After-Hours Pager #: [phone]

## 2015-03-25 NOTE — Progress Notes (Signed)
Report called to Caroline,RN on 2900. And report given to [facility] in Cath Lab holding. Rt groin remains WNL.

## 2015-03-25 NOTE — Progress Notes (Signed)
Patient is for cardiac catheterization this morning.  Mark November, MD

## 2015-03-25 NOTE — Progress Notes (Signed)
Settled down some. States back pain is from hips all the way up his back.

## 2015-03-25 NOTE — Progress Notes (Signed)
Report received from [place]. Patient resting in bed. Patient still complains of 9/10 back pain but states to be feeling a little better than before. Rt groin soft with no active bleeding or hematoma noted. VSS. Rt DP 2+. Will continue to monitor patient

## 2015-03-25 NOTE — Progress Notes (Signed)
Paged by nursing staff for bradycardia and occasional apnea.   Mr. Mark [name] is a 59 yo morbidly obese male with PMH of bradycardia, ESRD newly started on HD and LV dysfunction with EF 35-40% since early Oct 2016 came in for cath today which showed 95% LAD lesion and 100% RCA with collaterals. Following by nephrology. During and post cath, pt had bradycardia with HR 39 to 40s upto 60s. Nurse also noted short apnea episodes.   Per pt, he was suppose to have a sleep study before however he never completed it as he left after 2 hrs due inability to fall asleep. He is morbidly obese, likely has underlying OSA. Will need outpatient sleep study and possibly CPAP. He also noted to have significant back pain and received lots of sedation and narcotic during cath. Combination of narcotic and underlying OSA likely caused severe bradycardia and apnea. Instructed nurse to limit amount narcotic and benzo he receive. He states he would not need much narcotic if he can lay on his side. It is fine for him to lay on the left side later off bed rest (cath was from R femoral approach). He is off bedrest at 6:45pm. Exam did not reveal significant sign of HF.  I also instructed nurse to keep atropine at bedside if bradycardia become symptomatic. Will let overnight coverage PA know to keep an eye on him.  Hilbert Corrigan PA Pager: [phone]

## 2015-03-26 ENCOUNTER — Encounter: Payer: Managed Care, Other (non HMO) | Admitting: Vascular Surgery

## 2015-03-26 ENCOUNTER — Encounter (HOSPITAL_COMMUNITY): Payer: Self-pay | Admitting: Cardiology

## 2015-03-26 ENCOUNTER — Other Ambulatory Visit: Payer: Self-pay

## 2015-03-26 ENCOUNTER — Telehealth: Payer: Self-pay

## 2015-03-26 LAB — HEMOGLOBIN AND HEMATOCRIT, BLOOD
HEMATOCRIT: 23.3 % — AB (ref 39.0–52.0)
HEMOGLOBIN: 8.1 g/dL — AB (ref 13.0–17.0)

## 2015-03-26 LAB — CBC
HCT: 22.7 % — ABNORMAL LOW (ref 39.0–52.0)
HEMOGLOBIN: 7.6 g/dL — AB (ref 13.0–17.0)
MCH: 29.1 pg (ref 26.0–34.0)
MCHC: 33.5 g/dL (ref 30.0–36.0)
MCV: 87 fL (ref 78.0–100.0)
PLATELETS: 146 10*3/uL — AB (ref 150–400)
RBC: 2.61 MIL/uL — AB (ref 4.22–5.81)
RDW: 14.3 % (ref 11.5–15.5)
WBC: 6 10*3/uL (ref 4.0–10.5)

## 2015-03-26 LAB — BASIC METABOLIC PANEL
ANION GAP: 11 (ref 5–15)
BUN: 42 mg/dL — ABNORMAL HIGH (ref 6–20)
CHLORIDE: 100 mmol/L — AB (ref 101–111)
CO2: 25 mmol/L (ref 22–32)
CREATININE: 5.09 mg/dL — AB (ref 0.61–1.24)
Calcium: 8.6 mg/dL — ABNORMAL LOW (ref 8.9–10.3)
GFR calc non Af Amer: 11 mL/min — ABNORMAL LOW (ref >60)
GFR, EST AFRICAN AMERICAN: 13 mL/min — AB (ref >60)
Glucose, Bld: 121 mg/dL — ABNORMAL HIGH (ref 65–99)
POTASSIUM: 4.4 mmol/L (ref 3.5–5.1)
SODIUM: 136 mmol/L (ref 135–145)

## 2015-03-26 LAB — GLUCOSE, CAPILLARY: Glucose-Capillary: 207 mg/dL — ABNORMAL HIGH (ref 65–99)

## 2015-03-26 LAB — HEPATITIS B SURFACE ANTIGEN: HEP B S AG: NEGATIVE

## 2015-03-26 MED ORDER — TICAGRELOR 90 MG PO TABS: 90.0000 mg | ORAL_TABLET | Freq: Two times a day (BID) | ORAL | Status: DC

## 2015-03-26 MED ORDER — ALPRAZOLAM 0.5 MG PO TABS
1.0000 mg | ORAL_TABLET | Freq: Three times a day (TID) | ORAL | Status: DC | PRN
  Administered 2015-03-26: 1 mg via ORAL

## 2015-03-26 MED ORDER — ALPRAZOLAM 0.5 MG PO TABS
ORAL_TABLET | ORAL | Status: AC
  Administered 2015-03-26: 1 mg via ORAL
  Filled 2015-03-26: qty 2

## 2015-03-26 NOTE — Discharge Summary (Signed)
Discharge Summary   Patient ID: Mark Mark,  MRN: [id number], DOB/AGE: 06/26/1955 59 y.o.  Admit date: 03/24/2015 Discharge date: 03/26/2015  Primary Care Provider: TAPPER,Mark B Primary Cardiologist: Dr. Bronson [name]   Discharge Diagnoses Principal Problem:   Positive cardiac stress test Active Problems:   Abnormal nuclear stress test   Cardiomyopathy Mark Mark)   Chronic systolic heart failure (HCC)   ESRD (end stage renal disease) Mark Mark)   Pre-operative cardiovascular examination   Abnormal stress test   Allergies No Known Allergies  Procedures  Cardiac catheterization 03/25/2015 Conclusion    1. Prox LAD to Mid LAD lesion, 95% stenosed. A Promus Premier DES 3.5 mm x 16 mm stent postdilated to 4.1 mm was placed.There is a 0% residual stenosis. 2. Prox RCA to Dist RCA lesion, 100% stenosed. 3. There is moderate to severe left ventricular systolic dysfunction. As expected there is inferior hypokinesis as well as anterior hypokinesis  Severe 2 vessel disease with 100% occluded CT of the RCA filled in the PDA region via collaterals from the LAD and PL via the Circumflex. Likely culprit lesion for recent event is the 95% ostial LAD lesion now treated successfully with a single DES stent.  Recommendations:  The patient had significant back pain upon arrival to the PACU holding area. He will be medicated with analgesics including morphine and fentanyl as well as Valium to allow for sheath removal.  He will remain overnight for observation.  He should be seen by Dr. Bridgett Mark prior to discharge in order to arrange timing of fistula procedure.  Currently Mark Brilinta, but would be okay to transition to Plavix if this would be a better option for surgery. Long-term Brilinta would be better.  Okay to stop aspirin after 3 months  Continue aggressive risk factor modification with blood pressure control.  He will follow up with Dr. Bronson [name].       Mark  Course  The patient is a 59 year old morbidly obese male with past medical history of bradycardia, hypertension, hyperlipidemia, DM2, ESRD newly started Mark HD in early Oct 2016 via R tunneled catheter, and LV dysfunction with EF 35-40%. His left upper extremity AV fistula has clotted off, per Dr. Aaron Edelman [name]'s last Mark note, plan for AV fistula placement Mark 10/21. However nuclear stress test obtained Mark 03/22/2015 demonstrated high risks study with significant ST segment depression and evidence of prior MI with peri-infarct ischemia. The infarct evolved inferior wall from the apex to the base with small area of septal and apical ischemia, all suggestive of multivessel disease. After discussing with the patient, he agreed to undergo cardiac catheterization. His vascular surgery was cancelled.  He presented for the planned procedure Mark 03/25/2015 which showed 95% prox to mid LAD treated with Promus Premier DES 3.5x50mm postdilated to 4.45mm, 100% prox to distal RCA lesion filled in the PDA region via collaterals from LAD and PLA. Postprocedure, he was placed Mark aspirin and Brilinta. However according to Dr. Ellyn [name], it is okay to switch to Plavix if this is a better option for vascular surgery, although long-term-wise, Brilinta would be better. Okay to stop aspirin after 3 month. As patient received a higher dose than normal sedation during cardiac catheterization due to chronic back pain, he had significant bradycardia down to as low as 39 after procedure. This was felt likely due to combination of his morbid obesity, sedation and possibly underlying OSA. Further narcotic was limited, and his HR improved overnight. Nephrology was consulted and he underwent hemodialysis in the morning  of 03/26/2015. He was seen in the morning of 10/21, at which time he was doing okay from cardiac perspective. His HR has improved to 60-70s without further bradycardia. His only complaint was lower back pain which has been chronic  for him. His hemoglobin has dropped down to 7.6 from 9.2, hemoglobin was repeated, Mark repeat was 8.1. Suspicion for retroperitoneal bleed was low. I have contacted Dr. Lianne Mark Mark, per Dr. Vallarie [name], would like to hold off Mark AV fistula surgery for at least one month after new stent placement. His Mark will contact the patient to arrange follow-up. I have discussed with the patient the importance of compliance with dual antiplatelets therapy. I have discussed with M.D. regarding acute Mark chronic anemia, he has been cleared for discharge from cardiology perspective. I have arranged for him to obtain repeat CBC at [facility] in 1 week and moved up his previous follow-up with Dr Mark Mark from December to early November to allow him to have earlier cardiology follow-up.   Discharge Vitals Blood pressure 109/66, pulse 88, temperature 97.3 F (36.3 C), temperature source Oral, resp. rate 28, height 6' (1.829 m), weight 263 lb 14.3 oz (119.7 kg), SpO2 100 %.  Filed Weights   03/24/15 1120 03/25/15 0512 03/26/15 0703  Weight: 259 lb 11.2 oz (117.799 kg) 261 lb (118.389 kg) 263 lb 14.3 oz (119.7 kg)    Labs  CBC  Recent Labs  03/24/15 1230 03/26/15 0223 03/26/15 1024  WBC 5.2 6.0  --   HGB 9.2* 7.6* 8.1*  HCT 28.5* 22.7* 23.3*  MCV 89.6 87.0  --   PLT 135* 146*  --    Basic Metabolic Panel  Recent Labs  03/25/15 0230 03/26/15 0223  NA 137 136  K 4.1 4.4  CL 100* 100*  CO2 26 25  GLUCOSE 133* 121*  BUN 34* 42*  CREATININE 4.52* 5.09*  CALCIUM 8.4* 8.6*    Disposition  Pt is being discharged home today in good condition.  Follow-up Plans & Appointments      Follow-up Information    Follow up with Mark Commons, MD Mark 04/12/2015.   Specialty:  Cardiology   Why:  2:40pm. Cardiology followup   Contact information:   [place] [address] [phone]       Follow up with Mark Barthel, MD.   Specialties:  Vascular Surgery, Cardiology    Why:  Mark will contact you to arrange followup   Contact information:   [place] [address] [phone]       Follow up with Mark Mark 04/02/2015.   Specialty:  Cardiology   Why:  Obtain CBC in 1 week at [facility] information:   [place] [phone]      Discharge Medications    Medication List    TAKE these medications        ALPRAZolam 1 MG tablet  Commonly known as:  XANAX  Take 1 mg by mouth daily as needed for anxiety.     aspirin EC 81 MG tablet  Take 81 mg by mouth daily.     atorvastatin 40 MG tablet  Commonly known as:  LIPITOR  Take 40 mg by mouth at bedtime.     cholecalciferol 1000 UNITS tablet  Commonly known as:  VITAMIN D  Take 1,000 Units by mouth daily.     furosemide  40 MG tablet  Commonly known as:  LASIX  Take 80 mg by mouth 2 (two) times daily.     glipiZIDE 5 MG 24 hr tablet  Commonly known as:  GLUCOTROL XL  Take 1 tablet (5 mg total) by mouth daily before breakfast.     hydrALAZINE 25 MG tablet  Commonly known as:  APRESOLINE  Take 1 tablet (25 mg total) by mouth 3 (three) times daily.     HYDROcodone-acetaminophen 10-325 MG tablet  Commonly known as:  NORCO  Take 1 tablet by mouth 2 (two) times daily as needed for severe pain.     magnesium oxide 400 MG tablet  Commonly known as:  MAG-OX  Take 400 mg by mouth daily.     multivitamin with minerals tablet  Take 1 tablet by mouth daily.     nitroGLYCERIN 0.4 MG SL tablet  Commonly known as:  NITROSTAT  Place 1 tablet (0.4 mg total) under the tongue every 5 (five) minutes as needed.     POLY-IRON 150 150 MG capsule  Generic drug:  iron polysaccharides  Take 150 mg by mouth 2 (two) times daily.     QC CALCIUM FAST DISSOLUTION 600 MG Tabs tablet  Generic drug:  calcium carbonate  Take 600 mg by mouth 2 (two) times daily with a meal.     ranitidine 75 MG  tablet  Commonly known as:  ZANTAC  Take 75 mg by mouth daily.     sodium bicarbonate 650 MG tablet  Take 650 mg by mouth 3 (three) times daily.     ticagrelor 90 MG Tabs tablet  Commonly known as:  BRILINTA  Take 1 tablet (90 mg total) by mouth 2 (two) times daily.     traZODone 50 MG tablet  Commonly known as:  DESYREL  Take 50 mg by mouth at bedtime as needed for sleep.        Outstanding Labs/Studies  Obtain CBC at [facility] in 1 week and have result sent to Dr. Court Joy Mark  Duration of Discharge Encounter   Greater than 30 minutes including physician time.  Mark Corrigan PA-C Pager: [phone] 03/26/2015, 3:41 PM Patient seen and examined. I agree with the assessment and plan as detailed above. See also my additional thoughts below.   See progress note also. I made decision for discharge. Dola Argyle, MD, [facility] 03/27/2015 10:54 AM

## 2015-03-26 NOTE — Evaluation (Signed)
Physical Therapy Evaluation Patient Details Name: Mark [name] MRN: [id number] DOB: 10/26/1955 Today's Date: 03/26/2015   History of Present Illness  59 yo morbidly obese male with PMH of bradycardia, ESRD newly started on HD and LV dysfunction with EF 35-40% since early Oct 2016 presented to [facility] for cardiac cath.   Clinical Impression  Patient seen for mobility evaluation and assessment given history of falls and low back/leg pain. Patient mobilizing well, no pain at this time. No need for assist, Independent with all aspects of mobility. No further acute PT needs at this time. Will sign off.    Follow Up Recommendations No PT follow up    Equipment Recommendations  None recommended by PT    Recommendations for Other Services       Precautions / Restrictions Restrictions Weight Bearing Restrictions: No      Mobility  Bed Mobility Overal bed mobility: Independent                Transfers Overall transfer level: Independent Equipment used: None                Ambulation/Gait Ambulation/Gait assistance: Independent Ambulation Distance (Feet): 360 Feet Assistive device: None Gait Pattern/deviations: WFL(Within Functional Limits)     General Gait Details: steady with ambulation despite history of LE and back pain  Stairs            Wheelchair Mobility    Modified Rankin (Stroke Patients Only)       Balance Overall balance assessment: History of Falls                                           Pertinent Vitals/Pain Pain Assessment: 0-10 Pain Score: 8  Pain Location: low back pain Pain Descriptors / Indicators: Aching;Grimacing Pain Intervention(s): Monitored during session    Home Living Family/patient expects to be discharged to:: Private residence Living Arrangements: Spouse/significant other Available Help at Discharge: Family Type of Home: House Home Access: Stairs to enter   Technical brewer of Steps:  1 Home Layout: One level Home Equipment: Environmental consultant - 2 wheels;Cane - single point;Bedside commode Additional Comments: Typically uses no AD.  Uses SPC if has to walk far    Prior Function Level of Independence: Independent               Hand Dominance   Dominant Hand: Right    Extremity/Trunk Assessment   Upper Extremity Assessment: Overall WFL for tasks assessed           Lower Extremity Assessment: Overall WFL for tasks assessed         Communication   Communication: HOH  Cognition Arousal/Alertness: Awake/alert Behavior During Therapy: WFL for tasks assessed/performed Overall Cognitive Status: Within Functional Limits for tasks assessed                      General Comments      Exercises        Assessment/Plan    PT Assessment Patent does not need any further PT services  PT Diagnosis Difficulty walking   PT Problem List    PT Treatment Interventions     PT Goals (Current goals can be found in the Care Plan section) Acute Rehab PT Goals PT Goal Formulation: All assessment and education complete, DC therapy    Frequency     Barriers to discharge  Co-evaluation               End of Session   Activity Tolerance: Patient tolerated treatment well Patient left: in chair;with call bell/phone within reach (with OT) Nurse Communication: Mobility status         Time: [id number] PT Time Calculation (min) (ACUTE ONLY): 13 min   Charges:   PT Evaluation $Initial PT Evaluation Tier I: 1 Procedure     PT G CodesDuncan Dull 04/14/15, 11:50 AM Alben Deeds, PT DPT  [phone]

## 2015-03-26 NOTE — Progress Notes (Signed)
D/c instructions given, vebalizes understanding. D/c'c to private vehicle via WC. Brilenta scripts given

## 2015-03-26 NOTE — Progress Notes (Addendum)
Patient Name: Mark [name] Date of Encounter: 03/26/2015  Primary Cardiologist: Dr. Bronson Ing   Active Problems:   Positive cardiac stress test   Abnormal nuclear stress test   Cardiomyopathy [facility])   Chronic systolic heart failure (HCC)   ESRD (end stage renal disease) on dialysis [facility])   Pre-operative cardiovascular examination   Abnormal stress test    SUBJECTIVE  Denies any CP or SOB. Lower back very bad, has chronic pain in that region  CURRENT MEDS . aspirin EC  81 mg Oral Daily  . atorvastatin  40 mg Oral QHS  . calcium carbonate  1,250 mg Oral BID WC  . cholecalciferol  1,000 Units Oral Daily  . glipiZIDE  5 mg Oral QAC breakfast  . hydrALAZINE  25 mg Oral TID  . insulin aspart  0-15 Units Subcutaneous TID WC  . iron polysaccharides  150 mg Oral BID  . magnesium oxide  400 mg Oral Daily  . sodium chloride  250 mL Intravenous Once  . sodium chloride  3 mL Intravenous Q12H  . sodium chloride  3 mL Intravenous Q12H  . sodium chloride  3 mL Intravenous Q12H  . ticagrelor  90 mg Oral BID    OBJECTIVE  Filed Vitals:   03/26/15 0707 03/26/15 0730 03/26/15 0800 03/26/15 0830  BP: 110/61 100/54 119/74 95/48  Pulse: 70 62 60 76  Temp:      TempSrc:      Resp: 16 16 9 15   Height:      Weight:      SpO2:        Intake/Output Summary (Last 24 hours) at 03/26/15 0953 Last data filed at 03/26/15 0500  Gross per 24 hour  Intake    980 ml  Output   1650 ml  Net   -670 ml   Filed Weights   03/24/15 1120 03/25/15 0512 03/26/15 0703  Weight: 259 lb 11.2 oz (117.799 kg) 261 lb (118.389 kg) 263 lb 14.3 oz (119.7 kg)    PHYSICAL EXAM  General: Pleasant, NAD. Neuro: Alert and oriented X 3. Moves all extremities spontaneously. Psych: Normal affect. HEENT:  Normal  Neck: Supple without bruits or JVD. Lungs:  Resp regular and unlabored, CTA. Heart: RRR no s3, s4, or murmurs. Some bruising over R femoral cath site, otherwise no obvious hematoma or bleeding,  dressing in place. Abdomen: Soft, non-tender, non-distended, BS + x 4.  Extremities: No clubbing, cyanosis or edema. DP/PT/Radials 2+ and equal bilaterally.  Accessory Clinical Findings  CBC  Recent Labs  03/24/15 1230 03/26/15 0223  WBC 5.2 6.0  HGB 9.2* 7.6*  HCT 28.5* 22.7*  MCV 89.6 87.0  PLT 135* 123456*   Basic Metabolic Panel  Recent Labs  03/25/15 0230 03/26/15 0223  NA 137 136  K 4.1 4.4  CL 100* 100*  CO2 26 25  GLUCOSE 133* 121*  BUN 34* 42*  CREATININE 4.52* 5.09*  CALCIUM 8.4* 8.6*    TELE Some bradycardia in 30s yesterday afternoon, which has since resolved overnight, HR 60-70s this morning and majority of last night.    ECG  NSR with flattening T waves in lead I, AVL and V6.   Echocardiogram 02/12/2015  LV EF: 35% -  40%  ------------------------------------------------------------------- Indications:   Abnormal EKG 794.31. Dyspnea 786.09. Respiratory Failure acute 518.81.  ------------------------------------------------------------------- History:  Risk factors: Hypertension.  ------------------------------------------------------------------- Study Conclusions  - Left ventricle: The cavity size was normal. Wall thickness was increased in a pattern of moderate LVH.  Systolic function was moderately reduced. The estimated ejection fraction was in the range of 35% to 40%. - Aortic valve: There was very mild stenosis. Valve area (VTI): 1.98 cm^2. Valve area (Vmax): 1.99 cm^2. Valve area (Vmean): 1.99 cm^2. - Mitral valve: Valve area by continuity equation (using LVOT flow): 1.78 cm^2. - Left atrium: The atrium was moderately dilated. - Pulmonary arteries: Systolic pressure was mildly increased. PA peak pressure: 35 mm Hg (S).     Radiology/Studies  Nm Myocar Multi W/spect W/wall Motion / Ef  03/22/2015   Horizontal ST segment depression ST segment depression was noted during stress in the II, III, V4 and V5  leads, and returning to baseline after 1-5 minutes of recovery.  This is a high risk study.  Findings consistent with prior myocardial infarction with peri-infarct ischemia.  The left ventricular ejection fraction is moderately decreased (30-44%).  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 EXAM: PORTABLE CHEST 1 VIEW COMPARISON:  02/16/2015 FINDINGS: Placement of right internal jugular dialysis catheter with the tip in the SVC. No pneumothorax. Mild cardiomegaly. No confluent opacities, effusions or edema. No acute bony abnormality. IMPRESSION: Right dialysis catheter tip in the SVC.  No pneumothorax. Mild cardiomegaly.  No acute cardiopulmonary disease. Electronically Signed   By: Rolm Baptise M.D.   On: 03/09/2015 09:07   Dg Fluoro Guide Cv Line-no Report  03/09/2015  CLINICAL DATA:  FLOURO GUIDE CV LINE Fluoroscopy was utilized by the requesting physician.  No radiographic interpretation.    ASSESSMENT AND PLAN  Mr. Prettyman is a 59 yo morbidly obese male with PMH of bradycardia, ESRD newly started on HD and LV dysfunction with EF 35-40% since early Oct 2016 came in for cath today which showed 95% LAD lesion and 100% RCA with collaterals. Following by nephrology. During and post cath, pt had bradycardia with HR 39 to 40s upto 60s. Nurse also noted short apnea episodes. Bradycardia and apnea has since improved after narcotic washout.   1. High risk myoview  - Nuclear stress testing on 03/22/15 demonstrated a high risk study with significant ST segment depression and evidence for prior myocardial infarction with peri-infarct ischemia. The infarct involved the inferior wall from the apex to the base with a small area of septal and apical ischemia,  all suggestive of multivessel disease.  - cath 03/25/2015 95% prox to mid LAD treated with Promus Premier DES 3.5x3mm postdilated to 4.42mm, 100% prox to distal RCA lesion filled in the PDA region via collaterals from LAD and PLA  - per Dr. Ellyn Hack, continue ASA and brilinta, ok to switch to plavix if this is a better option for vascular surgery, although long term brilinta would be better. Ok to Stop ASA after 3 month.  - pt wish to be discharged today to go back to home routine for back pain, will discuss with MD regarding acute on chronic anemia noted below.   2. ESRD newly started on HD 2 wks ago via R tunneled HD catheter  - LUA AVF clotted, per Dr. Lianne Moris office note 10/18, was originally scheduled for [facility] AVF placement on 10/21, however cancelled due to need for cath  - per cath report, will need to discuss with Dr. Bridgett Larsson regarding timing of AVF placement prior  to discharge, I have contacted Dr. Lianne Moris nurse who will forward cath report to Dr. Bridgett Larsson today when he is in the clinic, await decision by vascular surgery prior to discharge.  3. HTN 4. HLD 5. DM2 6. H/o bradycardia: no BB 7. Acute on chronic anemia: hgb dropped from 9.2 to 7.6, has chronic back pain. Unclear if need to r/o retroperitoneal bleed. Will repeat another hgb and hct  Signed, Almyra Deforest PA-C Pager: [phone] Patient seen and examined. I agree with the assessment and plan as detailed above. See also my additional thoughts below.   We will await follow-up hemoglobin. Also we will await further input from vascular surgery concerning planning for vascular procedures. We will decide as the day goes on, if the patient is stable for discharge home. If he does not go home, he needs to be moved out of the coronary care unit. 11:54AM... Repeat hemoglobin is 8.1. This is stable for him. He will be discharged home unless vascular surgery wants to keep him here for another procedure.   Dola Argyle, MD, [facility] 03/26/2015 10:22 AM

## 2015-03-26 NOTE — Progress Notes (Signed)
PT Cancellation Note  Patient Details Name: Mark [name] MRN: [id number] DOB: 08-14-1955   Cancelled Treatment:    Reason Eval/Treat Not Completed: Patient at procedure or test/unavailable. Patient off the floor at this time, will follow up as able.   Duncan Dull 03/26/2015, 7:49 AM Alben Deeds, PT DPT  [phone]

## 2015-03-26 NOTE — Progress Notes (Signed)
OT Cancellation Note    03/26/15 0900  OT Visit Information  Last OT Received On 03/26/15  Reason Eval/Treat Not Completed Patient at procedure or test/ unavailable  [facility], OTR/L  [phone] 03/26/2015

## 2015-03-26 NOTE — Discharge Instructions (Signed)
No driving for 48 hrs. No lifting over 5 lbs for 1 week. No sexual activity for 1 week. Keep procedure site clean & dry. If you notice increased pain, swelling, bleeding or pus, call/return!  You may shower, but no soaking baths/hot tubs/pools for 1 week.

## 2015-03-26 NOTE — Care Management Note (Signed)
Case Management Note  Patient Details  Name: Mark [name] MRN: [id number] Date of Birth: 05-02-1956  Subjective/Objective:    Plan for discharge today - Has Brilinta card for 30 day free and discount on copay.  Encouraged him to go to [facility] on [address] as they are familiar with cards and would for sure get his needed meds prior to this weekend.  Has prescriptions and discount cards - plan to go by outpt pharmacy to pick up.               Action/Plan:   Expected Discharge Date:                  Expected Discharge Plan:  Home/Self Care  In-House Referral:     Discharge planning Services  CM Consult  Post Acute Care Choice:    Choice offered to:     DME Arranged:    DME Agency:     HH Arranged:    [place] Agency:     Status of Service:  Completed, signed off  Medicare Important Message Given:    Date Medicare IM Given:    Medicare IM give by:    Date Additional Medicare IM Given:    Additional Medicare Important Message give by:     If discussed at [place] of Stay Meetings, dates discussed:    Additional Comments:  Vergie Living, RN 03/26/2015, 3:52 PM

## 2015-03-26 NOTE — Progress Notes (Signed)
OT Evaluation  Pt close to baseline regarding ADL. Completed education regarding energy conservation. Pt safe to d/C home with intermittent S. OT signing off.    03/26/15 1155  OT Visit Information  Last OT Received On 03/26/15  Assistance Needed +1  History of Present Illness 59 yo morbidly obese male with PMH of bradycardia, ESRD newly started on HD and LV dysfunction with EF 35-40% since early Oct 2016 presented to [facility] for cardiac cath.   Precautions  Precautions Other (comment) (dialysis catheter - chest)  Restrictions  Weight Bearing Restrictions No  Home Living  Family/patient expects to be discharged to: Private residence  Living Arrangements Spouse/significant other  Available Help at Discharge Family  Type of [place] to enter  Entrance Stairs-Number of Steps 1  [place] One level  Bathroom Biomedical scientist Yes  How Accessible Accessible via walker  [place] - 2 wheels;Cane - single point;BSC;Shower seat  Prior Function  Level of Independence Independent  Comments drives a truck  Engineer, petroleum HOH  Pain Assessment  Pain Assessment 0-10  Pain Score 7  Pain Location back - chronic  Pain Descriptors / Indicators Aching  Pain Intervention(s) Limited activity within patient's tolerance  Cognition  Arousal/Alertness Awake/alert  Behavior During Therapy WFL for tasks assessed/performed  Overall Cognitive Status Within Functional Limits for tasks assessed  Upper Extremity Assessment  Upper Extremity Assessment Overall WFL for tasks assessed  Lower Extremity Assessment  Lower Extremity Assessment Overall WFL for tasks assessed  ADL  Overall ADL's  At baseline  General ADL Comments Educated on energy conservation adn reducing riskof falls at home  Bed Mobility  Overal bed mobility Independent  Transfers  Overall transfer level Independent  Equipment used  None  Balance  Overall balance assessment History of Falls (pt states he "passes out")  OT - End of Session  Activity Tolerance Patient tolerated treatment well  Patient left in chair;with call bell/phone within reach  Nurse Communication Mobility status  OT Assessment  OT Therapy Diagnosis  Generalized weakness  OT Recommendation/Assessment Patient does not need any further OT services  OT Problem List Decreased activity tolerance  OT Recommendation  Follow Up Recommendations No OT follow up;Supervision - Intermittent  OT Equipment None recommended by OT  Acute Rehab OT Goals  Patient Stated Goal [place] home  OT Goal Formulation All assessment and education complete, DC therapy  OT Time Calculation  OT Start Time (ACUTE ONLY) 1144  OT Stop Time (ACUTE ONLY) 1157  OT Time Calculation (min) 13 min  OT General Charges  $OT Visit 1 Procedure  OT Evaluation  $Initial OT Evaluation Tier I 1 Procedure  Written Expression  Dominant Hand Right  Maurie Boettcher, OTR/L  [phone] 03/26/2015

## 2015-03-26 NOTE — Progress Notes (Signed)
  [facility] Progress Note   Subjective: much better, bradycardia episodes resolved.    Filed Vitals:   03/26/15 0707 03/26/15 0730 03/26/15 0800 03/26/15 0830  BP: 110/61 100/54 119/74 95/48  Pulse: 70 62 60 76  Temp:      TempSrc:      Resp: 16 16 9 15   Height:      Weight:      SpO2:       Exam: Alert, no distress, anxious No jvd Chest clear bilat no rales RRR no MRG ABd obese soft ntnd no mass or ascites GU normal  L arm AVF no bruit, R IJ cath No LE edema Neuro is nf, O3  TTS [place] 4h 117 kg 3/2.5 bath Hep 1000 bolus + 500/hr R IJ cath  EPO 5800 tiw   Assessment: 1 MI / CAD - s/p PCI to tight LAD, also has 100% RCA w collaterals that was left alone.      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 If dc'd he is to go to his Sat OP HD session.   Plan -  HD today   Kelly Splinter MD [facility] pager [phone]    cell [phone] 03/26/2015, 9:12 AM    Recent Labs Lab 03/24/15 1230 03/25/15 0230 03/26/15 0223  NA 136 137 136  K 4.2 4.1 4.4  CL 98* 100* 100*  CO2 28 26 25   GLUCOSE 131* 133* 121*  BUN 27* 34* 42*  CREATININE 4.43* 4.52* 5.09*  CALCIUM 8.6* 8.4* 8.6*   No results for input(s): AST, ALT, ALKPHOS, BILITOT, PROT, ALBUMIN in the last 168 hours.  Recent Labs Lab 03/24/15 1230 03/26/15 0223  WBC 5.2 6.0  HGB 9.2* 7.6*  HCT 28.5* 22.7*  MCV 89.6 87.0  PLT 135* 146*   . aspirin EC  81 mg Oral Daily  . atorvastatin  40 mg Oral QHS  . calcium carbonate  1,250 mg Oral BID WC  . cholecalciferol  1,000 Units Oral Daily  . glipiZIDE  5 mg Oral QAC breakfast  . hydrALAZINE  25 mg Oral TID  . insulin aspart  0-15 Units Subcutaneous TID WC  . iron polysaccharides  150 mg Oral BID  . magnesium oxide  400 mg Oral Daily  . sodium chloride  250 mL Intravenous Once  . sodium chloride  3 mL Intravenous  Q12H  . sodium chloride  3 mL Intravenous Q12H  . sodium chloride  3 mL Intravenous Q12H  . ticagrelor  90 mg Oral BID     sodium chloride, sodium chloride, acetaminophen, ALPRAZolam, atropine, morphine injection, nitroGLYCERIN, ondansetron (ZOFRAN) IV, sodium chloride, sodium chloride, traZODone

## 2015-03-26 NOTE — Telephone Encounter (Signed)
LM for pt with appt date/time

## 2015-03-26 NOTE — Progress Notes (Signed)
CARDIAC REHAB PHASE I   PRE:  Rate/Rhythm: 82 SR    BP: sitting 109/66    SaO2: 98 RA  MODE:  Ambulation: 350 ft   POST:  Rate/Rhythm: 109 ST    BP: sitting 126/53     SaO2: 98 RA  Tolerated well. No c/o. sts his back starts hurting after 300 ft or so. VSS. Ed completed including HF, Brilinta, ex as tol, NTG and CRPII. Voiced understanding and requests his referral be sent to [facility]. Pt quit smoking and has been watching his diet.  [place], [place], ACSM 03/26/2015 2:05 PM

## 2015-03-26 NOTE — Telephone Encounter (Signed)
Rec'd call from Cardiology PA.  Requested to have Dr. Bridgett Larsson see pt., in hospital, to discuss rescheduling the surgery for AVF placement for HD.  Stated pt. had a Cardiac Catheterization 10/20 with DES placed., and is on Brilinta and ASA.  Discussed with Dr. Bridgett Larsson, in office.  Recommended to give pt. 1 mo. to recover from recent cardiac cath, and schedule office appt. to discuss/ schedule AVF.  Notified H. Eulas Post, [place] of recommendation.  Advised our office will contact the pt. For an appt.

## 2015-03-30 ENCOUNTER — Ambulatory Visit: Payer: Managed Care, Other (non HMO) | Admitting: Cardiovascular Disease

## 2015-03-31 ENCOUNTER — Ambulatory Visit: Payer: Managed Care, Other (non HMO) | Admitting: Cardiovascular Disease

## 2015-04-02 ENCOUNTER — Other Ambulatory Visit (HOSPITAL_COMMUNITY)
Admission: RE | Admit: 2015-04-02 | Discharge: 2015-04-02 | Disposition: A | Discharge status: Home / Self Care | Payer: Managed Care, Other (non HMO) | Source: Ambulatory Visit | Attending: Cardiovascular Disease | Admitting: Cardiovascular Disease

## 2015-04-02 DIAGNOSIS — Z029 Encounter for administrative examinations, unspecified: Secondary | ICD-10-CM | POA: Diagnosis not present

## 2015-04-02 LAB — CBC
HCT: 22.6 % — ABNORMAL LOW (ref 39.0–52.0)
Hemoglobin: 7.4 g/dL — ABNORMAL LOW (ref 13.0–17.0)
MCH: 30.6 pg (ref 26.0–34.0)
MCHC: 32.7 g/dL (ref 30.0–36.0)
MCV: 93.4 fL (ref 78.0–100.0)
PLATELETS: 205 10*3/uL (ref 150–400)
RBC: 2.42 MIL/uL — ABNORMAL LOW (ref 4.22–5.81)
RDW: 16.5 % — AB (ref 11.5–15.5)
WBC: 7.9 10*3/uL (ref 4.0–10.5)

## 2015-04-12 ENCOUNTER — Ambulatory Visit (INDEPENDENT_AMBULATORY_CARE_PROVIDER_SITE_OTHER): Payer: Managed Care, Other (non HMO) | Admitting: Cardiovascular Disease

## 2015-04-12 ENCOUNTER — Encounter: Payer: Self-pay | Admitting: Cardiovascular Disease

## 2015-04-12 VITALS — BP 152/72 | HR 76 | Ht 72.0 in | Wt 271.0 lb

## 2015-04-12 DIAGNOSIS — I5022 Chronic systolic (congestive) heart failure: Secondary | ICD-10-CM

## 2015-04-12 DIAGNOSIS — I10 Essential (primary) hypertension: Secondary | ICD-10-CM

## 2015-04-12 DIAGNOSIS — Z992 Dependence on renal dialysis: Secondary | ICD-10-CM

## 2015-04-12 DIAGNOSIS — N184 Chronic kidney disease, stage 4 (severe): Secondary | ICD-10-CM

## 2015-04-12 DIAGNOSIS — N186 End stage renal disease: Secondary | ICD-10-CM | POA: Diagnosis not present

## 2015-04-12 DIAGNOSIS — I429 Cardiomyopathy, unspecified: Secondary | ICD-10-CM

## 2015-04-12 DIAGNOSIS — Z955 Presence of coronary angioplasty implant and graft: Secondary | ICD-10-CM | POA: Diagnosis not present

## 2015-04-12 DIAGNOSIS — G4733 Obstructive sleep apnea (adult) (pediatric): Secondary | ICD-10-CM

## 2015-04-12 DIAGNOSIS — R001 Bradycardia, unspecified: Secondary | ICD-10-CM

## 2015-04-12 DIAGNOSIS — I25118 Atherosclerotic heart disease of native coronary artery with other forms of angina pectoris: Secondary | ICD-10-CM

## 2015-04-12 DIAGNOSIS — E785 Hyperlipidemia, unspecified: Secondary | ICD-10-CM

## 2015-04-12 DIAGNOSIS — Z720 Tobacco use: Secondary | ICD-10-CM

## 2015-04-12 MED ORDER — HYDRALAZINE HCL 50 MG PO TABS: 50.0000 mg | ORAL_TABLET | Freq: Three times a day (TID) | ORAL | Status: DC

## 2015-04-12 NOTE — Progress Notes (Signed)
Patient ID: Mark [name], male   DOB: 1955/10/14, 59 y.o.   MRN: [id number]      SUBJECTIVE: The patient presents for posthospitalization follow-up. Coronary angiography on 03/25/15 demonstrated a 95% stenosis in the proximal to mid LAD for which a drug-eluting stent was placed. He also had 100% stenosis of the proximal to distal RCA lesion area the RCA filled in the PDA region via collaterals from the LAD and PL via the circumflex. Long-term Brilinta was recommended with recommendations that aspirin could be stopped after 3 months.  CBC on 10/28 showed hemoglobin 7.4. This is followed by nephrology.  He is feeling much better than he did prior to stent placement and has more energy. He is less short of breath with exertion and denies chest pain. He is being scheduled to have a sleep study. He will likely undergo fistula placement in about a month. He has chronic back pain and is in the process of getting evaluated by a back surgeon at [facility]: As per "subjective", otherwise negative.  No Known Allergies  Current Outpatient Prescriptions  Medication Sig Dispense Refill  . ALPRAZolam (XANAX) 1 MG tablet Take 1 mg by mouth daily as needed for anxiety.     Marland Kitchen aspirin EC 81 MG tablet Take 81 mg by mouth daily.    Marland Kitchen atorvastatin (LIPITOR) 40 MG tablet Take 40 mg by mouth at bedtime.    . cholecalciferol (VITAMIN D) 1000 UNITS tablet Take 1,000 Units by mouth daily.    . furosemide (LASIX) 40 MG tablet Take 80 mg by mouth 2 (two) times daily.     Marland Kitchen glipiZIDE (GLUCOTROL XL) 5 MG 24 hr tablet Take 1 tablet (5 mg total) by mouth daily before breakfast. 30 tablet 0  . hydrALAZINE (APRESOLINE) 25 MG tablet Take 1 tablet (25 mg total) by mouth 3 (three) times daily. 90 tablet 6  . HYDROcodone-acetaminophen (NORCO) 10-325 MG per tablet Take 1 tablet by mouth 2 (two) times daily as needed for severe pain.   0  . magnesium oxide (MAG-OX) 400 MG tablet Take 400 mg by mouth  daily.    . Multiple Vitamins-Minerals (MULTIVITAMIN WITH MINERALS) tablet Take 1 tablet by mouth daily.    . nitroGLYCERIN (NITROSTAT) 0.4 MG SL tablet Place 1 tablet (0.4 mg total) under the tongue every 5 (five) minutes as needed. 25 tablet 3  . POLY-IRON 150 150 MG capsule Take 150 mg by mouth 2 (two) times daily.     . QC CALCIUM FAST DISSOLUTION 600 MG TABS tablet Take 600 mg by mouth 2 (two) times daily with a meal.   4  . ranitidine (ZANTAC) 75 MG tablet Take 75 mg by mouth daily.    . sodium bicarbonate 650 MG tablet Take 650 mg by mouth 3 (three) times daily.   4  . ticagrelor (BRILINTA) 90 MG TABS tablet Take 1 tablet (90 mg total) by mouth 2 (two) times daily. 60 tablet 11  . traZODone (DESYREL) 50 MG tablet Take 50 mg by mouth at bedtime as needed for sleep.      No current facility-administered medications for this visit.    Past Medical History  Diagnosis Date  . Back pain   . Hypertension   . Hypercholesteremia   . Anxiety     occas. panic attack, takes xanax occas  . Hematuria     being followed by Dr. Hinda Lenis for decreased kidney function   . Chronic kidney disease   .  GERD (gastroesophageal reflux disease)     otc- pepcid , approx. every other  day    . Arthritis     herniated disc, lumbar   . Cancer ([place])     - skin ca on face- removed   . Sleep apnea     test aborted, due to not able to relax , since the aborted test he had surgery for gallbladder & he reports that he was told that he has sleep apnea   . Pneumonia     "walking"  . Diabetes mellitus without complication ([place])     type 2  . H/O acute respiratory failure   . History of blood product transfusion 02/13/2014    After having back surgery  . CHF (congestive heart failure) ([place])   . Shortness of breath dyspnea   . Complication of anesthesia     " it takes a long time to get over it."   . Fibromyalgia   . CAD (coronary artery disease)     03/25/2015 95% prox to mid LAD treated with Promus  Premier DES 3.5x40mm postdilated to 4.80mm, 100% prox to distal RCA lesion filled in the PDA region via collaterals from LAD and PLA    Past Surgical History  Procedure Laterality Date  . Cholecystectomy    . Tonsillectomy    . Eye surgery      cataracts remove, bilateral, w/IOL.  . Lumbar laminectomy/decompression microdiscectomy Right 02/13/2014    Procedure: RIGHT LUMBAR FOUR-FIVE, RIGHT LUMBAR FIVE- SACRAL ONE LUMBAR LAMINECTOMY/DECOMPRESSION MICRODISCECTOMY ;  Surgeon: Ashok Pall, MD;  Location: [facility];  Service: Neurosurgery;  Laterality: Right;  Right L4-5 and Right L5-S1 diskectomies  . Av fistula placement Left 02/02/2015    Procedure: LEFT ARM RADIOCEPHALIC ARTERIOVENOUS (AV) FISTULA CREATION;  Surgeon: Conrad Raynham Center, MD;  Location: [facility];  Service: Vascular;  Laterality: Left;  . Insertion of dialysis catheter N/A 03/09/2015    Procedure: INSERTION OF DIALYSIS CATHETER;  Surgeon: Conrad Orrville, MD;  Location: [facility];  Service: Vascular;  Laterality: N/A;  . Back surgery  02/13/14  . Colonoscopy    . Cardiac catheterization N/A 03/25/2015    Procedure: Left Heart Cath and Coronary Angiography;  Surgeon: Leonie Man, MD;  Location: [facility];  Service: Cardiovascular;  Laterality: N/A;  . Cardiac catheterization N/A 03/25/2015    Procedure: Coronary Stent Intervention;  Surgeon: Leonie Man, MD;  Location: [facility];  Service: Cardiovascular;  Laterality: N/A;    Social History   Social History  . Marital Status: Married    Spouse Name: N/A  . Number of Children: N/A  . Years of Education: N/A   Occupational History  . Not on file.   Social History Main Topics  . Smoking status: Former Smoker -- 0.50 packs/day for 40 years    Types: Cigarettes    Start date: 10/17/1973    Quit date: 02/07/2015  . Smokeless tobacco: Never Used  . Alcohol Use: No  . Drug Use: No  . Sexual Activity: Not on file   Other Topics Concern  . Not on file    Social History Narrative     Filed Vitals:   04/12/15 1439  BP: 152/72  Pulse: 76  Height: 6' (1.829 m)  Weight: 271 lb (122.925 kg)  SpO2: 98%    PHYSICAL EXAM General: NAD HEENT: Normal. Neck: No JVD, no thyromegaly. Lungs: Clear to auscultation bilaterally with normal respiratory effort. CV: Regular rate  and rhythm, normal S1/S2, no [id number], 1/6 systolic murmur over RUSB. No pretibial or periankle edema.  Abdomen: Soft, obese, no distention.  Neurologic: Alert and oriented x 3.  Psych: Normal affect. Skin: Normal. Musculoskeletal: Normal range of motion, no gross deformities. Extremities: No clubbing or cyanosis.   ECG: Most recent ECG reviewed.      ASSESSMENT AND PLAN: 1. Chronic systolic heart failure/cardiomyopathy, EF 35-40%: Did not tolerate long-acting nitrates. Unable to use beta blocker due to long history of bradycardia and unable to use ACE inhibitors or ARB's due to end-stage renal disease.  Will increase hydralazine to 50 mg tid. Continue Lasix 80 mg twice daily (co-managed by nephrology).  2. Hyperlipidemia: Lipids from 02/2015 preivously reviewed. Continue Lipitor 40 mg daily.  3. Essential HTN: Mildly elevated today. Will increase hydralazine to 50 mg tid.  4. Bradycardia: HR currently normal.  5. ESRD: On hemodialysis.   6. CAD s/p LAD stent: Stable. Continue ASA 81 mg daily, Brilinta, and Lipitor. Unable to use beta blockers at present due to long history of bradycardia.   Dispo: f/u 4 months.   [name], M.D., F.A.C.C.

## 2015-04-12 NOTE — Patient Instructions (Addendum)
   Increase Hydralazine to 50mg  three times per day - new sent to [facility] today.   Continue all other medications.   Follow up in  4 months

## 2015-05-04 ENCOUNTER — Encounter: Payer: Self-pay | Admitting: Vascular Surgery

## 2015-05-07 ENCOUNTER — Encounter: Payer: Self-pay | Admitting: Vascular Surgery

## 2015-05-07 ENCOUNTER — Ambulatory Visit (INDEPENDENT_AMBULATORY_CARE_PROVIDER_SITE_OTHER): Payer: Managed Care, Other (non HMO) | Admitting: Vascular Surgery

## 2015-05-07 VITALS — BP 166/89 | HR 89 | Temp 97.8°F | Ht 72.0 in | Wt 274.6 lb

## 2015-05-07 DIAGNOSIS — Z992 Dependence on renal dialysis: Secondary | ICD-10-CM

## 2015-05-07 DIAGNOSIS — N186 End stage renal disease: Secondary | ICD-10-CM

## 2015-05-07 NOTE — Progress Notes (Signed)
Brief Access History and Physical  History of Present Illness  Mark [name] is a 59 y.o. male who presents with chief complaint: return for new access.  The patient was scheduled for a L BC AVF after a failed L RC AVF.  His case was canceled due to cardiac issues and he eventually had PCI on 03/25/15.  The continues to HD via a RIJV TDC.  He denies any steal syndrome  Past Medical History  Diagnosis Date  . Back pain   . Hypertension   . Hypercholesteremia   . Anxiety     occas. panic attack, takes xanax occas  . Hematuria     being followed by Dr. Hinda Lenis for decreased kidney function   . Chronic kidney disease   . GERD (gastroesophageal reflux disease)     otc- pepcid , approx. every other  day    . Arthritis     herniated disc, lumbar   . Cancer ([place])     - skin ca on face- removed   . Sleep apnea     test aborted, due to not able to relax , since the aborted test he had surgery for gallbladder & he reports that he was told that he has sleep apnea   . Pneumonia     "walking"  . Diabetes mellitus without complication ([place])     type 2  . H/O acute respiratory failure   . History of blood product transfusion 02/13/2014    After having back surgery  . CHF (congestive heart failure) ([place])   . Shortness of breath dyspnea   . Complication of anesthesia     " it takes a long time to get over it."   . Fibromyalgia   . CAD (coronary artery disease)     03/25/2015 95% prox to mid LAD treated with Promus Premier DES 3.5x3mm postdilated to 4.71mm, 100% prox to distal RCA lesion filled in the PDA region via collaterals from LAD and PLA  . Myocardial infarction [facility])     Past Surgical History  Procedure Laterality Date  . Cholecystectomy    . Tonsillectomy    . Eye surgery      cataracts remove, bilateral, w/IOL.  . Lumbar laminectomy/decompression microdiscectomy Right 02/13/2014    Procedure: RIGHT LUMBAR FOUR-FIVE, RIGHT LUMBAR FIVE- SACRAL ONE LUMBAR  LAMINECTOMY/DECOMPRESSION MICRODISCECTOMY ;  Surgeon: Ashok Pall, MD;  Location: [facility];  Service: Neurosurgery;  Laterality: Right;  Right L4-5 and Right L5-S1 diskectomies  . Av fistula placement Left 02/02/2015    Procedure: LEFT ARM RADIOCEPHALIC ARTERIOVENOUS (AV) FISTULA CREATION;  Surgeon: Conrad Ursina, MD;  Location: [facility];  Service: Vascular;  Laterality: Left;  . Insertion of dialysis catheter N/A 03/09/2015    Procedure: INSERTION OF DIALYSIS CATHETER;  Surgeon: Conrad Branson, MD;  Location: [facility];  Service: Vascular;  Laterality: N/A;  . Back surgery  02/13/14  . Colonoscopy    . Cardiac catheterization N/A 03/25/2015    Procedure: Left Heart Cath and Coronary Angiography;  Surgeon: Leonie Man, MD;  Location: [facility];  Service: Cardiovascular;  Laterality: N/A;  . Cardiac catheterization N/A 03/25/2015    Procedure: Coronary Stent Intervention;  Surgeon: Leonie Man, MD;  Location: [facility];  Service: Cardiovascular;  Laterality: N/A;    Social History   Social History  . Marital Status: Married    Spouse Name: N/A  . Number of Children: N/A  . Years of Education:  N/A   Occupational History  . Not on file.   Social History Main Topics  . Smoking status: Former Smoker -- 0.50 packs/day for 40 years    Types: Cigarettes    Start date: 10/17/1973    Quit date: 02/07/2015  . Smokeless tobacco: Never Used  . Alcohol Use: No  . Drug Use: No  . Sexual Activity: Not on file   Other Topics Concern  . Not on file   Social History Narrative    Family History  Problem Relation Age of Onset  . Adopted: Yes  . Prostate cancer Father   . Cancer Father     prostate  . Heart failure Maternal Grandmother   . Heart attack Maternal Grandfather   . Colon cancer Maternal Grandfather     Current Outpatient Prescriptions on File Prior to Visit  Medication Sig Dispense Refill  . ALPRAZolam (XANAX) 1 MG tablet Take 1 mg by mouth daily as needed  for anxiety.     Marland Kitchen aspirin EC 81 MG tablet Take 81 mg by mouth daily.    Marland Kitchen atorvastatin (LIPITOR) 40 MG tablet Take 40 mg by mouth at bedtime.    . cholecalciferol (VITAMIN D) 1000 UNITS tablet Take 1,000 Units by mouth daily.    . furosemide (LASIX) 40 MG tablet Take 80 mg by mouth 2 (two) times daily.     Marland Kitchen glipiZIDE (GLUCOTROL XL) 5 MG 24 hr tablet Take 1 tablet (5 mg total) by mouth daily before breakfast. 30 tablet 0  . hydrALAZINE (APRESOLINE) 50 MG tablet Take 1 tablet (50 mg total) by mouth 3 (three) times daily. 90 tablet 6  . HYDROcodone-acetaminophen (NORCO) 10-325 MG per tablet Take 1 tablet by mouth 2 (two) times daily as needed for severe pain.   0  . magnesium oxide (MAG-OX) 400 MG tablet Take 400 mg by mouth daily.    . Multiple Vitamins-Minerals (MULTIVITAMIN WITH MINERALS) tablet Take 1 tablet by mouth daily.    . nitroGLYCERIN (NITROSTAT) 0.4 MG SL tablet Place 1 tablet (0.4 mg total) under the tongue every 5 (five) minutes as needed. 25 tablet 3  . POLY-IRON 150 150 MG capsule Take 150 mg by mouth 2 (two) times daily.     . QC CALCIUM FAST DISSOLUTION 600 MG TABS tablet Take 600 mg by mouth 2 (two) times daily with a meal.   4  . ranitidine (ZANTAC) 75 MG tablet Take 75 mg by mouth daily.    . sodium bicarbonate 650 MG tablet Take 650 mg by mouth 3 (three) times daily.   4  . ticagrelor (BRILINTA) 90 MG TABS tablet Take 1 tablet (90 mg total) by mouth 2 (two) times daily. 60 tablet 11  . traZODone (DESYREL) 50 MG tablet Take 50 mg by mouth at bedtime as needed for sleep.      No current facility-administered medications on file prior to visit.    No Known Allergies  Review of Systems: As listed above, otherwise negative.  No further chest pain.  No steal syndrome in L hand  Physical Examination  Filed Vitals:   05/07/15 1113 05/07/15 1116  BP: 177/83 166/89  Pulse: 89   Temp: 97.8 F (36.6 C)   TempSrc: Oral   Height: 6' (1.829 m)   Weight: 274 lb 9.6 oz  (124.558 kg)   SpO2: 98%     General: A&O x 3, WDWN  Pulmonary: Sym exp, good air movt, CTAB, no rales, rhonchi, & wheezing  Cardiac:  RRR, Nl S1, S2, no Murmurs, rubs or gallops  Gastrointestinal: soft, NTND, -G/R, - HSM, - masses, - CVAT B  Musculoskeletal: M/S 5/5 throughout , Extremities without ischemic changes, L wrist incision well healed, large L AC vein  Vascular: palpable L brachial pulse   Medical Decision Making  Evangelos Du is a 59 y.o. male who presents with: ESRD, s/p recent PCI.   The patient is scheduled for: L BC AVF on 14 DEC 16.  Will coordinate with Cardiology changes in antiplatelet use for his operation.  Risk, benefits, and alternatives to access surgery were discussed.  The patient is aware the risks include but are not limited to: bleeding, infection, steal syndrome, nerve damage, ischemic monomelic neuropathy, failure to mature, and need for additional procedures.  The patient is aware of the risks and agrees to proceed.  Adele Barthel, MD [facility] Office: [phone] Pager: [phone]  05/07/2015, 11:27 AM

## 2015-05-10 ENCOUNTER — Other Ambulatory Visit: Payer: Self-pay

## 2015-05-10 ENCOUNTER — Telehealth: Payer: Self-pay | Admitting: *Deleted

## 2015-05-10 NOTE — Telephone Encounter (Signed)
Cannot hold Brilinta even for a day. Cath on 03/25/15 demonstrated a 95% stenosis in the proximal to mid LAD for which a drug-eluting stent was placed. If Mark [name] is held, he risks subacute stent thrombosis, MI, and death.

## 2015-05-10 NOTE — Telephone Encounter (Signed)
Dr. Lianne Moris office scheduled pt 12/15 procedure (in office 05/07/15 OV) Dr. Lianne Moris office requesting if ok to hold Brilinta for 7 days prior to procedure. Will forward to Dr. Bronson Ing

## 2015-05-10 NOTE — Telephone Encounter (Signed)
Will forward to Dr. Lianne Moris office

## 2015-05-11 ENCOUNTER — Telehealth: Payer: Self-pay | Admitting: Cardiovascular Disease

## 2015-05-11 NOTE — Telephone Encounter (Signed)
Mark [name] called in reference to his surgery being cancelled.  He has questions about medication and when the surgery Will be re-scheduled.

## 2015-05-11 NOTE — Telephone Encounter (Signed)
Explained to pt Brilinta medication and risks of holding med in detail, as per previous telephone note on 05/10/15

## 2015-05-17 ENCOUNTER — Ambulatory Visit (HOSPITAL_COMMUNITY)
Admission: RE | Admit: 2015-05-17 | Payer: Managed Care, Other (non HMO) | Source: Ambulatory Visit | Admitting: Vascular Surgery

## 2015-05-17 ENCOUNTER — Encounter (HOSPITAL_COMMUNITY): Admission: RE | Payer: Self-pay | Source: Ambulatory Visit

## 2015-05-17 SURGERY — ARTERIOVENOUS (AV) FISTULA CREATION
Anesthesia: Monitor Anesthesia Care | Laterality: Left

## 2015-05-24 ENCOUNTER — Ambulatory Visit: Payer: Managed Care, Other (non HMO) | Admitting: Cardiovascular Disease

## 2015-06-03 ENCOUNTER — Emergency Department (HOSPITAL_COMMUNITY)
Admission: EM | Admit: 2015-06-03 | Discharge: 2015-06-03 | Disposition: A | Discharge status: Home / Self Care | Payer: Managed Care, Other (non HMO) | Attending: Emergency Medicine | Admitting: Emergency Medicine

## 2015-06-03 ENCOUNTER — Other Ambulatory Visit: Payer: Self-pay

## 2015-06-03 ENCOUNTER — Emergency Department (HOSPITAL_COMMUNITY): Payer: Managed Care, Other (non HMO)

## 2015-06-03 ENCOUNTER — Encounter (HOSPITAL_COMMUNITY): Payer: Self-pay | Admitting: Emergency Medicine

## 2015-06-03 DIAGNOSIS — I2510 Atherosclerotic heart disease of native coronary artery without angina pectoris: Secondary | ICD-10-CM | POA: Diagnosis not present

## 2015-06-03 DIAGNOSIS — R52 Pain, unspecified: Secondary | ICD-10-CM

## 2015-06-03 DIAGNOSIS — E119 Type 2 diabetes mellitus without complications: Secondary | ICD-10-CM | POA: Insufficient documentation

## 2015-06-03 DIAGNOSIS — Z87891 Personal history of nicotine dependence: Secondary | ICD-10-CM | POA: Insufficient documentation

## 2015-06-03 DIAGNOSIS — F419 Anxiety disorder, unspecified: Secondary | ICD-10-CM | POA: Diagnosis not present

## 2015-06-03 DIAGNOSIS — Z8701 Personal history of pneumonia (recurrent): Secondary | ICD-10-CM | POA: Diagnosis not present

## 2015-06-03 DIAGNOSIS — Z9889 Other specified postprocedural states: Secondary | ICD-10-CM | POA: Diagnosis not present

## 2015-06-03 DIAGNOSIS — N189 Chronic kidney disease, unspecified: Secondary | ICD-10-CM | POA: Insufficient documentation

## 2015-06-03 DIAGNOSIS — Z7982 Long term (current) use of aspirin: Secondary | ICD-10-CM | POA: Diagnosis not present

## 2015-06-03 DIAGNOSIS — M25511 Pain in right shoulder: Secondary | ICD-10-CM

## 2015-06-03 DIAGNOSIS — Z7902 Long term (current) use of antithrombotics/antiplatelets: Secondary | ICD-10-CM | POA: Insufficient documentation

## 2015-06-03 DIAGNOSIS — M797 Fibromyalgia: Secondary | ICD-10-CM | POA: Diagnosis not present

## 2015-06-03 DIAGNOSIS — I509 Heart failure, unspecified: Secondary | ICD-10-CM | POA: Insufficient documentation

## 2015-06-03 DIAGNOSIS — M1990 Unspecified osteoarthritis, unspecified site: Secondary | ICD-10-CM | POA: Diagnosis not present

## 2015-06-03 DIAGNOSIS — G8929 Other chronic pain: Secondary | ICD-10-CM | POA: Diagnosis not present

## 2015-06-03 DIAGNOSIS — I129 Hypertensive chronic kidney disease with stage 1 through stage 4 chronic kidney disease, or unspecified chronic kidney disease: Secondary | ICD-10-CM | POA: Insufficient documentation

## 2015-06-03 DIAGNOSIS — Z85828 Personal history of other malignant neoplasm of skin: Secondary | ICD-10-CM | POA: Insufficient documentation

## 2015-06-03 DIAGNOSIS — Z7984 Long term (current) use of oral hypoglycemic drugs: Secondary | ICD-10-CM | POA: Insufficient documentation

## 2015-06-03 DIAGNOSIS — I252 Old myocardial infarction: Secondary | ICD-10-CM | POA: Insufficient documentation

## 2015-06-03 DIAGNOSIS — M79601 Pain in right arm: Secondary | ICD-10-CM | POA: Diagnosis present

## 2015-06-03 DIAGNOSIS — M545 Low back pain: Secondary | ICD-10-CM | POA: Diagnosis not present

## 2015-06-03 DIAGNOSIS — K219 Gastro-esophageal reflux disease without esophagitis: Secondary | ICD-10-CM | POA: Diagnosis not present

## 2015-06-03 DIAGNOSIS — E7800 Pure hypercholesterolemia, unspecified: Secondary | ICD-10-CM | POA: Diagnosis not present

## 2015-06-03 LAB — BASIC METABOLIC PANEL
ANION GAP: 12 (ref 5–15)
BUN: 51 mg/dL — AB (ref 6–20)
CALCIUM: 9 mg/dL (ref 8.9–10.3)
CO2: 26 mmol/L (ref 22–32)
Chloride: 97 mmol/L — ABNORMAL LOW (ref 101–111)
Creatinine, Ser: 5.17 mg/dL — ABNORMAL HIGH (ref 0.61–1.24)
GFR calc Af Amer: 13 mL/min — ABNORMAL LOW (ref >60)
GFR, EST NON AFRICAN AMERICAN: 11 mL/min — AB (ref >60)
Glucose, Bld: 208 mg/dL — ABNORMAL HIGH (ref 65–99)
POTASSIUM: 5.4 mmol/L — AB (ref 3.5–5.1)
SODIUM: 135 mmol/L (ref 135–145)

## 2015-06-03 LAB — CBC WITH DIFFERENTIAL/PLATELET
BASOS ABS: 0 10*3/uL (ref 0.0–0.1)
BASOS PCT: 0 %
EOS PCT: 2 %
Eosinophils Absolute: 0.2 10*3/uL (ref 0.0–0.7)
HCT: 31.8 % — ABNORMAL LOW (ref 39.0–52.0)
Hemoglobin: 10.4 g/dL — ABNORMAL LOW (ref 13.0–17.0)
LYMPHS PCT: 6 %
Lymphs Abs: 0.6 10*3/uL — ABNORMAL LOW (ref 0.7–4.0)
MCH: 30.7 pg (ref 26.0–34.0)
MCHC: 32.7 g/dL (ref 30.0–36.0)
MCV: 93.8 fL (ref 78.0–100.0)
MONO ABS: 1 10*3/uL (ref 0.1–1.0)
Monocytes Relative: 10 %
NEUTROS ABS: 7.8 10*3/uL — AB (ref 1.7–7.7)
Neutrophils Relative %: 82 %
PLATELETS: 180 10*3/uL (ref 150–400)
RBC: 3.39 MIL/uL — AB (ref 4.22–5.81)
RDW: 15.1 % (ref 11.5–15.5)
WBC: 9.6 10*3/uL (ref 4.0–10.5)

## 2015-06-03 LAB — CBG MONITORING, ED: GLUCOSE-CAPILLARY: 242 mg/dL — AB (ref 65–99)

## 2015-06-03 MED ORDER — HYDROMORPHONE HCL 1 MG/ML IJ SOLN
1.0000 mg | Freq: Once | INTRAMUSCULAR | Status: AC
  Administered 2015-06-03: 1 mg via INTRAVENOUS
  Filled 2015-06-03: qty 1

## 2015-06-03 MED ORDER — HYDROCODONE-ACETAMINOPHEN 5-325 MG PO TABS
1.0000 | ORAL_TABLET | Freq: Once | ORAL | Status: AC
  Administered 2015-06-03: 1 via ORAL
  Filled 2015-06-03: qty 1

## 2015-06-03 MED ORDER — LORAZEPAM 2 MG/ML IJ SOLN
1.0000 mg | Freq: Once | INTRAMUSCULAR | Status: AC
  Administered 2015-06-03: 1 mg via INTRAVENOUS
  Filled 2015-06-03: qty 1

## 2015-06-03 NOTE — ED Notes (Signed)
PT c/o right shoulder/arm pain radiating to hand with numbness to finger tips that started this am and went to dialysis for 2.5hrs and stopped before completion to come to ED for swelling and pain to right arm.

## 2015-06-03 NOTE — Discharge Instructions (Signed)
Wear the sling as needed for comfort.. Take Tylenol for mild pain or your hydrocodone for back pain. Don't take Tylenol together with hydrocodone as the combination to be dangerous to her liver. Call Dr. Scotty Court if pain is not well controlled. Call your dialysis doctor tomorrow to see if you need more dialysis. L him your potassium today was mildly elevated at 5.4 How to Use a Sling A sling is a type of hanging bandage. You wear it around your neck to protect an injured arm, shoulder, or other body part. You may need to wear a sling to keep you from moving the injured body part while it heals. Keeping the injured part of your body still reduces pain and speeds up healing. Your doctor may suggest you use a sling if you have:  A broken arm.  A broken collarbone.  A shoulder injury.  Surgery. RISKS AND COMPLICATIONS Wearing a sling the wrong way can:  Make your injury worse.  Cause stiffness or numbness.  Affect blood circulation in your arm and hand. This can cause tingling or numbness in your fingers or hands. HOW TO USE A SLING The way that you should use a sling depends on your injury. It is important that you follow all of your doctor's instructions for your injury. Also follow these general suggestions:  Wear the sling so that your arm bends 90 degrees at the elbow. That is like a right angle or the shape of a capital letter "L." The sling should also support your wrist and hand.  Try not to move your arm.  Do not lie down flat on your back while you have to wear a sling. Sleep in a recliner or use pillows to raise your upper body in bed.  Do not twist, raise, or move your arm in a way that could make your injury worse.  Do not lean on your arm while you have to wear a sling.  Do not lift anything while you have to wear a sling. GET HELP IF:  You have bruising, swelling, or pain that is getting worse.  Your pain medicine is not helping.  You have a fever. GET HELP RIGHT AWAY  IF:  Your fingers are numb or tingling.  Your fingers turn blue or feel cold to the touch.  You cannot control the bleeding from your injury.  You are short of breath.   This information is not intended to replace advice given to you by your health care provider. Make sure you discuss any questions you have with your health care provider.   Document Released: 08/16/2009 Document Revised: 06/12/2014 Document Reviewed: 03/25/2014 Elsevier Interactive Patient Education [employer].

## 2015-06-03 NOTE — ED Provider Notes (Signed)
CSN: [id number]     Arrival date & time 06/03/15  1245 History   First MD Initiated Contact with Patient 06/03/15 1438     Chief Complaint  Patient presents with  . Arm Pain     (Consider location/radiation/quality/duration/timing/severity/associated sxs/prior Treatment) HPI Complains of right shoulder pain onset upon awakening 4 AM today. Pain is worse with moving his shoulder in any direction improved with remaining still. He treated himself with hydrocodone, without relief. No fever no shortness of breath. Patient stopped his dialysis session early after 2.5 hours due to pain. He initially complained of numbness in the fingers of his right hand but numbness has since resolved No other associated symptoms. No fever no trauma. He's never had similar symptoms before. Past Medical History  Diagnosis Date  . Back pain   . Hypertension   . Hypercholesteremia   . Anxiety     occas. panic attack, takes xanax occas  . Hematuria     being followed by Dr. Hinda Lenis for decreased kidney function   . Chronic kidney disease   . GERD (gastroesophageal reflux disease)     otc- pepcid , approx. every other  day    . Arthritis     herniated disc, lumbar   . Cancer ([place])     - skin ca on face- removed   . Sleep apnea     test aborted, due to not able to relax , since the aborted test he had surgery for gallbladder & he reports that he was told that he has sleep apnea   . Pneumonia     "walking"  . Diabetes mellitus without complication ([place])     type 2  . H/O acute respiratory failure   . History of blood product transfusion 02/13/2014    After having back surgery  . CHF (congestive heart failure) ([place])   . Shortness of breath dyspnea   . Complication of anesthesia     " it takes a long time to get over it."   . Fibromyalgia   . CAD (coronary artery disease)     03/25/2015 95% prox to mid LAD treated with Promus Premier DES 3.5x66mm postdilated to 4.6mm, 100% prox to distal RCA lesion  filled in the PDA region via collaterals from LAD and PLA  . Myocardial infarction [facility])    Past Surgical History  Procedure Laterality Date  . Cholecystectomy    . Tonsillectomy    . Eye surgery      cataracts remove, bilateral, w/IOL.  . Lumbar laminectomy/decompression microdiscectomy Right 02/13/2014    Procedure: RIGHT LUMBAR FOUR-FIVE, RIGHT LUMBAR FIVE- SACRAL ONE LUMBAR LAMINECTOMY/DECOMPRESSION MICRODISCECTOMY ;  Surgeon: Ashok Pall, MD;  Location: [place] [facility];  Service: Neurosurgery;  Laterality: Right;  Right L4-5 and Right L5-S1 diskectomies  . Av fistula placement Left 02/02/2015    Procedure: LEFT ARM RADIOCEPHALIC ARTERIOVENOUS (AV) FISTULA CREATION;  Surgeon: Conrad Cleves, MD;  Location: [place];  Service: Vascular;  Laterality: Left;  . Insertion of dialysis catheter N/A 03/09/2015    Procedure: INSERTION OF DIALYSIS CATHETER;  Surgeon: Conrad Cohasset, MD;  Location: [place];  Service: Vascular;  Laterality: N/A;  . Back surgery  02/13/14  . Colonoscopy    . Cardiac catheterization N/A 03/25/2015    Procedure: Left Heart Cath and Coronary Angiography;  Surgeon: Leonie Man, MD;  Location: [place] CV LAB;  Service: Cardiovascular;  Laterality: N/A;  . Cardiac catheterization N/A 03/25/2015    Procedure: Coronary Stent  Intervention;  Surgeon: Leonie Man, MD;  Location: [place] CV LAB;  Service: Cardiovascular;  Laterality: N/A;   Family History  Problem Relation Age of Onset  . Adopted: Yes  . Prostate cancer Father   . Cancer Father     prostate  . Heart failure Maternal Grandmother   . Heart attack Maternal Grandfather   . Colon cancer Maternal Grandfather    Social History  Substance Use Topics  . Smoking status: Former Smoker -- 0.50 packs/day for 40 years    Types: Cigarettes    Start date: 10/17/1973    Quit date: 02/07/2015  . Smokeless tobacco: Never Used  . Alcohol Use: No    Review of Systems  Musculoskeletal: Positive for back pain and  arthralgias.       Chronic back pain      Allergies  Review of patient's allergies indicates no known allergies.  Home Medications   Prior to Admission medications   Medication Sig Start Date End Date Taking? Authorizing Provider  ALPRAZolam Duanne Moron) 1 MG tablet Take 1 mg by mouth daily as needed for anxiety.  12/21/14   Historical Provider, MD  aspirin EC 81 MG tablet Take 81 mg by mouth daily.    Historical Provider, MD  atorvastatin (LIPITOR) 40 MG tablet Take 40 mg by mouth at bedtime.    Historical Provider, MD  cholecalciferol (VITAMIN D) 1000 UNITS tablet Take 1,000 Units by mouth daily.    Historical Provider, MD  furosemide (LASIX) 40 MG tablet Take 80 mg by mouth 2 (two) times daily.     Historical Provider, MD  glipiZIDE (GLUCOTROL XL) 5 MG 24 hr tablet Take 1 tablet (5 mg total) by mouth daily before breakfast. 02/15/15   Thurnell Lose, MD  hydrALAZINE (APRESOLINE) 25 MG tablet  03/16/15   Historical Provider, MD  hydrALAZINE (APRESOLINE) 50 MG tablet Take 1 tablet (50 mg total) by mouth 3 (three) times daily. 04/12/15   Herminio Commons, MD  HYDROcodone-acetaminophen (NORCO) 10-325 MG per tablet Take 1 tablet by mouth 2 (two) times daily as needed for severe pain.  02/10/15   Historical Provider, MD  isosorbide mononitrate (IMDUR) 30 MG 24 hr tablet  02/15/15   Historical Provider, MD  magnesium oxide (MAG-OX) 400 MG tablet Take 400 mg by mouth daily.    Historical Provider, MD  metolazone (ZAROXOLYN) 5 MG tablet  02/11/15   Historical Provider, MD  Multiple Vitamins-Minerals (MULTIVITAMIN WITH MINERALS) tablet Take 1 tablet by mouth daily.    Historical Provider, MD  nitroGLYCERIN (NITROSTAT) 0.4 MG SL tablet Place 1 tablet (0.4 mg total) under the tongue every 5 (five) minutes as needed. 03/23/15   Herminio Commons, MD  oxyCODONE-acetaminophen (PERCOCET/ROXICET) 5-325 MG tablet  03/09/15   Historical Provider, MD  POLY-IRON 150 150 MG capsule Take 150 mg by mouth 2 (two)  times daily.  01/18/15   Historical Provider, MD  QC CALCIUM FAST DISSOLUTION 600 MG TABS tablet Take 600 mg by mouth 2 (two) times daily with a meal.  01/02/15   Historical Provider, MD  ranitidine (ZANTAC) 75 MG tablet Take 75 mg by mouth daily.    Historical Provider, MD  sodium bicarbonate 650 MG tablet Take 650 mg by mouth 3 (three) times daily.  01/09/15   Historical Provider, MD  ticagrelor (BRILINTA) 90 MG TABS tablet Take 1 tablet (90 mg total) by mouth 2 (two) times daily. 03/26/15   Almyra Deforest, PA  traZODone (DESYREL) 50 MG  tablet Take 50 mg by mouth at bedtime as needed for sleep.     Historical Provider, MD   BP 176/109 mmHg  Pulse 75  Temp(Src) 97.9 F (36.6 C) (Oral)  Resp 24  Ht 6' (1.829 m)  Wt 235 lb (106.595 kg)  BMI 31.86 kg/m2  SpO2 100% Physical Exam  Constitutional: He appears well-developed and well-nourished. He appears distressed.  Appears uncomfortable  HENT:  Head: Normocephalic and atraumatic.  Eyes: Conjunctivae are normal. Pupils are equal, round, and reactive to light.  Neck: Neck supple. No tracheal deviation present. No thyromegaly present.  Cardiovascular: Normal rate and regular rhythm.   No murmur heard. Pulmonary/Chest: Effort normal and breath sounds normal.  Abdominal: Soft. Bowel sounds are normal. He exhibits no distension. There is no tenderness.  Obese  Musculoskeletal: Normal range of motion. He exhibits no edema or tenderness.  Right upper extremity no swelling no deformity. Limited range of motion of shoulder secondary to pain. He is tender at before meals joint and at anterior shoulder. No redness or warmth. Radial pulse 2+. Capillary refill. There is a dialysis catheter at right subclavian area. Not red warm or tender at site All other extremities without redness swelling or tenderness neurovascularly intact  Neurological: He is alert. Coordination normal.  Skin: Skin is warm and dry. No rash noted.  Psychiatric: He has a normal mood and  affect.  Nursing note and vitals reviewed.   ED Course  Procedures (including critical care time) Labs Review Labs Reviewed  CBG MONITORING, ED - Abnormal; Notable for the following:    Glucose-Capillary 242 (*)    All other components within normal limits  CBG MONITORING, ED    Imaging Review No results found. I have personally reviewed and evaluated these images and lab results as part of my medical decision-making.   EKG Interpretation None     6:25 PM acing got temporary relief after treatment with intravenous hydromorphone and intravenous Ativan. He states his pain is returning. He'll be given a Norco tablet prior to discharge as well as a sling for his right arm. Results for orders placed or performed during the hospital encounter of [id number]  Basic metabolic panel  Result Value Ref Range   Sodium 135 135 - 145 mmol/L   Potassium 5.4 (H) 3.5 - 5.1 mmol/L   Chloride 97 (L) 101 - 111 mmol/L   CO2 26 22 - 32 mmol/L   Glucose, Bld 208 (H) 65 - 99 mg/dL   BUN 51 (H) 6 - 20 mg/dL   Creatinine, Ser 5.17 (H) 0.61 - 1.24 mg/dL   Calcium 9.0 8.9 - 10.3 mg/dL   GFR calc non Af Amer 11 (L) >60 mL/min   GFR calc Af Amer 13 (L) >60 mL/min   Anion gap 12 5 - 15  CBC with Differential/Platelet  Result Value Ref Range   WBC 9.6 4.0 - 10.5 K/uL   RBC 3.39 (L) 4.22 - 5.81 MIL/uL   Hemoglobin 10.4 (L) 13.0 - 17.0 g/dL   HCT 31.8 (L) 39.0 - 52.0 %   MCV 93.8 78.0 - 100.0 fL   MCH 30.7 26.0 - 34.0 pg   MCHC 32.7 30.0 - 36.0 g/dL   RDW 15.1 11.5 - 15.5 %   Platelets 180 150 - 400 K/uL   Neutrophils Relative % 82 %   Neutro Abs 7.8 (H) 1.7 - 7.7 K/uL   Lymphocytes Relative 6 %   Lymphs Abs 0.6 (L) 0.7 - 4.0 K/uL  Monocytes Relative 10 %   Monocytes Absolute 1.0 0.1 - 1.0 K/uL   Eosinophils Relative 2 %   Eosinophils Absolute 0.2 0.0 - 0.7 K/uL   Basophils Relative 0 %   Basophils Absolute 0.0 0.0 - 0.1 K/uL  CBG monitoring, ED  Result Value Ref Range   Glucose-Capillary 242  (H) 65 - 99 mg/dL   Ct Shoulder Right Wo Contrast  06/03/2015  CLINICAL DATA:  Right shoulder and arm pain radiating to the hand with numbness in the finger tips since this morning. Right arm swelling. The patient was too large for an MRI of the shoulder. No known injury. EXAM: CT OF THE RIGHT SHOULDER WITHOUT CONTRAST TECHNIQUE: Multidetector CT imaging was performed according to the standard protocol. Multiplanar CT image reconstructions were also generated. COMPARISON:  None. FINDINGS: There is some limitation due to patient motion. Acromioclavicular joint degenerative changes. Minimal glenohumeral joint degenerative changes. Mild hyperostosis and subcortical cyst formation involving the lateral aspect of the head of the humerus at the superior aspect of the greater tuberosity. No soft tissue abnormalities are seen. The interstitial markings in the included lung are prominent with probable pleural fluid on the right. IMPRESSION: 1. No acute abnormality of the shoulder visualized. 2. Mild to moderate acromioclavicular joint degenerative changes and minimal glenohumeral joint degenerative changes. 3. Prominent interstitial markings in the included lung with a probable small right pleural effusion, possibly due to congestive heart failure. Correlation with chest radiographs is recommended. Electronically Signed   By: Claudie Revering M.D.   On: 06/03/2015 17:55    MDM  Patient could not fit into MRI scanner so decision was made to obtain CT scan of shoulder instead, in conjunction with radiologist. Since he has been dialysis patient for less than 6 months CT to be obtained without contrast Final diagnoses:  None   pain appears to be musculoskeleta in etiology  Anemia is chronic and improved over previous visit hyperkalemia is mild , does not require further treatment.There is no evidence of infection. Clinically patient does not have congestive heart failure he is lying flat and denies dyspnea. He did not say  for his entire hemodialysis session today. Patient can take his own hydrocodone at home. He is encouraged to follow-up with Dr. Scotty Court if pain continues. He will get sling prior to discharge Diagnoses# 1 right shoulder pain #2 hyperglycemia #3 anemia #4 hyperkalemia  Orlie Dakin, MD 06/03/15 [id number]

## 2015-06-03 NOTE — ED Notes (Signed)
Pt brought back from MRI-- is too large to fit into MRI scanner-- MD informed

## 2015-07-19 ENCOUNTER — Institutional Professional Consult (permissible substitution): Payer: Managed Care, Other (non HMO) | Admitting: Neurology

## 2015-07-22 ENCOUNTER — Encounter: Payer: Self-pay | Admitting: Neurology

## 2015-07-22 ENCOUNTER — Ambulatory Visit (INDEPENDENT_AMBULATORY_CARE_PROVIDER_SITE_OTHER): Payer: Managed Care, Other (non HMO) | Admitting: Neurology

## 2015-07-22 VITALS — BP 170/92 | HR 78 | Resp 18 | Ht 72.0 in | Wt 276.0 lb

## 2015-07-22 DIAGNOSIS — I252 Old myocardial infarction: Secondary | ICD-10-CM | POA: Diagnosis not present

## 2015-07-22 DIAGNOSIS — Z955 Presence of coronary angioplasty implant and graft: Secondary | ICD-10-CM | POA: Diagnosis not present

## 2015-07-22 DIAGNOSIS — E669 Obesity, unspecified: Secondary | ICD-10-CM

## 2015-07-22 DIAGNOSIS — N186 End stage renal disease: Secondary | ICD-10-CM | POA: Diagnosis not present

## 2015-07-22 DIAGNOSIS — G4733 Obstructive sleep apnea (adult) (pediatric): Secondary | ICD-10-CM

## 2015-07-22 DIAGNOSIS — Z992 Dependence on renal dialysis: Secondary | ICD-10-CM

## 2015-07-22 NOTE — Patient Instructions (Addendum)
Based on your symptoms and your exam I believe you are at risk for obstructive sleep apnea or OSA, and I think we should proceed with a sleep study to determine whether you do or do not have OSA and how severe it is. If you have more than mild OSA, I want you to consider treatment with CPAP. Please remember, the risks and ramifications of moderate to severe obstructive sleep apnea or OSA are: Cardiovascular disease, including congestive heart failure, stroke, difficult to control hypertension, arrhythmias, and even type 2 diabetes has been linked to untreated OSA. Sleep apnea causes disruption of sleep and sleep deprivation in most cases, which, in turn, can cause recurrent headaches, problems with memory, mood, concentration, focus, and vigilance. Most people with untreated sleep apnea report excessive daytime sleepiness, which can affect their ability to drive. Please do not drive if you feel sleepy.   I will likely see you back after your sleep study to go over the test results and where to go from there. We will call you after your sleep study to advise about the results (most likely, you will hear from Beverlee Nims, my nurse) and to set up an appointment at the time, as necessary.    Our sleep lab administrative assistant, Arrie Aran will meet with you or call you to schedule your sleep study. If you don't hear back from her by next week please feel free to call her at [phone]. This is her direct line and please leave a message with your phone number to call back if you get the voicemail box. She will call back as soon as possible.   We will schedule you after your insurance change to Medicare. Please call at the time and I will put your sleep study order in at the time.

## 2015-07-22 NOTE — Progress Notes (Signed)
Subjective:    Patient ID: Mark [name] is a 60 y.o. male.  HPI     Star Age, MD, PhD [facility] [address] [address] [place], [place] [address]  Dear Dr. Scotty Court,   I saw your patient, Mark [name], upon your kind request in my neurologic clinic today for initial consultation of his sleep disorder, in particular, concern for underlying obstructive sleep apnea. The patient is unaccompanied today. As you know, Mark [name] is a 60 year old right-handed gentleman with an underlying complex medical history of coronary artery disease, status post stent placement on 03/25/2015, congestive heart failure, chronic back pain, hypertension, hyperlipidemia, anxiety, end-stage kidney disease on dialysis since October 2016, type 2 diabetes, obesity, recent smoking cessation in September 2016, status post multiple surgeries including cholecystectomy, AV fistula placement, tonsillectomy, cataract removal, lumbar laminectomy and micro-discectomy with decompression on 02/13/2014, left heart catheterization with stent placement on 03/25/2015, who reports snoring, excessive daytime somnolence, gasping for breath at night and witnessed apneic pauses per wife.He endorses waking up with a sense of gasping occasionally. He has very rare morning headaches. He has stopped smoking in October 2016. He does not drink alcohol. He drinks diet soda. He is supposed to limit his fluid intake to 32 ounces but because he still produces urine he can also drink ad lib. He will need a redo of his AV fistula. Currently he has a port in place. He goes to dialysis Mondays, Wednesdays and Fridays. He does come home exhausted after that and takes a nap. Generally speaking, he will go to bed around 10:30 to 11 PM, he wakes up early around 4:05 AM because he needs his pain medication at the time. He has low back pain. He takes hydrocodone 2-3 times a day. He tries to limit his hydrocodone. He has dialysis  locally in [place]. He had attempted sleep study 3 of 4 years ago but did not finish the test as I understand.this was at [facility]. He said he could not sleep. It was in July and it was too hot. He has not sure you, once or twice per night with some urine production. His Epworth sleepiness score is 11 out of 24 today, his fatigue score is 41 out of 63. He lives at home with his wife. He has 2 grandchildren. 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 He is on disability and awaiting his Medicare approval. I reviewed your office note from 03/29/2015, which you kindly included.  His Past Medical History Is Significant For: Past Medical History  Diagnosis Date  . Back pain   . Hypertension   . Hypercholesteremia   . Anxiety     occas. panic attack, takes xanax occas  . Hematuria     being followed by Dr. Hinda Lenis for decreased kidney function   . Chronic kidney disease   . GERD (gastroesophageal reflux disease)     otc- pepcid , approx. every other  day    . Arthritis     herniated disc, lumbar   . Cancer ([place])     - skin ca on face- removed   . Sleep apnea     test aborted, due to not able to relax , since the aborted test he had surgery for gallbladder & he reports that he was told that he has sleep apnea   . Pneumonia     "walking"  . Diabetes mellitus without complication ([place])     type 2  .  H/O acute respiratory failure   . History of blood product transfusion 02/13/2014    After having back surgery  . CHF (congestive heart failure) ([place])   . Shortness of breath dyspnea   . Complication of anesthesia     " it takes a long time to get over it."   . Fibromyalgia   . CAD (coronary artery disease)     03/25/2015 95% prox to mid LAD treated with Promus Premier DES 3.5x28mm postdilated to 4.87mm, 100% prox to distal RCA lesion filled in the PDA region via collaterals from LAD and PLA  . Myocardial infarction [facility])     His Past Surgical History Is Significant  For: Past Surgical History  Procedure Laterality Date  . Cholecystectomy    . Tonsillectomy    . Eye surgery      cataracts remove, bilateral, w/IOL.  . Lumbar laminectomy/decompression microdiscectomy Right 02/13/2014    Procedure: RIGHT LUMBAR FOUR-FIVE, RIGHT LUMBAR FIVE- SACRAL ONE LUMBAR LAMINECTOMY/DECOMPRESSION MICRODISCECTOMY ;  Surgeon: Ashok Pall, MD;  Location: [place] [facility];  Service: Neurosurgery;  Laterality: Right;  Right L4-5 and Right L5-S1 diskectomies  . Av fistula placement Left 02/02/2015    Procedure: LEFT ARM RADIOCEPHALIC ARTERIOVENOUS (AV) FISTULA CREATION;  Surgeon: Conrad Racine, MD;  Location: [place];  Service: Vascular;  Laterality: Left;  . Insertion of dialysis catheter N/A 03/09/2015    Procedure: INSERTION OF DIALYSIS CATHETER;  Surgeon: Conrad Lake Barcroft, MD;  Location: [place];  Service: Vascular;  Laterality: N/A;  . Back surgery  02/13/14  . Colonoscopy    . Cardiac catheterization N/A 03/25/2015    Procedure: Left Heart Cath and Coronary Angiography;  Surgeon: Leonie Man, MD;  Location: [place] [facility];  Service: Cardiovascular;  Laterality: N/A;  . Cardiac catheterization N/A 03/25/2015    Procedure: Coronary Stent Intervention;  Surgeon: Leonie Man, MD;  Location: [place] [facility];  Service: Cardiovascular;  Laterality: N/A;    His Family History Is Significant For: Family History  Problem Relation Age of Onset  . Adopted: Yes  . Prostate cancer Father   . Cancer Father     prostate  . Heart failure Maternal Grandmother   . Heart attack Maternal Grandfather   . Colon cancer Maternal Grandfather     His Social History Is Significant For: Social History   Social History  . Marital Status: Married    Spouse Name: N/A  . Number of Children: 2  . Years of Education: HS   Social History Main Topics  . Smoking status: Former Smoker -- 0.50 packs/day for 40 years    Types: Cigarettes    Start date: 10/17/1973    Quit date: 02/07/2015   . Smokeless tobacco: Never Used  . Alcohol Use: No  . Drug Use: No  . Sexual Activity: Not Asked   Other Topics Concern  . None   Social History Narrative   Drinks 3 Diet sodas a day     His Allergies Are:  No Known Allergies:   His Current Medications Are:  Outpatient Encounter Prescriptions as of 07/22/2015  Medication Sig  . ALPRAZolam (XANAX) 1 MG tablet Take 1 mg by mouth 2 (two) times daily as needed for anxiety.   Marland Kitchen aspirin EC 81 MG tablet Take 81 mg by mouth daily.  Marland Kitchen atorvastatin (LIPITOR) 40 MG tablet Take 40 mg by mouth at bedtime.  . Calcium Carbonate (CALCIUM 600 PO) Take 2 tablets by mouth daily.  Marland Kitchen  calcium carbonate (TUMS - DOSED IN MG ELEMENTAL CALCIUM) 500 MG chewable tablet Chew 1 tablet by mouth daily as needed for indigestion or heartburn.  . cholecalciferol (VITAMIN D) 1000 UNITS tablet Take 1,000 Units by mouth daily.  Marland Kitchen glipiZIDE (GLUCOTROL XL) 5 MG 24 hr tablet Take 1 tablet (5 mg total) by mouth daily before breakfast. (Patient taking differently: Take 10 mg by mouth daily before breakfast. )  . hydrALAZINE (APRESOLINE) 50 MG tablet Take 1 tablet (50 mg total) by mouth 3 (three) times daily.  Marland Kitchen HYDROcodone-acetaminophen (NORCO) 10-325 MG per tablet Take 1 tablet by mouth 2 (two) times daily as needed for severe pain.   . nitroGLYCERIN (NITROSTAT) 0.4 MG SL tablet Place 1 tablet (0.4 mg total) under the tongue every 5 (five) minutes as needed.  . ranitidine (ZANTAC) 150 MG tablet Take 150 mg by mouth daily as needed for heartburn.  . sodium bicarbonate 650 MG tablet Take 650 mg by mouth 3 (three) times daily.   . ticagrelor (BRILINTA) 90 MG TABS tablet Take 1 tablet (90 mg total) by mouth 2 (two) times daily.  . traZODone (DESYREL) 50 MG tablet Take 50 mg by mouth at bedtime as needed for sleep.   . [DISCONTINUED] magnesium oxide (MAG-OX) 400 MG tablet Take 400 mg by mouth daily.  . [DISCONTINUED] Multiple Vitamins-Minerals (MULTIVITAMIN WITH MINERALS) tablet  Take 1 tablet by mouth daily.  . [DISCONTINUED] POLY-IRON 150 150 MG capsule Take 150 mg by mouth 2 (two) times daily.    No facility-administered encounter medications on file as of 07/22/2015.  :  Review of Systems:  Out of a complete 14 point review of systems, all are reviewed and negative with the exception of these symptoms as listed below:   Review of Systems  Musculoskeletal:       Joint pain, cramps, aching muscles   Neurological:       Patient has trouble falling and staying asleep, snoring, wakes up gasping for air, wakes up feeling tired, daytime tiredness, headaches, restless legs.   Epworth Sleepiness Scale 0= would never doze 1= slight chance of dozing 2= moderate chance of dozing 3= high chance of dozing  Sitting and reading:1 Watching TV:2 Sitting inactive in a public place (ex. Theater or meeting):1 As a passenger in a car for an hour without a break:0 Lying down to rest in the afternoon:3 Sitting and talking to someone:3 Sitting quietly after lunch (no alcohol):1 In a car, while stopped in traffic:0 Total:11   Objective:  Neurologic Exam  Physical Exam Physical Examination:   Filed Vitals:   07/22/15 1335  BP: 170/92  Pulse: 78  Resp: 18    General Examination: The patient is a very pleasant 60 y.o. male in no acute distress. He appears well-developed and well-nourished and well groomed.   HEENT: Normocephalic, atraumatic, pupils are equal, round and reactive to light and accommodation. Funduscopic exam is normal with sharp disc margins noted. Extraocular tracking is good without limitation to gaze excursion or nystagmus noted. Normal smooth pursuit is noted. Hearing is grossly intact. Tympanic membranes are clear bilaterally. Face is symmetric with normal facial animation and normal facial sensation. Speech is clear with no dysarthria noted. There is no hypophonia. There is no lip, neck/head, jaw or voice tremor. Neck is supple with full range of  passive and active motion. There are no carotid bruits on auscultation. Oropharynx exam reveals: moderate mouth dryness, adequate dental hygiene and moderate airway crowding, due to larger tongue, redundant soft  palate and larger appearing uvula. His tonsils are absent. Mallampati is class II. Neck circumference is 17-1/2 inches. Tongue protrudes centrally and palate elevates symmetrically.   Chest: Clear to auscultation without wheezing, rhonchi or crackles noted. He has a dialysis port in the right upper chest.  Heart: S1+S2+0, regular and normal without murmurs, rubs or gallops noted.   Abdomen: Soft, non-tender and non-distended with normal bowel sounds appreciated on auscultation.  Extremities: There is trace pitting edema in the distal lower extremities bilaterally. Pedal pulses are intact.  Skin: Warm and dry without trophic changes noted. There are no varicose veins.  Musculoskeletal: exam reveals no obvious joint deformities, tenderness or joint swelling or erythema. He has a AV fistula site in the left forearm by the wrist but a thrill is not palpable.   Neurologically:  Mental status: The patient is awake, alert and oriented in all 4 spheres. His immediate and remote memory, attention, language skills and fund of knowledge are appropriate. There is no evidence of aphasia, agnosia, apraxia or anomia. Speech is clear with normal prosody and enunciation. Thought process is linear. Mood is normal and affect is normal.  Cranial nerves II - XII are as described above under HEENT exam. In addition: shoulder shrug is normal with equal shoulder height noted. Motor exam: Normal bulk, strength and tone is noted. There is no drift, tremor or rebound. Romberg is negative, but he has to stand slightly wide-based. Reflexes are 1+ throughout. Fine motor skills and coordination: intact with normal finger taps, normal hand movements, normal rapid alternating patting, normal foot taps and normal foot  agility.  Cerebellar testing: No dysmetria or intention tremor on finger to nose testing. Heel to shin is unremarkable bilaterally. There is no truncal or gait ataxia.  Sensory exam: intact to light touch, pinprick, vibration, temperature sense in the upper and lower extremities.  Gait, station and balance: He stands with difficulty. No veering to one side is noted. He stands slightly wide-based. Tandem walk is not possible. Gait is slow and slightly cautious.  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 I had a long chat with the patient about my findings and the diagnosis of OSA, its prognosis and treatment options. We talked about medical treatments, surgical interventions and non-pharmacological approaches. I explained in particular the risks and ramifications of untreated moderate to severe OSA, especially with respect to developing cardiovascular disease down the Road, including congestive heart failure, difficult to treat hypertension, cardiac arrhythmias, or stroke. Even type 2 diabetes has, in part, been linked to untreated OSA. Symptoms of untreated OSA include daytime sleepiness, memory problems, mood irritability and mood disorder such as depression and anxiety, lack of energy, as well as recurrent  headaches, especially morning headaches. We talked about smoking cessation and trying to maintain a healthy lifestyle in general, as well as the importance of weight control. I encouraged the patient to eat healthy, exercise daily and keep well hydrated, to keep a  scheduled bedtime and wake time routine, to not skip any meals and eat healthy snacks in between meals. I advised the patient not to drive when feeling sleepy. I recommended the following at this time: sleep study with potential positive airway pressure titration. (We will score hypopneas at 4% and split the sleep study into diagnostic and treatment portion, if the estimated. 2 hour AHI is >15/h). We will schedule him when his Medicare approval is through. He would like to wait until after this. He anticipates that his Medicare will be approved in March. He will be in touch with [place] to schedule his sleep study.  I explained the sleep test procedure to the patient and also outlined possible surgical and non-surgical treatment options of OSA, including the use of a custom-made dental device (which would require a referral to a specialist dentist or oral surgeon), upper airway surgical options, such as pillar implants, radiofrequency surgery, tongue base surgery, and UPPP (which would involve a referral to an ENT surgeon). Rarely, jaw surgery such as mandibular advancement may be considered.  I also explained the CPAP treatment option to the patient, who indicated that he would be willing to try CPAP if the need arises. I explained the importance of being compliant with PAP treatment, not only for insurance purposes but primarily to improve His symptoms, and for the patient's long term health benefit, including to reduce His cardiovascular risks. I answered all his questions today and the patient was in agreement. I would like to see him back after the sleep study is completed and encouraged him to call with any interim questions, concerns, problems or  updates.   Thank you very much for allowing me to participate in the care of this nice patient. If I can be of any further assistance to you please do not hesitate to call me at [phone].  Sincerely,   Star Age, MD, PhD

## 2015-08-23 ENCOUNTER — Ambulatory Visit: Payer: Managed Care, Other (non HMO) | Admitting: Cardiovascular Disease

## 2015-08-24 ENCOUNTER — Ambulatory Visit (INDEPENDENT_AMBULATORY_CARE_PROVIDER_SITE_OTHER): Payer: Managed Care, Other (non HMO) | Admitting: Cardiovascular Disease

## 2015-08-24 ENCOUNTER — Encounter: Payer: Self-pay | Admitting: Cardiovascular Disease

## 2015-08-24 VITALS — BP 162/88 | HR 75 | Ht 72.0 in | Wt 273.0 lb

## 2015-08-24 DIAGNOSIS — N186 End stage renal disease: Secondary | ICD-10-CM

## 2015-08-24 DIAGNOSIS — I429 Cardiomyopathy, unspecified: Secondary | ICD-10-CM

## 2015-08-24 DIAGNOSIS — I5022 Chronic systolic (congestive) heart failure: Secondary | ICD-10-CM

## 2015-08-24 DIAGNOSIS — R001 Bradycardia, unspecified: Secondary | ICD-10-CM

## 2015-08-24 DIAGNOSIS — I25118 Atherosclerotic heart disease of native coronary artery with other forms of angina pectoris: Secondary | ICD-10-CM

## 2015-08-24 DIAGNOSIS — Z992 Dependence on renal dialysis: Secondary | ICD-10-CM

## 2015-08-24 DIAGNOSIS — I10 Essential (primary) hypertension: Secondary | ICD-10-CM

## 2015-08-24 DIAGNOSIS — E785 Hyperlipidemia, unspecified: Secondary | ICD-10-CM

## 2015-08-24 MED ORDER — HYDRALAZINE HCL 50 MG PO TABS: 75.0000 mg | ORAL_TABLET | Freq: Three times a day (TID) | ORAL | Status: DC

## 2015-08-24 NOTE — Patient Instructions (Signed)
   Increase Hydralazine to 75mg  three times per day - new sent to [facility] today. Continue all other medications.   Your physician wants you to follow up in: 6 months.  You will receive a reminder letter in the mail one-two months in advance.  If you don't receive a letter, please call our office to schedule the follow up appointment

## 2015-08-24 NOTE — Progress Notes (Signed)
Patient ID: Mark [name], male   DOB: 10/12/1955, 60 y.o.   MRN: [id number]      SUBJECTIVE: The patient presents for routine follow-up. Coronary angiography on 03/25/15 demonstrated a 95% stenosis in the proximal to mid LAD for which a drug-eluting stent was placed. He also had 100% stenosis of the proximal to distal RCA lesion area the RCA filled in the PDA region via collaterals from the LAD and PL via the circumflex. Long-term Brilinta was recommended with recommendations that aspirin could be stopped after 3 months.  The patient denies any symptoms of chest pain, palpitations, shortness of breath, lightheadedness, dizziness, leg swelling, orthopnea, PND, and syncope.  He is considering having a tooth pulled and having back surgery, but understands he could only temporarily come off Brilinta after it has been one year since stent placement.    Review of Systems: As per "subjective", otherwise negative.  No Known Allergies  Current Outpatient Prescriptions  Medication Sig Dispense Refill  . ALPRAZolam (XANAX) 1 MG tablet Take 1 mg by mouth 2 (two) times daily as needed for anxiety.     Marland Kitchen aspirin EC 81 MG tablet Take 81 mg by mouth daily.    Marland Kitchen atorvastatin (LIPITOR) 40 MG tablet Take 40 mg by mouth at bedtime.    . Calcium Carbonate (CALCIUM 600 PO) Take 2 tablets by mouth daily.    . calcium carbonate (TUMS - DOSED IN MG ELEMENTAL CALCIUM) 500 MG chewable tablet Chew 1 tablet by mouth daily as needed for indigestion or heartburn.    . cholecalciferol (VITAMIN D) 1000 UNITS tablet Take 1,000 Units by mouth daily.    Marland Kitchen glipiZIDE (GLUCOTROL XL) 5 MG 24 hr tablet Take 1 tablet (5 mg total) by mouth daily before breakfast. (Patient taking differently: Take 10 mg by mouth daily before breakfast. ) 30 tablet 0  . hydrALAZINE (APRESOLINE) 50 MG tablet Take 1 tablet (50 mg total) by mouth 3 (three) times daily. 90 tablet 6  . HYDROcodone-acetaminophen (NORCO) 10-325 MG per tablet Take 1 tablet  by mouth 2 (two) times daily as needed for severe pain.   0  . nitroGLYCERIN (NITROSTAT) 0.4 MG SL tablet Place 1 tablet (0.4 mg total) under the tongue every 5 (five) minutes as needed. 25 tablet 3  . ranitidine (ZANTAC) 150 MG tablet Take 150 mg by mouth daily as needed for heartburn.    . sodium bicarbonate 650 MG tablet Take 650 mg by mouth 3 (three) times daily.   4  . ticagrelor (BRILINTA) 90 MG TABS tablet Take 1 tablet (90 mg total) by mouth 2 (two) times daily. 60 tablet 11  . traZODone (DESYREL) 50 MG tablet Take 50 mg by mouth at bedtime as needed for sleep.      No current facility-administered medications for this visit.    Past Medical History  Diagnosis Date  . Back pain   . Hypertension   . Hypercholesteremia   . Anxiety     occas. panic attack, takes xanax occas  . Hematuria     being followed by Dr. Hinda Lenis for decreased kidney function   . Chronic kidney disease   . GERD (gastroesophageal reflux disease)     otc- pepcid , approx. every other  day    . Arthritis     herniated disc, lumbar   . Cancer ([employer])     - skin ca on face- removed   . Sleep apnea     test aborted, due to not able  to relax , since the aborted test he had surgery for gallbladder & he reports that he was told that he has sleep apnea   . Pneumonia     "walking"  . Diabetes mellitus without complication ([place])     type 2  . H/O acute respiratory failure   . History of blood product transfusion 02/13/2014    After having back surgery  . CHF (congestive heart failure) ([place])   . Shortness of breath dyspnea   . Complication of anesthesia     " it takes a long time to get over it."   . Fibromyalgia   . CAD (coronary artery disease)     03/25/2015 95% prox to mid LAD treated with Promus Premier DES 3.5x74mm postdilated to 4.68mm, 100% prox to distal RCA lesion filled in the PDA region via collaterals from LAD and PLA  . Myocardial infarction [facility])     Past Surgical History  Procedure Laterality  Date  . Cholecystectomy    . Tonsillectomy    . Eye surgery      cataracts remove, bilateral, w/IOL.  . Lumbar laminectomy/decompression microdiscectomy Right 02/13/2014    Procedure: RIGHT LUMBAR FOUR-FIVE, RIGHT LUMBAR FIVE- SACRAL ONE LUMBAR LAMINECTOMY/DECOMPRESSION MICRODISCECTOMY ;  Surgeon: Ashok Pall, MD;  Location: [facility];  Service: Neurosurgery;  Laterality: Right;  Right L4-5 and Right L5-S1 diskectomies  . Av fistula placement Left 02/02/2015    Procedure: LEFT ARM RADIOCEPHALIC ARTERIOVENOUS (AV) FISTULA CREATION;  Surgeon: Conrad [place]ers [place], MD;  Location: [place];  Service: Vascular;  Laterality: Left;  . Insertion of dialysis catheter N/A 03/09/2015    Procedure: INSERTION OF DIALYSIS CATHETER;  Surgeon: Conrad Goodrich, MD;  Location: [place];  Service: Vascular;  Laterality: N/A;  . Back surgery  02/13/14  . Colonoscopy    . Cardiac catheterization N/A 03/25/2015    Procedure: Left Heart Cath and Coronary Angiography;  Surgeon: Leonie Man, MD;  Location: [place] [facility];  Service: Cardiovascular;  Laterality: N/A;  . Cardiac catheterization N/A 03/25/2015    Procedure: Coronary Stent Intervention;  Surgeon: Leonie Man, MD;  Location: [place] [facility];  Service: Cardiovascular;  Laterality: N/A;    Social History   Social History  . Marital Status: Married    Spouse Name: N/A  . Number of Children: 2  . Years of Education: HS   Occupational History  . Not on file.   Social History Main Topics  . Smoking status: Former Smoker -- 0.50 packs/day for 40 years    Types: Cigarettes    Start date: 10/17/1973    Quit date: 02/07/2015  . Smokeless tobacco: Never Used  . Alcohol Use: No  . Drug Use: No  . Sexual Activity: Not on file   Other Topics Concern  . Not on file   Social History Narrative   Drinks 3 Diet sodas a day      Filed Vitals:   08/24/15 1314  BP: 162/88  Pulse: 75  Height: 6' (1.829 m)  Weight: 273 lb (123.832 kg)  SpO2: 97%     PHYSICAL EXAM General: NAD HEENT: Normal. Neck: No JVD, no thyromegaly. Lungs: Clear to auscultation bilaterally with normal respiratory effort. CV: Regular rate and rhythm, normal S1/S2, no [id number], 1/6 systolic murmur over RUSB. No pretibial or periankle edema.  Abdomen: Soft, obese, no distention.  Neurologic: Alert and oriented x 3.  Psych: Normal affect. Skin: Normal. Musculoskeletal: No gross deformities. Extremities: No clubbing or  cyanosis.   ECG: Most recent ECG reviewed.      ASSESSMENT AND PLAN: 1. Chronic systolic heart failure/cardiomyopathy, EF 35-40%: Did not tolerate long-acting nitrates. Unable to use beta blocker due to long history of bradycardia and unable to use ACE inhibitors or ARB's due to end-stage renal disease.  Continue hydralazine (increase to 75 mg TID) and Lasix 80 mg twice daily (co-managed by nephrology).  2. Hyperlipidemia: Lipids from 02/2015 preivously reviewed. Continue Lipitor 40 mg daily.  3. Essential HTN: Elevated today. Will increase hydralazine to 75 mg tid.  4. Bradycardia: HR currently normal.  5. ESRD: On hemodialysis.   6. CAD s/p LAD stent: Stable. Continue ASA 81 mg daily, Brilinta, and Lipitor. Unable to use beta blockers at present due to long history of bradycardia.   Dispo: f/u 6 months.   [name], M.D., F.A.C.C.

## 2016-02-10 ENCOUNTER — Telehealth: Payer: Self-pay | Admitting: Neurology

## 2016-02-10 DIAGNOSIS — N186 End stage renal disease: Secondary | ICD-10-CM

## 2016-02-10 DIAGNOSIS — Z992 Dependence on renal dialysis: Secondary | ICD-10-CM

## 2016-02-10 DIAGNOSIS — Z955 Presence of coronary angioplasty implant and graft: Secondary | ICD-10-CM

## 2016-02-10 DIAGNOSIS — G4733 Obstructive sleep apnea (adult) (pediatric): Secondary | ICD-10-CM

## 2016-02-10 DIAGNOSIS — E669 Obesity, unspecified: Secondary | ICD-10-CM

## 2016-02-10 DIAGNOSIS — I252 Old myocardial infarction: Secondary | ICD-10-CM

## 2016-02-10 NOTE — Telephone Encounter (Signed)
Patient called to schedule his sleep study and I can no longer see an order.  Would you please place another one for his sleep study?

## 2016-02-10 NOTE — Telephone Encounter (Signed)
Order has been placed (Providing that he has Medicare now?)

## 2016-03-07 ENCOUNTER — Encounter: Payer: Self-pay | Admitting: *Deleted

## 2016-03-07 ENCOUNTER — Ambulatory Visit (INDEPENDENT_AMBULATORY_CARE_PROVIDER_SITE_OTHER): Payer: Managed Care, Other (non HMO) | Admitting: Cardiovascular Disease

## 2016-03-07 VITALS — BP 162/90 | HR 61 | Ht 72.0 in | Wt 282.0 lb

## 2016-03-07 DIAGNOSIS — Z992 Dependence on renal dialysis: Secondary | ICD-10-CM

## 2016-03-07 DIAGNOSIS — I5022 Chronic systolic (congestive) heart failure: Secondary | ICD-10-CM

## 2016-03-07 DIAGNOSIS — N184 Chronic kidney disease, stage 4 (severe): Secondary | ICD-10-CM

## 2016-03-07 DIAGNOSIS — I25718 Atherosclerosis of autologous vein coronary artery bypass graft(s) with other forms of angina pectoris: Secondary | ICD-10-CM

## 2016-03-07 DIAGNOSIS — R001 Bradycardia, unspecified: Secondary | ICD-10-CM

## 2016-03-07 DIAGNOSIS — Z955 Presence of coronary angioplasty implant and graft: Secondary | ICD-10-CM

## 2016-03-07 DIAGNOSIS — N186 End stage renal disease: Secondary | ICD-10-CM | POA: Diagnosis not present

## 2016-03-07 DIAGNOSIS — I255 Ischemic cardiomyopathy: Secondary | ICD-10-CM

## 2016-03-07 DIAGNOSIS — E7800 Pure hypercholesterolemia, unspecified: Secondary | ICD-10-CM

## 2016-03-07 DIAGNOSIS — I209 Angina pectoris, unspecified: Secondary | ICD-10-CM

## 2016-03-07 DIAGNOSIS — I10 Essential (primary) hypertension: Secondary | ICD-10-CM

## 2016-03-07 NOTE — Patient Instructions (Addendum)
Medication Instructions:   Please try to take the Hydralazine three x per day as prescribed.   Continue all other medications.    Labwork: none  Testing/Procedures: none  Follow-Up: Your physician wants you to follow up in: 6 months.  You will receive a reminder letter in the mail one-two months in advance.  If you don't receive a letter, please call our office to schedule the follow up appointment   Any Other Special Instructions Will Be Listed Below (If Applicable).  If you need a refill on your cardiac medications before your next appointment, please call your pharmacy.

## 2016-03-07 NOTE — Progress Notes (Signed)
SUBJECTIVE: The patient presents for routine follow-up. Coronary angiography on 03/25/15 demonstrated a 95% stenosis in the proximal to mid LAD for which a drug-eluting stent was placed. He also had 100% stenosis of the proximal to distal RCA lesion area the RCA filled in the PDA region via collaterals from the LAD and PL via the circumflex. Long-term Brilinta was recommended.  The patient denies any symptoms of chest pain, palpitations, shortness of breath, lightheadedness, dizziness, leg swelling, orthopnea, PND, and syncope.  Wants to have back surgery at [facility] and fistula placement for hemodialysis.   Review of Systems: As per "subjective", otherwise negative.  No Known Allergies  Current Outpatient Prescriptions  Medication Sig Dispense Refill  . ALPRAZolam (XANAX) 1 MG tablet Take 1 mg by mouth 2 (two) times daily as needed for anxiety.     Marland Kitchen aspirin EC 81 MG tablet Take 81 mg by mouth daily.    Marland Kitchen atorvastatin (LIPITOR) 40 MG tablet Take 40 mg by mouth at bedtime.    . Calcium Carbonate (CALCIUM 600 PO) Take 2 tablets by mouth daily.    . calcium carbonate (TUMS - DOSED IN MG ELEMENTAL CALCIUM) 500 MG chewable tablet Chew 1 tablet by mouth daily as needed for indigestion or heartburn.    . cholecalciferol (VITAMIN D) 1000 UNITS tablet Take 1,000 Units by mouth daily.    . furosemide (LASIX) 40 MG tablet Take 80 mg by mouth 2 (two) times daily.    Marland Kitchen glipiZIDE (GLUCOTROL XL) 5 MG 24 hr tablet Take 1 tablet (5 mg total) by mouth daily before breakfast. (Patient taking differently: Take 10 mg by mouth daily before breakfast. ) 30 tablet 0  . hydrALAZINE (APRESOLINE) 50 MG tablet Take 1.5 tablets (75 mg total) by mouth 3 (three) times daily. 135 tablet 6  . HYDROcodone-acetaminophen (NORCO) 10-325 MG per tablet Take 1 tablet by mouth 2 (two) times daily as needed for severe pain.   0  . multivitamin (RENA-VIT) TABS tablet Take 1 tablet by mouth daily.    . nitroGLYCERIN (NITROSTAT)  0.4 MG SL tablet Place 1 tablet (0.4 mg total) under the tongue every 5 (five) minutes as needed. 25 tablet 3  . PREDNISONE PO Take 20 mg by mouth.    . ranitidine (ZANTAC) 150 MG tablet Take 150 mg by mouth daily as needed for heartburn.    . sodium bicarbonate 650 MG tablet Take 650 mg by mouth 3 (three) times daily.   4  . ticagrelor (BRILINTA) 90 MG TABS tablet Take 1 tablet (90 mg total) by mouth 2 (two) times daily. 60 tablet 11  . traZODone (DESYREL) 50 MG tablet Take 50 mg by mouth at bedtime as needed for sleep.     Marland Kitchen UNKNOWN TO PATIENT IV ANTIBIOTICS 3 TIMES WEEKLY WITH DIALYSIS     No current facility-administered medications for this visit.     Past Medical History:  Diagnosis Date  . Anxiety    occas. panic attack, takes xanax occas  . Arthritis    herniated disc, lumbar   . Back pain   . CAD (coronary artery disease)    03/25/2015 95% prox to mid LAD treated with Promus Premier DES 3.5x81mm postdilated to 4.34mm, 100% prox to distal RCA lesion filled in the PDA region via collaterals from LAD and PLA  . Cancer ([place])    - skin ca on face- removed   . CHF (congestive heart failure) ([place])   . Chronic kidney disease   .  Complication of anesthesia    " it takes a long time to get over it."   . Diabetes mellitus without complication ([place])    type 2  . Fibromyalgia   . GERD (gastroesophageal reflux disease)    otc- pepcid , approx. every other  day    . H/O acute respiratory failure   . Hematuria    being followed by Dr. Hinda Lenis for decreased kidney function   . History of blood product transfusion 02/13/2014   After having back surgery  . Hypercholesteremia   . Hypertension   . Myocardial infarction   . Pneumonia    "walking"  . Shortness of breath dyspnea   . Sleep apnea    test aborted, due to not able to relax , since the aborted test he had surgery for gallbladder & he reports that he was told that he has sleep apnea     Past Surgical History:  Procedure  Laterality Date  . AV FISTULA PLACEMENT Left 02/02/2015   Procedure: LEFT ARM RADIOCEPHALIC ARTERIOVENOUS (AV) FISTULA CREATION;  Surgeon: Conrad Crestline, MD;  Location: [facility];  Service: Vascular;  Laterality: Left;  . BACK SURGERY  02/13/14  . CARDIAC CATHETERIZATION N/A 03/25/2015   Procedure: Left Heart Cath and Coronary Angiography;  Surgeon: Leonie Man, MD;  Location: [place] CV [facility];  Service: Cardiovascular;  Laterality: N/A;  . CARDIAC CATHETERIZATION N/A 03/25/2015   Procedure: Coronary Stent Intervention;  Surgeon: Leonie Man, MD;  Location: [place] [facility];  Service: Cardiovascular;  Laterality: N/A;  . CHOLECYSTECTOMY    . COLONOSCOPY    . EYE SURGERY     cataracts remove, bilateral, w/IOL.  Marland Kitchen INSERTION OF DIALYSIS CATHETER N/A 03/09/2015   Procedure: INSERTION OF DIALYSIS CATHETER;  Surgeon: Conrad Palo Verde, MD;  Location: [place];  Service: Vascular;  Laterality: N/A;  . LUMBAR LAMINECTOMY/DECOMPRESSION MICRODISCECTOMY Right 02/13/2014   Procedure: RIGHT LUMBAR FOUR-FIVE, RIGHT LUMBAR FIVE- SACRAL ONE LUMBAR LAMINECTOMY/DECOMPRESSION MICRODISCECTOMY ;  Surgeon: Ashok Pall, MD;  Location: [facility];  Service: Neurosurgery;  Laterality: Right;  Right L4-5 and Right L5-S1 diskectomies  . TONSILLECTOMY      Social History   Social History  . Marital status: Married    Spouse name: N/A  . Number of children: 2  . Years of education: HS   Occupational History  . Not on file.   Social History Main Topics  . Smoking status: Former Smoker    Packs/day: 0.50    Years: 40.00    Types: Cigarettes    Start date: 10/17/1973    Quit date: 02/07/2015  . Smokeless tobacco: Never Used  . Alcohol use No  . Drug use: No  . Sexual activity: Not on file   Other Topics Concern  . Not on file   Social History Narrative   Drinks 3 Diet sodas a day      Vitals:   03/07/16 1303  BP: (!) 162/90  Pulse: 61  SpO2: 97%  Weight: 282 lb (127.9 kg)  Height: 6' (1.829 m)     PHYSICAL EXAM HEENT: Normal. Neck: No JVD, no thyromegaly. Lungs: Clear to auscultation bilaterally with normal respiratory effort. CV: Regular rate and rhythm, normal S1/S2, no X3/K4, 1/6 systolic murmur over RUSB. No pretibial or periankle edema.  Abdomen: Soft, obese, no distention.  Neurologic: Alert and oriented x 3.  Psych: Normal affect. Skin: Normal. Musculoskeletal: No gross deformities. Extremities: No clubbing or cyanosis.  ECG: Most recent ECG reviewed.      ASSESSMENT AND PLAN: 1. Chronic systolic heart failure/cardiomyopathy, EF 35-40%: Did not tolerate long-acting nitrates. Unable to use beta blocker due to long history of bradycardia and unable to use ACE inhibitors or ARB's due to end-stage renal disease.  Continue hydralazine (had been taking 75 mg BID, encouraged to take it TID) and Lasix 80 mg twice daily (co-managed by nephrology).  2. Hyperlipidemia: Lipids from 02/2015 preivously reviewed. Continue Lipitor 40 mg daily.  3. Essential HTN: Elevated today. Had been taking hydralazine 75 mg BID, encouraged to take it TID.  4. Bradycardia: HR currently normal.  5. ESRD: On hemodialysis.   6. CAD s/p LAD stent: Stable. Continue ASA 81 mg daily, Brilinta, and Lipitor. Unable to use beta blockers at present due to long history of bradycardia.   Dispo: f/u 6 months.    [name], M.D., F.A.C.C.

## 2016-03-31 ENCOUNTER — Emergency Department (HOSPITAL_COMMUNITY): Payer: Managed Care, Other (non HMO)

## 2016-03-31 ENCOUNTER — Encounter (HOSPITAL_COMMUNITY): Payer: Self-pay | Admitting: Emergency Medicine

## 2016-03-31 ENCOUNTER — Emergency Department (HOSPITAL_COMMUNITY)
Admission: EM | Admit: 2016-03-31 | Discharge: 2016-04-01 | Disposition: A | Discharge status: Home / Self Care | Payer: Managed Care, Other (non HMO) | Attending: Emergency Medicine | Admitting: Emergency Medicine

## 2016-03-31 DIAGNOSIS — I120 Hypertensive chronic kidney disease with stage 5 chronic kidney disease or end stage renal disease: Secondary | ICD-10-CM | POA: Insufficient documentation

## 2016-03-31 DIAGNOSIS — Z87891 Personal history of nicotine dependence: Secondary | ICD-10-CM | POA: Insufficient documentation

## 2016-03-31 DIAGNOSIS — R0600 Dyspnea, unspecified: Secondary | ICD-10-CM | POA: Insufficient documentation

## 2016-03-31 DIAGNOSIS — I252 Old myocardial infarction: Secondary | ICD-10-CM | POA: Insufficient documentation

## 2016-03-31 DIAGNOSIS — E1122 Type 2 diabetes mellitus with diabetic chronic kidney disease: Secondary | ICD-10-CM | POA: Insufficient documentation

## 2016-03-31 DIAGNOSIS — Z7984 Long term (current) use of oral hypoglycemic drugs: Secondary | ICD-10-CM | POA: Diagnosis not present

## 2016-03-31 DIAGNOSIS — N186 End stage renal disease: Secondary | ICD-10-CM | POA: Insufficient documentation

## 2016-03-31 DIAGNOSIS — Z992 Dependence on renal dialysis: Secondary | ICD-10-CM | POA: Insufficient documentation

## 2016-03-31 DIAGNOSIS — I5022 Chronic systolic (congestive) heart failure: Secondary | ICD-10-CM | POA: Diagnosis not present

## 2016-03-31 DIAGNOSIS — Z85820 Personal history of malignant melanoma of skin: Secondary | ICD-10-CM | POA: Insufficient documentation

## 2016-03-31 DIAGNOSIS — N189 Chronic kidney disease, unspecified: Secondary | ICD-10-CM

## 2016-03-31 DIAGNOSIS — I2510 Atherosclerotic heart disease of native coronary artery without angina pectoris: Secondary | ICD-10-CM | POA: Diagnosis not present

## 2016-03-31 DIAGNOSIS — Z7982 Long term (current) use of aspirin: Secondary | ICD-10-CM | POA: Diagnosis not present

## 2016-03-31 DIAGNOSIS — R0602 Shortness of breath: Secondary | ICD-10-CM | POA: Diagnosis present

## 2016-03-31 LAB — COMPREHENSIVE METABOLIC PANEL
ALT: 25 U/L (ref 17–63)
AST: 27 U/L (ref 15–41)
Albumin: 3.5 g/dL (ref 3.5–5.0)
Alkaline Phosphatase: 57 U/L (ref 38–126)
Anion gap: 11 (ref 5–15)
BILIRUBIN TOTAL: 0.7 mg/dL (ref 0.3–1.2)
BUN: 19 mg/dL (ref 6–20)
CALCIUM: 8.6 mg/dL — AB (ref 8.9–10.3)
CHLORIDE: 102 mmol/L (ref 101–111)
CO2: 26 mmol/L (ref 22–32)
CREATININE: 3.94 mg/dL — AB (ref 0.61–1.24)
GFR, EST AFRICAN AMERICAN: 18 mL/min — AB (ref >60)
GFR, EST NON AFRICAN AMERICAN: 15 mL/min — AB (ref >60)
Glucose, Bld: 143 mg/dL — ABNORMAL HIGH (ref 65–99)
Potassium: 4.2 mmol/L (ref 3.5–5.1)
Sodium: 139 mmol/L (ref 135–145)
TOTAL PROTEIN: 6.5 g/dL (ref 6.5–8.1)

## 2016-03-31 LAB — CBC WITH DIFFERENTIAL/PLATELET
Basophils Absolute: 0 10*3/uL (ref 0.0–0.1)
Basophils Relative: 0 %
EOS PCT: 3 %
Eosinophils Absolute: 0.2 10*3/uL (ref 0.0–0.7)
HEMATOCRIT: 30.9 % — AB (ref 39.0–52.0)
Hemoglobin: 9.6 g/dL — ABNORMAL LOW (ref 13.0–17.0)
LYMPHS ABS: 0.4 10*3/uL — AB (ref 0.7–4.0)
LYMPHS PCT: 5 %
MCH: 29.9 pg (ref 26.0–34.0)
MCHC: 31.1 g/dL (ref 30.0–36.0)
MCV: 96.3 fL (ref 78.0–100.0)
MONO ABS: 0.7 10*3/uL (ref 0.1–1.0)
Monocytes Relative: 9 %
Neutro Abs: 5.8 10*3/uL (ref 1.7–7.7)
Neutrophils Relative %: 83 %
PLATELETS: 157 10*3/uL (ref 150–400)
RBC: 3.21 MIL/uL — AB (ref 4.22–5.81)
RDW: 15.6 % — ABNORMAL HIGH (ref 11.5–15.5)
WBC: 7 10*3/uL (ref 4.0–10.5)

## 2016-03-31 LAB — I-STAT TROPONIN, ED: TROPONIN I, POC: 0.06 ng/mL (ref 0.00–0.08)

## 2016-03-31 MED ORDER — FUROSEMIDE 10 MG/ML IJ SOLN
80.0000 mg | Freq: Once | INTRAMUSCULAR | Status: AC
  Administered 2016-03-31: 80 mg via INTRAVENOUS
  Filled 2016-03-31: qty 8

## 2016-03-31 MED ORDER — NITROGLYCERIN 2 % TD OINT
1.0000 [in_us] | TOPICAL_OINTMENT | Freq: Once | TRANSDERMAL | Status: AC
  Administered 2016-03-31: 1 [in_us] via TOPICAL
  Filled 2016-03-31: qty 1

## 2016-03-31 NOTE — ED Provider Notes (Signed)
[facility] DEPT Provider Note   CSN: [id number] Arrival date & time: 03/31/16  1850   By signing my name below, I, Mark Deed. Mark [name], attest that this documentation has been prepared under the direction and in the presence of Mark Fraise, MD.  Electronically Signed: Maud Deed. Mark [name], ED Scribe. 03/31/16. 12:17 AM.    History   Chief Complaint Chief Complaint  Patient presents with  . Somnolence  . Shortness of Breath  . Fatigue   The history is provided by the patient. No language interpreter was used.  Shortness of Breath  This is a recurrent problem. The average episode lasts 4 days. The problem occurs intermittently.The current episode started more than 2 days ago. The problem has not changed since onset.Associated symptoms include cough. Pertinent negatives include no fever, no headaches, no chest pain and no vomiting. He has tried nothing for the symptoms. Associated medical issues include CAD, heart failure and past MI. Associated medical issues do not include asthma or COPD.    HPI Comments: Mark [name] is a 60 y.o. male with a PMHx of CAD, CHF, DM, CHF, sleep apnea, HTN who presents to the Emergency Department complaining of constant, unchanged fatigue and inability to sleep x several days. Pt states he has only been able to sleep for a few hours at a time. He also reports intermittent shortness of breath and cough. Pt feels symptoms may be associated to his history of sleep apnea. Pt states he often wakes in the middle of the night short of breath with trouble falling back asleep. One dose of Hydralazine attempted prior to evaluate. However, no additional  OTC/prescribed medications or treatments attempted at home. No recent fever, chills, or chest pain. Pt was recently evaluated at a near by Urgent Care for similar symptoms. Pt states he was given some Oxygen which did improve some symptoms temporarily. Mr. Mark [name] undergoes hemodialysis on Mondays, Wednesdays, and Fridays.  No issues reported with most recent treatment. However, pt did cut his treatment 30 minutes short prior to stop time.  PCP: Mark Lair, MD    Past Medical History:  Diagnosis Date  . Anxiety    occas. panic attack, takes xanax occas  . Arthritis    herniated disc, lumbar   . Back pain   . CAD (coronary artery disease)    03/25/2015 95% prox to mid LAD treated with Promus Premier DES 3.5x88mm postdilated to 4.30mm, 100% prox to distal RCA lesion filled in the PDA region via collaterals from LAD and PLA  . Cancer ([place])    - skin ca on face- removed   . CHF (congestive heart failure) ([place])   . Chronic kidney disease   . Complication of anesthesia    " it takes a long time to get over it."   . Diabetes mellitus without complication ([place])    type 2  . Fibromyalgia   . GERD (gastroesophageal reflux disease)    otc- pepcid , approx. every other  day    . H/O acute respiratory failure   . Hematuria    being followed by Dr. Hinda [name] for decreased kidney function   . History of blood product transfusion 02/13/2014   After having back surgery  . Hypercholesteremia   . Hypertension   . Myocardial infarction   . Pneumonia    "walking"  . Shortness of breath dyspnea   . Sleep apnea    test aborted, due to not able to relax , since the aborted test he had  surgery for gallbladder & he reports that he was told that he has sleep apnea     Patient Active Problem List   Diagnosis Date Noted  . Abnormal stress test 03/25/2015  . Abnormal nuclear stress test   . Cardiomyopathy ([place])   . Chronic systolic heart failure ([place])   . ESRD (end stage renal disease) on dialysis ([place])   . Pre-operative cardiovascular examination   . Positive cardiac stress test 03/24/2015  . Essential hypertension   . Acute respiratory failure ([place]) 02/12/2015  . Renal failure   . Hyperkalemia   . Chronic kidney disease (CKD), stage IV (severe) ([place]) 01/22/2015  . HNP (herniated nucleus pulposus), lumbar  02/13/2014  . Abnormal ECG 02/12/2014  . HTN (hypertension) 02/12/2014  . Bradycardia 02/12/2014    Past Surgical History:  Procedure Laterality Date  . AV FISTULA PLACEMENT Left 02/02/2015   Procedure: LEFT ARM RADIOCEPHALIC ARTERIOVENOUS (AV) FISTULA CREATION;  Surgeon: Mark Abbeville, MD;  Location: [facility];  Service: Vascular;  Laterality: Left;  . BACK SURGERY  02/13/14  . CARDIAC CATHETERIZATION N/A 03/25/2015   Procedure: Left Heart Cath and Coronary Angiography;  Surgeon: Mark Man, MD;  Location: [facility];  Service: Cardiovascular;  Laterality: N/A;  . CARDIAC CATHETERIZATION N/A 03/25/2015   Procedure: Coronary Stent Intervention;  Surgeon: Mark Man, MD;  Location: [facility];  Service: Cardiovascular;  Laterality: N/A;  . CHOLECYSTECTOMY    . COLONOSCOPY    . EYE SURGERY     cataracts remove, bilateral, w/IOL.  Marland Kitchen INSERTION OF DIALYSIS CATHETER N/A 03/09/2015   Procedure: INSERTION OF DIALYSIS CATHETER;  Surgeon: Mark Talbotton, MD;  Location: [facility];  Service: Vascular;  Laterality: N/A;  . LUMBAR LAMINECTOMY/DECOMPRESSION MICRODISCECTOMY Right 02/13/2014   Procedure: RIGHT LUMBAR FOUR-FIVE, RIGHT LUMBAR FIVE- SACRAL ONE LUMBAR LAMINECTOMY/DECOMPRESSION MICRODISCECTOMY ;  Surgeon: Mark Pall, MD;  Location: [facility];  Service: Neurosurgery;  Laterality: Right;  Right L4-5 and Right L5-S1 diskectomies  . TONSILLECTOMY         Home Medications    Prior to Admission medications   Medication Sig Start Date End Date Taking? Authorizing Provider  ALPRAZolam Duanne Moron) 1 MG tablet Take 1 mg by mouth 2 (two) times daily as needed for anxiety.  12/21/14   Historical Provider, MD  aspirin EC 81 MG tablet Take 81 mg by mouth daily.    Historical Provider, MD  atorvastatin (LIPITOR) 40 MG tablet Take 40 mg by mouth at bedtime.    Historical Provider, MD  Calcium Carbonate (CALCIUM 600 PO) Take 2 tablets by mouth daily.    Historical Provider, MD  calcium  carbonate (TUMS - DOSED IN MG ELEMENTAL CALCIUM) 500 MG chewable tablet Chew 1 tablet by mouth daily as needed for indigestion or heartburn.    Historical Provider, MD  cholecalciferol (VITAMIN D) 1000 UNITS tablet Take 1,000 Units by mouth daily.    Historical Provider, MD  furosemide (LASIX) 40 MG tablet Take 80 mg by mouth 2 (two) times daily.    Historical Provider, MD  glipiZIDE (GLUCOTROL XL) 5 MG 24 hr tablet Take 1 tablet (5 mg total) by mouth daily before breakfast. Patient taking differently: Take 10 mg by mouth daily before breakfast.  02/15/15   Thurnell Lose, MD  hydrALAZINE (APRESOLINE) 50 MG tablet Take 1.5 tablets (75 mg total) by mouth 3 (three) times daily. 08/24/15   Herminio Commons, MD  HYDROcodone-acetaminophen (NORCO) 10-325 MG per tablet Take  1 tablet by mouth 2 (two) times daily as needed for severe pain.  02/10/15   Historical Provider, MD  multivitamin (RENA-VIT) TABS tablet Take 1 tablet by mouth daily.    Historical Provider, MD  nitroGLYCERIN (NITROSTAT) 0.4 MG SL tablet Place 1 tablet (0.4 mg total) under the tongue every 5 (five) minutes as needed. 03/23/15   Herminio Commons, MD  PREDNISONE PO Take 20 mg by mouth.    Historical Provider, MD  ranitidine (ZANTAC) 150 MG tablet Take 150 mg by mouth daily as needed for heartburn.    Historical Provider, MD  sodium bicarbonate 650 MG tablet Take 650 mg by mouth 3 (three) times daily.  01/09/15   Historical Provider, MD  ticagrelor (BRILINTA) 90 MG TABS tablet Take 1 tablet (90 mg total) by mouth 2 (two) times daily. 03/26/15   Almyra Deforest, PA  traZODone (DESYREL) 50 MG tablet Take 50 mg by mouth at bedtime as needed for sleep.     Historical Provider, MD  UNKNOWN TO PATIENT IV ANTIBIOTICS 3 TIMES WEEKLY WITH DIALYSIS    Historical Provider, MD    Family History Family History  Problem Relation Age of Onset  . Adopted: Yes  . Prostate cancer Father   . Cancer Father     prostate  . Heart failure Maternal Grandmother    . Heart attack Maternal Grandfather   . Colon cancer Maternal Grandfather     Social History Social History  Substance Use Topics  . Smoking status: Former Smoker    Packs/day: 0.50    Years: 40.00    Types: Cigarettes    Start date: 10/17/1973    Quit date: 02/07/2015  . Smokeless tobacco: Never Used  . Alcohol use No     Allergies   Review of patient's allergies indicates no known allergies.   Review of Systems Review of Systems  Constitutional: Negative for chills and fever.  Respiratory: Positive for cough and shortness of breath.   Cardiovascular: Negative for chest pain.  Gastrointestinal: Negative for nausea and vomiting.  Neurological: Negative for headaches.  Psychiatric/Behavioral: Positive for sleep disturbance. Negative for confusion.  All other systems reviewed and are negative.    Physical Exam Updated Vital Signs BP 144/62   Pulse 65   Temp 98.2 F (36.8 C) (Oral)   Resp 16   SpO2 90%   Physical Exam  CONSTITUTIONAL: Well developed/well nourished, no distress HEAD: Normocephalic/atraumatic EYES: EOMI/PERRL ENMT: Mucous membranes moist NECK: supple no meningeal signs SPINE/BACK:entire spine nontender CV: S1/S2 noted, no murmurs/rubs/gallops noted LUNGS: Crackles in bilateral bases ABDOMEN: soft, nontender, no rebound or guarding, bowel sounds noted throughout abdomen GU:no cva tenderness NEURO: Pt is awake/alert/appropriate, moves all extremitiesx4.  No facial droop.   EXTREMITIES: pulses normal/equal, full ROM. No lower extremity edema  SKIN: warm, color normal. dialysis catheter to R upper chest PSYCH: no abnormalities of mood noted, alert and oriented to situation   ED Treatments / Results   DIAGNOSTIC STUDIES: Oxygen Saturation is 98% on RA, Normal by my interpretation.    COORDINATION OF CARE: 11:22 PM- Will order blood work, EKG, and CXR. Will give Nitro and Lasix. Discussed treatment plan with pt at bedside and pt agreed to plan.      Labs (all labs ordered are listed, but only abnormal results are displayed) Labs Reviewed  CBC WITH DIFFERENTIAL/PLATELET - Abnormal; Notable for the following:       Result Value   RBC 3.21 (*)    Hemoglobin 9.6 (*)  HCT 30.9 (*)    RDW 15.6 (*)    Lymphs Abs 0.4 (*)    All other components within normal limits  COMPREHENSIVE METABOLIC PANEL - Abnormal; Notable for the following:    Glucose, Bld 143 (*)    Creatinine, Ser 3.94 (*)    Calcium 8.6 (*)    GFR calc non Af Amer 15 (*)    GFR calc Af Amer 18 (*)    All other components within normal limits  I-STAT TROPOININ, ED    EKG  EKG Interpretation  Date/Time:  Friday March 31 2016 18:55:50 EDT Ventricular Rate:  74 PR Interval:  174 QRS Duration: 112 QT Interval:  442 QTC Calculation: 490 R Axis:   85 Text Interpretation:  Normal sinus rhythm ST & T wave abnormality, consider lateral ischemia Prolonged QT Abnormal ECG Confirmed by Christy Gentles  MD, Virginio Isidore ([id number]) on 03/31/2016 11:09:30 PM       EKG Interpretation  Date/Time:  Saturday April 01 2016 01:48:58 EDT Ventricular Rate:  69 PR Interval:  174 QRS Duration: 113 QT Interval:  449 QTC Calculation: 481 R Axis:   13 Text Interpretation:  Sinus rhythm Borderline intraventricular conduction delay T wave abnormality Confirmed by Christy Gentles  MD, [place] ([id number]) on 04/01/2016 1:58:27 AM       Radiology Dg Chest 2 View  Result Date: 03/31/2016 CLINICAL DATA:  Exertional dyspnea and dry cough EXAM: CHEST  2 VIEW COMPARISON:  03/09/2015 FINDINGS: Right-sided vascular catheter with tips overlying the SVC. Mild cardiomegaly. Tiny bilateral effusions. Mild central vascular congestion. Perihilar interstitial opacities likely reflect mild edema. No pneumothorax. IMPRESSION: 1. Mild cardiomegaly with central vascular congestion and mild perihilar interstitial opacities suggestive of edema. Tiny bilateral effusions. Electronically Signed   By: Donavan Foil M.D.   On:  03/31/2016 20:34    Procedures Procedures (including critical care time)  Medications Ordered in ED Medications  nitroGLYCERIN (NITROGLYN) 2 % ointment 1 inch (1 inch Topical Given 03/31/16 2357)  furosemide (LASIX) injection 80 mg (80 mg Intravenous Given 03/31/16 2357)     Initial Impression / Assessment and Plan / ED Course  I have reviewed the triage vital signs and the nursing notes.  Pertinent labs & imaging results that were available during my care of the patient were reviewed by me and considered in my medical decision making (see chart for details).  Clinical Course    Pt mostly concerned for fatigue and waking up with dyspnea.  He reports lack of sleep We agreed to give NTG/lasix (he takes these at home) He had some urine output in the ED He ambulated and reports feeling well No CP reported He has abnormal EKG though repeat ekg showed no acute change from prior I doubt ACS at this time, he has no chest pain I doubt PE at this time  He is anxious to go home and reports feeling well Advised to continue home meds including lasix We discussed strict ER return precautions   Final Clinical Impressions(s) / ED Diagnoses   Final diagnoses:  Chronic renal failure, unspecified CKD stage  Dyspnea, unspecified type  Chronic systolic congestive heart failure ([place])    New Prescriptions New Prescriptions   No medications on file  I personally performed the services described in this documentation, which was scribed in my presence. The recorded information has been reviewed and is accurate.        Mark Fraise, MD 04/01/16 [phone]

## 2016-03-31 NOTE — ED Triage Notes (Addendum)
Pt. reports feeling sleepy most of the time for the past several days , exertional dyspnea with occasional dry cough worse when lying and fatigue , denies fever or chills. Hemodialysis treatment q Mon / Wed / Fri.

## 2016-04-01 NOTE — ED Notes (Signed)
Ambulated pt in the hallway sats remained at 92% no complaints noted at this time

## 2016-04-03 ENCOUNTER — Emergency Department (HOSPITAL_COMMUNITY)
Admission: EM | Admit: 2016-04-03 | Discharge: 2016-04-03 | Disposition: A | Discharge status: Home / Self Care | Payer: Managed Care, Other (non HMO) | Attending: Emergency Medicine | Admitting: Emergency Medicine

## 2016-04-03 ENCOUNTER — Encounter (HOSPITAL_COMMUNITY): Payer: Self-pay | Admitting: Emergency Medicine

## 2016-04-03 DIAGNOSIS — I2510 Atherosclerotic heart disease of native coronary artery without angina pectoris: Secondary | ICD-10-CM | POA: Diagnosis not present

## 2016-04-03 DIAGNOSIS — R7881 Bacteremia: Secondary | ICD-10-CM

## 2016-04-03 DIAGNOSIS — Z7984 Long term (current) use of oral hypoglycemic drugs: Secondary | ICD-10-CM | POA: Insufficient documentation

## 2016-04-03 DIAGNOSIS — I5022 Chronic systolic (congestive) heart failure: Secondary | ICD-10-CM | POA: Diagnosis not present

## 2016-04-03 DIAGNOSIS — N184 Chronic kidney disease, stage 4 (severe): Secondary | ICD-10-CM | POA: Insufficient documentation

## 2016-04-03 DIAGNOSIS — Z7982 Long term (current) use of aspirin: Secondary | ICD-10-CM | POA: Diagnosis not present

## 2016-04-03 DIAGNOSIS — Z87891 Personal history of nicotine dependence: Secondary | ICD-10-CM | POA: Diagnosis not present

## 2016-04-03 DIAGNOSIS — Z79899 Other long term (current) drug therapy: Secondary | ICD-10-CM | POA: Diagnosis not present

## 2016-04-03 DIAGNOSIS — I130 Hypertensive heart and chronic kidney disease with heart failure and stage 1 through stage 4 chronic kidney disease, or unspecified chronic kidney disease: Secondary | ICD-10-CM | POA: Diagnosis not present

## 2016-04-03 DIAGNOSIS — E1122 Type 2 diabetes mellitus with diabetic chronic kidney disease: Secondary | ICD-10-CM | POA: Insufficient documentation

## 2016-04-03 LAB — CBC WITH DIFFERENTIAL/PLATELET
Basophils Absolute: 0 10*3/uL (ref 0.0–0.1)
Basophils Relative: 0 %
Eosinophils Absolute: 0.2 10*3/uL (ref 0.0–0.7)
Eosinophils Relative: 3 %
HCT: 29.7 % — ABNORMAL LOW (ref 39.0–52.0)
Hemoglobin: 9.5 g/dL — ABNORMAL LOW (ref 13.0–17.0)
Lymphocytes Relative: 7 %
Lymphs Abs: 0.4 10*3/uL — ABNORMAL LOW (ref 0.7–4.0)
MCH: 30.9 pg (ref 26.0–34.0)
MCHC: 32 g/dL (ref 30.0–36.0)
MCV: 96.7 fL (ref 78.0–100.0)
Monocytes Absolute: 0.4 10*3/uL (ref 0.1–1.0)
Monocytes Relative: 7 %
Neutro Abs: 5.3 10*3/uL (ref 1.7–7.7)
Neutrophils Relative %: 83 %
Platelets: 175 10*3/uL (ref 150–400)
RBC: 3.07 MIL/uL — ABNORMAL LOW (ref 4.22–5.81)
RDW: 15.5 % (ref 11.5–15.5)
WBC: 6.4 10*3/uL (ref 4.0–10.5)

## 2016-04-03 LAB — BASIC METABOLIC PANEL
Anion gap: 8 (ref 5–15)
BUN: 22 mg/dL — ABNORMAL HIGH (ref 6–20)
CO2: 27 mmol/L (ref 22–32)
Calcium: 8.3 mg/dL — ABNORMAL LOW (ref 8.9–10.3)
Chloride: 100 mmol/L — ABNORMAL LOW (ref 101–111)
Creatinine, Ser: 3.8 mg/dL — ABNORMAL HIGH (ref 0.61–1.24)
GFR calc Af Amer: 18 mL/min — ABNORMAL LOW (ref >60)
GFR calc non Af Amer: 16 mL/min — ABNORMAL LOW (ref >60)
Glucose, Bld: 161 mg/dL — ABNORMAL HIGH (ref 65–99)
Potassium: 3.9 mmol/L (ref 3.5–5.1)
Sodium: 135 mmol/L (ref 135–145)

## 2016-04-03 LAB — URINALYSIS, ROUTINE W REFLEX MICROSCOPIC
Bilirubin Urine: NEGATIVE
Glucose, UA: 100 mg/dL — AB
Ketones, ur: NEGATIVE mg/dL
Leukocytes, UA: NEGATIVE
Nitrite: NEGATIVE
Protein, ur: >300 mg/dL — AB
Specific Gravity, Urine: 1.01 (ref 1.005–1.030)
pH: 7 (ref 5.0–8.0)

## 2016-04-03 LAB — URINE MICROSCOPIC-ADD ON

## 2016-04-03 MED ORDER — LORAZEPAM 2 MG/ML IJ SOLN
1.0000 mg | Freq: Once | INTRAMUSCULAR | Status: AC
  Administered 2016-04-03: 1 mg via INTRAVENOUS
  Filled 2016-04-03: qty 1

## 2016-04-03 MED ORDER — VANCOMYCIN HCL IN DEXTROSE 1-5 GM/200ML-% IV SOLN
1000.0000 mg | INTRAVENOUS | Status: AC
Start: 2016-04-03 — End: 2016-04-03
  Administered 2016-04-03 (×2): 1000 mg via INTRAVENOUS
  Filled 2016-04-03 (×2): qty 200

## 2016-04-03 NOTE — Progress Notes (Signed)
Pharmacy Antibiotic Note  Mark [name] is a 60 y.o. male admitted on 04/03/2016 with bacteremia.  Pharmacy has been consulted for vancomycin dosing. 1/2 blood cultures from 10/27 with gpc per MD's note.  Plan: Vancomycin 1 gm IV q1 hr x 2 for total load of 2 gm. F/u cultures, clinical course and HD schedule  Height: 6' (182.9 cm) Weight: 270 lb (122.5 kg) IBW/kg (Calculated) : 77.6  Temp (24hrs), Avg:97.9 F (36.6 C), Min:97.9 F (36.6 C), Max:97.9 F (36.6 C)   Recent Labs Lab 03/31/16 1920 04/03/16 1619  WBC 7.0 6.4  CREATININE 3.94* 3.80*    Estimated Creatinine Clearance: 28 mL/min (by C-G formula based on SCr of 3.8 mg/dL (H)).    No Known Allergies  Antimicrobials this admission: vanc 10/30 >>    Microbiology results: 10/27 BCx: 1/2 gpc  Thank you for allowing pharmacy to be a part of this patient's care.  Excell Seltzer Poteet 04/03/2016 6:18 PM

## 2016-04-03 NOTE — ED Notes (Signed)
Pt called out stating he became very anxious, hot, and felt like he needed to leave. Pt took 0.5 Xanax tablet from home, per MD Wilson Singer approval, at [phone]. MD Kohut notified.

## 2016-04-03 NOTE — ED Notes (Signed)
Per MD Wilson Singer pt is allowed to take home medication for anxiety and pain.

## 2016-04-03 NOTE — ED Notes (Signed)
Patient states that he is having some itching on right arm. States that he is not having any pain at this time.

## 2016-04-03 NOTE — ED Notes (Signed)
Pt made aware for need of urine sample.

## 2016-04-03 NOTE — ED Provider Notes (Signed)
[facility] DEPT Provider Note   CSN: [id number] Arrival date & time: 04/03/16  1550     History   Chief Complaint Chief Complaint  Patient presents with  . Wound Infection    HPI Mark [name] is a 60 y.o. male.  HPI  60 year old male referred to ED for evaluation and reportedly had positive blood culture which was drawn at dialysis. Patient is not sure specific results mortise of the culture data with him. He goes to [facility] dialysis in needed. He reports generalized weakness/fatigue. No fever. Mild nausea. No specific pain at dialysis catheter or drainage or redness.  Past Medical History:  Diagnosis Date  . Anxiety    occas. panic attack, takes xanax occas  . Arthritis    herniated disc, lumbar   . Back pain   . CAD (coronary artery disease)    03/25/2015 95% prox to mid LAD treated with Promus Premier DES 3.5x13mm postdilated to 4.11mm, 100% prox to distal RCA lesion filled in the PDA region via collaterals from LAD and PLA  . Cancer ([place])    - skin ca on face- removed   . CHF (congestive heart failure) ([place])   . Chronic kidney disease   . Complication of anesthesia    " it takes a long time to get over it."   . Diabetes mellitus without complication ([place])    type 2  . Fibromyalgia   . GERD (gastroesophageal reflux disease)    otc- pepcid , approx. every other  day    . H/O acute respiratory failure   . Hematuria    being followed by Dr. Hinda Lenis for decreased kidney function   . History of blood product transfusion 02/13/2014   After having back surgery  . Hypercholesteremia   . Hypertension   . Myocardial infarction   . Pneumonia    "walking"  . Shortness of breath dyspnea   . Sleep apnea    test aborted, due to not able to relax , since the aborted test he had surgery for gallbladder & he reports that he was told that he has sleep apnea     Patient Active Problem List   Diagnosis Date Noted  . Abnormal stress test 03/25/2015  . Abnormal nuclear  stress test   . Cardiomyopathy ([place])   . Chronic systolic heart failure ([place])   . ESRD (end stage renal disease) on dialysis ([place])   . Pre-operative cardiovascular examination   . Positive cardiac stress test 03/24/2015  . Essential hypertension   . Acute respiratory failure ([place]) 02/12/2015  . Renal failure   . Hyperkalemia   . Chronic kidney disease (CKD), stage IV (severe) ([place]) 01/22/2015  . HNP (herniated nucleus pulposus), lumbar 02/13/2014  . Abnormal ECG 02/12/2014  . HTN (hypertension) 02/12/2014  . Bradycardia 02/12/2014    Past Surgical History:  Procedure Laterality Date  . AV FISTULA PLACEMENT Left 02/02/2015   Procedure: LEFT ARM RADIOCEPHALIC ARTERIOVENOUS (AV) FISTULA CREATION;  Surgeon: Conrad Mucarabones, MD;  Location: [facility];  Service: Vascular;  Laterality: Left;  . BACK SURGERY  02/13/14  . CARDIAC CATHETERIZATION N/A 03/25/2015   Procedure: Left Heart Cath and Coronary Angiography;  Surgeon: Leonie Man, MD;  Location: [facility];  Service: Cardiovascular;  Laterality: N/A;  . CARDIAC CATHETERIZATION N/A 03/25/2015   Procedure: Coronary Stent Intervention;  Surgeon: Leonie Man, MD;  Location: [facility];  Service: Cardiovascular;  Laterality: N/A;  . CHOLECYSTECTOMY    . COLONOSCOPY    .  EYE SURGERY     cataracts remove, bilateral, w/IOL.  Marland Kitchen INSERTION OF DIALYSIS CATHETER N/A 03/09/2015   Procedure: INSERTION OF DIALYSIS CATHETER;  Surgeon: Conrad Independence, MD;  Location: [facility];  Service: Vascular;  Laterality: N/A;  . LUMBAR LAMINECTOMY/DECOMPRESSION MICRODISCECTOMY Right 02/13/2014   Procedure: RIGHT LUMBAR FOUR-FIVE, RIGHT LUMBAR FIVE- SACRAL ONE LUMBAR LAMINECTOMY/DECOMPRESSION MICRODISCECTOMY ;  Surgeon: Ashok Pall, MD;  Location: [facility];  Service: Neurosurgery;  Laterality: Right;  Right L4-5 and Right L5-S1 diskectomies  . TONSILLECTOMY         Home Medications    Prior to Admission medications   Medication Sig Start Date End  Date Taking? Authorizing Provider  acetaminophen (TYLENOL) 500 MG tablet Take 500 mg by mouth every 6 (six) hours as needed for mild pain or moderate pain.   Yes Historical Provider, MD  ALPRAZolam Duanne Moron) 1 MG tablet Take 1 mg by mouth 2 (two) times daily as needed for anxiety.  12/21/14  Yes Historical Provider, MD  aspirin EC 81 MG tablet Take 81 mg by mouth every evening.    Yes Historical Provider, MD  atorvastatin (LIPITOR) 40 MG tablet Take 40 mg by mouth at bedtime.   Yes Historical Provider, MD  calcium carbonate (TUMS - DOSED IN MG ELEMENTAL CALCIUM) 500 MG chewable tablet Chew 1 tablet by mouth daily as needed for indigestion or heartburn.   Yes Historical Provider, MD  furosemide (LASIX) 40 MG tablet Take 80 mg by mouth See admin instructions. 80mg  on Tuesdays, Thursdays, Saturdays, and Sundays. Takes 40mg  on MWF (dialysis days)   Yes Historical Provider, MD  glipiZIDE (GLUCOTROL XL) 5 MG 24 hr tablet Take 1 tablet (5 mg total) by mouth daily before breakfast. Patient taking differently: Take 2.5 mg by mouth daily before breakfast.  02/15/15  Yes Thurnell Lose, MD  hydrALAZINE (APRESOLINE) 50 MG tablet Take 1.5 tablets (75 mg total) by mouth 3 (three) times daily. 08/24/15  Yes Herminio Commons, MD  HYDROcodone-acetaminophen (NORCO) 10-325 MG per tablet Take 1 tablet by mouth every 6 (six) hours as needed for severe pain.  02/10/15  Yes Historical Provider, MD  multivitamin (RENA-VIT) TABS tablet Take 1 tablet by mouth every evening.    Yes Historical Provider, MD  nitroGLYCERIN (NITROSTAT) 0.4 MG SL tablet Place 1 tablet (0.4 mg total) under the tongue every 5 (five) minutes as needed. Patient taking differently: Place 0.4 mg under the tongue every 5 (five) minutes as needed for chest pain.  03/23/15  Yes Herminio Commons, MD  NON FORMULARY Take 1 tablet by mouth daily as needed (for restful legs. **HYLANDS RESTFUL LEGS FORMULA*).   Yes Historical Provider, MD  ranitidine (ZANTAC) 150  MG tablet Take 150 mg by mouth daily as needed for heartburn.   Yes Historical Provider, MD  sodium bicarbonate 650 MG tablet Take 650 mg by mouth 3 (three) times daily.  01/09/15  Yes Historical Provider, MD  ticagrelor (BRILINTA) 90 MG TABS tablet Take 1 tablet (90 mg total) by mouth 2 (two) times daily. 03/26/15  Yes Almyra Deforest, PA  traZODone (DESYREL) 50 MG tablet Take 50-100 mg by mouth at bedtime as needed for sleep.    Yes Historical Provider, MD    Family History Family History  Problem Relation Age of Onset  . Adopted: Yes  . Prostate cancer Father   . Cancer Father     prostate  . Heart failure Maternal Grandmother   . Heart attack Maternal Grandfather   .  Colon cancer Maternal Grandfather     Social History Social History  Substance Use Topics  . Smoking status: Former Smoker    Packs/day: 0.50    Years: 40.00    Types: Cigarettes    Start date: 10/17/1973    Quit date: 02/07/2015  . Smokeless tobacco: Never Used  . Alcohol use No     Allergies   Review of patient's allergies indicates no known allergies.   Review of Systems Review of Systems  All systems reviewed and negative, other than as noted in HPI.   Physical Exam Updated Vital Signs BP 149/71 (BP Location: Left Arm)   Pulse 74   Temp 97.9 F (36.6 C) (Oral)   Resp 20   Ht 6' (1.829 m)   Wt 270 lb (122.5 kg)   SpO2 100%   BMI 36.62 kg/m   Physical Exam  Constitutional: He appears well-developed and well-nourished. No distress.  HENT:  Head: Normocephalic and atraumatic.  Eyes: Conjunctivae are normal. Right eye exhibits no discharge. Left eye exhibits no discharge.  Neck: Neck supple.  Cardiovascular: Normal rate, regular rhythm and normal heart sounds.  Exam reveals no gallop and no friction rub.   No murmur heard. Dialysis catheter right chest looks fine externally. No concerning skin changes. No drainage.  Pulmonary/Chest: Effort normal and breath sounds normal. No respiratory distress.    Abdominal: Soft. He exhibits no distension. There is no tenderness.  Musculoskeletal: He exhibits no edema or tenderness.  Neurological: He is alert.  Skin: Skin is warm and dry.  Psychiatric: He has a normal mood and affect. His behavior is normal. Thought content normal.  Nursing note and vitals reviewed.    ED Treatments / Results  Labs (all labs ordered are listed, but only abnormal results are displayed) Labs Reviewed  CBC WITH DIFFERENTIAL/PLATELET - Abnormal; Notable for the following:       Result Value   RBC 3.07 (*)    Hemoglobin 9.5 (*)    HCT 29.7 (*)    Lymphs Abs 0.4 (*)    All other components within normal limits  BASIC METABOLIC PANEL - Abnormal; Notable for the following:    Chloride 100 (*)    Glucose, Bld 161 (*)    BUN 22 (*)    Creatinine, Ser 3.80 (*)    Calcium 8.3 (*)    GFR calc non Af Amer 16 (*)    GFR calc Af Amer 18 (*)    All other components within normal limits  CULTURE, BLOOD (ROUTINE X 2)  CULTURE, BLOOD (ROUTINE X 2)  URINALYSIS, ROUTINE W REFLEX MICROSCOPIC (NOT AT [facility])    EKG  EKG Interpretation None       Radiology No results found.  Procedures Procedures (including critical care time)  Medications Ordered in ED Medications - No data to display   Initial Impression / Assessment and Plan / ED Course  I have reviewed the triage vital signs and the nursing notes.  Pertinent labs & imaging results that were available during my care of the patient were reviewed by me and considered in my medical decision making (see chart for details).  Clinical Course     Final Clinical Impressions(s) / ED Diagnoses   Final diagnoses:  Positive blood culture   60yM with 1/2 positive cultures from 10/27. Could be contaminant, but with gram positive cocci in clusters will cover because of possibility of staph aureus. He is nontoxic though. Will repeat set of cultures.  Discussed  with ID. With patient being nontoxic, recommending  repeating cultures and giving dose of vanc until fully resulted.   New Prescriptions New Prescriptions   No medications on file     Virgel Manifold, MD 04/10/16 1222

## 2016-04-03 NOTE — ED Notes (Signed)
This nurse asked pt about itching on arm pt states it is where the tape is. No hives, redness, or swelling noted. Asked pt if he would like tape to be taken off and use paper tape, pt denied.

## 2016-04-03 NOTE — ED Triage Notes (Signed)
Patient sent over by Dr Lowanda Foster for infection in patient's dialysis catheter. Patient states "they took blood samples Friday and it came back saying I had infection in my blood."

## 2016-04-03 NOTE — ED Notes (Signed)
Pt states he received dialysis today and was told he had a staph infection in his catheter. States malaise, denies fever, N/V/D.

## 2016-04-03 NOTE — ED Notes (Signed)
Pt ambulatory to restroom at this time. 

## 2016-04-04 ENCOUNTER — Emergency Department (HOSPITAL_COMMUNITY)
Admission: EM | Admit: 2016-04-04 | Discharge: 2016-04-04 | Disposition: A | Discharge status: Home / Self Care | Payer: Managed Care, Other (non HMO) | Attending: Emergency Medicine | Admitting: Emergency Medicine

## 2016-04-04 ENCOUNTER — Telehealth: Payer: Self-pay | Admitting: Emergency Medicine

## 2016-04-04 ENCOUNTER — Encounter (HOSPITAL_COMMUNITY): Payer: Self-pay | Admitting: Emergency Medicine

## 2016-04-04 ENCOUNTER — Emergency Department (HOSPITAL_BASED_OUTPATIENT_CLINIC_OR_DEPARTMENT_OTHER): Payer: Managed Care, Other (non HMO)

## 2016-04-04 ENCOUNTER — Emergency Department (HOSPITAL_COMMUNITY): Payer: Managed Care, Other (non HMO)

## 2016-04-04 DIAGNOSIS — Z452 Encounter for adjustment and management of vascular access device: Secondary | ICD-10-CM | POA: Diagnosis present

## 2016-04-04 DIAGNOSIS — I5022 Chronic systolic (congestive) heart failure: Secondary | ICD-10-CM | POA: Insufficient documentation

## 2016-04-04 DIAGNOSIS — I132 Hypertensive heart and chronic kidney disease with heart failure and with stage 5 chronic kidney disease, or end stage renal disease: Secondary | ICD-10-CM | POA: Diagnosis not present

## 2016-04-04 DIAGNOSIS — Z7982 Long term (current) use of aspirin: Secondary | ICD-10-CM | POA: Insufficient documentation

## 2016-04-04 DIAGNOSIS — Z992 Dependence on renal dialysis: Secondary | ICD-10-CM | POA: Insufficient documentation

## 2016-04-04 DIAGNOSIS — T827XXA Infection and inflammatory reaction due to other cardiac and vascular devices, implants and grafts, initial encounter: Secondary | ICD-10-CM | POA: Diagnosis not present

## 2016-04-04 DIAGNOSIS — E1122 Type 2 diabetes mellitus with diabetic chronic kidney disease: Secondary | ICD-10-CM | POA: Insufficient documentation

## 2016-04-04 DIAGNOSIS — I2510 Atherosclerotic heart disease of native coronary artery without angina pectoris: Secondary | ICD-10-CM | POA: Diagnosis not present

## 2016-04-04 DIAGNOSIS — Z7984 Long term (current) use of oral hypoglycemic drugs: Secondary | ICD-10-CM | POA: Insufficient documentation

## 2016-04-04 DIAGNOSIS — R7881 Bacteremia: Secondary | ICD-10-CM

## 2016-04-04 DIAGNOSIS — N186 End stage renal disease: Secondary | ICD-10-CM | POA: Insufficient documentation

## 2016-04-04 DIAGNOSIS — R0602 Shortness of breath: Secondary | ICD-10-CM | POA: Diagnosis not present

## 2016-04-04 DIAGNOSIS — Z79899 Other long term (current) drug therapy: Secondary | ICD-10-CM | POA: Diagnosis not present

## 2016-04-04 DIAGNOSIS — R05 Cough: Secondary | ICD-10-CM | POA: Insufficient documentation

## 2016-04-04 DIAGNOSIS — Z87891 Personal history of nicotine dependence: Secondary | ICD-10-CM | POA: Insufficient documentation

## 2016-04-04 LAB — URINE MICROSCOPIC-ADD ON

## 2016-04-04 LAB — BASIC METABOLIC PANEL
ANION GAP: 8 (ref 5–15)
BUN: 33 mg/dL — ABNORMAL HIGH (ref 6–20)
CHLORIDE: 104 mmol/L (ref 101–111)
CO2: 25 mmol/L (ref 22–32)
CREATININE: 5.38 mg/dL — AB (ref 0.61–1.24)
Calcium: 8.5 mg/dL — ABNORMAL LOW (ref 8.9–10.3)
GFR calc non Af Amer: 10 mL/min — ABNORMAL LOW (ref >60)
GFR, EST AFRICAN AMERICAN: 12 mL/min — AB (ref >60)
Glucose, Bld: 150 mg/dL — ABNORMAL HIGH (ref 65–99)
POTASSIUM: 4.3 mmol/L (ref 3.5–5.1)
SODIUM: 137 mmol/L (ref 135–145)

## 2016-04-04 LAB — ECHOCARDIOGRAM COMPLETE
AVLVOTPG: 6 mmHg
CHL CUP DOP CALC LVOT VTI: 30.8 cm
CHL CUP TV REG PEAK VELOCITY: 235 cm/s
E decel time: 239 msec
E/e' ratio: 24.44
FS: 19 % — AB (ref 28–44)
HEIGHTINCHES: 72 in
IVS/LV PW RATIO, ED: 0.88
LA ID, A-P, ES: 56 mm
LA diam end sys: 56 mm
LA diam index: 2.21 cm/m2
LA vol index: 42.9 mL/m2
LA vol: 109 mL
LAVOLA4C: 116 mL
LDCA: 3.46 cm2
LV E/e'average: 24.44
LV TDI E'LATERAL: 5.4
LV TDI E'MEDIAL: 4.35
LV dias vol index: 101 mL/m2
LV sys vol: 158 mL — AB (ref 21–61)
LVDIAVOL: 256 mL — AB (ref 62–150)
LVEEMED: 24.44
LVELAT: 5.4 cm/s
LVOT peak vel: 118 cm/s
LVOTD: 21 mm
LVOTSV: 107 mL
LVSYSVOLIN: 62 mL/m2
Lateral S' vel: 13.5 cm/s
MV Dec: 239
MV Peak grad: 7 mmHg
MV pk E vel: 132 m/s
MVPKAVEL: 120 m/s
PW: 16.6 mm — AB (ref 0.6–1.1)
RV TAPSE: 24.9 mm
RV sys press: 25 mmHg
Simpson's disk: 38
Stroke v: 98 ml
TRMAXVEL: 235 cm/s
WEIGHTICAEL: 4320 [oz_av]

## 2016-04-04 LAB — BRAIN NATRIURETIC PEPTIDE: B Natriuretic Peptide: 447 pg/mL — ABNORMAL HIGH (ref 0.0–100.0)

## 2016-04-04 LAB — URINALYSIS, ROUTINE W REFLEX MICROSCOPIC
BILIRUBIN URINE: NEGATIVE
Glucose, UA: 100 mg/dL — AB
Hgb urine dipstick: NEGATIVE
Ketones, ur: NEGATIVE mg/dL
LEUKOCYTES UA: NEGATIVE
NITRITE: NEGATIVE
PH: 7 (ref 5.0–8.0)
Protein, ur: >300 mg/dL — AB
SPECIFIC GRAVITY, URINE: 1.015 (ref 1.005–1.030)

## 2016-04-04 LAB — CBC WITH DIFFERENTIAL/PLATELET
BASOS ABS: 0 10*3/uL (ref 0.0–0.1)
BASOS PCT: 0 %
EOS PCT: 4 %
Eosinophils Absolute: 0.2 10*3/uL (ref 0.0–0.7)
HCT: 30.1 % — ABNORMAL LOW (ref 39.0–52.0)
Hemoglobin: 9.4 g/dL — ABNORMAL LOW (ref 13.0–17.0)
LYMPHS PCT: 11 %
Lymphs Abs: 0.7 10*3/uL (ref 0.7–4.0)
MCH: 30.4 pg (ref 26.0–34.0)
MCHC: 31.2 g/dL (ref 30.0–36.0)
MCV: 97.4 fL (ref 78.0–100.0)
MONO ABS: 0.4 10*3/uL (ref 0.1–1.0)
Monocytes Relative: 7 %
Neutro Abs: 4.6 10*3/uL (ref 1.7–7.7)
Neutrophils Relative %: 78 %
PLATELETS: 161 10*3/uL (ref 150–400)
RBC: 3.09 MIL/uL — AB (ref 4.22–5.81)
RDW: 15.5 % (ref 11.5–15.5)
WBC: 5.9 10*3/uL (ref 4.0–10.5)

## 2016-04-04 MED ORDER — ALPRAZOLAM 0.5 MG PO TABS
1.0000 mg | ORAL_TABLET | Freq: Once | ORAL | Status: AC | PRN
  Administered 2016-04-04: 1 mg via ORAL
  Filled 2016-04-04: qty 2

## 2016-04-04 MED ORDER — LIDOCAINE HCL (PF) 2 % IJ SOLN
INTRAMUSCULAR | Status: AC
  Filled 2016-04-04: qty 10

## 2016-04-04 MED ORDER — VANCOMYCIN HCL IN DEXTROSE 1-5 GM/200ML-% IV SOLN
1000.0000 mg | Freq: Once | INTRAVENOUS | Status: AC
  Administered 2016-04-04: 1000 mg via INTRAVENOUS
  Filled 2016-04-04: qty 200

## 2016-04-04 NOTE — ED Notes (Signed)
echocardiogram being done at the bedside

## 2016-04-04 NOTE — ED Notes (Signed)
Family at the bedside.

## 2016-04-04 NOTE — ED Notes (Signed)
Hold patient til Dr Rosana Hoes can come pull catheter, Pt can be discharge after procedure

## 2016-04-04 NOTE — ED Notes (Signed)
Dr Rosana Hoes holding pressure, bandage applied.  Instruction to watch patient for one hour. Pt to  see Dr in office tomorrow. Pt is aware

## 2016-04-04 NOTE — ED Triage Notes (Addendum)
Patient states that he was told to come back in by Dr. Florentina Addison office due to bacteria found in dialysis port in upper right chest.

## 2016-04-04 NOTE — Progress Notes (Signed)
*  PRELIMINARY RESULTS* Echocardiogram 2D Echocardiogram has been performed.  Samuel Germany 04/04/2016, 4:11 PM

## 2016-04-04 NOTE — ED Provider Notes (Signed)
[name] DEPT Provider Note   CSN: [id number] Arrival date & time: 04/04/16  1100  By signing my name below, I, Rayna Sexton, attest that this documentation has been prepared under the direction and in the presence of Forde Dandy, MD. Electronically Signed: Rayna Sexton, ED Scribe. 04/04/16. 11:36 AM.   History   Chief Complaint Chief Complaint  Patient presents with  . Vascular Access Problem    HPI HPI Comments: Mark [name] is a 60 y.o. male who presents to the Emergency Department for evaluation of his dialysis catheter. Pt was sent over yesterday by his dialysis center to have his catheter removed due to positive blood culture. He had reassuring work-up in the ED, given dose of vancomycin and discharged. He recently had a blood culture performed which was positive for gram + cocci 10/27 during dialysis, he states. He was sent to the ED today by Dr. Florentina Addison office (Nephrology) due to his culture and concern regarding his catheter being the cause. He states he has been experiencing SOB and a dry cough when lying in bed for the past few weeks. States several days ago, seen in ED for fatigue and shortness of breath, but states he has been feeling better.  He denies a h/o of infection in his catheter which has been in place ~1 year. He was last dialyzed yesterday and confirms it was a full round (MWF dialysis pt). He confirms being compliant with his lasix but did not take his dose this morning. He denies vomiting, chills, night sweats, fatigue and fever.   The history is provided by the patient. No language interpreter was used.    Past Medical History:  Diagnosis Date  . Anxiety    occas. panic attack, takes xanax occas  . Arthritis    herniated disc, lumbar   . Back pain   . CAD (coronary artery disease)    03/25/2015 95% prox to mid LAD treated with Promus Premier DES 3.5x66mm postdilated to 4.62mm, 100% prox to distal RCA lesion filled in the PDA region via  collaterals from LAD and PLA  . Cancer ([place])    - skin ca on face- removed   . CHF (congestive heart failure) ([place])   . Chronic kidney disease   . Complication of anesthesia    " it takes a long time to get over it."   . Diabetes mellitus without complication ([place])    type 2  . Fibromyalgia   . GERD (gastroesophageal reflux disease)    otc- pepcid , approx. every other  day    . H/O acute respiratory failure   . Hematuria    being followed by Dr. Hinda Lenis for decreased kidney function   . History of blood product transfusion 02/13/2014   After having back surgery  . Hypercholesteremia   . Hypertension   . Myocardial infarction   . Pneumonia    "walking"  . Shortness of breath dyspnea   . Sleep apnea    test aborted, due to not able to relax , since the aborted test he had surgery for gallbladder & he reports that he was told that he has sleep apnea     Patient Active Problem List   Diagnosis Date Noted  . Bacteremia associated with intravascular line ([place]) 04/04/2016  . Abnormal stress test 03/25/2015  . Abnormal nuclear stress test   . Cardiomyopathy ([place])   . Chronic systolic heart failure ([place])   . ESRD (end stage renal disease) on dialysis ([place])   .  Pre-operative cardiovascular examination   . Positive cardiac stress test 03/24/2015  . Essential hypertension   . Acute respiratory failure ([place]) 02/12/2015  . Renal failure   . Hyperkalemia   . Chronic kidney disease (CKD), stage IV (severe) ([place]) 01/22/2015  . HNP (herniated nucleus pulposus), lumbar 02/13/2014  . Abnormal ECG 02/12/2014  . HTN (hypertension) 02/12/2014  . Bradycardia 02/12/2014    Past Surgical History:  Procedure Laterality Date  . AV FISTULA PLACEMENT Left 02/02/2015   Procedure: LEFT ARM RADIOCEPHALIC ARTERIOVENOUS (AV) FISTULA CREATION;  Surgeon: Conrad Williamsburg, MD;  Location: [facility];  Service: Vascular;  Laterality: Left;  . BACK SURGERY  02/13/14  . CARDIAC CATHETERIZATION N/A 03/25/2015    Procedure: Left Heart Cath and Coronary Angiography;  Surgeon: Leonie Man, MD;  Location: [facility];  Service: Cardiovascular;  Laterality: N/A;  . CARDIAC CATHETERIZATION N/A 03/25/2015   Procedure: Coronary Stent Intervention;  Surgeon: Leonie Man, MD;  Location: [facility];  Service: Cardiovascular;  Laterality: N/A;  . CHOLECYSTECTOMY    . COLONOSCOPY    . EYE SURGERY     cataracts remove, bilateral, w/IOL.  Marland Kitchen INSERTION OF DIALYSIS CATHETER N/A 03/09/2015   Procedure: INSERTION OF DIALYSIS CATHETER;  Surgeon: Conrad Bronson, MD;  Location: [facility];  Service: Vascular;  Laterality: N/A;  . LUMBAR LAMINECTOMY/DECOMPRESSION MICRODISCECTOMY Right 02/13/2014   Procedure: RIGHT LUMBAR FOUR-FIVE, RIGHT LUMBAR FIVE- SACRAL ONE LUMBAR LAMINECTOMY/DECOMPRESSION MICRODISCECTOMY ;  Surgeon: Ashok Pall, MD;  Location: [facility];  Service: Neurosurgery;  Laterality: Right;  Right L4-5 and Right L5-S1 diskectomies  . TONSILLECTOMY       Home Medications    Prior to Admission medications   Medication Sig Start Date End Date Taking? Authorizing Provider  acetaminophen (TYLENOL) 500 MG tablet Take 500 mg by mouth every 6 (six) hours as needed for mild pain or moderate pain.   Yes Historical Provider, MD  ALPRAZolam Duanne Moron) 1 MG tablet Take 1 mg by mouth 2 (two) times daily as needed for anxiety.  12/21/14  Yes Historical Provider, MD  aspirin EC 81 MG tablet Take 81 mg by mouth every evening.    Yes Historical Provider, MD  atorvastatin (LIPITOR) 40 MG tablet Take 40 mg by mouth at bedtime.   Yes Historical Provider, MD  calcium carbonate (TUMS - DOSED IN MG ELEMENTAL CALCIUM) 500 MG chewable tablet Chew 1 tablet by mouth daily as needed for indigestion or heartburn.   Yes Historical Provider, MD  furosemide (LASIX) 40 MG tablet Take 80 mg by mouth See admin instructions. 80mg  on Tuesdays, Thursdays, Saturdays, and Sundays. Takes 40mg  on MWF (dialysis days)   Yes Historical  Provider, MD  glipiZIDE (GLUCOTROL XL) 5 MG 24 hr tablet Take 1 tablet (5 mg total) by mouth daily before breakfast. Patient taking differently: Take 2.5 mg by mouth daily before breakfast.  02/15/15  Yes Thurnell Lose, MD  hydrALAZINE (APRESOLINE) 50 MG tablet Take 1.5 tablets (75 mg total) by mouth 3 (three) times daily. 08/24/15  Yes Herminio Commons, MD  HYDROcodone-acetaminophen (NORCO) 10-325 MG per tablet Take 1 tablet by mouth every 6 (six) hours as needed for severe pain.  02/10/15  Yes Historical Provider, MD  multivitamin (RENA-VIT) TABS tablet Take 1 tablet by mouth every evening.    Yes Historical Provider, MD  nitroGLYCERIN (NITROSTAT) 0.4 MG SL tablet Place 1 tablet (0.4 mg total) under the tongue every 5 (five) minutes as needed. Patient taking  differently: Place 0.4 mg under the tongue every 5 (five) minutes as needed for chest pain.  03/23/15  Yes Herminio Commons, MD  NON FORMULARY Take 1 tablet by mouth daily as needed (for restful legs. **HYLANDS RESTFUL LEGS FORMULA*).   Yes Historical Provider, MD  ranitidine (ZANTAC) 150 MG tablet Take 150 mg by mouth daily as needed for heartburn.   Yes Historical Provider, MD  sodium bicarbonate 650 MG tablet Take 650 mg by mouth 3 (three) times daily.  01/09/15  Yes Historical Provider, MD  ticagrelor (BRILINTA) 90 MG TABS tablet Take 1 tablet (90 mg total) by mouth 2 (two) times daily. 03/26/15  Yes Almyra Deforest, PA  traZODone (DESYREL) 50 MG tablet Take 50-100 mg by mouth at bedtime as needed for sleep.    Yes Historical Provider, MD    Family History Family History  Problem Relation Age of Onset  . Adopted: Yes  . Prostate cancer Father   . Cancer Father     prostate  . Heart failure Maternal Grandmother   . Heart attack Maternal Grandfather   . Colon cancer Maternal Grandfather     Social History Social History  Substance Use Topics  . Smoking status: Former Smoker    Packs/day: 0.50    Years: 40.00    Types: Cigarettes      Start date: 10/17/1973    Quit date: 02/07/2015  . Smokeless tobacco: Never Used  . Alcohol use No     Allergies   Review of patient's allergies indicates no known allergies.   Review of Systems Review of Systems  Constitutional: Negative for chills, diaphoresis, fatigue and fever.  Respiratory: Positive for cough and shortness of breath.   Gastrointestinal: Negative for nausea and vomiting.  Genitourinary: Negative for difficulty urinating.  All other systems reviewed and are negative.  Physical Exam Updated Vital Signs BP 157/72 (BP Location: Left Arm)   Pulse 89   Temp 97.4 F (36.3 C) (Oral)   Resp 20   Ht 6' (1.829 m)   Wt 270 lb (122.5 kg)   SpO2 99%   BMI 36.62 kg/m   Physical Exam Nursing note and vitals reviewed. Constitutional: Well developed, well nourished, non-toxic, and in no acute distress Head: Normocephalic and atraumatic.  Mouth/Throat: Oropharynx is clear and moist.  Neck: Normal range of motion. Neck supple.  Cardiovascular: Normal rate and regular rhythm.   Pulmonary/Chest: Effort normal and breath sounds normal. Dialysis catheter in the right upper chest wall w/o surrounding erythema, induration or drainage.  Abdominal: Soft. There is no tenderness. There is no rebound and no guarding.  Musculoskeletal: Normal range of motion.  Neurological: Alert, no facial droop, fluent speech, moves all extremities symmetrically Skin: Skin is warm and dry.  Psychiatric: Cooperative  ED Treatments / Results  Labs (all labs ordered are listed, but only abnormal results are displayed) Labs Reviewed  CBC WITH DIFFERENTIAL/PLATELET - Abnormal; Notable for the following:       Result Value   RBC 3.09 (*)    Hemoglobin 9.4 (*)    HCT 30.1 (*)    All other components within normal limits  BRAIN NATRIURETIC PEPTIDE - Abnormal; Notable for the following:    B Natriuretic Peptide 447.0 (*)    All other components within normal limits  BASIC METABOLIC PANEL -  Abnormal; Notable for the following:    Glucose, Bld 150 (*)    BUN 33 (*)    Creatinine, Ser 5.38 (*)    Calcium  8.5 (*)    GFR calc non Af Amer 10 (*)    GFR calc Af Amer 12 (*)    All other components within normal limits  CULTURE, BLOOD (ROUTINE X 2)  CULTURE, BLOOD (ROUTINE X 2)  URINALYSIS, ROUTINE W REFLEX MICROSCOPIC (NOT AT [facility])    EKG  EKG Interpretation None       Radiology Dg Chest 2 View  Result Date: 04/04/2016 CLINICAL DATA:  Nonproductive cough and orthopnea for 4 weeks. Fatigue. Chronic kidney disease on dialysis. EXAM: CHEST  2 VIEW COMPARISON:  03/31/2016 FINDINGS: Stable mild cardiomegaly. Stable mild diffuse pulmonary interstitial prominence. No evidence of acute infiltrate or pleural effusion. Right jugular dual-lumen central venous dialysis catheter remains in appropriate position. IMPRESSION: Stable mild cardiomegaly and chronic interstitial prominence. No acute findings. Electronically Signed   By: Earle Gell M.D.   On: 04/04/2016 12:33    Procedures Procedures  DIAGNOSTIC STUDIES: Oxygen Saturation is 99% on RA, normal by my interpretation.    COORDINATION OF CARE: 11:36 AM Discussed next steps with pt. Pt verbalized understanding and is agreeable with the plan.    Medications Ordered in ED Medications  ALPRAZolam (XANAX) tablet 1 mg (1 mg Oral Given 04/04/16 1144)  vancomycin (VANCOCIN) IVPB 1000 mg/200 mL premix (0 mg Intravenous Stopped 04/04/16 1448)     Initial Impression / Assessment and Plan / ED Course  I have reviewed the triage vital signs and the nursing notes.  Pertinent labs & imaging results that were available during my care of the patient were reviewed by me and considered in my medical decision making (see chart for details).  Clinical Course    Sent by Dr. Hinda Lenis for positive blood culture 10/27 from dialysis. Blood cultures from < 24 hours ago with no growth to date. Vital signs within normal limits, he is well  appearing. Dr. Hinda Lenis had called ahead of time concerned for catheter infection. He had requested admission, c/s to vascular regarding dialysis line replacement, and ID consult.   Blood work today without significant leukocytosis. He has stable anemia and renal function.   Discussed with Dr. Sarajane Jews. Given patient well appearing, w/o systemic signs or symptoms of illness and can receive IV antibiotics via dialysis, did not necessarily require admission. Dr. Sarajane Jews as arranged Dr. Rosana Hoes from vascular surgery to pull catheter today. He will arrange outpatient appointment for temporarily dialysis catheter placement prior to next dialysis appointment. Will arrange antibiotics at dialysis through Dr. Lowanda Foster. This is very reasonable plan.   Strict return and follow-up instructions reviewed. He expressed understanding of all discharge instructions and felt comfortable with the plan of care.      I personally performed the services described in this documentation, which was scribed in my presence. The recorded information has been reviewed and is accurate.  Final Clinical Impressions(s) / ED Diagnoses   Final diagnoses:  Positive blood culture    New Prescriptions New Prescriptions   No medications on file     Forde Dandy, MD 04/04/16 1457

## 2016-04-04 NOTE — Consult Note (Signed)
Medical Consult in ED  Finnis Colee YKD:[id number] DOB: [birth year] DOA: 04/04/2016  PCP: Deloria Lair, MD  Nephrologist is Dr. Hinda Lenis.  Cardiologist is Dr. Bronson Ing  Impressions/Recommendation 1. Recurrent GPC bacteremia, presumed line infection (tunneled HD catheter placed 02/2015). Completely asymptomatic, normal wbc, normal hemodynamics. 2. ESRD on hemodialysis Monday Wednesday Friday. Potassium normal. 3. DM type 2, random blood sugar 150. Anion gap normal. 4. CAD asymptomatic. 5. Chronic systolic congestive heart failure. Euvolemic.   Patient appears clinically well and is completely asymptomatic. No fever or leukocytosis. No indication for observation or admission at this time. I discussed his care with Dr. Hinda Lenis, Dr. Rosana Hoes and Dr. Baxter Flattery.   Dr. Rosana Hoes plans to remove tunneled catheter today and will make arrangements for new permanent catheter placement in the near future, anticipated 11/3.   Per discussion with infectious disease, recommend 48 hour "line holiday", therefore would be clear for tunneled catheter placement 11/3.  Recommend repeat blood cultures November 1, if remains negative, can place new tunneled line 11/3.  Dr. Hinda Lenis will coordinate with Dr. Rosana Hoes regarding timing of next dialysis session as the patient will need temporary access placed for dialysis either November 1 or November 2.  Echocardiogram ordered and will be completed today  Discussed recommendations with attending EDP Dr. Viviana Simpler. patient may be discharged home after line has been pulled today. He understands to return for fever or worsening of condition.   Patient coming from: home  Chief Complaint: bacteria in blood   HPI:  60 year old man with end-stage renal disease on hemodialysis Monday, Wednesday, Friday since the emergency department today for positive blood culture.  History obtained from patient as well as his nephrologist Dr. Hinda Lenis. Approximately one month ago the  patient had Staphylococcus epidermidis bacteremia which was treated with 3 weeks of antibiotics. He had repeat blood cultures drawn after treatment was complete, on 10/27, these cultures now show gram-positive cocci. Identification not yet available. The patient was sent to the emergency department 10/30, was treated with vancomycin and discharged home. His nephrologist called him again today and told him to come back to the emergency department for further treatment.  Patient feels well and has no complaints. No fever, negative review of systems. No breathing difficulties. No localizing symptoms. He would like to go home.  ED Course: afebrile, VSS, no hypoxia, treated with vancomycin Pertinent labs: BMP, CBC unremarkable, stable Hgb 9.4. BC 10/30 NGTD. Referenced positive blood culture not in Epic.  Review of Systems:  Negative for fever, visual changes, sore throat, rash, new muscle aches, chest pain, SOB, dysuria, bleeding, n/v/abdominal pain.  Past Medical History:  Diagnosis Date  . Anxiety    occas. panic attack, takes xanax occas  . Arthritis    herniated disc, lumbar   . Back pain   . CAD (coronary artery disease)    03/25/2015 95% prox to mid LAD treated with Promus Premier DES 3.5x20mm postdilated to 4.13mm, 100% prox to distal RCA lesion filled in the PDA region via collaterals from LAD and PLA  . Cancer ([facility])    - skin ca on face- removed   . CHF (congestive heart failure) ([place])   . Chronic kidney disease   . Complication of anesthesia    " it takes a long time to get over it."   . Diabetes mellitus without complication ([place])    type 2  . Fibromyalgia   . GERD (gastroesophageal reflux disease)    otc- pepcid , approx. every other  day    .  H/O acute respiratory failure   . Hematuria    being followed by Dr. Hinda Lenis for decreased kidney function   . History of blood product transfusion 02/13/2014   After having back surgery  . Hypercholesteremia   . Hypertension   .  Myocardial infarction   . Pneumonia    "walking"  . Shortness of breath dyspnea   . Sleep apnea    test aborted, due to not able to relax , since the aborted test he had surgery for gallbladder & he reports that he was told that he has sleep apnea     Past Surgical History:  Procedure Laterality Date  . AV FISTULA PLACEMENT Left 02/02/2015   Procedure: LEFT ARM RADIOCEPHALIC ARTERIOVENOUS (AV) FISTULA CREATION;  Surgeon: Conrad Algonac, MD;  Location: [place];  Service: Vascular;  Laterality: Left;  . BACK SURGERY  02/13/14  . CARDIAC CATHETERIZATION N/A 03/25/2015   Procedure: Left Heart Cath and Coronary Angiography;  Surgeon: Leonie Man, MD;  Location: [place] [facility];  Service: Cardiovascular;  Laterality: N/A;  . CARDIAC CATHETERIZATION N/A 03/25/2015   Procedure: Coronary Stent Intervention;  Surgeon: Leonie Man, MD;  Location: [place] [facility];  Service: Cardiovascular;  Laterality: N/A;  . CHOLECYSTECTOMY    . COLONOSCOPY    . EYE SURGERY     cataracts remove, bilateral, w/IOL.  Marland Kitchen INSERTION OF DIALYSIS CATHETER N/A 03/09/2015   Procedure: INSERTION OF DIALYSIS CATHETER;  Surgeon: Conrad Perth, MD;  Location: [place];  Service: Vascular;  Laterality: N/A;  . LUMBAR LAMINECTOMY/DECOMPRESSION MICRODISCECTOMY Right 02/13/2014   Procedure: RIGHT LUMBAR FOUR-FIVE, RIGHT LUMBAR FIVE- SACRAL ONE LUMBAR LAMINECTOMY/DECOMPRESSION MICRODISCECTOMY ;  Surgeon: Ashok Pall, MD;  Location: [facility];  Service: Neurosurgery;  Laterality: Right;  Right L4-5 and Right L5-S1 diskectomies  . TONSILLECTOMY       reports that he quit smoking about 13 months ago. His smoking use included Cigarettes. He started smoking about 42 years ago. He has a 20.00 pack-year smoking history. He has never used smokeless tobacco. He reports that he does not drink alcohol or use drugs. Ambulatory   No Known Allergies  Family History  Problem Relation Age of Onset  . Adopted: Yes  . Prostate cancer  Father   . Cancer Father     prostate  . Heart failure Maternal Grandmother   . Heart attack Maternal Grandfather   . Colon cancer Maternal Grandfather      Prior to Admission medications   Medication Sig Start Date End Date Taking? Authorizing Provider  acetaminophen (TYLENOL) 500 MG tablet Take 500 mg by mouth every 6 (six) hours as needed for mild pain or moderate pain.   Yes Historical Provider, MD  ALPRAZolam Duanne Moron) 1 MG tablet Take 1 mg by mouth 2 (two) times daily as needed for anxiety.  12/21/14  Yes Historical Provider, MD  aspirin EC 81 MG tablet Take 81 mg by mouth every evening.    Yes Historical Provider, MD  atorvastatin (LIPITOR) 40 MG tablet Take 40 mg by mouth at bedtime.   Yes Historical Provider, MD  calcium carbonate (TUMS - DOSED IN MG ELEMENTAL CALCIUM) 500 MG chewable tablet Chew 1 tablet by mouth daily as needed for indigestion or heartburn.   Yes Historical Provider, MD  furosemide (LASIX) 40 MG tablet Take 80 mg by mouth See admin instructions. 80mg  on Tuesdays, Thursdays, Saturdays, and Sundays. Takes 40mg  on MWF (dialysis days)   Yes Historical Provider, MD  glipiZIDE (GLUCOTROL XL) 5 MG 24 hr tablet Take 1 tablet (5 mg total) by mouth daily before breakfast. Patient taking differently: Take 2.5 mg by mouth daily before breakfast.  02/15/15  Yes Thurnell Lose, MD  hydrALAZINE (APRESOLINE) 50 MG tablet Take 1.5 tablets (75 mg total) by mouth 3 (three) times daily. 08/24/15  Yes Herminio Commons, MD  HYDROcodone-acetaminophen (NORCO) 10-325 MG per tablet Take 1 tablet by mouth every 6 (six) hours as needed for severe pain.  02/10/15  Yes Historical Provider, MD  multivitamin (RENA-VIT) TABS tablet Take 1 tablet by mouth every evening.    Yes Historical Provider, MD  nitroGLYCERIN (NITROSTAT) 0.4 MG SL tablet Place 1 tablet (0.4 mg total) under the tongue every 5 (five) minutes as needed. Patient taking differently: Place 0.4 mg under the tongue every 5 (five)  minutes as needed for chest pain.  03/23/15  Yes Herminio Commons, MD  NON FORMULARY Take 1 tablet by mouth daily as needed (for restful legs. **HYLANDS RESTFUL LEGS FORMULA*).   Yes Historical Provider, MD  ranitidine (ZANTAC) 150 MG tablet Take 150 mg by mouth daily as needed for heartburn.   Yes Historical Provider, MD  sodium bicarbonate 650 MG tablet Take 650 mg by mouth 3 (three) times daily.  01/09/15  Yes Historical Provider, MD  ticagrelor (BRILINTA) 90 MG TABS tablet Take 1 tablet (90 mg total) by mouth 2 (two) times daily. 03/26/15  Yes Almyra Deforest, PA  traZODone (DESYREL) 50 MG tablet Take 50-100 mg by mouth at bedtime as needed for sleep.    Yes Historical Provider, MD    Physical Exam: Vitals:   04/04/16 1115  BP: 157/72  Pulse: 89  Resp: 20  Temp: 97.4 F (36.3 C)  TempSrc: Oral  SpO2: 99%  Weight: 122.5 kg (270 lb)  Height: 6' (1.829 m)    Constitutional:  . Appears calm and comfortable Eyes:  . PERRL and irises appear normal . Normal conjunctivae and lids ENMT:  . grossly normal hearing . Lips appear normal Neck:  . neck appears normal Respiratory:  . CTA bilaterally, no w/r/r.  . Respiratory effort normal. No retractions or accessory muscle use Cardiovascular:  . RRR, no m/r/g . No LE extremity edema   Abdomen:  . Soft, no tenderness or masses Musculoskeletal:  . Digits/nails: no clubbing, cyanosis, petechiae, infection . Bilateral upper and lower extremity strength and tone grossly normal. No tenderness. Skin:  . No rashes, lesions, ulcers . palpation of skin: no induration or nodules Neurologic:  . Grossly normal Psychiatric:  . judgement and insight appear normal . Mental status o Mood, affect appropriate  Wt Readings from Last 3 Encounters:  04/04/16 122.5 kg (270 lb)  04/03/16 122.5 kg (270 lb)  03/07/16 127.9 kg (282 lb)    I have personally reviewed following labs and imaging studies  Labs on Admission:  CBC:  Recent Labs Lab  03/31/16 1920 04/03/16 1619 04/04/16 1208  WBC 7.0 6.4 5.9  NEUTROABS 5.8 5.3 4.6  HGB 9.6* 9.5* 9.4*  HCT 30.9* 29.7* 30.1*  MCV 96.3 96.7 97.4  PLT 157 175 161   Basic Metabolic Panel:  Recent Labs Lab 03/31/16 1920 04/03/16 1619 04/04/16 1208  NA 139 135 137  K 4.2 3.9 4.3  CL 102 100* 104  CO2 26 27 25   GLUCOSE 143* 161* 150*  BUN 19 22* 33*  CREATININE 3.94* 3.80* 5.38*  CALCIUM 8.6* 8.3* 8.5*   Liver Function Tests:  Recent Labs  Lab 03/31/16 1920  AST 27  ALT 25  ALKPHOS 57  BILITOT 0.7  PROT 6.5  ALBUMIN 3.5    ) Recent Results (from the past 240 hour(s))  Blood culture (routine x 2)     Status: None (Preliminary result)   Collection Time: 04/03/16  4:19 PM  Result Value Ref Range Status   Specimen Description BLOOD RIGHT WRIST  Final   Special Requests BOTTLES DRAWN AEROBIC AND ANAEROBIC 6CC  Final   Culture NO GROWTH < 24 HOURS  Final   Report Status PENDING  Incomplete  Blood culture (routine x 2)     Status: None (Preliminary result)   Collection Time: 04/03/16  5:16 PM  Result Value Ref Range Status   Specimen Description BLOOD RIGHT ARM  Final   Special Requests BOTTLES DRAWN AEROBIC AND ANAEROBIC 6CC  Final   Culture NO GROWTH < 24 HOURS  Final   Report Status PENDING  Incomplete  Blood culture (routine x 2)     Status: None (Preliminary result)   Collection Time: 04/04/16 12:08 PM  Result Value Ref Range Status   Specimen Description BLOOD RIGHT ANTECUBITAL  Final   Special Requests BOTTLES DRAWN AEROBIC AND ANAEROBIC 10CC  Final   Culture PENDING  Incomplete   Report Status PENDING  Incomplete  Blood culture (routine x 2)     Status: None (Preliminary result)   Collection Time: 04/04/16 12:12 PM  Result Value Ref Range Status   Specimen Description BLOOD RIGHT FOREARM  Final   Special Requests BOTTLES DRAWN AEROBIC AND ANAEROBIC 8CC EACH  Final   Culture PENDING  Incomplete   Report Status PENDING  Incomplete      Radiological  Exams on Admission: Dg Chest 2 View  Result Date: 04/04/2016 CLINICAL DATA:  Nonproductive cough and orthopnea for 4 weeks. Fatigue. Chronic kidney disease on dialysis. EXAM: CHEST  2 VIEW COMPARISON:  03/31/2016 FINDINGS: Stable mild cardiomegaly. Stable mild diffuse pulmonary interstitial prominence. No evidence of acute infiltrate or pleural effusion. Right jugular dual-lumen central venous dialysis catheter remains in appropriate position. IMPRESSION: Stable mild cardiomegaly and chronic interstitial prominence. No acute findings. Electronically Signed   By: Earle Gell M.D.   On: 04/04/2016 12:33     Principal Problem:   Bacteremia associated with intravascular line ([place]) Active Problems:   ESRD (end stage renal disease) on dialysis ([place])     Time spent: 90 minutes, involving counseling of patient, d/w with Drs. Cay Schillings and [place] and coordination of outpatient care  Murray Hodgkins, MD  [facility] Direct contact: [phone] --Via amion app OR  --[email or web address]; password [email or web address]  7PM-7AM contact night coverage as above  04/04/2016, 2:55 PM

## 2016-04-04 NOTE — ED Notes (Signed)
Dr Rosana Hoes at the bedside

## 2016-04-04 NOTE — Consult Note (Addendum)
SURGICAL CONSULTATION NOTE (initial) - cpt: 84166, [phone]  HISTORY OF PRESENT ILLNESS (HPI):  60 y.o. male with ESRD on HD via a tunneled Right IJ hemodialysis catheter x 1 year returned to [facility] ED today after blood cultures drawn in the ED yesterday (10/30) were again found to be positive for gram positive cocci in clusters (staph aureus). Patient's previous cultures drawn during hemodialysis 10/27 were also found to be positive for staph aureus, for which he was treated with vancomycin. Patient reports SOB and dry cough x a few weeks with fatigue and SOB for which he presented to ED a few days ago. Patient denies any pain, fever/chills, CP, or N/V.  Surgery is consulted by medical physician Dr. Sarajane Jews in this context for evaluation and management of infected tunneled Right IJ hemodialysis catheter with bacteremia.  PAST MEDICAL HISTORY (PMH):  Past Medical History:  Diagnosis Date  . Anxiety    occas. panic attack, takes xanax occas  . Arthritis    herniated disc, lumbar   . Back pain   . CAD (coronary artery disease)    03/25/2015 95% prox to mid LAD treated with Promus Premier DES 3.5x4mm postdilated to 4.59mm, 100% prox to distal RCA lesion filled in the PDA region via collaterals from LAD and PLA  . Cancer ([place])    - skin ca on face- removed   . CHF (congestive heart failure) ([place])   . Chronic kidney disease   . Complication of anesthesia    " it takes a long time to get over it."   . Diabetes mellitus without complication ([place])    type 2  . Fibromyalgia   . GERD (gastroesophageal reflux disease)    otc- pepcid , approx. every other  day    . H/O acute respiratory failure   . Hematuria    being followed by Dr. Hinda Lenis for decreased kidney function   . History of blood product transfusion 02/13/2014   After having back surgery  . Hypercholesteremia   . Hypertension   . Myocardial infarction   . Pneumonia    "walking"  . Shortness of breath dyspnea   . Sleep apnea    test  aborted, due to not able to relax , since the aborted test he had surgery for gallbladder & he reports that he was told that he has sleep apnea      PAST SURGICAL HISTORY ([place]):  Past Surgical History:  Procedure Laterality Date  . AV FISTULA PLACEMENT Left 02/02/2015   Procedure: LEFT ARM RADIOCEPHALIC ARTERIOVENOUS (AV) FISTULA CREATION;  Surgeon: Conrad Lawtey, MD;  Location: [place];  Service: Vascular;  Laterality: Left;  . BACK SURGERY  02/13/14  . CARDIAC CATHETERIZATION N/A 03/25/2015   Procedure: Left Heart Cath and Coronary Angiography;  Surgeon: Leonie Man, MD;  Location: [place] [facility];  Service: Cardiovascular;  Laterality: N/A;  . CARDIAC CATHETERIZATION N/A 03/25/2015   Procedure: Coronary Stent Intervention;  Surgeon: Leonie Man, MD;  Location: [place] [facility];  Service: Cardiovascular;  Laterality: N/A;  . CHOLECYSTECTOMY    . COLONOSCOPY    . EYE SURGERY     cataracts remove, bilateral, w/IOL.  Marland Kitchen INSERTION OF DIALYSIS CATHETER N/A 03/09/2015   Procedure: INSERTION OF DIALYSIS CATHETER;  Surgeon: Conrad Duluth, MD;  Location: [facility];  Service: Vascular;  Laterality: N/A;  . LUMBAR LAMINECTOMY/DECOMPRESSION MICRODISCECTOMY Right 02/13/2014   Procedure: RIGHT LUMBAR FOUR-FIVE, RIGHT LUMBAR FIVE- SACRAL ONE LUMBAR LAMINECTOMY/DECOMPRESSION MICRODISCECTOMY ;  Surgeon:  Ashok Pall, MD;  Location: [facility];  Service: Neurosurgery;  Laterality: Right;  Right L4-5 and Right L5-S1 diskectomies  . TONSILLECTOMY       MEDICATIONS:  Prior to Admission medications   Medication Sig Start Date End Date Taking? Authorizing Provider  acetaminophen (TYLENOL) 500 MG tablet Take 500 mg by mouth every 6 (six) hours as needed for mild pain or moderate pain.   Yes Historical Provider, MD  ALPRAZolam Duanne Moron) 1 MG tablet Take 1 mg by mouth 2 (two) times daily as needed for anxiety.  12/21/14  Yes Historical Provider, MD  aspirin EC 81 MG tablet Take 81 mg by mouth every evening.     Yes Historical Provider, MD  atorvastatin (LIPITOR) 40 MG tablet Take 40 mg by mouth at bedtime.   Yes Historical Provider, MD  calcium carbonate (TUMS - DOSED IN MG ELEMENTAL CALCIUM) 500 MG chewable tablet Chew 1 tablet by mouth daily as needed for indigestion or heartburn.   Yes Historical Provider, MD  furosemide (LASIX) 40 MG tablet Take 80 mg by mouth See admin instructions. 80mg  on Tuesdays, Thursdays, Saturdays, and Sundays. Takes 40mg  on MWF (dialysis days)   Yes Historical Provider, MD  glipiZIDE (GLUCOTROL XL) 5 MG 24 hr tablet Take 1 tablet (5 mg total) by mouth daily before breakfast. Patient taking differently: Take 2.5 mg by mouth daily before breakfast.  02/15/15  Yes Thurnell Lose, MD  hydrALAZINE (APRESOLINE) 50 MG tablet Take 1.5 tablets (75 mg total) by mouth 3 (three) times daily. 08/24/15  Yes Herminio Commons, MD  HYDROcodone-acetaminophen (NORCO) 10-325 MG per tablet Take 1 tablet by mouth every 6 (six) hours as needed for severe pain.  02/10/15  Yes Historical Provider, MD  multivitamin (RENA-VIT) TABS tablet Take 1 tablet by mouth every evening.    Yes Historical Provider, MD  nitroGLYCERIN (NITROSTAT) 0.4 MG SL tablet Place 1 tablet (0.4 mg total) under the tongue every 5 (five) minutes as needed. Patient taking differently: Place 0.4 mg under the tongue every 5 (five) minutes as needed for chest pain.  03/23/15  Yes Herminio Commons, MD  NON FORMULARY Take 1 tablet by mouth daily as needed (for restful legs. **HYLANDS RESTFUL LEGS FORMULA*).   Yes Historical Provider, MD  ranitidine (ZANTAC) 150 MG tablet Take 150 mg by mouth daily as needed for heartburn.   Yes Historical Provider, MD  sodium bicarbonate 650 MG tablet Take 650 mg by mouth 3 (three) times daily.  01/09/15  Yes Historical Provider, MD  ticagrelor (BRILINTA) 90 MG TABS tablet Take 1 tablet (90 mg total) by mouth 2 (two) times daily. 03/26/15  Yes Almyra Deforest, PA  traZODone (DESYREL) 50 MG tablet Take 50-100  mg by mouth at bedtime as needed for sleep.    Yes Historical Provider, MD     ALLERGIES:  No Known Allergies   SOCIAL HISTORY:  Social History   Social History  . Marital status: Married    Spouse name: N/A  . Number of children: 2  . Years of education: HS   Occupational History  . Not on file.   Social History Main Topics  . Smoking status: Former Smoker    Packs/day: 0.50    Years: 40.00    Types: Cigarettes    Start date: 10/17/1973    Quit date: 02/07/2015  . Smokeless tobacco: Never Used  . Alcohol use No  . Drug use: No  . Sexual activity: Not on file  Other Topics Concern  . Not on file   Social History Narrative   Drinks 3 Diet sodas a day     The patient currently resides (home / rehab facility / nursing home): Home  The patient normally is (ambulatory / bedbound): Ambulatory   FAMILY HISTORY:  Family History  Problem Relation Age of Onset  . Adopted: Yes  . Prostate cancer Father   . Cancer Father     prostate  . Heart failure Maternal Grandmother   . Heart attack Maternal Grandfather   . Colon cancer Maternal Grandfather      REVIEW OF SYSTEMS:  Constitutional: fever/chills as per HPI; denies weight loss, or sweats  Eyes: denies any other vision changes, history of eye injury  ENT: denies sore throat, hearing problems  Respiratory: shortness of breath as per HPI, denies wheezing  Cardiovascular: denies chest pain, palpitations  Gastrointestinal: denies abdominal pain, N/V, or diarrhea Genitourinary: denies burning with urination or urinary frequency Musculoskeletal: denies any other joint pains or cramps  Skin: denies any other rashes or skin discolorations  Neurological: denies any other headache, dizziness, weakness  Psychiatric: denies any other depression, anxiety   All other review of systems were negative   VITAL SIGNS:  Temp:  [97.4 F (36.3 C)-98 F (36.7 C)] 98 F (36.7 C) (10/31 1905) Pulse Rate:  [67-89] 68 (10/31  1905) Resp:  [18-20] 18 (10/31 1905) BP: (157-177)/(72-103) 177/103 (10/31 1905) SpO2:  [95 %-99 %] 95 % (10/31 1905) Weight:  [122.5 kg (270 lb)] 122.5 kg (270 lb) (10/31 1115)     Height: 6' (182.9 cm) Weight: 122.5 kg (270 lb) BMI (Calculated): 36.7   INTAKE/OUTPUT:  This shift: No intake/output data recorded.  Last 2 shifts: @IOLAST2SHIFTS @   PHYSICAL EXAM:  Constitutional:  -- Overweight body habitus  -- Awake, alert, and oriented x3  Eyes:  -- Pupils equally round and reactive to light  -- No scleral icterus  Ear, nose, and throat:  -- No jugular venous distension  Pulmonary:  -- No crackles  -- Equal breath sounds bilaterally -- Breathing non-labored at rest Cardiovascular:  -- S1, S2 present  -- No pericardial rubs Gastrointestinal:  -- Abdomen soft, nontender, nondistended, no guarding/rebound  -- No abdominal masses appreciated, pulsatile or otherwise  Musculoskeletal and Integumentary:  -- Wounds or skin discoloration: None appreciated except tunneled Right IJ hemodialysis catheter with well-secured Right upper chest exit site without erythema or drainage -- Extremities: B/L UE and LE FROM, hands and feet warm, no edema  Neurologic:  -- Motor function: intact and symmetric -- Sensation: intact and symmetric  Pulse/Doppler Exam: (p=palpable; d=doppler signals; 0=none)     Right   Left   Brach  p   p   Rad  p   p  Labs:  CBC:  Lab Results  Component Value Date   WBC 5.9 04/04/2016   RBC 3.09 (L) 04/04/2016   BMP:  Lab Results  Component Value Date   GLUCOSE 150 (H) 04/04/2016   CO2 25 04/04/2016   BUN 33 (H) 04/04/2016   CREATININE 5.38 (H) 04/04/2016   CALCIUM 8.5 (L) 04/04/2016     Imaging studies:  Chest x-ray (04/04/2016) Stable mild cardiomegaly and chronic interstitial prominence. No acute findings.  Assessment/Plan: (ICD-10's: T80.211A) Hemodynamically stable 61 y.o. 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   - all risks, benefits, and alternatives to removal of tunneled Right IJ hemodialysis catheter discussed, and informed consent obtained and documented   - tunneled Right IJ hemodialysis catheter cuff bluntly and sharply dissected free from surrounding tissues, catheter removed, and direct pressure applied for hemostasis   - following 1 hour of post-catheter removal observation, patient can be discharged home as discussed with ED, hospitalist (Dr. Sarajane Jews), and nephrology (Dr. Lowanda Foster)  - patient instructed to follow-up in office tomorrow at 12:30 pm (prior to typical start time for afternoon office hours)  - will need temporary/non-tunneled hemodialysis catheter for dialysis this Thursday  - plan for reinsertion of tunneled hemodialysis catheter Friday, 11/3  - DVT prophylaxis  All of the above findings and recommendations were discussed with the patient and his medical/nephrology/ED teams, and all of patient's questions were answered to his expressed satisfaction.  Thank you for the opportunity to participate in this patient's care. Please call if any questions.  -- Marilynne Drivers Rosana Hoes, MD, [place]: [facility] Office: [phone]

## 2016-04-04 NOTE — Discharge Instructions (Signed)
You will be notified regarding appointment for temporary dialysis catheter placement prior to your next dialysis appointment.  You will receive antibiotics at your dialysis appointments.  Please return without fail for worsening symptoms, including fever, severe weakness or fatigue, difficulty breathing or any other symptoms concerning to you.

## 2016-04-06 ENCOUNTER — Other Ambulatory Visit: Payer: Self-pay | Admitting: Cardiovascular Disease

## 2016-04-06 ENCOUNTER — Observation Stay (HOSPITAL_COMMUNITY)
Admission: EM | Admit: 2016-04-06 | Discharge: 2016-04-07 | Disposition: A | Discharge status: Home / Self Care | Payer: Managed Care, Other (non HMO) | Attending: Internal Medicine | Admitting: Internal Medicine

## 2016-04-06 ENCOUNTER — Encounter (HOSPITAL_COMMUNITY): Payer: Self-pay

## 2016-04-06 ENCOUNTER — Encounter (HOSPITAL_COMMUNITY): Payer: Self-pay | Admitting: Emergency Medicine

## 2016-04-06 ENCOUNTER — Encounter (HOSPITAL_COMMUNITY)
Admission: RE | Admit: 2016-04-06 | Discharge: 2016-04-06 | Disposition: A | Discharge status: Home / Self Care | Payer: Managed Care, Other (non HMO) | Source: Ambulatory Visit | Attending: Surgery | Admitting: Surgery

## 2016-04-06 DIAGNOSIS — Z8249 Family history of ischemic heart disease and other diseases of the circulatory system: Secondary | ICD-10-CM | POA: Diagnosis not present

## 2016-04-06 DIAGNOSIS — F411 Generalized anxiety disorder: Secondary | ICD-10-CM | POA: Diagnosis not present

## 2016-04-06 DIAGNOSIS — Z7982 Long term (current) use of aspirin: Secondary | ICD-10-CM | POA: Insufficient documentation

## 2016-04-06 DIAGNOSIS — Z800 Family history of malignant neoplasm of digestive organs: Secondary | ICD-10-CM | POA: Insufficient documentation

## 2016-04-06 DIAGNOSIS — Z87891 Personal history of nicotine dependence: Secondary | ICD-10-CM | POA: Insufficient documentation

## 2016-04-06 DIAGNOSIS — D631 Anemia in chronic kidney disease: Secondary | ICD-10-CM | POA: Insufficient documentation

## 2016-04-06 DIAGNOSIS — M549 Dorsalgia, unspecified: Secondary | ICD-10-CM | POA: Insufficient documentation

## 2016-04-06 DIAGNOSIS — B957 Other staphylococcus as the cause of diseases classified elsewhere: Secondary | ICD-10-CM | POA: Insufficient documentation

## 2016-04-06 DIAGNOSIS — Z789 Other specified health status: Secondary | ICD-10-CM

## 2016-04-06 DIAGNOSIS — E7800 Pure hypercholesterolemia, unspecified: Secondary | ICD-10-CM | POA: Insufficient documentation

## 2016-04-06 DIAGNOSIS — I132 Hypertensive heart and chronic kidney disease with heart failure and with stage 5 chronic kidney disease, or end stage renal disease: Secondary | ICD-10-CM | POA: Insufficient documentation

## 2016-04-06 DIAGNOSIS — I255 Ischemic cardiomyopathy: Secondary | ICD-10-CM | POA: Diagnosis not present

## 2016-04-06 DIAGNOSIS — G4733 Obstructive sleep apnea (adult) (pediatric): Secondary | ICD-10-CM | POA: Diagnosis not present

## 2016-04-06 DIAGNOSIS — E669 Obesity, unspecified: Secondary | ICD-10-CM | POA: Insufficient documentation

## 2016-04-06 DIAGNOSIS — I2510 Atherosclerotic heart disease of native coronary artery without angina pectoris: Secondary | ICD-10-CM | POA: Insufficient documentation

## 2016-04-06 DIAGNOSIS — Z6837 Body mass index (BMI) 37.0-37.9, adult: Secondary | ICD-10-CM | POA: Insufficient documentation

## 2016-04-06 DIAGNOSIS — Z4901 Encounter for fitting and adjustment of extracorporeal dialysis catheter: Secondary | ICD-10-CM

## 2016-04-06 DIAGNOSIS — K219 Gastro-esophageal reflux disease without esophagitis: Secondary | ICD-10-CM | POA: Diagnosis not present

## 2016-04-06 DIAGNOSIS — E1122 Type 2 diabetes mellitus with diabetic chronic kidney disease: Principal | ICD-10-CM | POA: Insufficient documentation

## 2016-04-06 DIAGNOSIS — M797 Fibromyalgia: Secondary | ICD-10-CM | POA: Insufficient documentation

## 2016-04-06 DIAGNOSIS — G8929 Other chronic pain: Secondary | ICD-10-CM | POA: Insufficient documentation

## 2016-04-06 DIAGNOSIS — Z85828 Personal history of other malignant neoplasm of skin: Secondary | ICD-10-CM | POA: Insufficient documentation

## 2016-04-06 DIAGNOSIS — N186 End stage renal disease: Secondary | ICD-10-CM | POA: Insufficient documentation

## 2016-04-06 DIAGNOSIS — I5022 Chronic systolic (congestive) heart failure: Secondary | ICD-10-CM | POA: Diagnosis not present

## 2016-04-06 DIAGNOSIS — E1169 Type 2 diabetes mellitus with other specified complication: Secondary | ICD-10-CM | POA: Diagnosis present

## 2016-04-06 DIAGNOSIS — E875 Hyperkalemia: Secondary | ICD-10-CM | POA: Insufficient documentation

## 2016-04-06 DIAGNOSIS — Z8042 Family history of malignant neoplasm of prostate: Secondary | ICD-10-CM | POA: Insufficient documentation

## 2016-04-06 DIAGNOSIS — M1990 Unspecified osteoarthritis, unspecified site: Secondary | ICD-10-CM | POA: Diagnosis not present

## 2016-04-06 DIAGNOSIS — I429 Cardiomyopathy, unspecified: Secondary | ICD-10-CM

## 2016-04-06 DIAGNOSIS — F410 Panic disorder [episodic paroxysmal anxiety] without agoraphobia: Secondary | ICD-10-CM | POA: Diagnosis not present

## 2016-04-06 DIAGNOSIS — Z992 Dependence on renal dialysis: Secondary | ICD-10-CM

## 2016-04-06 DIAGNOSIS — I10 Essential (primary) hypertension: Secondary | ICD-10-CM

## 2016-04-06 DIAGNOSIS — I252 Old myocardial infarction: Secondary | ICD-10-CM | POA: Diagnosis not present

## 2016-04-06 DIAGNOSIS — Z7984 Long term (current) use of oral hypoglycemic drugs: Secondary | ICD-10-CM | POA: Insufficient documentation

## 2016-04-06 LAB — BASIC METABOLIC PANEL
Anion gap: 9 (ref 5–15)
BUN: 56 mg/dL — AB (ref 6–20)
CALCIUM: 9.1 mg/dL (ref 8.9–10.3)
CHLORIDE: 106 mmol/L (ref 101–111)
CO2: 22 mmol/L (ref 22–32)
CREATININE: 7.33 mg/dL — AB (ref 0.61–1.24)
GFR calc non Af Amer: 7 mL/min — ABNORMAL LOW (ref >60)
GFR, EST AFRICAN AMERICAN: 8 mL/min — AB (ref >60)
GLUCOSE: 91 mg/dL (ref 65–99)
Potassium: 5.6 mmol/L — ABNORMAL HIGH (ref 3.5–5.1)
Sodium: 137 mmol/L (ref 135–145)

## 2016-04-06 LAB — CBC WITH DIFFERENTIAL/PLATELET
Basophils Absolute: 0 10*3/uL (ref 0.0–0.1)
Basophils Relative: 1 %
Eosinophils Absolute: 0.3 10*3/uL (ref 0.0–0.7)
Eosinophils Relative: 4 %
HCT: 30.3 % — ABNORMAL LOW (ref 39.0–52.0)
Hemoglobin: 9.7 g/dL — ABNORMAL LOW (ref 13.0–17.0)
Lymphocytes Relative: 9 %
Lymphs Abs: 0.7 10*3/uL (ref 0.7–4.0)
MCH: 30.9 pg (ref 26.0–34.0)
MCHC: 32 g/dL (ref 30.0–36.0)
MCV: 96.5 fL (ref 78.0–100.0)
Monocytes Absolute: 0.4 10*3/uL (ref 0.1–1.0)
Monocytes Relative: 5 %
Neutro Abs: 6.2 10*3/uL (ref 1.7–7.7)
Neutrophils Relative %: 81 %
Platelets: 167 10*3/uL (ref 150–400)
RBC: 3.14 MIL/uL — ABNORMAL LOW (ref 4.22–5.81)
RDW: 15.3 % (ref 11.5–15.5)
WBC: 7.6 10*3/uL (ref 4.0–10.5)

## 2016-04-06 LAB — GLUCOSE, CAPILLARY: Glucose-Capillary: 119 mg/dL — ABNORMAL HIGH (ref 65–99)

## 2016-04-06 LAB — MRSA PCR SCREENING: MRSA by PCR: NEGATIVE

## 2016-04-06 MED ORDER — ATORVASTATIN CALCIUM 40 MG PO TABS
40.0000 mg | ORAL_TABLET | Freq: Every day | ORAL | Status: DC
  Administered 2016-04-06: 40 mg via ORAL
  Filled 2016-04-06: qty 1

## 2016-04-06 MED ORDER — TICAGRELOR 90 MG PO TABS
ORAL_TABLET | ORAL | Status: AC
  Filled 2016-04-06: qty 1

## 2016-04-06 MED ORDER — ALPRAZOLAM 1 MG PO TABS
1.0000 mg | ORAL_TABLET | Freq: Two times a day (BID) | ORAL | Status: DC | PRN
  Administered 2016-04-06 – 2016-04-07 (×2): 1 mg via ORAL
  Filled 2016-04-06: qty 2
  Filled 2016-04-06: qty 1

## 2016-04-06 MED ORDER — ALBUTEROL SULFATE (2.5 MG/3ML) 0.083% IN NEBU: 2.5000 mg | INHALATION_SOLUTION | RESPIRATORY_TRACT | Status: DC | PRN

## 2016-04-06 MED ORDER — FAMOTIDINE 20 MG PO TABS
20.0000 mg | ORAL_TABLET | Freq: Every day | ORAL | Status: DC
  Administered 2016-04-06: 20 mg via ORAL
  Filled 2016-04-06: qty 1

## 2016-04-06 MED ORDER — NITROGLYCERIN 0.4 MG SL SUBL: 0.4000 mg | SUBLINGUAL_TABLET | SUBLINGUAL | Status: DC | PRN

## 2016-04-06 MED ORDER — OXYCODONE-ACETAMINOPHEN 5-325 MG PO TABS
1.0000 | ORAL_TABLET | Freq: Once | ORAL | Status: AC
  Administered 2016-04-06: 1 via ORAL
  Filled 2016-04-06: qty 1

## 2016-04-06 MED ORDER — RENA-VITE PO TABS: 1.0000 | ORAL_TABLET | Freq: Every evening | ORAL | Status: DC

## 2016-04-06 MED ORDER — ACETAMINOPHEN 325 MG PO TABS: 650.0000 mg | ORAL_TABLET | Freq: Four times a day (QID) | ORAL | Status: DC | PRN

## 2016-04-06 MED ORDER — HEPARIN SODIUM (PORCINE) 5000 UNIT/ML IJ SOLN
5000.0000 [IU] | Freq: Three times a day (TID) | INTRAMUSCULAR | Status: DC
  Administered 2016-04-06 – 2016-04-07 (×2): 5000 [IU] via SUBCUTANEOUS
  Filled 2016-04-06 (×2): qty 1

## 2016-04-06 MED ORDER — TRAZODONE HCL 50 MG PO TABS: 50.0000 mg | ORAL_TABLET | Freq: Every evening | ORAL | Status: DC | PRN

## 2016-04-06 MED ORDER — ONDANSETRON HCL 4 MG PO TABS: 4.0000 mg | ORAL_TABLET | Freq: Four times a day (QID) | ORAL | Status: DC | PRN

## 2016-04-06 MED ORDER — ACETAMINOPHEN 650 MG RE SUPP: 650.0000 mg | Freq: Four times a day (QID) | RECTAL | Status: DC | PRN

## 2016-04-06 MED ORDER — CHLORHEXIDINE GLUCONATE CLOTH 2 % EX PADS
6.0000 | MEDICATED_PAD | Freq: Once | CUTANEOUS | Status: AC
  Administered 2016-04-07: 6 via TOPICAL

## 2016-04-06 MED ORDER — ALPRAZOLAM 0.5 MG PO TABS: 1.0000 mg | ORAL_TABLET | Freq: Once | ORAL | Status: DC

## 2016-04-06 MED ORDER — CALCIUM CARBONATE ANTACID 500 MG PO CHEW: 1.0000 | CHEWABLE_TABLET | Freq: Every day | ORAL | Status: DC | PRN

## 2016-04-06 MED ORDER — HYDROCODONE-ACETAMINOPHEN 10-325 MG PO TABS
1.0000 | ORAL_TABLET | Freq: Four times a day (QID) | ORAL | Status: DC | PRN
  Administered 2016-04-07 (×3): 1 via ORAL
  Filled 2016-04-06 (×3): qty 1

## 2016-04-06 MED ORDER — LORAZEPAM 1 MG PO TABS
1.0000 mg | ORAL_TABLET | Freq: Once | ORAL | Status: AC
  Administered 2016-04-06: 1 mg via ORAL
  Filled 2016-04-06: qty 1

## 2016-04-06 MED ORDER — HYDRALAZINE HCL 25 MG PO TABS
75.0000 mg | ORAL_TABLET | Freq: Three times a day (TID) | ORAL | Status: DC
  Administered 2016-04-06 – 2016-04-07 (×3): 75 mg via ORAL
  Filled 2016-04-06 (×3): qty 3

## 2016-04-06 MED ORDER — INSULIN ASPART 100 UNIT/ML ~~LOC~~ SOLN: 0.0000 [IU] | Freq: Three times a day (TID) | SUBCUTANEOUS | Status: DC

## 2016-04-06 MED ORDER — TICAGRELOR 90 MG PO TABS
90.0000 mg | ORAL_TABLET | Freq: Two times a day (BID) | ORAL | Status: DC
  Administered 2016-04-06: 90 mg via ORAL
  Filled 2016-04-06 (×8): qty 1

## 2016-04-06 MED ORDER — FUROSEMIDE 80 MG PO TABS: 80.0000 mg | ORAL_TABLET | ORAL | Status: DC

## 2016-04-06 MED ORDER — CHLORHEXIDINE GLUCONATE CLOTH 2 % EX PADS
6.0000 | MEDICATED_PAD | Freq: Once | CUTANEOUS | Status: AC
  Administered 2016-04-06: 6 via TOPICAL

## 2016-04-06 MED ORDER — DEXTROSE 5 % IV SOLN
3.0000 g | INTRAVENOUS | Status: DC
  Filled 2016-04-06: qty 3000

## 2016-04-06 MED ORDER — ONDANSETRON HCL 4 MG/2ML IJ SOLN: 4.0000 mg | Freq: Four times a day (QID) | INTRAMUSCULAR | Status: DC | PRN

## 2016-04-06 MED ORDER — ASPIRIN EC 81 MG PO TBEC: 81.0000 mg | DELAYED_RELEASE_TABLET | Freq: Every evening | ORAL | Status: DC

## 2016-04-06 MED ORDER — SODIUM BICARBONATE 650 MG PO TABS
650.0000 mg | ORAL_TABLET | Freq: Three times a day (TID) | ORAL | Status: DC
  Administered 2016-04-06: 650 mg via ORAL
  Filled 2016-04-06: qty 1

## 2016-04-06 NOTE — Consult Note (Signed)
SURGICAL CONSULTATION NOTE (initial) - cpt: [phone]  HISTORY OF PRESENT ILLNESS (HPI):  60 y.o. male with ESRD recently presented with short-interval recurrent bacteremia attributed to ongoing presence of tunneled Right IJ hemodialysis catheter, likely colonized with intraluminal staphylococcal biofilm. Patient has been treated with antibiotics while line-free the past few days and is scheduled to return tomorrow for re-insertion of tunneled hemodialysis catheter, followed by subsequent vein mapping for planning of non-catheter durable hemodialysis access. Meanwhile, however, patient requires hemodialysis today with k+ of 5.6. Patient denies any fever/chills, CP, palpitations, or SOB.  Surgery is consulted by ED physician Dr. Oleta Mouse and nephrologist Dr. Lowanda Foster in this context for evaluation and management of temporary hemodialysis access.  PAST MEDICAL HISTORY (PMH):  Past Medical History:  Diagnosis Date  . Anxiety    occas. panic attack, takes xanax occas  . Arthritis    herniated disc, lumbar   . Back pain   . CAD (coronary artery disease)    03/25/2015 95% prox to mid LAD treated with Promus Premier DES 3.5x39mm postdilated to 4.45mm, 100% prox to distal RCA lesion filled in the PDA region via collaterals from LAD and PLA  . Cancer ([place])    - skin ca on face- removed   . CHF (congestive heart failure) ([place])   . Chronic kidney disease   . Complication of anesthesia    " it takes a long time to get over it."   . Diabetes mellitus without complication ([place])    type 2  . Fibromyalgia   . GERD (gastroesophageal reflux disease)    otc- pepcid , approx. every other  day    . H/O acute respiratory failure   . Hematuria    being followed by Dr. Hinda Lenis for decreased kidney function   . History of blood product transfusion 02/13/2014   After having back surgery  . Hypercholesteremia   . Hypertension   . Myocardial infarction 03/2015  . Pneumonia    "walking"  . Shortness of breath  dyspnea   . Sleep apnea    test aborted, due to not able to relax , since the aborted test he had surgery for gallbladder & he reports that he was told that he has sleep apnea      PAST SURGICAL HISTORY ([place]):  Past Surgical History:  Procedure Laterality Date  . AV FISTULA PLACEMENT Left 02/02/2015   Procedure: LEFT ARM RADIOCEPHALIC ARTERIOVENOUS (AV) FISTULA CREATION;  Surgeon: Conrad Cross Anchor, MD;  Location: [facility];  Service: Vascular;  Laterality: Left;  . BACK SURGERY  02/13/14  . CARDIAC CATHETERIZATION N/A 03/25/2015   Procedure: Left Heart Cath and Coronary Angiography;  Surgeon: Leonie Man, MD;  Location: [facility];  Service: Cardiovascular;  Laterality: N/A;  . CARDIAC CATHETERIZATION N/A 03/25/2015   Procedure: Coronary Stent Intervention;  Surgeon: Leonie Man, MD;  Location: [facility];  Service: Cardiovascular;  Laterality: N/A;  . CHOLECYSTECTOMY    . COLONOSCOPY    . EYE SURGERY     cataracts remove, bilateral, w/IOL.  Marland Kitchen INSERTION OF DIALYSIS CATHETER N/A 03/09/2015   Procedure: INSERTION OF DIALYSIS CATHETER;  Surgeon: Conrad Clifford, MD;  Location: [facility];  Service: Vascular;  Laterality: N/A;  . LUMBAR LAMINECTOMY/DECOMPRESSION MICRODISCECTOMY Right 02/13/2014   Procedure: RIGHT LUMBAR FOUR-FIVE, RIGHT LUMBAR FIVE- SACRAL ONE LUMBAR LAMINECTOMY/DECOMPRESSION MICRODISCECTOMY ;  Surgeon: Ashok Pall, MD;  Location: [facility];  Service: Neurosurgery;  Laterality: Right;  Right L4-5 and Right L5-S1 diskectomies  .  TONSILLECTOMY       MEDICATIONS:  Prior to Admission medications   Medication Sig Start Date End Date Taking? Authorizing Provider  acetaminophen (TYLENOL) 500 MG tablet Take 500 mg by mouth every 6 (six) hours as needed for mild pain or moderate pain.    Historical Provider, MD  ALPRAZolam Duanne Moron) 1 MG tablet Take 1 mg by mouth 2 (two) times daily as needed for anxiety.  12/21/14   Historical Provider, MD  aspirin EC 81 MG tablet Take 81 mg  by mouth every evening.     Historical Provider, MD  atorvastatin (LIPITOR) 40 MG tablet Take 40 mg by mouth at bedtime.    Historical Provider, MD  calcium carbonate (TUMS - DOSED IN MG ELEMENTAL CALCIUM) 500 MG chewable tablet Chew 1 tablet by mouth daily as needed for indigestion or heartburn.    Historical Provider, MD  furosemide (LASIX) 40 MG tablet Take 80 mg by mouth See admin instructions. 80mg  on Tuesdays, Thursdays, Saturdays, and Sundays. Takes 40mg  on MWF (dialysis days)    Historical Provider, MD  glipiZIDE (GLUCOTROL XL) 5 MG 24 hr tablet Take 1 tablet (5 mg total) by mouth daily before breakfast. Patient taking differently: Take 2.5 mg by mouth daily before breakfast.  02/15/15   Thurnell Lose, MD  hydrALAZINE (APRESOLINE) 50 MG tablet TAKE 1 AND 1/2 TABLETS BY MOUTH THREE TIMES DAILY 04/06/16   Herminio Commons, MD  HYDROcodone-acetaminophen (NORCO) 10-325 MG per tablet Take 1 tablet by mouth every 6 (six) hours as needed for severe pain.  02/10/15   Historical Provider, MD  multivitamin (RENA-VIT) TABS tablet Take 1 tablet by mouth every evening.     Historical Provider, MD  nitroGLYCERIN (NITROSTAT) 0.4 MG SL tablet Place 1 tablet (0.4 mg total) under the tongue every 5 (five) minutes as needed. Patient taking differently: Place 0.4 mg under the tongue every 5 (five) minutes as needed for chest pain.  03/23/15   Herminio Commons, MD  NON FORMULARY Take 1 tablet by mouth daily as needed (for restful legs. **HYLANDS RESTFUL LEGS FORMULA*).    Historical Provider, MD  ranitidine (ZANTAC) 150 MG tablet Take 150 mg by mouth daily as needed for heartburn.    Historical Provider, MD  sodium bicarbonate 650 MG tablet Take 650 mg by mouth 3 (three) times daily.  01/09/15   Historical Provider, MD  ticagrelor (BRILINTA) 90 MG TABS tablet Take 1 tablet (90 mg total) by mouth 2 (two) times daily. 03/26/15   Almyra Deforest, PA  traZODone (DESYREL) 50 MG tablet Take 50-100 mg by mouth at bedtime as  needed for sleep.     Historical Provider, MD     ALLERGIES:  No Known Allergies   SOCIAL HISTORY:  Social History   Social History  . Marital status: Married    Spouse name: N/A  . Number of children: 2  . Years of education: HS   Occupational History  . Not on file.   Social History Main Topics  . Smoking status: Former Smoker    Packs/day: 0.50    Years: 40.00    Types: Cigarettes    Start date: 10/17/1973    Quit date: 02/07/2015  . Smokeless tobacco: Never Used  . Alcohol use No  . Drug use: No  . Sexual activity: Not on file   Other Topics Concern  . Not on file   Social History Narrative   Drinks 3 Diet sodas a day  The patient currently resides (home / rehab facility / nursing home): Home  The patient normally is (ambulatory / bedbound): Ambulatory   FAMILY HISTORY:  Family History  Problem Relation Age of Onset  . Adopted: Yes  . Prostate cancer Father   . Cancer Father     prostate  . Heart failure Maternal Grandmother   . Heart attack Maternal Grandfather   . Colon cancer Maternal Grandfather      REVIEW OF SYSTEMS:  Constitutional: denies weight loss, fever, chills, or sweats  Eyes: denies any other vision changes, history of eye injury  ENT: denies sore throat, hearing problems  Respiratory: denies shortness of breath, wheezing  Cardiovascular: denies chest pain, palpitations  Gastrointestinal: denies abdominal pain, N/V, or diarrhea Genitourinary: denies burning with urination or urinary frequency Musculoskeletal: denies any other joint pains or cramps  Skin: denies any other rashes or skin discolorations  Neurological: denies any other headache, dizziness, weakness  Psychiatric: denies any other depression, anxiety   All other review of systems were negative   VITAL SIGNS:  Temp:  [97.9 F (36.6 C)] 97.9 F (36.6 C) (11/02 1658) Pulse Rate:  [70] 70 (11/02 1658) Resp:  [18] 18 (11/02 1658) BP: (193)/(87) 193/87 (11/02 1658) SpO2:   [96 %] 96 % (11/02 1658) Weight:  [127 kg (280 lb)] 127 kg (280 lb) (11/02 1658)     Height: 6' (182.9 cm) Weight: 127 kg (280 lb) BMI (Calculated): 38.1   INTAKE/OUTPUT:  This shift: No intake/output data recorded.  Last 2 shifts: @IOLAST2SHIFTS @   PHYSICAL EXAM:  Constitutional:  -- Overweight body habitus  -- Awake, alert, and oriented x3  Eyes:  -- Pupils equally round and reactive to light  -- No scleral icterus  Ear, nose, and throat:  -- No jugular venous distension  Pulmonary:  -- No crackles  -- Equal breath sounds bilaterally -- Breathing non-labored at rest Cardiovascular:  -- S1, S2 present  -- No pericardial rubs Gastrointestinal:  -- Abdomen soft, nontender, nondistended, no guarding/rebound  -- No abdominal masses appreciated, pulsatile or otherwise  Musculoskeletal and Integumentary:  -- Wounds or skin discoloration: Former tunneled Right IJ hemodialysis catheter exit site NT and without erythema -- Extremities: B/L UE and LE FROM, hands and feet warm, no edema  Neurologic:  -- Motor function: intact and symmetric -- Sensation: intact and symmetric  Pulse/Doppler Exam: (p=palpable; d=doppler signals; 0=none)     Right   Left   Brach  p   p   Rad  p   p   Labs:  CBC:  Lab Results  Component Value Date   WBC 7.6 04/06/2016   RBC 3.14 (L) 04/06/2016   BMP:  Lab Results  Component Value Date   GLUCOSE 91 04/06/2016   CO2 22 04/06/2016   BUN 56 (H) 04/06/2016   CREATININE 7.33 (H) 04/06/2016   CALCIUM 9.1 04/06/2016     Imaging studies: No new pertinent imaging studies   Assessment/Plan: (ICD-10's: N18.6) 60 y.o. male with ESRD requiring acute/temporary non-tunneled hemodialysis access for hyperkalemia, complicated by pertinent comorbidities including DM, HTN, HLD, ESRD on HD x 1 year, CAD s/p PCI for MI, GERD, OSA, chronic back pain, generalized anxiety, and fibromyalgia..   - all risks, benefits, and alternatives to above procedure(s) were  discussed with the patient and his wife, who together elect to proceed, all of their questions were answered to their expressed satisfaction, and informed consent was obtained.   - will inserted non-tunneled femoral hemodialysis  catheter   - plan is for reinsertion of tunneled hemodialysis catheter tomorrow, to be followed by subsequent upper extremity vein mapping for creation/placement of non-catheter durable hemodialysis access  - medical management of co-morbidities as per medical and nephrology teams   - DVT prophylaxis  All of the above findings and recommendations were discussed with the patient and his family, and all of patient's and his family's questions were answered to their expressed satisfaction.  Thank you for the opportunity to participate in this patient's care.   -- Marilynne Drivers Rosana Hoes, MD, [place]: [facility] Office: [phone]

## 2016-04-06 NOTE — ED Notes (Signed)
EDP and Dr Rosana Hoes at the bedside, central line cart and supplies being gathered and consent in the room

## 2016-04-06 NOTE — Consult Note (Addendum)
Reason for Consult: Hyperkalemia and end-stage renal disease Referring Physician: [facility]  Mark [name] is an 60 y.o. male.  HPI: He is a patient with history of diabetes, coronary artery disease, history of cardiomyopathy and end-stage renal disease on maintenance hemodialysis. Patient recently developed fever, chills and dialysis. Blood culture was done and was found to have staph epi. Patient was given 3 weeks of vancomycin for possible hemodialysis catheter related infection. Within 3 days of completing his antibiotics patient came with weakness and  some chills. During that time blood cultures was done in dialysis unit and was started on vancomycin and ceftazidime empirically. Because of recurrent infection patient was sent to the emergency room for catheter removal. His catheter was removed on Tuesday and since ID suggested to leave the catheter out for at least 48 hours patient was sent home to be followed as an outpatient. Presently patient to schedule to have tunneled catheter to be placed tomorrow however when potassium was checked after I saw him in dialysis unit his potassium was 5.7 ? he is brought to treat his hyperkalemia so that he will have tunneled catheter placed tomorrow.  Past Medical History:  Diagnosis Date  . Anxiety    occas. panic attack, takes xanax occas  . Arthritis    herniated disc, lumbar   . Back pain   . CAD (coronary artery disease)    03/25/2015 95% prox to mid LAD treated with Promus Premier DES 3.5x39mm postdilated to 4.17mm, 100% prox to distal RCA lesion filled in the PDA region via collaterals from LAD and PLA  . Cancer ([place])    - skin ca on face- removed   . CHF (congestive heart failure) ([place])   . Chronic kidney disease   . Complication of anesthesia    " it takes a long time to get over it."   . Diabetes mellitus without complication ([place])    type 2  . Fibromyalgia   . GERD (gastroesophageal reflux disease)    otc- pepcid , approx.  every other  day    . H/O acute respiratory failure   . Hematuria    being followed by Dr. Hinda Lenis for decreased kidney function   . History of blood product transfusion 02/13/2014   After having back surgery  . Hypercholesteremia   . Hypertension   . Myocardial infarction 03/2015  . Pneumonia    "walking"  . Shortness of breath dyspnea   . Sleep apnea    test aborted, due to not able to relax , since the aborted test he had surgery for gallbladder & he reports that he was told that he has sleep apnea     Past Surgical History:  Procedure Laterality Date  . AV FISTULA PLACEMENT Left 02/02/2015   Procedure: LEFT ARM RADIOCEPHALIC ARTERIOVENOUS (AV) FISTULA CREATION;  Surgeon: Mark Mingo, MD;  Location: [facility];  Service: Vascular;  Laterality: Left;  . BACK SURGERY  02/13/14  . CARDIAC CATHETERIZATION N/A 03/25/2015   Procedure: Left Heart Cath and Coronary Angiography;  Surgeon: Mark Man, MD;  Location: [facility];  Service: Cardiovascular;  Laterality: N/A;  . CARDIAC CATHETERIZATION N/A 03/25/2015   Procedure: Coronary Stent Intervention;  Surgeon: Mark Man, MD;  Location: [facility];  Service: Cardiovascular;  Laterality: N/A;  . CHOLECYSTECTOMY    . COLONOSCOPY    . EYE SURGERY     cataracts remove, bilateral, w/IOL.  Marland Kitchen INSERTION OF DIALYSIS CATHETER N/A 03/09/2015  Procedure: INSERTION OF DIALYSIS CATHETER;  Surgeon: Mark Conway, MD;  Location: [facility];  Service: Vascular;  Laterality: N/A;  . LUMBAR LAMINECTOMY/DECOMPRESSION MICRODISCECTOMY Right 02/13/2014   Procedure: RIGHT LUMBAR FOUR-FIVE, RIGHT LUMBAR FIVE- SACRAL ONE LUMBAR LAMINECTOMY/DECOMPRESSION MICRODISCECTOMY ;  Surgeon: Mark Pall, MD;  Location: [facility];  Service: Neurosurgery;  Laterality: Right;  Right L4-5 and Right L5-S1 diskectomies  . TONSILLECTOMY      Family History  Problem Relation Age of Onset  . Adopted: Yes  . Prostate cancer Father   . Cancer Father      prostate  . Heart failure Maternal Grandmother   . Heart attack Maternal Grandfather   . Colon cancer Maternal Grandfather     Social History:  reports that he quit smoking about 13 months ago. His smoking use included Cigarettes. He started smoking about 42 years ago. He has a 20.00 pack-year smoking history. He has never used smokeless tobacco. He reports that he does not drink alcohol or use drugs.  Allergies: No Known Allergies  Medications: I have reviewed the patient's current medications.  No results found for this or any previous visit (from the past 48 hour(s)).  No results found.  Review of Systems  Constitutional: Positive for malaise/fatigue. Negative for chills and fever.  Respiratory: Negative for shortness of breath.   Gastrointestinal: Negative for nausea and vomiting.  Neurological: Negative.    Blood pressure 193/87, pulse 70, temperature 97.9 F (36.6 C), temperature source Oral, resp. rate 18, height 6' (1.829 m), weight 127 kg (280 lb), SpO2 96 %. Physical Exam  Assessment/Plan: Problem #1 hyperkalemia: This is secondary to lack of dialysis. His dialysis was on Monday. Problem #2 repeated staph epi infection. Presently patient is a febrile and he is on vancomycin. Echo was done and patient is scheduled for transesophageal echo. Problem #3 end-stage renal disease: His BUN and creatinine is high but patient is asymptomatic Problem #4 diabetes Problem #5 anemia: Presently patient is on Epogen Problem #6 fluid management: Patient does not have significant sinus fluid overload. Plan: 1] We'll dialyze patient for 3-1/2 hours using 2K/2.5 calcium was 2] will remove 8-6/1 L if systolic blood pressure above 90 3] will check renal panel in the morning 4] after tunneled catheter placement tomorrow if patient is stable could be discharged to be followed as an outpatient.  Mark [name] S 04/06/2016, 5:50 PM

## 2016-04-06 NOTE — ED Provider Notes (Signed)
[facility] DEPT Provider Note   CSN: [id number] Arrival date & time: 04/06/16  1648     History   Chief Complaint Chief Complaint  Patient presents with  . Vascular Access Problem    HPI Mark [name] is a 60 y.o. male.  HPI Presents for vascular Access for dialysis. History of end-stage renal disease on hemodialysis. He was seen recently in the ED for positive blood cultures and concern for infected dialysis catheter. Dialysis catheter was removed, and he was scheduled for temporary dialysis catheter placement by Dr. Rosana Hoes earlier today, but patient missed this appointment. He was sent to the ED for dialysis catheter placement as outpatient blood work showed potassium of 5.7 requiring dialysis today. He otherwise feels as if he is in his usual state of health. No shortness of breath, chest pain, fevers or chills. Past Medical History:  Diagnosis Date  . Anxiety    occas. panic attack, takes xanax occas  . Arthritis    herniated disc, lumbar   . Back pain   . CAD (coronary artery disease)    03/25/2015 95% prox to mid LAD treated with Promus Premier DES 3.5x77mm postdilated to 4.48mm, 100% prox to distal RCA lesion filled in the PDA region via collaterals from LAD and PLA  . Cancer ([place])    - skin ca on face- removed   . CHF (congestive heart failure) ([place])   . Chronic kidney disease   . Complication of anesthesia    " it takes a long time to get over it."   . Diabetes mellitus without complication ([place])    type 2  . Fibromyalgia   . GERD (gastroesophageal reflux disease)    otc- pepcid , approx. every other  day    . H/O acute respiratory failure   . Hematuria    being followed by Dr. Hinda Lenis for decreased kidney function   . History of blood product transfusion 02/13/2014   After having back surgery  . Hypercholesteremia   . Hypertension   . Myocardial infarction 03/2015  . Pneumonia    "walking"  . Shortness of breath dyspnea   . Sleep apnea    test aborted,  due to not able to relax , since the aborted test he had surgery for gallbladder & he reports that he was told that he has sleep apnea     Patient Active Problem List   Diagnosis Date Noted  . Bacteremia associated with intravascular line ([place]) 04/04/2016  . Abnormal stress test 03/25/2015  . Abnormal nuclear stress test   . Cardiomyopathy ([place])   . Chronic systolic heart failure ([place])   . ESRD (end stage renal disease) on dialysis ([place])   . Pre-operative cardiovascular examination   . Positive cardiac stress test 03/24/2015  . Essential hypertension   . Acute respiratory failure ([place]) 02/12/2015  . Renal failure   . Hyperkalemia   . Chronic kidney disease (CKD), stage IV (severe) ([place]) 01/22/2015  . HNP (herniated nucleus pulposus), lumbar 02/13/2014  . Abnormal ECG 02/12/2014  . HTN (hypertension) 02/12/2014  . Bradycardia 02/12/2014    Past Surgical History:  Procedure Laterality Date  . AV FISTULA PLACEMENT Left 02/02/2015   Procedure: LEFT ARM RADIOCEPHALIC ARTERIOVENOUS (AV) FISTULA CREATION;  Surgeon: Conrad Maury, MD;  Location: [facility];  Service: Vascular;  Laterality: Left;  . BACK SURGERY  02/13/14  . CARDIAC CATHETERIZATION N/A 03/25/2015   Procedure: Left Heart Cath and Coronary Angiography;  Surgeon: Leonie Man, MD;  Location:  [facility];  Service: Cardiovascular;  Laterality: N/A;  . CARDIAC CATHETERIZATION N/A 03/25/2015   Procedure: Coronary Stent Intervention;  Surgeon: Leonie Man, MD;  Location: [facility];  Service: Cardiovascular;  Laterality: N/A;  . CHOLECYSTECTOMY    . COLONOSCOPY    . EYE SURGERY     cataracts remove, bilateral, w/IOL.  Marland Kitchen INSERTION OF DIALYSIS CATHETER N/A 03/09/2015   Procedure: INSERTION OF DIALYSIS CATHETER;  Surgeon: Conrad Smyrna, MD;  Location: [facility];  Service: Vascular;  Laterality: N/A;  . LUMBAR LAMINECTOMY/DECOMPRESSION MICRODISCECTOMY Right 02/13/2014   Procedure: RIGHT LUMBAR FOUR-FIVE, RIGHT LUMBAR FIVE-  SACRAL ONE LUMBAR LAMINECTOMY/DECOMPRESSION MICRODISCECTOMY ;  Surgeon: Ashok Pall, MD;  Location: [facility];  Service: Neurosurgery;  Laterality: Right;  Right L4-5 and Right L5-S1 diskectomies  . TONSILLECTOMY         Home Medications    Prior to Admission medications   Medication Sig Start Date End Date Taking? Authorizing Provider  acetaminophen (TYLENOL) 500 MG tablet Take 500 mg by mouth every 6 (six) hours as needed for mild pain or moderate pain.    Historical Provider, MD  ALPRAZolam Duanne Moron) 1 MG tablet Take 1 mg by mouth 2 (two) times daily as needed for anxiety.  12/21/14   Historical Provider, MD  aspirin EC 81 MG tablet Take 81 mg by mouth every evening.     Historical Provider, MD  atorvastatin (LIPITOR) 40 MG tablet Take 40 mg by mouth at bedtime.    Historical Provider, MD  calcium carbonate (TUMS - DOSED IN MG ELEMENTAL CALCIUM) 500 MG chewable tablet Chew 1 tablet by mouth daily as needed for indigestion or heartburn.    Historical Provider, MD  furosemide (LASIX) 40 MG tablet Take 80 mg by mouth See admin instructions. 80mg  on Tuesdays, Thursdays, Saturdays, and Sundays. Takes 40mg  on MWF (dialysis days)    Historical Provider, MD  glipiZIDE (GLUCOTROL XL) 5 MG 24 hr tablet Take 1 tablet (5 mg total) by mouth daily before breakfast. Patient taking differently: Take 2.5 mg by mouth daily before breakfast.  02/15/15   Thurnell Lose, MD  hydrALAZINE (APRESOLINE) 50 MG tablet TAKE 1 AND 1/2 TABLETS BY MOUTH THREE TIMES DAILY 04/06/16   Herminio Commons, MD  HYDROcodone-acetaminophen (NORCO) 10-325 MG per tablet Take 1 tablet by mouth every 6 (six) hours as needed for severe pain.  02/10/15   Historical Provider, MD  multivitamin (RENA-VIT) TABS tablet Take 1 tablet by mouth every evening.     Historical Provider, MD  nitroGLYCERIN (NITROSTAT) 0.4 MG SL tablet Place 1 tablet (0.4 mg total) under the tongue every 5 (five) minutes as needed. Patient taking differently: Place  0.4 mg under the tongue every 5 (five) minutes as needed for chest pain.  03/23/15   Herminio Commons, MD  NON FORMULARY Take 1 tablet by mouth daily as needed (for restful legs. **HYLANDS RESTFUL LEGS FORMULA*).    Historical Provider, MD  ranitidine (ZANTAC) 150 MG tablet Take 150 mg by mouth daily as needed for heartburn.    Historical Provider, MD  sodium bicarbonate 650 MG tablet Take 650 mg by mouth 3 (three) times daily.  01/09/15   Historical Provider, MD  ticagrelor (BRILINTA) 90 MG TABS tablet Take 1 tablet (90 mg total) by mouth 2 (two) times daily. 03/26/15   Almyra Deforest, PA  traZODone (DESYREL) 50 MG tablet Take 50-100 mg by mouth at bedtime as needed for sleep.  Historical Provider, MD    Family History Family History  Problem Relation Age of Onset  . Adopted: Yes  . Prostate cancer Father   . Cancer Father     prostate  . Heart failure Maternal Grandmother   . Heart attack Maternal Grandfather   . Colon cancer Maternal Grandfather     Social History Social History  Substance Use Topics  . Smoking status: Former Smoker    Packs/day: 0.50    Years: 40.00    Types: Cigarettes    Start date: 10/17/1973    Quit date: 02/07/2015  . Smokeless tobacco: Never Used  . Alcohol use No     Allergies   Review of patient's allergies indicates no known allergies.   Review of Systems Review of Systems 10/14 systems reviewed and are negative other than those stated in the HPI   Physical Exam Updated Vital Signs BP 193/87 (BP Location: Right Arm)   Pulse 70   Temp 97.9 F (36.6 C) (Oral)   Resp 18   Ht 6' (1.829 m)   Wt 280 lb (127 kg)   SpO2 96%   BMI 37.97 kg/m   Physical Exam Physical Exam  Nursing note and vitals reviewed. Constitutional: Well developed, well nourished, non-toxic, and in no acute distress Head: Normocephalic and atraumatic.  Mouth/Throat: Oropharynx is clear and moist.  Neck: Normal range of motion. Neck supple.  Cardiovascular: Normal  rate and regular rhythm.   Pulmonary/Chest: Effort normal and breath sounds normal.  Abdominal: Soft. There is no tenderness. There is no rebound and no guarding.  Musculoskeletal: Normal range of motion.  Neurological: Alert, no facial droop, fluent speech, moves all extremities symmetrically Skin: Skin is warm and dry.  Psychiatric: Cooperative   ED Treatments / Results  Labs (all labs ordered are listed, but only abnormal results are displayed) Labs Reviewed  RENAL FUNCTION PANEL  CBC WITH DIFFERENTIAL/PLATELET  BASIC METABOLIC PANEL    EKG  EKG Interpretation None       Radiology No results found.  Procedures Procedures (including critical care time)  Medications Ordered in ED Medications  LORazepam (ATIVAN) tablet 1 mg (1 mg Oral Given 04/06/16 1927)  oxyCODONE-acetaminophen (PERCOCET/ROXICET) 5-325 MG per tablet 1 tablet (1 tablet Oral Given 04/06/16 1927)     Initial Impression / Assessment and Plan / ED Course  I have reviewed the triage vital signs and the nursing notes.  Pertinent labs & imaging results that were available during my care of the patient were reviewed by me and considered in my medical decision making (see chart for details).  Clinical Course     Presents for temporary dialysis catheter placement. Dr. Rosana Hoes present to perform procedure. Discussed with Dr. Lowanda Foster who will arrange dialysis tonight. Admit to Dr. Tamala Julian from hospitalist service for permanent dialysis catheter placement tomorrow morning as scheduled.   Final Clinical Impressions(s) / ED Diagnoses   Final diagnoses:  Problem with vascular access    New Prescriptions New Prescriptions   No medications on file     Forde Dandy, MD 04/06/16 2012

## 2016-04-06 NOTE — ED Triage Notes (Signed)
Dialysis nurse from [facility] calls and reports that Dr. Eulogio Ditch ran a potassium this am and it was 5.7 and was going to order kayexalate, But Dr. Rosana Hoes (surgeon) stated not to give kayexalate but to come to hospital to have a temporary Dialysis access done to have dialysis today and then has surgery scheduled for tomorrow for vascular access.

## 2016-04-06 NOTE — ED Notes (Signed)
Pt's wife at desk asking for more pain medication; Dr. Oleta Mouse informed and orders given; report given to [facility] on 300

## 2016-04-06 NOTE — H&P (Addendum)
History and Physical    Mark [name] VQQ:[id number] DOB: 1956/05/08 DOA: 04/06/2016  Referring MD/NP/PA: Dr. Oleta Mouse PCP: Deloria Lair, MD  Patient coming from: Home  Chief Complaint: Vascular access problem  HPI: Mark [name] is a 60 y.o. male with medical history significant of ESRD on HD( M/W/F), CHF, CAD, GERD; who presents with need of vascular access for hemodialysis. Patient is still able to make some urine. He had been found to have positive blood cultures with gram-positive cocci from samples taken on 10/27. On 10/30, patient had his regularly scheduled dialysis, but was referred to the ED for need of evaluation and repeat blood cultures. Workup overall was reassuring at that time, he was given 2 g of vancomycin IV, and the patient was discharged home to follow-up as an outpatient. However, on the following day the hemodialysis center called and directed the patient back to the emergency department on 10/31 for further evaluation. At that time, repeat cultures were drawn and he had  his dialysis catheter removed by Dr. Rosana Hoes of vascular surgery. Patient was again not admitted into the hospital because it was felt that he did not have significant symptoms,  leukocytosis, and was medically stable. Patient's wife notes that there were several miscommunications. Patient did not receive hemodialysis on Wednesday. He was scheduled to have a temporary dialysis catheter placed with Dr. Rosana Hoes earlier today, but patient missed this appointment. He was sent to the ED for temporary dialysis catheter placement as outpatient blood work showed he had a potassium of 5.7 requiring dialysis today. Patient is otherwise in his normal state of health. He reports some anxiousness and some mild shortness of breath.  Denies any chest pain, nausea, vomiting, palpitations, fever, chills. Patient wife also notes that he was supposed to have a sleep study, but it will likely need to be rescheduled.  ED Course: Upon  admission into the emergency department patient was evaluated by Dr. Kandyce Rud surgery and a temporary femoral dialysis catheter was placed. Patient was seen by Dr. Lowanda Foster in the ED and was setup for hemodialysis tonight. Dr. Rosana Hoes is scheduled to place a tunneled catheter for hemodialysis in the a.m. Lab work reveals hemoglobin 9.7, sodium 137, potassium 5.6, chloride 106, CO2 22, BUN 56, creatinine 7.33.  Review of Systems: As per HPI otherwise 10 point review of systems negative.   Past Medical History:  Diagnosis Date  . Anxiety    occas. panic attack, takes xanax occas  . Arthritis    herniated disc, lumbar   . Back pain   . CAD (coronary artery disease)    03/25/2015 95% prox to mid LAD treated with Promus Premier DES 3.5x69mm postdilated to 4.14mm, 100% prox to distal RCA lesion filled in the PDA region via collaterals from LAD and PLA  . Cancer ([place])    - skin ca on face- removed   . CHF (congestive heart failure) ([place])   . Chronic kidney disease   . Complication of anesthesia    " it takes a long time to get over it."   . Diabetes mellitus without complication ([place])    type 2  . Fibromyalgia   . GERD (gastroesophageal reflux disease)    otc- pepcid , approx. every other  day    . H/O acute respiratory failure   . Hematuria    being followed by Dr. Hinda Lenis for decreased kidney function   . History of blood product transfusion 02/13/2014   After having back surgery  . Hypercholesteremia   .  Hypertension   . Myocardial infarction 03/2015  . Pneumonia    "walking"  . Shortness of breath dyspnea   . Sleep apnea    test aborted, due to not able to relax , since the aborted test he had surgery for gallbladder & he reports that he was told that he has sleep apnea     Past Surgical History:  Procedure Laterality Date  . AV FISTULA PLACEMENT Left 02/02/2015   Procedure: LEFT ARM RADIOCEPHALIC ARTERIOVENOUS (AV) FISTULA CREATION;  Surgeon: Conrad [place], MD;  Location: [facility];  Service: Vascular;  Laterality: Left;  . BACK SURGERY  02/13/14  . CARDIAC CATHETERIZATION N/A 03/25/2015   Procedure: Left Heart Cath and Coronary Angiography;  Surgeon: Leonie Man, MD;  Location: [facility];  Service: Cardiovascular;  Laterality: N/A;  . CARDIAC CATHETERIZATION N/A 03/25/2015   Procedure: Coronary Stent Intervention;  Surgeon: Leonie Man, MD;  Location: [facility];  Service: Cardiovascular;  Laterality: N/A;  . CHOLECYSTECTOMY    . COLONOSCOPY    . EYE SURGERY     cataracts remove, bilateral, w/IOL.  Marland Kitchen INSERTION OF DIALYSIS CATHETER N/A 03/09/2015   Procedure: INSERTION OF DIALYSIS CATHETER;  Surgeon: Conrad [place], MD;  Location: [facility];  Service: Vascular;  Laterality: N/A;  . LUMBAR LAMINECTOMY/DECOMPRESSION MICRODISCECTOMY Right 02/13/2014   Procedure: RIGHT LUMBAR FOUR-FIVE, RIGHT LUMBAR FIVE- SACRAL ONE LUMBAR LAMINECTOMY/DECOMPRESSION MICRODISCECTOMY ;  Surgeon: Ashok Pall, MD;  Location: [facility];  Service: Neurosurgery;  Laterality: Right;  Right L4-5 and Right L5-S1 diskectomies  . TONSILLECTOMY       reports that he quit smoking about 13 months ago. His smoking use included Cigarettes. He started smoking about 42 years ago. He has a 20.00 pack-year smoking history. He has never used smokeless tobacco. He reports that he does not drink alcohol or use drugs.  No Known Allergies  Family History  Problem Relation Age of Onset  . Adopted: Yes  . Prostate cancer Father   . Cancer Father     prostate  . Heart failure Maternal Grandmother   . Heart attack Maternal Grandfather   . Colon cancer Maternal Grandfather     Prior to Admission medications   Medication Sig Start Date End Date Taking? Authorizing Provider  acetaminophen (TYLENOL) 500 MG tablet Take 500 mg by mouth every 6 (six) hours as needed for mild pain or moderate pain.    Historical Provider, MD  ALPRAZolam Duanne Moron) 1 MG tablet Take 1 mg by mouth 2 (two) times daily  as needed for anxiety.  12/21/14   Historical Provider, MD  aspirin EC 81 MG tablet Take 81 mg by mouth every evening.     Historical Provider, MD  atorvastatin (LIPITOR) 40 MG tablet Take 40 mg by mouth at bedtime.    Historical Provider, MD  calcium carbonate (TUMS - DOSED IN MG ELEMENTAL CALCIUM) 500 MG chewable tablet Chew 1 tablet by mouth daily as needed for indigestion or heartburn.    Historical Provider, MD  furosemide (LASIX) 40 MG tablet Take 80 mg by mouth See admin instructions. 80mg  on Tuesdays, Thursdays, Saturdays, and Sundays. Takes 40mg  on MWF (dialysis days)    Historical Provider, MD  glipiZIDE (GLUCOTROL XL) 5 MG 24 hr tablet Take 1 tablet (5 mg total) by mouth daily before breakfast. Patient taking differently: Take 2.5 mg by mouth daily before breakfast.  02/15/15   Thurnell Lose, MD  hydrALAZINE (APRESOLINE) 50 MG tablet  TAKE 1 AND 1/2 TABLETS BY MOUTH THREE TIMES DAILY 04/06/16   Herminio Commons, MD  HYDROcodone-acetaminophen (NORCO) 10-325 MG per tablet Take 1 tablet by mouth every 6 (six) hours as needed for severe pain.  02/10/15   Historical Provider, MD  multivitamin (RENA-VIT) TABS tablet Take 1 tablet by mouth every evening.     Historical Provider, MD  nitroGLYCERIN (NITROSTAT) 0.4 MG SL tablet Place 1 tablet (0.4 mg total) under the tongue every 5 (five) minutes as needed. Patient taking differently: Place 0.4 mg under the tongue every 5 (five) minutes as needed for chest pain.  03/23/15   Herminio Commons, MD  NON FORMULARY Take 1 tablet by mouth daily as needed (for restful legs. **HYLANDS RESTFUL LEGS FORMULA*).    Historical Provider, MD  ranitidine (ZANTAC) 150 MG tablet Take 150 mg by mouth daily as needed for heartburn.    Historical Provider, MD  sodium bicarbonate 650 MG tablet Take 650 mg by mouth 3 (three) times daily.  01/09/15   Historical Provider, MD  ticagrelor (BRILINTA) 90 MG TABS tablet Take 1 tablet (90 mg total) by mouth 2 (two) times daily.  03/26/15   Almyra Deforest, PA  traZODone (DESYREL) 50 MG tablet Take 50-100 mg by mouth at bedtime as needed for sleep.     Historical Provider, MD    Physical Exam:   Constitutional: Obese male that appears to be uncomfortable laying on his side  Vitals:   04/06/16 1658  BP: 193/87  Pulse: 70  Resp: 18  Temp: 97.9 F (36.6 C)  TempSrc: Oral  SpO2: 96%  Weight: 127 kg (280 lb)  Height: 6' (1.829 m)   Eyes: PERRL, lids and conjunctivae normal ENMT: Mucous membranes are moist. Posterior pharynx clear of any exudate or lesions.Normal dentition.  Neck: normal, supple, no masses, no thyromegaly Respiratory:Decreased overall air movement with some fine crackles appreciated bilaterally. Normal respiratory effort. No accessory muscle use.  Cardiovascular: Regular rate and rhythm, no murmurs / rubs / gallops. No extremity edema. 2+ pedal pulses. No carotid bruits.  Abdomen: no tenderness, no masses palpated. No hepatosplenomegaly. Bowel sounds positive.  Musculoskeletal: no clubbing / cyanosis. No joint deformity upper and lower extremities. Good ROM, no contractures. Normal muscle tone.  Skin: no rashes, lesions, ulcers. No induration Neurologic: CN 2-12 grossly intact. Sensation intact, DTR normal. Strength 5/5 in all 4.  Psychiatric: Normal judgment and insight. Alert and oriented x 3.Anxious  mood.     Labs on Admission: I have personally reviewed following labs and imaging studies  CBC:  Recent Labs Lab 03/31/16 1920 04/03/16 1619 04/04/16 1208  WBC 7.0 6.4 5.9  NEUTROABS 5.8 5.3 4.6  HGB 9.6* 9.5* 9.4*  HCT 30.9* 29.7* 30.1*  MCV 96.3 96.7 97.4  PLT 157 175 850   Basic Metabolic Panel:  Recent Labs Lab 03/31/16 1920 04/03/16 1619 04/04/16 1208  NA 139 135 137  K 4.2 3.9 4.3  CL 102 100* 104  CO2 26 27 25   GLUCOSE 143* 161* 150*  BUN 19 22* 33*  CREATININE 3.94* 3.80* 5.38*  CALCIUM 8.6* 8.3* 8.5*   GFR: Estimated Creatinine Clearance: 20.1 mL/min (by C-G  formula based on SCr of 5.38 mg/dL (H)). Liver Function Tests:  Recent Labs Lab 03/31/16 1920  AST 27  ALT 25  ALKPHOS 57  BILITOT 0.7  PROT 6.5  ALBUMIN 3.5   No results for input(s): LIPASE, AMYLASE in the last 168 hours. No results for input(s): AMMONIA in  the last 168 hours. Coagulation Profile: No results for input(s): INR, PROTIME in the last 168 hours. Cardiac Enzymes: No results for input(s): CKTOTAL, CKMB, CKMBINDEX, TROPONINI in the last 168 hours. BNP (last 3 results) No results for input(s): PROBNP in the last 8760 hours. HbA1C: No results for input(s): HGBA1C in the last 72 hours. CBG: No results for input(s): GLUCAP in the last 168 hours. Lipid Profile: No results for input(s): CHOL, HDL, LDLCALC, TRIG, CHOLHDL, LDLDIRECT in the last 72 hours. Thyroid Function Tests: No results for input(s): TSH, T4TOTAL, FREET4, T3FREE, THYROIDAB in the last 72 hours. Anemia Panel: No results for input(s): VITAMINB12, FOLATE, FERRITIN, TIBC, IRON, RETICCTPCT in the last 72 hours. Urine analysis:    Component Value Date/Time   COLORURINE YELLOW 04/04/2016 [place] 04/04/2016 1445   LABSPEC 1.015 04/04/2016 1445   PHURINE 7.0 04/04/2016 1445   GLUCOSEU 100 (A) 04/04/2016 1445   HGBUR NEGATIVE 04/04/2016 [place] 04/04/2016 [place] 04/04/2016 1445   PROTEINUR >300 (A) 04/04/2016 1445   NITRITE NEGATIVE 04/04/2016 1445   LEUKOCYTESUR NEGATIVE 04/04/2016 1445   Sepsis Labs: Recent Results (from the past 240 hour(s))  Blood culture (routine x 2)     Status: None (Preliminary result)   Collection Time: 04/03/16  4:19 PM  Result Value Ref Range Status   Specimen Description BLOOD RIGHT WRIST  Final   Special Requests BOTTLES DRAWN AEROBIC AND ANAEROBIC 6CC  Final   Culture NO GROWTH 3 DAYS  Final   Report Status PENDING  Incomplete  Blood culture (routine x 2)     Status: None (Preliminary result)   Collection Time:  04/03/16  5:16 PM  Result Value Ref Range Status   Specimen Description BLOOD RIGHT ARM  Final   Special Requests BOTTLES DRAWN AEROBIC AND ANAEROBIC 6CC  Final   Culture NO GROWTH 3 DAYS  Final   Report Status PENDING  Incomplete  Blood culture (routine x 2)     Status: None (Preliminary result)   Collection Time: 04/04/16 12:08 PM  Result Value Ref Range Status   Specimen Description BLOOD RIGHT ANTECUBITAL  Final   Special Requests BOTTLES DRAWN AEROBIC AND ANAEROBIC 10CC  Final   Culture NO GROWTH 2 DAYS  Final   Report Status PENDING  Incomplete  Blood culture (routine x 2)     Status: None (Preliminary result)   Collection Time: 04/04/16 12:12 PM  Result Value Ref Range Status   Specimen Description BLOOD RIGHT FOREARM  Final   Special Requests BOTTLES DRAWN AEROBIC AND ANAEROBIC 8CC EACH  Final   Culture NO GROWTH 2 DAYS  Final   Report Status PENDING  Incomplete  Aerobic Culture (superficial specimen)     Status: None (Preliminary result)   Collection Time: 04/04/16  4:14 PM  Result Value Ref Range Status   Specimen Description WOUND RIGHT CHEST  Final   Special Requests DIALYSIS PORT EXIT SITE  Final   Gram Stain NO WBC SEEN NO ORGANISMS SEEN   Final   Culture   Final    NO GROWTH 2 DAYS Performed at [facility]    Report Status PENDING  Incomplete     Radiological Exams on Admission: No results found.    Assessment/Plan  Hyperkalemia: Acute. Patient noted to have a potassium of 5.7 in outpatient setting after recent missed hemodialysis appointment. Recommended by his nephrologist Dr. Lowanda Foster come in to have vascular access place. - Admit  to telemetry bed - Patient scheduled to receive hemodialysis tonight - Check BMP  ESRD on HD/ need of Vascular access - Continue sodium bicarbonate - HD per nephrology - Dr. Rosana Hoes to place permacath in a.m.  Question of Bacteremia: Repeat blood cultures on the 10/30 and 10/31 showing no growth. Question if  patient previous blood culture was a contaminant.  - Continue to monitor   Essential hypertension - Continue hydralazine  CAD s/p stent/ ischemic cardiomyopathy - Continue Brilinta and aspirin  Diabetes mellitus type 2 - Hold glipizide - Hypoglycemic protocols - CBGs every before meals with sensitive sliding scale insulin  Anemia of chronic renal failure: Hemoglobin 9.7 and appears near patient's baseline - Continue to monitor  Anxiety  - Xanax prn anxiety   Hyperlipidemia - Continue atorvastatin  Jerrye Bushy - Pharmacy substitution of Pepcid for Zantac   DVT prophylaxis: Heparin Code Status: Full Family Communication: Discussed overall plan of care with the patient and his wife present at bedside Disposition Plan: Likely discharge home in a.m. Consults called:  nephrology and vascular surgery Admission status: observation telemetry  Norval Morton MD [facility] Pager [phone]  If 7PM-7AM, please contact night-coverage [email or web address] Password [facility]  04/06/2016, 7:33 PM

## 2016-04-06 NOTE — Procedures (Signed)
SURGICAL PROCEDURE REPORT  DATE OF PROCEDURE: 04/06/2016  ATTENDING: Corene Cornea E. Rosana Hoes, MD  ANESTHESIA: Local  PRE-PROCEDURE DIAGNOSIS: End-stage renal disease (N18.6)  POST-PROCEDURE DIAGNOSIS: End-stage renal disease (N18.6)  PROCEDURE(S): Insertion of Right femoral vein non-tunneled temporary hemodialysis catheter using ultrasound guidance (cpt: 77412)  INTRAOPERATIVE FINDINGS: Non-pulsatile venous blood return with clear visualization of venous access vessel on ultrasound   ESTIMATED BLOOD LOSS: Minimal (<20 mL)   SPECIMENS: None   IMPLANTS: Non-tunneled temporary hemodialysis catheter  DRAINS: None   COMPLICATIONS: None apparent   CONDITION AT COMPLETION: Unchanged from pre-procedure   DISPOSITION: Remaining in Emergency Department, awaiting hemodialysis   INDICATIONS FOR PROCEDURE: Patient is a 60 y.o. male with ESRD and recent short-interval recurrent bacteremia with chronic indwelling tunneled hemodialysis catheter, which was removed upon recognition of diagnosing recurrent bacteremia. He is scheduled for reinsertion of tunneled hemodialysis catheter tomorrow and first requires hemodialysis today for hyperkalemia. All risks, benefits, and alternatives to above elective procedure were discussed with the patient and his wife, who together elected to proceed, and informed consent was accordingly obtained at that time.   DETAILS OF PROCEDURE: Patient was appropriately identified, and informed consent was confirmed to be obtained and documented in the patient's chart. Patient was placed in supine position, and access site was prepped and draped in the usual sterile fashion using chlorhexidine. Access site was identified using direct ultrasound visualization, and 1% lidocaine was injected for local anesthesia. Using standard Seldinger technique, access needle was inserted into the selected access vein with negative pressure applied to the attached syringe, and dark non-pulsatile  venous blood was obtained. Loosely attached syringe was disconnected from the access needle, through which guidewire was advanced, over which access needle was withdrawn and dilator was advanced to skin level. A small skin incision was made adjacent to the dilator, allowing dilator to be advanced through subcutaneous tissues and withdrawn over secured guidewire with gentle pressure applied to the access site. Non-tunneled hemodialysis catheter, already flushed with sterile saline was then attempted to be advanced over the guidewire, but was unable to be advanced into the vein. Hemodialysis catheter was then withdrawn over guidewire, over which dilator was advanced through soft tissue, but was likewise unable to enter the vein and instead caused the guidewire to repeatedly kink. Entry needle was easily readvanced over the guidewire with appropriate dark non-pulsatile blood return. Stiff guidewire was then advanced through the entry needle, which was removed over the stiff guidewire, over which the dilator was advanced easily through subcutaneous tissues and into the common femoral vein. The dilator was then withdrawn over the stiff guidewire, over which hemodialysis catheter was inserted, confirmed to flush easily, and secured to the patient's skin with 2-0 silk suture. Site was assessed and confirmed to be soft without any bleeding, sterile dressing was applied, and all sharp instruments were appropriately disposed.  Patient tolerated the procedure well.

## 2016-04-06 NOTE — ED Notes (Signed)
Two lumen temporary hemodialysis catheter placed and sutured in place by Dr. Rosana Hoes. PT c/o chronic lower back pain and anxiety at time. New orders for meds.

## 2016-04-07 ENCOUNTER — Encounter (HOSPITAL_COMMUNITY)
Admission: EM | Disposition: A | Discharge status: Home / Self Care | Payer: Self-pay | Source: Home / Self Care | Attending: Emergency Medicine

## 2016-04-07 ENCOUNTER — Observation Stay (HOSPITAL_COMMUNITY): Payer: Managed Care, Other (non HMO) | Admitting: Anesthesiology

## 2016-04-07 ENCOUNTER — Encounter (HOSPITAL_COMMUNITY): Payer: Self-pay | Admitting: *Deleted

## 2016-04-07 ENCOUNTER — Observation Stay (HOSPITAL_COMMUNITY): Payer: Managed Care, Other (non HMO)

## 2016-04-07 ENCOUNTER — Ambulatory Visit (HOSPITAL_COMMUNITY): Admission: RE | Admit: 2016-04-07 | Payer: Managed Care, Other (non HMO) | Source: Ambulatory Visit | Admitting: Surgery

## 2016-04-07 DIAGNOSIS — E875 Hyperkalemia: Secondary | ICD-10-CM | POA: Diagnosis not present

## 2016-04-07 DIAGNOSIS — E1169 Type 2 diabetes mellitus with other specified complication: Secondary | ICD-10-CM | POA: Diagnosis not present

## 2016-04-07 DIAGNOSIS — N186 End stage renal disease: Secondary | ICD-10-CM | POA: Diagnosis not present

## 2016-04-07 DIAGNOSIS — E1122 Type 2 diabetes mellitus with diabetic chronic kidney disease: Secondary | ICD-10-CM | POA: Diagnosis not present

## 2016-04-07 DIAGNOSIS — I10 Essential (primary) hypertension: Secondary | ICD-10-CM | POA: Diagnosis not present

## 2016-04-07 HISTORY — PX: INSERTION OF DIALYSIS CATHETER: SHX1324

## 2016-04-07 LAB — AEROBIC CULTURE  (SUPERFICIAL SPECIMEN): GRAM STAIN: NONE SEEN

## 2016-04-07 LAB — GLUCOSE, CAPILLARY
GLUCOSE-CAPILLARY: 187 mg/dL — AB (ref 65–99)
GLUCOSE-CAPILLARY: 194 mg/dL — AB (ref 65–99)
Glucose-Capillary: 119 mg/dL — ABNORMAL HIGH (ref 65–99)

## 2016-04-07 LAB — SURGICAL PCR SCREEN
MRSA, PCR: NEGATIVE
Staphylococcus aureus: NEGATIVE

## 2016-04-07 LAB — POCT I-STAT, CHEM 8
BUN: 28 mg/dL — AB (ref 6–20)
CALCIUM ION: 1.11 mmol/L — AB (ref 1.15–1.40)
CHLORIDE: 101 mmol/L (ref 101–111)
CREATININE: 4.5 mg/dL — AB (ref 0.61–1.24)
GLUCOSE: 157 mg/dL — AB (ref 65–99)
HCT: 28 % — ABNORMAL LOW (ref 39.0–52.0)
Hemoglobin: 9.5 g/dL — ABNORMAL LOW (ref 13.0–17.0)
Potassium: 4.1 mmol/L (ref 3.5–5.1)
Sodium: 139 mmol/L (ref 135–145)
TCO2: 24 mmol/L (ref 0–100)

## 2016-04-07 LAB — AEROBIC CULTURE W GRAM STAIN (SUPERFICIAL SPECIMEN): Culture: NO GROWTH

## 2016-04-07 SURGERY — INSERTION OF DIALYSIS CATHETER
Anesthesia: Monitor Anesthesia Care | Laterality: Left

## 2016-04-07 MED ORDER — DEXTROSE 5 % IV SOLN
INTRAVENOUS | Status: DC | PRN
  Administered 2016-04-07: 3 g via INTRAVENOUS

## 2016-04-07 MED ORDER — GLYCOPYRROLATE 0.2 MG/ML IJ SOLN
0.2000 mg | Freq: Once | INTRAMUSCULAR | Status: AC | PRN
  Administered 2016-04-07: 0.2 mg via INTRAVENOUS

## 2016-04-07 MED ORDER — ALTEPLASE 2 MG IJ SOLR
2.0000 mg | Freq: Once | INTRAMUSCULAR | Status: DC | PRN
  Filled 2016-04-07: qty 2

## 2016-04-07 MED ORDER — CEFAZOLIN IN D5W 1 GM/50ML IV SOLN: 1.0000 g | INTRAVENOUS | Status: AC

## 2016-04-07 MED ORDER — LIDOCAINE HCL (PF) 1 % IJ SOLN
INTRAMUSCULAR | Status: AC
  Filled 2016-04-07: qty 30

## 2016-04-07 MED ORDER — HEPARIN SODIUM (PORCINE) 1000 UNIT/ML IJ SOLN
INTRAMUSCULAR | Status: AC
  Filled 2016-04-07: qty 1

## 2016-04-07 MED ORDER — HYDROMORPHONE HCL 1 MG/ML IJ SOLN
INTRAMUSCULAR | Status: AC
  Filled 2016-04-07: qty 0.5

## 2016-04-07 MED ORDER — GLYCOPYRROLATE 0.2 MG/ML IJ SOLN
INTRAMUSCULAR | Status: AC
  Filled 2016-04-07: qty 1

## 2016-04-07 MED ORDER — SODIUM CHLORIDE 0.9 % IV SOLN: 100.0000 mL | INTRAVENOUS | Status: DC | PRN

## 2016-04-07 MED ORDER — MIDAZOLAM HCL 2 MG/2ML IJ SOLN
INTRAMUSCULAR | Status: AC
  Filled 2016-04-07: qty 2

## 2016-04-07 MED ORDER — FENTANYL CITRATE (PF) 100 MCG/2ML IJ SOLN
INTRAMUSCULAR | Status: DC | PRN
  Administered 2016-04-07 (×4): 25 ug via INTRAVENOUS

## 2016-04-07 MED ORDER — PROPOFOL 10 MG/ML IV BOLUS
INTRAVENOUS | Status: DC | PRN
  Administered 2016-04-07: 200 mg via INTRAVENOUS

## 2016-04-07 MED ORDER — FENTANYL CITRATE (PF) 100 MCG/2ML IJ SOLN
25.0000 ug | INTRAMUSCULAR | Status: AC | PRN
  Administered 2016-04-07 (×2): 25 ug via INTRAVENOUS

## 2016-04-07 MED ORDER — LACTATED RINGERS IV SOLN: INTRAVENOUS | Status: DC

## 2016-04-07 MED ORDER — SODIUM CHLORIDE 0.9 % IV SOLN
INTRAVENOUS | Status: DC
  Administered 2016-04-07: 08:00:00 via INTRAVENOUS

## 2016-04-07 MED ORDER — HYDROMORPHONE HCL 1 MG/ML IJ SOLN
1.0000 mg | Freq: Once | INTRAMUSCULAR | Status: AC
  Administered 2016-04-07: 1 mg via INTRAVENOUS
  Filled 2016-04-07: qty 1

## 2016-04-07 MED ORDER — BUPIVACAINE HCL (PF) 0.5 % IJ SOLN
INTRAMUSCULAR | Status: AC
  Filled 2016-04-07: qty 30

## 2016-04-07 MED ORDER — CEFAZOLIN SODIUM-DEXTROSE 2-4 GM/100ML-% IV SOLN
INTRAVENOUS | Status: AC
  Filled 2016-04-07: qty 100

## 2016-04-07 MED ORDER — FENTANYL CITRATE (PF) 100 MCG/2ML IJ SOLN
INTRAMUSCULAR | Status: AC
  Filled 2016-04-07: qty 2

## 2016-04-07 MED ORDER — HEPARIN 1000 UNIT/ML FOR PERITONEAL DIALYSIS
INTRAMUSCULAR | Status: DC | PRN
  Administered 2016-04-07: 4 mL

## 2016-04-07 MED ORDER — MIDAZOLAM HCL 5 MG/5ML IJ SOLN
INTRAMUSCULAR | Status: DC | PRN
  Administered 2016-04-07 (×2): 0.5 mg via INTRAVENOUS
  Administered 2016-04-07: 1 mg via INTRAVENOUS
  Administered 2016-04-07: 0.5 mg via INTRAVENOUS
  Administered 2016-04-07: 1 mg via INTRAVENOUS
  Administered 2016-04-07: 0.5 mg via INTRAVENOUS

## 2016-04-07 MED ORDER — ALPRAZOLAM 0.5 MG PO TABS
1.0000 mg | ORAL_TABLET | Freq: Once | ORAL | Status: AC
  Administered 2016-04-07: 1 mg via ORAL
  Filled 2016-04-07: qty 2

## 2016-04-07 MED ORDER — SODIUM CHLORIDE 0.9 % IJ SOLN
INTRAMUSCULAR | Status: AC
Start: 2016-04-07 — End: 2016-04-07
  Filled 2016-04-07: qty 10

## 2016-04-07 MED ORDER — SODIUM CHLORIDE 0.9 % IV SOLN
100.0000 mL | INTRAVENOUS | Status: DC | PRN
  Administered 2016-04-07: 07:00:00 via INTRAVENOUS

## 2016-04-07 MED ORDER — SODIUM CHLORIDE 0.9 % IV SOLN
INTRAVENOUS | Status: DC | PRN
  Administered 2016-04-07: 500 mL

## 2016-04-07 MED ORDER — LIDOCAINE HCL (PF) 1 % IJ SOLN: 5.0000 mL | INTRAMUSCULAR | Status: DC | PRN

## 2016-04-07 MED ORDER — SODIUM CHLORIDE 0.9 % IR SOLN: Status: DC | PRN

## 2016-04-07 MED ORDER — MIDAZOLAM HCL 2 MG/2ML IJ SOLN
1.0000 mg | INTRAMUSCULAR | Status: DC | PRN
  Administered 2016-04-07: 2 mg via INTRAVENOUS

## 2016-04-07 MED ORDER — LIDOCAINE-PRILOCAINE 2.5-2.5 % EX CREA: 1.0000 "application " | TOPICAL_CREAM | CUTANEOUS | Status: DC | PRN

## 2016-04-07 MED ORDER — LIDOCAINE HCL 1 % IJ SOLN
INTRAMUSCULAR | Status: DC | PRN
  Administered 2016-04-07: 15 mL via INTRAMUSCULAR
  Administered 2016-04-07: 3 mL via INTRAMUSCULAR

## 2016-04-07 MED ORDER — HEPARIN SODIUM (PORCINE) 1000 UNIT/ML DIALYSIS
1000.0000 [IU] | INTRAMUSCULAR | Status: DC | PRN
  Filled 2016-04-07: qty 1

## 2016-04-07 MED ORDER — CEFAZOLIN IN D5W 1 GM/50ML IV SOLN
INTRAVENOUS | Status: AC
  Filled 2016-04-07: qty 50

## 2016-04-07 MED ORDER — PENTAFLUOROPROP-TETRAFLUOROETH EX AERO: 1.0000 "application " | INHALATION_SPRAY | CUTANEOUS | Status: DC | PRN

## 2016-04-07 MED ORDER — FUROSEMIDE 40 MG PO TABS: 40.0000 mg | ORAL_TABLET | ORAL | Status: DC

## 2016-04-07 MED ORDER — HYDROCODONE-ACETAMINOPHEN 10-325 MG PO TABS
1.0000 | ORAL_TABLET | Freq: Once | ORAL | Status: AC
  Administered 2016-04-07: 1 via ORAL

## 2016-04-07 MED ORDER — PROPOFOL 500 MG/50ML IV EMUL
INTRAVENOUS | Status: DC | PRN
  Administered 2016-04-07 (×2): via INTRAVENOUS
  Administered 2016-04-07: 40 ug/kg/min via INTRAVENOUS

## 2016-04-07 MED ORDER — CEFAZOLIN SODIUM-DEXTROSE 2-4 GM/100ML-% IV SOLN: 2.0000 g | INTRAVENOUS | Status: AC

## 2016-04-07 SURGICAL SUPPLY — 46 items
ADH SKN CLS APL DERMABOND .7 (GAUZE/BANDAGES/DRESSINGS) ×1
BAG DECANTER FOR FLEXI CONT (MISCELLANEOUS) ×2 IMPLANT
BAG HAMPER (MISCELLANEOUS) ×2 IMPLANT
BIOPATCH RED 1 DISK 7.0 (GAUZE/BANDAGES/DRESSINGS) ×2 IMPLANT
CATH PALINDROME-P 19CM W/VT (CATHETERS) ×3 IMPLANT
CATH PALINDROME-P 23CM W/VT (CATHETERS) ×1 IMPLANT
CHLORAPREP W/TINT 10.5 ML (MISCELLANEOUS) ×8 IMPLANT
DECANTER SPIKE VIAL GLASS SM (MISCELLANEOUS) ×4 IMPLANT
DERMABOND ADVANCED (GAUZE/BANDAGES/DRESSINGS) ×1
DERMABOND ADVANCED .7 DNX12 (GAUZE/BANDAGES/DRESSINGS) IMPLANT
DRAPE C-ARM FOLDED MOBILE STRL (DRAPES) ×5 IMPLANT
DRAPE CHEST BREAST 15X10 FENES (DRAPES) ×5 IMPLANT
DRAPE HALF SHEET 40X57 (DRAPES) ×3 IMPLANT
DRAPE UTILITY W/TAPE 26X15 (DRAPES) ×5 IMPLANT
DRSG SORBAVIEW 3.5X5-5/16 MED (GAUZE/BANDAGES/DRESSINGS) ×2 IMPLANT
GAUZE SPONGE 4X4 16PLY XRAY LF (GAUZE/BANDAGES/DRESSINGS) ×2 IMPLANT
GLIDEWIRE STIFF .35X180X3 HYDR (WIRE) ×2 IMPLANT
GLOVE BIOGEL PI IND STRL 7.0 (GLOVE) IMPLANT
GLOVE BIOGEL PI IND STRL 7.5 (GLOVE) ×1 IMPLANT
GLOVE BIOGEL PI INDICATOR 7.0 (GLOVE) ×3
GLOVE BIOGEL PI INDICATOR 7.5 (GLOVE) ×2
GLOVE ECLIPSE 6.5 STRL STRAW (GLOVE) ×2 IMPLANT
GLOVE ECLIPSE 7.0 STRL STRAW (GLOVE) ×3 IMPLANT
GLOVE EXAM NITRILE MD LF STRL (GLOVE) ×4 IMPLANT
GLOVE INDICATOR 7.5 STRL GRN (GLOVE) ×2 IMPLANT
GOWN STRL REUS W/TWL LRG LVL3 (GOWN DISPOSABLE) ×5 IMPLANT
GOWN STRL REUS W/TWL XL LVL3 (GOWN DISPOSABLE) ×2 IMPLANT
KIT BLADEGUARD II DBL (SET/KITS/TRAYS/PACK) ×2 IMPLANT
KIT ROOM TURNOVER APOR (KITS) ×2 IMPLANT
MARKER SKIN DUAL TIP RULER LAB (MISCELLANEOUS) ×2 IMPLANT
NDL HYPO 18GX1.5 BLUNT FILL (NEEDLE) ×1 IMPLANT
NDL HYPO 25X1 1.5 SAFETY (NEEDLE) ×1 IMPLANT
NEEDLE HYPO 18GX1.5 BLUNT FILL (NEEDLE) ×2 IMPLANT
NEEDLE HYPO 25X1 1.5 SAFETY (NEEDLE) ×2 IMPLANT
PACK BASIC III (CUSTOM PROCEDURE TRAY) ×2
PACK SRG BSC III STRL LF ECLPS (CUSTOM PROCEDURE TRAY) ×1 IMPLANT
PAD ARMBOARD 7.5X6 YLW CONV (MISCELLANEOUS) ×2 IMPLANT
PAD TELFA 3X4 1S STER (GAUZE/BANDAGES/DRESSINGS) ×1 IMPLANT
SET BASIN LINEN APH (SET/KITS/TRAYS/PACK) ×2 IMPLANT
SUT SILK 2 0 FSL 18 (SUTURE) ×2 IMPLANT
SUT VIC AB 4-0 PS2 27 (SUTURE) ×2 IMPLANT
SYR 20CC LL (SYRINGE) ×2 IMPLANT
SYR CONTROL 10ML LL (SYRINGE) ×2 IMPLANT
SYRINGE 10CC LL (SYRINGE) ×2 IMPLANT
TAPE CLOTH SURG 4X10 WHT LF (GAUZE/BANDAGES/DRESSINGS) ×1 IMPLANT
TOWEL OR 17X26 4PK STRL BLUE (TOWEL DISPOSABLE) ×2 IMPLANT

## 2016-04-07 NOTE — Anesthesia Procedure Notes (Signed)
Procedure Name: LMA Insertion Date/Time: 04/07/2016 10:00 AM Performed by: Charmaine Downs Pre-anesthesia Checklist: Patient identified, Timeout performed and Emergency Drugs available Patient Re-evaluated:Patient Re-evaluated prior to inductionOxygen Delivery Method: Circle System Utilized Preoxygenation: Pre-oxygenation with 100% oxygen Intubation Type: IV induction Ventilation: Mask ventilation without difficulty LMA: LMA inserted LMA Size: 4.0 Number of attempts: 2 Placement Confirmation: positive ETCO2 and breath sounds checked- equal and bilateral Tube secured with: Tape Dental Injury: Bloody posterior oropharynx  Comments: Unableto pass 5.0 LMA ;easy with 4.0 LMA

## 2016-04-07 NOTE — Discharge Summary (Signed)
Physician Discharge Summary  Mark [name] CWC:[id number] DOB: [birth year] DOA: 04/06/2016  PCP: Deloria Lair, MD  Admit date: 04/06/2016 Discharge date: 04/07/2016  Admitted From: home Disposition:  home  Recommendations for Outpatient Follow-up:  1. Follow up with PCP in 1-2 weeks 2. Follow up at dialysis center tomorrow as scheduled  Home Health: Equipment/Devices:  Discharge Condition:stable CODE STATUS: full Diet recommendation: Heart Healthy / Carb Modified   Brief/Interim Summary: Mark [name] is a 60 y.o. male with medical history significant of ESRD on HD( M/W/F), CHF, CAD, GERD; who presents with need of vascular access for hemodialysis. Patient is still able to make some urine. He had been found to have positive blood cultures with gram-positive cocci from samples taken on 10/27. On 10/30, patient had his regularly scheduled dialysis, but was referred to the ED for need of evaluation and repeat blood cultures. Workup overall was reassuring at that time, he was given 2 g of vancomycin IV, and the patient was discharged home to follow-up as an outpatient. However, on the following day the hemodialysis center called and directed the patient back to the emergency department on 10/31 for further evaluation. At that time, repeat cultures were drawn and he had  his dialysis catheter removed by Dr. Rosana Hoes of vascular surgery. Patient was again not admitted into the hospital because it was felt that he did not have significant symptoms,  leukocytosis, and was medically stable. Patient's wife notes that there were several miscommunications. Patient did not receive hemodialysis on Wednesday. He was scheduled to have a temporary dialysis catheter placed with Dr. Rosana Hoes earlier today, but patient missed this appointment. He was sent to the ED for temporary dialysis catheter placement as outpatient blood work showed he had a potassium of 5.7 requiring dialysis today. Patient is otherwise in his  normal state of health. He reports some anxiousness and some mild shortness of breath.  Denies any chest pain, nausea, vomiting, palpitations, fever, chills. Patient wife also notes that he was supposed to have a sleep study, but it will likely need to be rescheduled  Patient had temporary hemodialysis catheter placed by Dr. Rosana Hoes and underwent hemodialysis. Follow potassium showed that it was in normal range. Since blood cultures from 10/31 were still negative, he underwent catheter placement by Dr. Rosana Hoes without any immediate complications. Temporary catheter was then removed and patient was felt safe for discharge home. He will follow-up with nephrology tomorrow for another dialysis session. He is continued on antibiotics with dialysis for now. Per notes from Dr. Sarajane Jews on 11/3, arrangements are being made by cardiology to schedule TEE to determine total antibiotic course.  Discharge Diagnoses:  Principal Problem:   Hyperkalemia Active Problems:   Essential hypertension   Cardiomyopathy ([place])   ESRD (end stage renal disease) on dialysis (HCC)   Anxiety state   Diabetes mellitus type 2 in obese [facility])    Discharge Instructions  Discharge Instructions    Diet - low sodium heart healthy    Complete by:  As directed    Increase activity slowly    Complete by:  As directed        Medication List    TAKE these medications   acetaminophen 500 MG tablet Commonly known as:  TYLENOL Take 500 mg by mouth every 6 (six) hours as needed for mild pain or moderate pain.   ALPRAZolam 1 MG tablet Commonly known as:  XANAX Take 1 mg by mouth 2 (two) times daily as needed for anxiety.   aspirin  EC 81 MG tablet Take 81 mg by mouth every evening.   atorvastatin 40 MG tablet Commonly known as:  LIPITOR Take 40 mg by mouth at bedtime.   calcium carbonate 500 MG chewable tablet Commonly known as:  TUMS - dosed in mg elemental calcium Chew 1 tablet by mouth daily as needed for indigestion or  heartburn.   furosemide 40 MG tablet Commonly known as:  LASIX Take 80 mg by mouth See admin instructions. 80mg  on Tuesdays, Thursdays, Saturdays, and Sundays. Takes 40mg  on MWF (dialysis days)   glipiZIDE 5 MG 24 hr tablet Commonly known as:  GLUCOTROL XL Take 1 tablet (5 mg total) by mouth daily before breakfast. What changed:  how much to take   hydrALAZINE 50 MG tablet Commonly known as:  APRESOLINE TAKE 1 AND 1/2 TABLETS BY MOUTH THREE TIMES DAILY   HYDROcodone-acetaminophen 10-325 MG tablet Commonly known as:  NORCO Take 1 tablet by mouth every 6 (six) hours as needed for severe pain.   multivitamin Tabs tablet Take 1 tablet by mouth every evening.   nitroGLYCERIN 0.4 MG SL tablet Commonly known as:  NITROSTAT Place 1 tablet (0.4 mg total) under the tongue every 5 (five) minutes as needed. What changed:  reasons to take this   NON FORMULARY Take 1 tablet by mouth daily as needed (for restful legs. **HYLANDS RESTFUL LEGS FORMULA*).   ranitidine 150 MG tablet Commonly known as:  ZANTAC Take 150 mg by mouth daily as needed for heartburn.   sodium bicarbonate 650 MG tablet Take 650 mg by mouth 3 (three) times daily.   ticagrelor 90 MG Tabs tablet Commonly known as:  BRILINTA Take 1 tablet (90 mg total) by mouth 2 (two) times daily.   traZODone 50 MG tablet Commonly known as:  DESYREL Take 50-100 mg by mouth at bedtime as needed for sleep.       No Known Allergies  Consultations:  Nephrology  General surgery   Procedures/Studies: Dg Chest 2 View  Result Date: 04/04/2016 CLINICAL DATA:  Nonproductive cough and orthopnea for 4 weeks. Fatigue. Chronic kidney disease on dialysis. EXAM: CHEST  2 VIEW COMPARISON:  03/31/2016 FINDINGS: Stable mild cardiomegaly. Stable mild diffuse pulmonary interstitial prominence. No evidence of acute infiltrate or pleural effusion. Right jugular dual-lumen central venous dialysis catheter remains in appropriate position.  IMPRESSION: Stable mild cardiomegaly and chronic interstitial prominence. No acute findings. Electronically Signed   By: Earle Gell M.D.   On: 04/04/2016 12:33   Dg Chest 2 View  Result Date: 03/31/2016 CLINICAL DATA:  Exertional dyspnea and dry cough EXAM: CHEST  2 VIEW COMPARISON:  03/09/2015 FINDINGS: Right-sided vascular catheter with tips overlying the SVC. Mild cardiomegaly. Tiny bilateral effusions. Mild central vascular congestion. Perihilar interstitial opacities likely reflect mild edema. No pneumothorax. IMPRESSION: 1. Mild cardiomegaly with central vascular congestion and mild perihilar interstitial opacities suggestive of edema. Tiny bilateral effusions. Electronically Signed   By: Donavan Foil M.D.   On: 03/31/2016 20:34   US Guided Needle Placement  Result Date: 04/07/2016 CLINICAL DATA:  Ultrasound was provided for use by the ordering physician, and a technical charge was applied by the performing facility.  No radiologist interpretation/professional services rendered.   [place] Intraoperative  Result Date: 04/07/2016 CLINICAL DATA:  Ultrasound was provided for use by the ordering physician, and a technical charge was applied by the performing facility.  No radiologist interpretation/professional services rendered.   Dg Chest Port 1 View  Result Date: 04/07/2016 CLINICAL DATA:  Left  side dialysis catheter placement. EXAM: PORTABLE CHEST 1 VIEW COMPARISON:  04/04/2016 FINDINGS: Left dialysis catheter has been placed with the tip at the confluence of the innominate veins, directed laterally toward the lateral wall of the upper SVC. No pneumothorax. Cardiomegaly with vascular congestion and possible mild interstitial edema. No effusions or acute bony abnormality. IMPRESSION: Left dialysis catheter tip near the confluence of the innominate veins directed laterally. No pneumothorax. Cardiomegaly with vascular congestion and possible mild interstitial edema. Electronically Signed   By: Rolm Baptise M.D.   On: 04/07/2016 12:10   Dg C-arm Gt 120 Min-no Report  Result Date: 04/07/2016 CLINICAL DATA: end stage renal disease C-ARM GT 120 MINUTE Fluoroscopy was utilized by the requesting physician.  No radiographic interpretation.       Subjective: No shortness of breath or chest pain  Discharge Exam: Vitals:   04/07/16 1145 04/07/16 1226  BP: (!) 144/117 (!) 168/87  Pulse:    Resp: (!) 23 18  Temp:  98.1 F (36.7 C)   Vitals:   04/07/16 1115 04/07/16 1130 04/07/16 1145 04/07/16 1226  BP: (!) 141/74 (!) 145/87 (!) 144/117 (!) 168/87  Pulse: 79 63    Resp: (!) 25 15 (!) 23 18  Temp:    98.1 F (36.7 C)  TempSrc:    Oral  SpO2: 100% 95%  97%  Weight:      Height:        General: Pt is alert, awake, not in acute distress Cardiovascular: RRR, S1/S2 +, no rubs, no gallops Respiratory: CTA bilaterally, no wheezing, no rhonchi Abdominal: Soft, NT, ND, bowel sounds + Extremities: no edema, no cyanosis    The results of significant diagnostics from this hospitalization (including imaging, microbiology, ancillary and laboratory) are listed below for reference.     Microbiology: Recent Results (from the past 240 hour(s))  Blood culture (routine x 2)     Status: None (Preliminary result)   Collection Time: 04/03/16  4:19 PM  Result Value Ref Range Status   Specimen Description BLOOD RIGHT WRIST  Final   Special Requests BOTTLES DRAWN AEROBIC AND ANAEROBIC 6CC  Final   Culture NO GROWTH 4 DAYS  Final   Report Status PENDING  Incomplete  Blood culture (routine x 2)     Status: None (Preliminary result)   Collection Time: 04/03/16  5:16 PM  Result Value Ref Range Status   Specimen Description BLOOD RIGHT ARM  Final   Special Requests BOTTLES DRAWN AEROBIC AND ANAEROBIC 6CC  Final   Culture NO GROWTH 4 DAYS  Final   Report Status PENDING  Incomplete  Blood culture (routine x 2)     Status: None (Preliminary result)   Collection Time: 04/04/16 12:08 PM  Result  Value Ref Range Status   Specimen Description BLOOD RIGHT ANTECUBITAL  Final   Special Requests BOTTLES DRAWN AEROBIC AND ANAEROBIC 10CC  Final   Culture NO GROWTH 3 DAYS  Final   Report Status PENDING  Incomplete  Blood culture (routine x 2)     Status: None (Preliminary result)   Collection Time: 04/04/16 12:12 PM  Result Value Ref Range Status   Specimen Description BLOOD RIGHT FOREARM  Final   Special Requests BOTTLES DRAWN AEROBIC AND ANAEROBIC 8CC EACH  Final   Culture NO GROWTH 3 DAYS  Final   Report Status PENDING  Incomplete  Aerobic Culture (superficial specimen)     Status: None   Collection Time: 04/04/16  4:14 PM  Result Value  Ref Range Status   Specimen Description WOUND RIGHT CHEST  Final   Special Requests DIALYSIS PORT EXIT SITE  Final   Gram Stain NO WBC SEEN NO ORGANISMS SEEN   Final   Culture   Final    NO GROWTH 2 DAYS Performed at [facility]    Report Status 04/07/2016 FINAL  Final  MRSA PCR Screening     Status: None   Collection Time: 04/06/16  9:07 PM  Result Value Ref Range Status   MRSA by PCR NEGATIVE NEGATIVE Final    Comment:        The GeneXpert MRSA Assay (FDA approved for NASAL specimens only), is one component of a comprehensive MRSA colonization surveillance program. It is not intended to diagnose MRSA infection nor to guide or monitor treatment for MRSA infections.   Surgical pcr screen     Status: None   Collection Time: 04/06/16 11:20 PM  Result Value Ref Range Status   MRSA, PCR NEGATIVE NEGATIVE Final   Staphylococcus aureus NEGATIVE NEGATIVE Final    Comment:        The Xpert SA Assay (FDA approved for NASAL specimens in patients over 69 years of age), is one component of a comprehensive surveillance program.  Test performance has been validated by [facility] for patients greater than or equal to 91 year old. It is not intended to diagnose infection nor to guide or monitor treatment.      Labs: BNP (last  3 results)  Recent Labs  04/04/16 1208  BNP 324.4*   Basic Metabolic Panel:  Recent Labs Lab 03/31/16 1920 04/03/16 1619 04/04/16 1208 04/06/16 1946 04/07/16 0721  NA 139 135 137 137 139  K 4.2 3.9 4.3 5.6* 4.1  CL 102 100* 104 106 101  CO2 26 27 25 22   --   GLUCOSE 143* 161* 150* 91 157*  BUN 19 22* 33* 56* 28*  CREATININE 3.94* 3.80* 5.38* 7.33* 4.50*  CALCIUM 8.6* 8.3* 8.5* 9.1  --    Liver Function Tests:  Recent Labs Lab 03/31/16 1920  AST 27  ALT 25  ALKPHOS 57  BILITOT 0.7  PROT 6.5  ALBUMIN 3.5   No results for input(s): LIPASE, AMYLASE in the last 168 hours. No results for input(s): AMMONIA in the last 168 hours. CBC:  Recent Labs Lab 03/31/16 1920 04/03/16 1619 04/04/16 1208 04/06/16 1946 04/07/16 0721  WBC 7.0 6.4 5.9 7.6  --   NEUTROABS 5.8 5.3 4.6 6.2  --   HGB 9.6* 9.5* 9.4* 9.7* 9.5*  HCT 30.9* 29.7* 30.1* 30.3* 28.0*  MCV 96.3 96.7 97.4 96.5  --   PLT 157 175 161 167  --    Cardiac Enzymes: No results for input(s): CKTOTAL, CKMB, CKMBINDEX, TROPONINI in the last 168 hours. BNP: Invalid input(s): POCBNP CBG:  Recent Labs Lab 04/06/16 2114 04/07/16 0639 04/07/16 1053 04/07/16 1642  GLUCAP 119* 187* 119* 194*   D-Dimer No results for input(s): DDIMER in the last 72 hours. Hgb A1c No results for input(s): HGBA1C in the last 72 hours. Lipid Profile No results for input(s): CHOL, HDL, LDLCALC, TRIG, CHOLHDL, LDLDIRECT in the last 72 hours. Thyroid function studies No results for input(s): TSH, T4TOTAL, T3FREE, THYROIDAB in the last 72 hours.  Invalid input(s): FREET3 Anemia work up No results for input(s): VITAMINB12, FOLATE, FERRITIN, TIBC, IRON, RETICCTPCT in the last 72 hours. Urinalysis    Component Value Date/Time   COLORURINE YELLOW 04/04/2016 [place]  04/04/2016 1445   LABSPEC 1.015 04/04/2016 1445   PHURINE 7.0 04/04/2016 1445   GLUCOSEU 100 (A) 04/04/2016 1445   HGBUR NEGATIVE 04/04/2016 [place] 04/04/2016 1445   KETONESUR NEGATIVE 04/04/2016 1445   PROTEINUR >300 (A) 04/04/2016 1445   NITRITE NEGATIVE 04/04/2016 1445   LEUKOCYTESUR NEGATIVE 04/04/2016 1445   Sepsis Labs Invalid input(s): PROCALCITONIN,  WBC,  LACTICIDVEN Microbiology Recent Results (from the past 240 hour(s))  Blood culture (routine x 2)     Status: None (Preliminary result)   Collection Time: 04/03/16  4:19 PM  Result Value Ref Range Status   Specimen Description BLOOD RIGHT WRIST  Final   Special Requests BOTTLES DRAWN AEROBIC AND ANAEROBIC 6CC  Final   Culture NO GROWTH 4 DAYS  Final   Report Status PENDING  Incomplete  Blood culture (routine x 2)     Status: None (Preliminary result)   Collection Time: 04/03/16  5:16 PM  Result Value Ref Range Status   Specimen Description BLOOD RIGHT ARM  Final   Special Requests BOTTLES DRAWN AEROBIC AND ANAEROBIC 6CC  Final   Culture NO GROWTH 4 DAYS  Final   Report Status PENDING  Incomplete  Blood culture (routine x 2)     Status: None (Preliminary result)   Collection Time: 04/04/16 12:08 PM  Result Value Ref Range Status   Specimen Description BLOOD RIGHT ANTECUBITAL  Final   Special Requests BOTTLES DRAWN AEROBIC AND ANAEROBIC 10CC  Final   Culture NO GROWTH 3 DAYS  Final   Report Status PENDING  Incomplete  Blood culture (routine x 2)     Status: None (Preliminary result)   Collection Time: 04/04/16 12:12 PM  Result Value Ref Range Status   Specimen Description BLOOD RIGHT FOREARM  Final   Special Requests BOTTLES DRAWN AEROBIC AND ANAEROBIC 8CC EACH  Final   Culture NO GROWTH 3 DAYS  Final   Report Status PENDING  Incomplete  Aerobic Culture (superficial specimen)     Status: None   Collection Time: 04/04/16  4:14 PM  Result Value Ref Range Status   Specimen Description WOUND RIGHT CHEST  Final   Special Requests DIALYSIS PORT EXIT SITE  Final   Gram Stain NO WBC SEEN NO ORGANISMS SEEN   Final   Culture   Final    NO  GROWTH 2 DAYS Performed at [facility]    Report Status 04/07/2016 FINAL  Final  MRSA PCR Screening     Status: None   Collection Time: 04/06/16  9:07 PM  Result Value Ref Range Status   MRSA by PCR NEGATIVE NEGATIVE Final    Comment:        The GeneXpert MRSA Assay (FDA approved for NASAL specimens only), is one component of a comprehensive MRSA colonization surveillance program. It is not intended to diagnose MRSA infection nor to guide or monitor treatment for MRSA infections.   Surgical pcr screen     Status: None   Collection Time: 04/06/16 11:20 PM  Result Value Ref Range Status   MRSA, PCR NEGATIVE NEGATIVE Final   Staphylococcus aureus NEGATIVE NEGATIVE Final    Comment:        The Xpert SA Assay (FDA approved for NASAL specimens in patients over 56 years of age), is one component of a comprehensive surveillance program.  Test performance has been validated by [facility] for patients greater than or equal to 48 year old. It is not intended to diagnose  infection nor to guide or monitor treatment.      Time coordinating discharge: Over 30 minutes  SIGNED:   Kathie Dike, MD  [facility] 04/07/2016, 5:16 PM Pager   If 7PM-7AM, please contact night-coverage [email or web address] Password [email or web address]

## 2016-04-07 NOTE — Discharge Planning (Signed)
Pt d/c home, vitals stable, no c/o pain, discharge paperwork sent with pt, no questions for nurse

## 2016-04-07 NOTE — Op Note (Signed)
SURGICAL PROCEDURE REPORT  DATE OF PROCEDURE: 04/07/2016   ATTENDING SURGEON: Corene Cornea E. Rosana Hoes, MD   ANESTHESIA: Local with IV sedation, converted to general with LMA due to patient's inability to tolerate the procedure otherwise  PRE-OPERATIVE DIAGNOSIS: ESRD requiring functional hemodialysis access, chronic back pain, and generalized anxiety with panic disorder  (icd-10: N18.6)  POST-OPERATIVE DIAGNOSIS: ESRD requiring functional hemodialysis access, chronic back pain, and generalized anxiety with panic disorder  (icd-10: N18.6)  PROCEDURE(S):  1.) Percutaneous access of Right internal jugular vein under ultrasound guidance 2.) Percutaneous access of Left internal jugular vein under ultrasound guidance 3.) Insertion of Left internal jugular cuffed tunneled Covidien Palendrome hemodialysis catheter (cpt: 73710)  INTRAOPERATIVE FINDINGS: Patent easily compressible Right internal jugular vein with distal IJ intraluminal thrombosis possibly extending into the Left innominate vein, able to be traversed with guidewire and stiff dilators, but unable to traverse catheter across this stenosis; patent easily compressible Left internal jugular vein with normal respiratory variation; well-secured cuffed tunneled Left internal jugular hemodialysis catheter at completion of the procedure  INTRAOPERATIVE FLUIDS: 600 mL crystalloid, 0 mL contrast used   FLUOROSCOPY: 5.3 minutes   ESTIMATED BLOOD LOSS: Minimal (<20 mL)   SPECIMENS: None   IMPLANTS: 23 cm 14.61F cuffed tunneled Covidien Palendrome hemodialysis catheter  DRAINS: None   COMPLICATIONS: None apparent   CONDITION AT COMPLETION: Hemodynamically stable, awake   DISPOSITION: PACU  INDICATION(S) FOR PROCEDURE:  Patient is a 60 y.o. male with ESRD who recently required removal of his chronic indwelling tunneled Right IJ hemodialysis catheter for short-interval recurrent staphylococcal bacteremia treated with IV antibiotics, during which  time he was dialyzed via non-tunneled Right femoral hemodialysis catheter, and he now requires replacement of durable access for hemodialysis. All risks, benefits, and alternatives to above elective procedures were discussed with the patient, who elected to proceed, and informed consent was accordingly obtained at that time.  DETAILS OF PROCEDURE:  Patient was brought to the operating suite and appropriately identified. In Trendelenburg position, Right IJ venous access site was prepped and draped in the usual sterile fashion, and following a brief timeout, limited duplex evaluation of Right internal jugular vein was performed. Percutaneous Right IJ venous access was obtained under ultrasound guidance using Seldinger technique, by which local anesthetic was injected over the Right IJ vein, and access needle was inserted into the Right IJ vein, through which soft guidewire was advanced, over which access needle was withdrawn. Guidewire was secured, attention was directed to injection of local anesthetic along the planned tunnel site, and hemodialysis catheter was tunneled retrograde from a small incision over the Right chest to the Right IJ access site. Advancement of sequential dilators over the guidewire was attempted, but was unsuccessful despite confirmation of appropriate guidewire position using fluoroscopic visualization. Accordingly, soft guidewire was exchanged for a stiff angled glidewire, over which the dilators were able to be advanced and withdrawn with less difficulty, and the introducer sheath was likewise inserted. The catheter was then advanced through the sheath into the Right internal jugular vein, but it could not be advanced beyond the distal IJ, likely attributable to endovenous scar tissue secondary to prior chronic indwelling catheter. Venoplasty was considered, but it was decided to instead proceed with placement of tunneled Left IJ hemodialysis catheter, which was performed as described  for the Right IJ access site after re-prepping and draping for Left IJ insertion site. Guidewire, dilators, and catheter were all able to be advanced without any difficulty, and position of the catheter tip was confirmed  under direct fluoroscopic visualization to be at the confluence of the Left and Right IJ veins and the proximal SVC. Cuffed tunneled hemodialysis catheter was then secured to skin with 2-0 silk suture, and skin at insertion site was re-approximated with buried interrupted 4-0 Vicryl suture. Skin was cleaned, dried, and sterile occlusive dressing was applied. Patient was then safely transferred to PACU, where portable chest x-ray was performed with no additional findings.   I was present for all aspects of the procedures, and there were no intraprocedural complications apparent.

## 2016-04-07 NOTE — Discharge Planning (Signed)
Pt being discharged home, vitals stable, no c/o pain, discharge paperwork sent with pt, no questions for the nurse

## 2016-04-07 NOTE — Transfer of Care (Signed)
Immediate Anesthesia Transfer of Care Note  Patient: [name]  Procedure(s) Performed: Procedure(s): INSERTION OF TUNNELED DIALYSIS CATHETER LEFT INTERNAL JUGULAR, ATTEMPTED INSERTION OF TUNNELLED CATHETER RIGHT INTERNAL JUGULAR (Left)  Patient Location: PACU  Anesthesia Type:General  Level of Consciousness: confused  Airway & Oxygen Therapy: Patient Spontanous Breathing and Patient connected to face mask oxygen  Post-op Assessment: Report given to RN, Post -op Vital signs reviewed and stable and Patient moving all extremities  Post vital signs: Reviewed and stable  Last Vitals:  Vitals:   04/07/16 0705 04/07/16 0735  BP: (!) 158/88 (!) 161/90  Pulse: 66   Resp:  (!) 29  Temp: 36.4 C     Last Pain:  Vitals:   04/07/16 0705  TempSrc: Oral  PainSc:       Patients Stated Pain Goal: 6 (12/81/18 8677)  Complications: No apparent anesthesia complications

## 2016-04-07 NOTE — Anesthesia Postprocedure Evaluation (Signed)
Anesthesia Post Note  Patient: Mark [name]  Procedure(s) Performed: Procedure(s) (LRB): INSERTION OF TUNNELED DIALYSIS CATHETER LEFT INTERNAL JUGULAR, ATTEMPTED INSERTION OF TUNNELLED CATHETER RIGHT INTERNAL JUGULAR (Left)  Patient location during evaluation: PACU Anesthesia Type: General Level of consciousness: awake and patient cooperative Pain management: pain level controlled Vital Signs Assessment: post-procedure vital signs reviewed and stable Respiratory status: spontaneous breathing, nonlabored ventilation and respiratory function stable Cardiovascular status: blood pressure returned to baseline and stable Postop Assessment: no signs of nausea or vomiting and adequate PO intake Anesthetic complications: no    Last Vitals:  Vitals:   04/07/16 0705 04/07/16 0735  BP: (!) 158/88 (!) 161/90  Pulse: 66   Resp:  (!) 29  Temp: 36.4 C     Last Pain:  Vitals:   04/07/16 0705  TempSrc: Oral  PainSc:                  [name]

## 2016-04-07 NOTE — Anesthesia Postprocedure Evaluation (Signed)
Anesthesia Post Note  Patient: [name]  Procedure(s) Performed: Procedure(s) (LRB): INSERTION OF TUNNELED DIALYSIS CATHETER LEFT INTERNAL JUGULAR, ATTEMPTED INSERTION OF TUNNELLED CATHETER RIGHT INTERNAL JUGULAR (Left)  Patient location during evaluation: PACU Anesthesia Type: General Level of consciousness: awake and patient cooperative Pain management: pain level controlled Vital Signs Assessment: post-procedure vital signs reviewed and stable Respiratory status: spontaneous breathing, nonlabored ventilation and respiratory function stable Cardiovascular status: blood pressure returned to baseline and stable Postop Assessment: no signs of nausea or vomiting and adequate PO intake Anesthetic complications: no    Last Vitals:  Vitals:   04/07/16 0705 04/07/16 0735  BP: (!) 158/88 (!) 161/90  Pulse: 66   Resp:  (!) 29  Temp: 36.4 C     Last Pain:  Vitals:   04/07/16 0705  TempSrc: Oral  PainSc:                  Alara Daniel J

## 2016-04-07 NOTE — Telephone Encounter (Signed)
Late entry.  2d echo results discussed with Dr. Domenic Polite late 10/31. Suspects aortic calcification but can't rule out vegetation.  Discussed with ID Dr. Baxter Flattery 11/1, with rec for TEE, but so long as repeat BC drawn 11/1 negative ~48 hours can place tunneled catheter 11/3.  Discussed above with Drs. Befakadu and Davis by telephone 11/1. Dr. Rosana Hoes ordered repeat [facility] 11/1.   Discussed above with Dr. Harl Bowie cardiology 11/1, requested outpatient TEE, which he agreed to arrange in near future. Results will dictate duration of antibiotics.  Above discussed with Dr. Hinda Lenis, patient's primary nephrologist, who drew initial positive blood cultures and is awaiting final results. Discussed ID recs with him. He will direct ongoing outpatient management. He has my contact information and was encouraged to contact me if I can be of further assistance.  Murray Hodgkins, MD [facility] [phone]

## 2016-04-07 NOTE — Anesthesia Procedure Notes (Signed)
Procedure Name: LMA Insertion Date/Time: 04/07/2016 10:00 AM Performed by: Charmaine Downs Pre-anesthesia Checklist: Patient identified, Patient being monitored, Emergency Drugs available, Timeout performed and Suction available Patient Re-evaluated:Patient Re-evaluated prior to inductionOxygen Delivery Method: Circle System Utilized Preoxygenation: Pre-oxygenation with 100% oxygen Intubation Type: IV induction Ventilation: Mask ventilation without difficulty LMA: LMA inserted LMA Size: 4.0 Number of attempts: 2 Placement Confirmation: positive ETCO2 and breath sounds checked- equal and bilateral Tube secured with: Tape Dental Injury: Teeth and Oropharynx as per pre-operative assessment and Bloody posterior oropharynx  Comments: Unable to pass 5.0 Lma

## 2016-04-07 NOTE — Anesthesia Preprocedure Evaluation (Signed)
Anesthesia Evaluation  Patient identified by MRN, date of birth, ID band Patient awake    Reviewed: Allergy & Precautions, NPO status , Patient's Chart, lab work & pertinent test results  History of Anesthesia Complications Negative for: history of anesthetic complications  Airway Mallampati: II  TM Distance: >3 FB Neck ROM: Full    Dental  (+) Partial Upper, Dental Advisory Given   Pulmonary shortness of breath and with exertion, sleep apnea (does not use CPAP) , COPD,  COPD inhaler, Current Smoker, former smoker,    breath sounds clear to auscultation       Cardiovascular hypertension, Pt. on medications (-) angina+ CAD, + Past MI and +CHF   Rhythm:Regular Rate:Normal  9/16 ECHO: EF 35% to 40%. Aortic valve: There was very mild stenosis   Neuro/Psych Anxiety negative neurological ROS     GI/Hepatic Neg liver ROS, GERD  Medicated and Controlled,  Endo/Other  diabetes, Type 2, Oral Hypoglycemic AgentsMorbid obesity  Renal/GU ESRFRenal disease (K+ 4.8, no dialysis yet)     Musculoskeletal  (+) Arthritis , Osteoarthritis,    Abdominal (+) + obese,   Peds  Hematology  (+) Blood dyscrasia (Hb 9.2), ,   Anesthesia Other Findings   Reproductive/Obstetrics                             Anesthesia Physical Anesthesia Plan  ASA: III  Anesthesia Plan: MAC   Post-op Pain Management:    Induction: Intravenous  Airway Management Planned: Simple Face Mask  Additional Equipment:   Intra-op Plan:   Post-operative Plan:   Informed Consent: I have reviewed the patients History and Physical, chart, labs and discussed the procedure including the risks, benefits and alternatives for the proposed anesthesia with the patient or authorized representative who has indicated his/her understanding and acceptance.   Dental advisory given  Plan Discussed with: CRNA and Surgeon  Anesthesia Plan Comments:  (Plan routine monitors, MAC)        Anesthesia Quick Evaluation

## 2016-04-08 ENCOUNTER — Other Ambulatory Visit: Payer: Self-pay | Admitting: Cardiovascular Disease

## 2016-04-08 LAB — CULTURE, BLOOD (ROUTINE X 2)
Culture: NO GROWTH
Culture: NO GROWTH

## 2016-04-09 LAB — CULTURE, BLOOD (ROUTINE X 2)
CULTURE: NO GROWTH
CULTURE: NO GROWTH

## 2016-04-10 ENCOUNTER — Encounter (HOSPITAL_COMMUNITY): Payer: Self-pay | Admitting: Surgery

## 2016-04-10 ENCOUNTER — Telehealth: Payer: Self-pay | Admitting: Cardiovascular Disease

## 2016-04-10 MED ORDER — HYDRALAZINE HCL 25 MG PO TABS: 25.0000 mg | ORAL_TABLET | Freq: Three times a day (TID) | ORAL | 3 refills | Status: DC

## 2016-04-10 NOTE — Telephone Encounter (Signed)
Pt wanted refills on Hydralazine 25 mg (taking 75 mg tid) already had 50 mg - also wanting to reschedule TEE scheduled for 11/10 - send message to scheduler at [facility] and Dr. Harl Bowie - pt has dialysis on M,W,F.

## 2016-04-10 NOTE — Telephone Encounter (Signed)
Question about test that has been orderd  Needs [place] refills [facility]   Hydralazine 50mg  tablet   Patient asking for 25 mg tablet

## 2016-04-13 ENCOUNTER — Telehealth: Payer: Self-pay | Admitting: *Deleted

## 2016-04-13 NOTE — Telephone Encounter (Signed)
-----   Message from Orinda Kenner sent at 04/11/2016  8:38 AM EST ----- Rescheduled for 04/18/16 @ 10:30. Register @ 9:00 @ [facility]. Is this a patient there? When I called patient yesterday to give him date and time, he said he didn't know anything about it.  Coralyn Mark ----- Message ----- From: Massie Maroon, CMA Sent: 04/10/2016   3:40 PM To: Ladoris Gene, MD  This pt is scheduled for TEE on 11/10 but wants to change date since he take dialysis on M W F. Thanks   [employer]

## 2016-04-13 NOTE — Telephone Encounter (Signed)
Pt and wife were informed of TEE and given explanation of procedure along with rescheduled date and time at pt request. Pt now wants to cx procedure (says his throat is sore) and will call back when ready to schedule.

## 2016-04-14 ENCOUNTER — Other Ambulatory Visit (HOSPITAL_COMMUNITY): Payer: Managed Care, Other (non HMO)

## 2016-04-18 ENCOUNTER — Encounter (HOSPITAL_COMMUNITY): Admission: RE | Payer: Self-pay | Source: Ambulatory Visit

## 2016-04-18 ENCOUNTER — Ambulatory Visit (HOSPITAL_COMMUNITY)
Admission: RE | Admit: 2016-04-18 | Payer: Managed Care, Other (non HMO) | Source: Ambulatory Visit | Admitting: Cardiovascular Disease

## 2016-04-18 ENCOUNTER — Other Ambulatory Visit (HOSPITAL_COMMUNITY): Payer: Managed Care, Other (non HMO)

## 2016-04-18 ENCOUNTER — Other Ambulatory Visit (HOSPITAL_COMMUNITY): Payer: Self-pay | Admitting: Surgery

## 2016-04-18 DIAGNOSIS — N186 End stage renal disease: Secondary | ICD-10-CM

## 2016-04-18 SURGERY — ECHOCARDIOGRAM, TRANSESOPHAGEAL
Anesthesia: Moderate Sedation

## 2016-04-21 ENCOUNTER — Other Ambulatory Visit (HOSPITAL_COMMUNITY): Payer: Self-pay | Admitting: Surgery

## 2016-04-21 ENCOUNTER — Telehealth: Payer: Self-pay | Admitting: *Deleted

## 2016-04-21 ENCOUNTER — Encounter: Payer: Self-pay | Admitting: *Deleted

## 2016-04-21 ENCOUNTER — Ambulatory Visit (HOSPITAL_COMMUNITY)
Admission: RE | Admit: 2016-04-21 | Discharge: 2016-04-21 | Disposition: A | Discharge status: Home / Self Care | Payer: Managed Care, Other (non HMO) | Source: Ambulatory Visit | Attending: Cardiovascular Disease | Admitting: Cardiovascular Disease

## 2016-04-21 ENCOUNTER — Other Ambulatory Visit: Payer: Self-pay | Admitting: Cardiovascular Disease

## 2016-04-21 DIAGNOSIS — R7881 Bacteremia: Secondary | ICD-10-CM

## 2016-04-21 NOTE — Telephone Encounter (Signed)
-----   Message from Orinda Kenner sent at 04/21/2016 10:36 AM EST ----- Hey lady,  Got him scheduled for Tuesday 11/28 @ 0930. Needs to register @ 0800 @ [facility]. Will You ask Dr.Koneswaran to put orders in please.   Thanks, Coralyn Mark ----- Message ----- From: Massie Maroon, CMA Sent: 04/18/2016   3:46 PM To: Bertram Gala Goins  Pt called back and wants to schedule TEE for 28th or 30th of November if possible - can only do Tuesday or Thursdays. You can let me know and I will call him. Thank you I know you have scheduled this several times   Inice Sanluis

## 2016-04-21 NOTE — Telephone Encounter (Signed)
Pt aware - instruction letter mailed

## 2016-04-25 ENCOUNTER — Ambulatory Visit (HOSPITAL_COMMUNITY)
Admission: RE | Admit: 2016-04-25 | Discharge: 2016-04-25 | Disposition: A | Discharge status: Home / Self Care | Payer: Managed Care, Other (non HMO) | Source: Ambulatory Visit | Attending: Surgery | Admitting: Surgery

## 2016-04-25 ENCOUNTER — Other Ambulatory Visit (HOSPITAL_COMMUNITY): Payer: Self-pay | Admitting: Surgery

## 2016-04-25 DIAGNOSIS — L988 Other specified disorders of the skin and subcutaneous tissue: Secondary | ICD-10-CM | POA: Diagnosis not present

## 2016-04-25 DIAGNOSIS — N186 End stage renal disease: Secondary | ICD-10-CM

## 2016-04-26 ENCOUNTER — Encounter: Payer: Self-pay | Admitting: Physician Assistant

## 2016-04-26 ENCOUNTER — Other Ambulatory Visit: Payer: Self-pay | Admitting: Physician Assistant

## 2016-04-26 NOTE — Progress Notes (Signed)
Was asked by Dr. Bronson Ing to enter TEE orders which I did - it looks like the actual order for TEE is already in so I did not order a duplicate. Otherwise orders are in. Per chart, the office already reviewed procedure with patient. Dayna Dunn PA-C

## 2016-05-02 ENCOUNTER — Ambulatory Visit (HOSPITAL_COMMUNITY)
Admission: RE | Admit: 2016-05-02 | Discharge: 2016-05-02 | Disposition: A | Discharge status: Home / Self Care | Payer: Managed Care, Other (non HMO) | Source: Ambulatory Visit | Attending: Cardiovascular Disease | Admitting: Cardiovascular Disease

## 2016-05-02 ENCOUNTER — Ambulatory Visit (HOSPITAL_BASED_OUTPATIENT_CLINIC_OR_DEPARTMENT_OTHER): Payer: Managed Care, Other (non HMO)

## 2016-05-02 ENCOUNTER — Encounter (HOSPITAL_COMMUNITY): Payer: Self-pay | Admitting: *Deleted

## 2016-05-02 ENCOUNTER — Encounter (HOSPITAL_COMMUNITY)
Admission: RE | Disposition: A | Discharge status: Home / Self Care | Payer: Self-pay | Source: Ambulatory Visit | Attending: Cardiovascular Disease

## 2016-05-02 ENCOUNTER — Other Ambulatory Visit: Payer: Self-pay | Admitting: *Deleted

## 2016-05-02 DIAGNOSIS — B957 Other staphylococcus as the cause of diseases classified elsewhere: Secondary | ICD-10-CM | POA: Insufficient documentation

## 2016-05-02 DIAGNOSIS — F419 Anxiety disorder, unspecified: Secondary | ICD-10-CM | POA: Diagnosis not present

## 2016-05-02 DIAGNOSIS — Z955 Presence of coronary angioplasty implant and graft: Secondary | ICD-10-CM | POA: Diagnosis not present

## 2016-05-02 DIAGNOSIS — I2510 Atherosclerotic heart disease of native coronary artery without angina pectoris: Secondary | ICD-10-CM | POA: Insufficient documentation

## 2016-05-02 DIAGNOSIS — I252 Old myocardial infarction: Secondary | ICD-10-CM | POA: Insufficient documentation

## 2016-05-02 DIAGNOSIS — I5022 Chronic systolic (congestive) heart failure: Secondary | ICD-10-CM

## 2016-05-02 DIAGNOSIS — K219 Gastro-esophageal reflux disease without esophagitis: Secondary | ICD-10-CM | POA: Diagnosis not present

## 2016-05-02 DIAGNOSIS — I255 Ischemic cardiomyopathy: Secondary | ICD-10-CM | POA: Diagnosis not present

## 2016-05-02 DIAGNOSIS — Z7902 Long term (current) use of antithrombotics/antiplatelets: Secondary | ICD-10-CM | POA: Diagnosis not present

## 2016-05-02 DIAGNOSIS — M797 Fibromyalgia: Secondary | ICD-10-CM | POA: Insufficient documentation

## 2016-05-02 DIAGNOSIS — Z7982 Long term (current) use of aspirin: Secondary | ICD-10-CM | POA: Diagnosis not present

## 2016-05-02 DIAGNOSIS — E7800 Pure hypercholesterolemia, unspecified: Secondary | ICD-10-CM | POA: Diagnosis not present

## 2016-05-02 DIAGNOSIS — Z7984 Long term (current) use of oral hypoglycemic drugs: Secondary | ICD-10-CM | POA: Diagnosis not present

## 2016-05-02 DIAGNOSIS — Z79899 Other long term (current) drug therapy: Secondary | ICD-10-CM | POA: Insufficient documentation

## 2016-05-02 DIAGNOSIS — E1122 Type 2 diabetes mellitus with diabetic chronic kidney disease: Secondary | ICD-10-CM | POA: Insufficient documentation

## 2016-05-02 DIAGNOSIS — I132 Hypertensive heart and chronic kidney disease with heart failure and with stage 5 chronic kidney disease, or end stage renal disease: Secondary | ICD-10-CM | POA: Diagnosis not present

## 2016-05-02 DIAGNOSIS — Z992 Dependence on renal dialysis: Secondary | ICD-10-CM | POA: Insufficient documentation

## 2016-05-02 DIAGNOSIS — R7881 Bacteremia: Secondary | ICD-10-CM

## 2016-05-02 DIAGNOSIS — N186 End stage renal disease: Secondary | ICD-10-CM | POA: Diagnosis not present

## 2016-05-02 DIAGNOSIS — I340 Nonrheumatic mitral (valve) insufficiency: Secondary | ICD-10-CM | POA: Insufficient documentation

## 2016-05-02 DIAGNOSIS — Z87891 Personal history of nicotine dependence: Secondary | ICD-10-CM | POA: Insufficient documentation

## 2016-05-02 HISTORY — PX: TEE WITHOUT CARDIOVERSION: SHX5443

## 2016-05-02 LAB — GLUCOSE, CAPILLARY: GLUCOSE-CAPILLARY: 162 mg/dL — AB (ref 65–99)

## 2016-05-02 SURGERY — ECHOCARDIOGRAM, TRANSESOPHAGEAL
Anesthesia: Moderate Sedation

## 2016-05-02 MED ORDER — SODIUM CHLORIDE BACTERIOSTATIC 0.9 % IJ SOLN
INTRAMUSCULAR | Status: AC
  Filled 2016-05-02: qty 20

## 2016-05-02 MED ORDER — LIDOCAINE VISCOUS 2 % MT SOLN
OROMUCOSAL | Status: AC
  Filled 2016-05-02: qty 15

## 2016-05-02 MED ORDER — SODIUM CHLORIDE 0.9 % IV SOLN
INTRAVENOUS | Status: DC
  Administered 2016-05-02: 09:00:00 via INTRAVENOUS

## 2016-05-02 MED ORDER — MIDAZOLAM HCL 5 MG/5ML IJ SOLN
INTRAMUSCULAR | Status: AC
  Filled 2016-05-02: qty 10

## 2016-05-02 MED ORDER — MIDAZOLAM HCL 5 MG/5ML IJ SOLN
INTRAMUSCULAR | Status: DC | PRN
  Administered 2016-05-02 (×2): 2 mg via INTRAVENOUS

## 2016-05-02 MED ORDER — FENTANYL CITRATE (PF) 100 MCG/2ML IJ SOLN
INTRAMUSCULAR | Status: DC | PRN
  Administered 2016-05-02 (×2): 50 ug via INTRAVENOUS

## 2016-05-02 MED ORDER — FENTANYL CITRATE (PF) 100 MCG/2ML IJ SOLN
INTRAMUSCULAR | Status: AC
  Filled 2016-05-02: qty 2

## 2016-05-02 MED ORDER — BUTAMBEN-TETRACAINE-BENZOCAINE 2-2-14 % EX AERO
INHALATION_SPRAY | CUTANEOUS | Status: DC | PRN
  Administered 2016-05-02: 2 via TOPICAL

## 2016-05-02 MED ORDER — LIDOCAINE VISCOUS 2 % MT SOLN
OROMUCOSAL | Status: DC | PRN
  Administered 2016-05-02: 1 via OROMUCOSAL

## 2016-05-02 NOTE — Progress Notes (Signed)
*  PRELIMINARY RESULTS* Echocardiogram TEE has been performed.  Leavy Cella 05/02/2016, 10:22 AM

## 2016-05-02 NOTE — Discharge Instructions (Signed)
Upper Endoscopy, Care After  Refer to this sheet in the next few weeks. These instructions provide you with information about caring for yourself after your procedure. Your health care provider may also give you more specific instructions. Your treatment has been planned according to current medical practices, but problems sometimes occur. Call your health care provider if you have any problems or questions after your procedure.  What can I expect after the procedure?  After the procedure, it is common to have:  · A sore throat.  · Bloating.  · Nausea.    Follow these instructions at home:  · Follow instructions from your health care provider about what to eat or drink after your procedure.  · Return to your normal activities as told by your health care provider. Ask your health care provider what activities are safe for you.  · Take over-the-counter and prescription medicines only as told by your health care provider.  · Do not drive for 24 hours if you received a sedative.  · Keep all follow-up visits as told by your health care provider. This is important.  Contact a health care provider if:  · You have a sore throat that lasts longer than one day.  · You have trouble swallowing.  Get help right away if:  · You have a fever.  · You vomit blood or your vomit looks like coffee grounds.  · You have bloody, black, or tarry stools.  · You have a severe sore throat or you cannot swallow.  · You have difficulty breathing.  · You have severe pain in your chest or belly.  This information is not intended to replace advice given to you by your health care provider. Make sure you discuss any questions you have with your health care provider.  Document Released: 11/21/2011 Document Revised: 10/28/2015 Document Reviewed: 03/04/2015  Elsevier Interactive Patient Education © 2017 Elsevier Inc.

## 2016-05-02 NOTE — H&P (View-Only) (Signed)
Medical Consult in ED  Mark [name] CHY:[id number] DOB: 07-19-1955 DOA: 04/04/2016  PCP: Deloria Lair, MD  Nephrologist is Dr. Hinda Lenis.  Cardiologist is Dr. Bronson Ing  Impressions/Recommendation 1. Recurrent GPC bacteremia, presumed line infection (tunneled HD catheter placed 02/2015). Completely asymptomatic, normal wbc, normal hemodynamics. 2. ESRD on hemodialysis Monday Wednesday Friday. Potassium normal. 3. DM type 2, random blood sugar 150. Anion gap normal. 4. CAD asymptomatic. 5. Chronic systolic congestive heart failure. Euvolemic.   Patient appears clinically well and is completely asymptomatic. No fever or leukocytosis. No indication for observation or admission at this time. I discussed his care with Dr. Hinda Lenis, Dr. Rosana Hoes and Dr. Baxter Flattery.   Dr. Rosana Hoes plans to remove tunneled catheter today and will make arrangements for new permanent catheter placement in the near future, anticipated 11/3.   Per discussion with infectious disease, recommend 48 hour "line holiday", therefore would be clear for tunneled catheter placement 11/3.  Recommend repeat blood cultures November 1, if remains negative, can place new tunneled line 11/3.  Dr. Hinda Lenis will coordinate with Dr. Rosana Hoes regarding timing of next dialysis session as the patient will need temporary access placed for dialysis either November 1 or November 2.  Echocardiogram ordered and will be completed today  Discussed recommendations with attending EDP Dr. Viviana Simpler. patient may be discharged home after line has been pulled today. He understands to return for fever or worsening of condition.   Patient coming from: home  Chief Complaint: bacteria in blood   HPI:  60 year old man with end-stage renal disease on hemodialysis Monday, Wednesday, Friday since the emergency department today for positive blood culture.  History obtained from patient as well as his nephrologist Dr. Hinda Lenis. Approximately one month ago the  patient had Staphylococcus epidermidis bacteremia which was treated with 3 weeks of antibiotics. He had repeat blood cultures drawn after treatment was complete, on 10/27, these cultures now show gram-positive cocci. Identification not yet available. The patient was sent to the emergency department 10/30, was treated with vancomycin and discharged home. His nephrologist called him again today and told him to come back to the emergency department for further treatment.  Patient feels well and has no complaints. No fever, negative review of systems. No breathing difficulties. No localizing symptoms. He would like to go home.  ED Course: afebrile, VSS, no hypoxia, treated with vancomycin Pertinent labs: BMP, CBC unremarkable, stable Hgb 9.4. BC 10/30 NGTD. Referenced positive blood culture not in Epic.  Review of Systems:  Negative for fever, visual changes, sore throat, rash, new muscle aches, chest pain, SOB, dysuria, bleeding, n/v/abdominal pain.  Past Medical History:  Diagnosis Date  . Anxiety    occas. panic attack, takes xanax occas  . Arthritis    herniated disc, lumbar   . Back pain   . CAD (coronary artery disease)    03/25/2015 95% prox to mid LAD treated with Promus Premier DES 3.5x42mm postdilated to 4.18mm, 100% prox to distal RCA lesion filled in the PDA region via collaterals from LAD and PLA  . Cancer ([place])    - skin ca on face- removed   . CHF (congestive heart failure) ([place])   . Chronic kidney disease   . Complication of anesthesia    " it takes a long time to get over it."   . Diabetes mellitus without complication ([place])    type 2  . Fibromyalgia   . GERD (gastroesophageal reflux disease)    otc- pepcid , approx. every other  day    .  H/O acute respiratory failure   . Hematuria    being followed by Dr. Hinda Lenis for decreased kidney function   . History of blood product transfusion 02/13/2014   After having back surgery  . Hypercholesteremia   . Hypertension   .  Myocardial infarction   . Pneumonia    "walking"  . Shortness of breath dyspnea   . Sleep apnea    test aborted, due to not able to relax , since the aborted test he had surgery for gallbladder & he reports that he was told that he has sleep apnea     Past Surgical History:  Procedure Laterality Date  . AV FISTULA PLACEMENT Left 02/02/2015   Procedure: LEFT ARM RADIOCEPHALIC ARTERIOVENOUS (AV) FISTULA CREATION;  Surgeon: Conrad Leilani Estates, MD;  Location: [place];  Service: Vascular;  Laterality: Left;  . BACK SURGERY  02/13/14  . CARDIAC CATHETERIZATION N/A 03/25/2015   Procedure: Left Heart Cath and Coronary Angiography;  Surgeon: Leonie Man, MD;  Location: [place] CV LAB;  Service: Cardiovascular;  Laterality: N/A;  . CARDIAC CATHETERIZATION N/A 03/25/2015   Procedure: Coronary Stent Intervention;  Surgeon: Leonie Man, MD;  Location: [place] CV LAB;  Service: Cardiovascular;  Laterality: N/A;  . CHOLECYSTECTOMY    . COLONOSCOPY    . EYE SURGERY     cataracts remove, bilateral, w/IOL.  Marland Kitchen INSERTION OF DIALYSIS CATHETER N/A 03/09/2015   Procedure: INSERTION OF DIALYSIS CATHETER;  Surgeon: Conrad Oakmont, MD;  Location: [place];  Service: Vascular;  Laterality: N/A;  . LUMBAR LAMINECTOMY/DECOMPRESSION MICRODISCECTOMY Right 02/13/2014   Procedure: RIGHT LUMBAR FOUR-FIVE, RIGHT LUMBAR FIVE- SACRAL ONE LUMBAR LAMINECTOMY/DECOMPRESSION MICRODISCECTOMY ;  Surgeon: Ashok Pall, MD;  Location: [place] NEURO ORS;  Service: Neurosurgery;  Laterality: Right;  Right L4-5 and Right L5-S1 diskectomies  . TONSILLECTOMY       reports that he quit smoking about 13 months ago. His smoking use included Cigarettes. He started smoking about 42 years ago. He has a 20.00 pack-year smoking history. He has never used smokeless tobacco. He reports that he does not drink alcohol or use drugs. Ambulatory   No Known Allergies  Family History  Problem Relation Age of Onset  . Adopted: Yes  . Prostate cancer  Father   . Cancer Father     prostate  . Heart failure Maternal Grandmother   . Heart attack Maternal Grandfather   . Colon cancer Maternal Grandfather      Prior to Admission medications   Medication Sig Start Date End Date Taking? Authorizing Provider  acetaminophen (TYLENOL) 500 MG tablet Take 500 mg by mouth every 6 (six) hours as needed for mild pain or moderate pain.   Yes Historical Provider, MD  ALPRAZolam Duanne Moron) 1 MG tablet Take 1 mg by mouth 2 (two) times daily as needed for anxiety.  12/21/14  Yes Historical Provider, MD  aspirin EC 81 MG tablet Take 81 mg by mouth every evening.    Yes Historical Provider, MD  atorvastatin (LIPITOR) 40 MG tablet Take 40 mg by mouth at bedtime.   Yes Historical Provider, MD  calcium carbonate (TUMS - DOSED IN MG ELEMENTAL CALCIUM) 500 MG chewable tablet Chew 1 tablet by mouth daily as needed for indigestion or heartburn.   Yes Historical Provider, MD  furosemide (LASIX) 40 MG tablet Take 80 mg by mouth See admin instructions. 80mg  on Tuesdays, Thursdays, Saturdays, and Sundays. Takes 40mg  on MWF (dialysis days)   Yes Historical Provider, MD  glipiZIDE (GLUCOTROL XL) 5 MG 24 hr tablet Take 1 tablet (5 mg total) by mouth daily before breakfast. Patient taking differently: Take 2.5 mg by mouth daily before breakfast.  02/15/15  Yes Thurnell Lose, MD  hydrALAZINE (APRESOLINE) 50 MG tablet Take 1.5 tablets (75 mg total) by mouth 3 (three) times daily. 08/24/15  Yes Herminio Commons, MD  HYDROcodone-acetaminophen (NORCO) 10-325 MG per tablet Take 1 tablet by mouth every 6 (six) hours as needed for severe pain.  02/10/15  Yes Historical Provider, MD  multivitamin (RENA-VIT) TABS tablet Take 1 tablet by mouth every evening.    Yes Historical Provider, MD  nitroGLYCERIN (NITROSTAT) 0.4 MG SL tablet Place 1 tablet (0.4 mg total) under the tongue every 5 (five) minutes as needed. Patient taking differently: Place 0.4 mg under the tongue every 5 (five)  minutes as needed for chest pain.  03/23/15  Yes Herminio Commons, MD  NON FORMULARY Take 1 tablet by mouth daily as needed (for restful legs. **HYLANDS RESTFUL LEGS FORMULA*).   Yes Historical Provider, MD  ranitidine (ZANTAC) 150 MG tablet Take 150 mg by mouth daily as needed for heartburn.   Yes Historical Provider, MD  sodium bicarbonate 650 MG tablet Take 650 mg by mouth 3 (three) times daily.  01/09/15  Yes Historical Provider, MD  ticagrelor (BRILINTA) 90 MG TABS tablet Take 1 tablet (90 mg total) by mouth 2 (two) times daily. 03/26/15  Yes Almyra Deforest, PA  traZODone (DESYREL) 50 MG tablet Take 50-100 mg by mouth at bedtime as needed for sleep.    Yes Historical Provider, MD    Physical Exam: Vitals:   04/04/16 1115  BP: 157/72  Pulse: 89  Resp: 20  Temp: 97.4 F (36.3 C)  TempSrc: Oral  SpO2: 99%  Weight: 122.5 kg (270 lb)  Height: 6' (1.829 m)    Constitutional:  . Appears calm and comfortable Eyes:  . PERRL and irises appear normal . Normal conjunctivae and lids ENMT:  . grossly normal hearing . Lips appear normal Neck:  . neck appears normal Respiratory:  . CTA bilaterally, no w/r/r.  . Respiratory effort normal. No retractions or accessory muscle use Cardiovascular:  . RRR, no m/r/g . No LE extremity edema   Abdomen:  . Soft, no tenderness or masses Musculoskeletal:  . Digits/nails: no clubbing, cyanosis, petechiae, infection . Bilateral upper and lower extremity strength and tone grossly normal. No tenderness. Skin:  . No rashes, lesions, ulcers . palpation of skin: no induration or nodules Neurologic:  . Grossly normal Psychiatric:  . judgement and insight appear normal . Mental status o Mood, affect appropriate  Wt Readings from Last 3 Encounters:  04/04/16 122.5 kg (270 lb)  04/03/16 122.5 kg (270 lb)  03/07/16 127.9 kg (282 lb)    I have personally reviewed following labs and imaging studies  Labs on Admission:  CBC:  Recent Labs Lab  03/31/16 1920 04/03/16 1619 04/04/16 1208  WBC 7.0 6.4 5.9  NEUTROABS 5.8 5.3 4.6  HGB 9.6* 9.5* 9.4*  HCT 30.9* 29.7* 30.1*  MCV 96.3 96.7 97.4  PLT 157 175 161   Basic Metabolic Panel:  Recent Labs Lab 03/31/16 1920 04/03/16 1619 04/04/16 1208  NA 139 135 137  K 4.2 3.9 4.3  CL 102 100* 104  CO2 26 27 25   GLUCOSE 143* 161* 150*  BUN 19 22* 33*  CREATININE 3.94* 3.80* 5.38*  CALCIUM 8.6* 8.3* 8.5*   Liver Function Tests:  Recent Labs  Lab 03/31/16 1920  AST 27  ALT 25  ALKPHOS 57  BILITOT 0.7  PROT 6.5  ALBUMIN 3.5    ) Recent Results (from the past 240 hour(s))  Blood culture (routine x 2)     Status: None (Preliminary result)   Collection Time: 04/03/16  4:19 PM  Result Value Ref Range Status   Specimen Description BLOOD RIGHT WRIST  Final   Special Requests BOTTLES DRAWN AEROBIC AND ANAEROBIC 6CC  Final   Culture NO GROWTH < 24 HOURS  Final   Report Status PENDING  Incomplete  Blood culture (routine x 2)     Status: None (Preliminary result)   Collection Time: 04/03/16  5:16 PM  Result Value Ref Range Status   Specimen Description BLOOD RIGHT ARM  Final   Special Requests BOTTLES DRAWN AEROBIC AND ANAEROBIC 6CC  Final   Culture NO GROWTH < 24 HOURS  Final   Report Status PENDING  Incomplete  Blood culture (routine x 2)     Status: None (Preliminary result)   Collection Time: 04/04/16 12:08 PM  Result Value Ref Range Status   Specimen Description BLOOD RIGHT ANTECUBITAL  Final   Special Requests BOTTLES DRAWN AEROBIC AND ANAEROBIC 10CC  Final   Culture PENDING  Incomplete   Report Status PENDING  Incomplete  Blood culture (routine x 2)     Status: None (Preliminary result)   Collection Time: 04/04/16 12:12 PM  Result Value Ref Range Status   Specimen Description BLOOD RIGHT FOREARM  Final   Special Requests BOTTLES DRAWN AEROBIC AND ANAEROBIC 8CC EACH  Final   Culture PENDING  Incomplete   Report Status PENDING  Incomplete      Radiological  Exams on Admission: Dg Chest 2 View  Result Date: 04/04/2016 CLINICAL DATA:  Nonproductive cough and orthopnea for 4 weeks. Fatigue. Chronic kidney disease on dialysis. EXAM: CHEST  2 VIEW COMPARISON:  03/31/2016 FINDINGS: Stable mild cardiomegaly. Stable mild diffuse pulmonary interstitial prominence. No evidence of acute infiltrate or pleural effusion. Right jugular dual-lumen central venous dialysis catheter remains in appropriate position. IMPRESSION: Stable mild cardiomegaly and chronic interstitial prominence. No acute findings. Electronically Signed   By: Earle Gell M.D.   On: 04/04/2016 12:33     Principal Problem:   Bacteremia associated with intravascular line ([place]) Active Problems:   ESRD (end stage renal disease) on dialysis ([place])     Time spent: 90 minutes, involving counseling of patient, d/w with Drs. Cay Schillings and [place] and coordination of outpatient care  Murray Hodgkins, MD  [facility] Direct contact: [phone] --Via amion app OR  --[email or web address]; password [email or web address]  7PM-7AM contact night coverage as above  04/04/2016, 2:55 PM

## 2016-05-02 NOTE — Interval H&P Note (Signed)
History and Physical Interval Note: No changes in history. Will proceed with TEE as planned.  05/02/2016 9:42 AM  Mark [name]  has presented today for surgery, with the diagnosis of bacteremia  The various methods of treatment have been discussed with the patient and family. After consideration of risks, benefits and other options for treatment, the patient has consented to  Procedure(s): TRANSESOPHAGEAL ECHOCARDIOGRAM (TEE) (N/A) as a surgical intervention .  The patient's history has been reviewed, patient examined, no change in status, stable for surgery.  I have reviewed the patient's chart and labs.  Questions were answered to the patient's satisfaction.     Mark [name]

## 2016-05-03 ENCOUNTER — Telehealth: Payer: Self-pay | Admitting: *Deleted

## 2016-05-03 NOTE — Telephone Encounter (Signed)
Called patient with test results. No answer. Left message to call back.  

## 2016-05-03 NOTE — Telephone Encounter (Signed)
-----   Message from Herminio Commons, MD sent at 05/02/2016 10:44 AM EST ----- No vegetations.

## 2016-05-05 ENCOUNTER — Encounter (HOSPITAL_COMMUNITY): Payer: Self-pay | Admitting: Cardiovascular Disease

## 2016-05-08 ENCOUNTER — Telehealth: Payer: Self-pay | Admitting: Cardiovascular Disease

## 2016-05-08 MED ORDER — HYDRALAZINE HCL 50 MG PO TABS: 75.0000 mg | ORAL_TABLET | Freq: Three times a day (TID) | ORAL | 6 refills | Status: DC

## 2016-05-08 NOTE — Telephone Encounter (Signed)
hydrALAZINE (APRESOLINE) 25 MG tablet   Patient picked up new refill and is questioning why instructions/dose has changed

## 2016-05-08 NOTE — Telephone Encounter (Signed)
Should be 75 mg tid, as explained during office visit.

## 2016-05-08 NOTE — Telephone Encounter (Signed)
Patient notified.  He will take 2 tabs of the 25mg  to equal the 75mg  three x day to make up the correct dose for now.  New prescription placed on file for 50mg  tabs - 1 1/2 tabs TID sent to [facility].

## 2016-05-21 NOTE — H&P (Signed)
[facility] Surgical History and Physical - 05/18/2016  Patient Name: Mark [name] Date of Birth: 02-04-1956  Subjective: This 60 year old male presents for surgery followup and to discuss results of LUE vein mapping for durable HD access. Patient has no  significant complaints, denies peri-catheter pain (tunnelled Left IJ catheter with Left chest exit site), fever/chills, CP, or SOB and denies Left hand pain, numbness/tingling, or edema.  Social History: Reviewed  Social History  Preferred Language: English Race:  White Ethnicity: Not Hispanic / Latino Age: 46 year Marital Status:  M  Allergies:  Allergies: No allergies and no drug allergies found.  Vital Signs as of 08/81/1031:  Systolic 594: Diastolic 92: Heart Rate 73: Temp 37.06C (Temporal) Height 183CM: Weight 127.91KG: BMI 38.25 kg/m2  Physical Exam: General: Well appearing, well nourished in no distress. Cardiovascular, Musculoskeletal, Integumentary: tunneled Left IJ hemodialysis catheter well-secured without erythema or drainage, Left palpable radial pulse, and LUE FROM with warm hands, motor and sensation intact, and no edema  Imaging: LUE vein mapping for hemodialysis access creation (58/59/2924) LUE cephalic vein suitable for creation of mid- or proximal forearm radial- or brachial- cephalic AV fistula  Assessment:  60 year old Male doing well s/p insertion of tunnelled Left IJ hemodialysis catheter requiring conversion to general anesthesia due to patient's back pain and semi-sedated state with confusion and combativeness during procedure, requring durable non-catheter hemodialysis access.  Plan:      - results of LUE vein mapping discussed with patient      - all risks, benefits, and alternatives to creation of LUE AV fistula, possible placement of AV graft, discussed with patient, who elects to proceed, and informed consent was accordingly obtained      - continue hemodialysis via tunnelled Left IJ hemodialysis  catheter as per nephrology      - will plan for creation of Left mid-forearm radial-cephalic or brachial-cephalic AV fistula      - surgical follow-up 2 weeks after above planned procedure  -- Corene Cornea E. Rosana Hoes, MD, [place]: [facility] General Surgery and Vascular Care Office #: [phone]

## 2016-05-22 NOTE — Patient Instructions (Signed)
Mark [name]  05/22/2016     @PREFPERIOPPHARMACY @   Your procedure is scheduled on 05/25/2016.  Report to [facility] at 6:15 A.M.  Call this number if you have problems the morning of surgery:  [phone]   Remember:  Do not eat food or drink liquids after midnight.  Take these medicines the morning of surgery with A SIP OF WATER : Xanax, Apresoline, Norco, Claritin and Zantac   Do not wear jewelry, make-up or nail polish.  Do not wear lotions, powders, or perfumes, or deoderant.  Do not shave 48 hours prior to surgery.  Men may shave face and neck.  Do not bring valuables to the hospital.  [facility] is not responsible for any belongings or valuables.  Contacts, dentures or bridgework may not be worn into surgery.  Leave your suitcase in the car.  After surgery it may be brought to your room.  For patients admitted to the hospital, discharge time will be determined by your treatment team.  Patients discharged the day of surgery will not be allowed to drive home.   Name and phone number of your driver:   family Special instructions:  n/a  Please read over the following fact sheets that you were given. Care and Recovery After Surgery  General Anesthesia, Adult General anesthesia is the use of medicines to make a person "go to sleep" (be unconscious) for a medical procedure. General anesthesia is often recommended when a procedure:  Is long.  Requires you to be still or in an unusual position.  Is major and can cause you to lose blood.  Is impossible to do without general anesthesia. The medicines used for general anesthesia are called general anesthetics. In addition to making you sleep, the medicines:  Prevent pain.  Control your blood pressure.  Relax your muscles. Tell a health care provider about:  Any allergies you have.  All medicines you are taking, including vitamins, herbs, eye drops, creams, and over-the-counter medicines.  Any problems you or  family members have had with anesthetic medicines.  Types of anesthetics you have had in the past.  Any bleeding disorders you have.  Any surgeries you have had.  Any medical conditions you have.  Any history of heart or lung conditions, such as heart failure, sleep apnea, or chronic obstructive pulmonary disease (COPD).  Whether you are pregnant or may be pregnant.  Whether you use tobacco, alcohol, marijuana, or street drugs.  Any history of Armed forces logistics/support/administrative officer.  Any history of depression or anxiety. What are the risks? Generally, this is a safe procedure. However, problems may occur, including:  Allergic reaction to anesthetics.  Lung and heart problems.  Inhaling food or liquids from your stomach into your lungs (aspiration).  Injury to nerves.  Waking up during your procedure and being unable to move (rare).  Extreme agitation or a state of mental confusion (delirium) when you wake up from the anesthetic.  Air in the bloodstream, which can lead to stroke. These problems are more likely to develop if you are having a major surgery or if you have an advanced medical condition. You can prevent some of these complications by answering all of your health care provider's questions thoroughly and by following all pre-procedure instructions. General anesthesia can cause side effects, including:  Nausea or vomiting  A sore throat from the breathing tube.  Feeling cold or shivery.  Feeling tired, washed out, or achy.  Sleepiness or drowsiness.  Confusion or agitation. What happens before  the procedure? Staying hydrated  Follow instructions from your health care provider about hydration, which may include:  Up to 2 hours before the procedure - you may continue to drink clear liquids, such as water, clear fruit juice, black coffee, and plain tea. Eating and drinking restrictions  Follow instructions from your health care provider about eating and drinking, which may  include:  8 hours before the procedure - stop eating heavy meals or foods such as meat, fried foods, or fatty foods.  6 hours before the procedure - stop eating light meals or foods, such as toast or cereal.  6 hours before the procedure - stop drinking milk or drinks that contain milk.  2 hours before the procedure - stop drinking clear liquids. Medicines  Ask your health care provider about:  Changing or stopping your regular medicines. This is especially important if you are taking diabetes medicines or blood thinners.  Taking medicines such as aspirin and ibuprofen. These medicines can thin your blood. Do not take these medicines before your procedure if your health care provider instructs you not to.  Taking new dietary supplements or medicines. Do not take these during the week before your procedure unless your health care provider approves them.  If you are told to take a medicine or to continue taking a medicine on the day of the procedure, take the medicine with sips of water. General instructions   Ask if you will be going home the same day, the following day, or after a longer hospital stay.  Plan to have someone take you home.  Plan to have someone stay with you for the first 24 hours after you leave the hospital or clinic.  For 3-6 weeks before the procedure, try not to use any tobacco products, such as cigarettes, chewing tobacco, and e-cigarettes.  You may brush your teeth on the morning of the procedure, but make sure to spit out the toothpaste. What happens during the procedure?  You will be given anesthetics through a mask and through an IV tube in one of your veins.  You may receive medicine to help you relax (sedative).  As soon as you are asleep, a breathing tube may be used to help you breathe.  An anesthesia specialist will stay with you throughout the procedure. He or she will help keep you comfortable and safe by continuing to give you medicines and  adjusting the amount of medicine that you get. He or she will also watch your blood pressure, pulse, and oxygen levels to make sure that the anesthetics do not cause any problems.  If a breathing tube was used to help you breathe, it will be removed before you wake up. The procedure may vary among health care providers and hospitals. What happens after the procedure?  You will wake up, often slowly, after the procedure is complete, usually in a recovery area.  Your blood pressure, heart rate, breathing rate, and blood oxygen level will be monitored until the medicines you were given have worn off.  You may be given medicine to help you calm down if you feel anxious or agitated.  If you will be going home the same day, your health care provider may check to make sure you can stand, drink, and urinate.  Your health care providers will treat your pain and side effects before you go home.  Do not drive for 24 hours if you received a sedative.  You may:  Feel nauseous and vomit.  Have a  sore throat.  Have mental slowness.  Feel cold or shivery.  Feel sleepy.  Feel tired.  Feel sore or achy, even in parts of your body where you did not have surgery. This information is not intended to replace advice given to you by your health care provider. Make sure you discuss any questions you have with your health care provider. Document Released: 08/29/2007 Document Revised: 11/02/2015 Document Reviewed: 05/06/2015 Elsevier Interactive Patient Education  2017 [place] Fistula Placement Introduction Arteriovenous (AV) fistula placement is a surgical procedure to create a connection between a blood vessel that carries blood away from your heart (artery) and a blood vessel that returns blood to your heart (vein). The connection is called a fistula. It is often made in the forearm or upper arm. You may need this procedure if you are getting hemodialysis treatments for kidney disease. An  AV fistula makes your vein larger and stronger over several months. This makes the vein a safe and easy spot to insert the needles that are used for hemodialysis. Tell a health care provider about:  Any allergies you have.  All medicines you are taking, including vitamins, herbs, eye drops, creams, and over-the-counter medicines.  Any problems you or family members have had with anesthetic medicines.  Any blood disorders you have.  Any surgeries you have had.  Any medical conditions you have. What are the risks? Generally, this is a safe procedure. However, problems may occur, including:  Infection.  Blood clot (thrombosis).  Reduced blood flow (stenosis).  Weakening or ballooning out of the fistula (aneurysm).  Bleeding.  Allergic reactions to medicines.  Nerve damage.  Swelling near the fistula (lymphedema).  Weakening of your heart (congestive heart failure).  Failure of the procedure. What happens before the procedure?  Imaging tests of your arm may be done to find the best place for the fistula.  Ask your health care provider about:  Changing or stopping your regular medicines. This is especially important if you are taking diabetes medicines or blood thinners.  Taking medicines such as aspirin and ibuprofen. These medicines can thin your blood. Do not take these medicines before your procedure if your health care provider instructs you not to.  Follow instructions from your health care provider about eating or drinking restrictions.  You may be given antibiotic medicine to help prevent infection.  Ask your health care provider how your surgical site will be marked or identified.  Plan to have someone take you home after the procedure. What happens during the procedure?  To reduce your risk of infection:  Your health care team will wash or sanitize their hands.  Your skin will be washed with soap.  Hair may be removed from the surgical area.  An IV  tube will be started in one of your veins.  You will be given one or more of the following:  A medicine to help you relax (sedative).  A medicine to numb the area (local anesthetic).  A medicine to make you fall asleep (general anesthetic).  A medicine that is injected into an area of your body to numb everything below the injection site (regional anesthetic).  The fistula site will be cleaned with a germ-killing solution (antiseptic).  A cut (incision) will be made on the inner side of your arm.  A vein and an artery will be opened and connected with stitches (sutures).  The incision will be closed with sutures or clips.  A bandage (dressing) will be placed  over the area. The procedure may vary among health care providers and hospitals. What happens after the procedure?  Your blood pressure, heart rate, breathing rate, and blood oxygen level will be monitored often until the medicines you were given have worn off.  Your fistula site will be checked for bleeding or swelling.  You will be given pain medication as needed.  Do not drive for 24 hours if you received a sedative. This information is not intended to replace advice given to you by your health care provider. Make sure you discuss any questions you have with your health care provider. Document Released: 05/03/2015 Document Revised: 10/28/2015 Document Reviewed: 08/12/2014  2017 Elsevier

## 2016-05-23 ENCOUNTER — Encounter (HOSPITAL_COMMUNITY)
Admission: RE | Admit: 2016-05-23 | Discharge: 2016-05-23 | Disposition: A | Discharge status: Home / Self Care | Payer: Managed Care, Other (non HMO) | Source: Ambulatory Visit | Attending: Surgery | Admitting: Surgery

## 2016-05-25 ENCOUNTER — Encounter (HOSPITAL_COMMUNITY)
Admission: RE | Disposition: A | Discharge status: Home / Self Care | Payer: Self-pay | Source: Ambulatory Visit | Attending: Surgery

## 2016-05-25 ENCOUNTER — Encounter (HOSPITAL_COMMUNITY): Payer: Self-pay | Admitting: *Deleted

## 2016-05-25 ENCOUNTER — Ambulatory Visit (HOSPITAL_COMMUNITY): Payer: Managed Care, Other (non HMO) | Admitting: Anesthesiology

## 2016-05-25 ENCOUNTER — Ambulatory Visit (HOSPITAL_COMMUNITY)
Admission: RE | Admit: 2016-05-25 | Discharge: 2016-05-25 | Disposition: A | Discharge status: Home / Self Care | Payer: Managed Care, Other (non HMO) | Source: Ambulatory Visit | Attending: Surgery | Admitting: Surgery

## 2016-05-25 DIAGNOSIS — I120 Hypertensive chronic kidney disease with stage 5 chronic kidney disease or end stage renal disease: Secondary | ICD-10-CM | POA: Insufficient documentation

## 2016-05-25 DIAGNOSIS — E1122 Type 2 diabetes mellitus with diabetic chronic kidney disease: Secondary | ICD-10-CM | POA: Diagnosis not present

## 2016-05-25 DIAGNOSIS — N186 End stage renal disease: Secondary | ICD-10-CM | POA: Insufficient documentation

## 2016-05-25 DIAGNOSIS — M1990 Unspecified osteoarthritis, unspecified site: Secondary | ICD-10-CM | POA: Diagnosis not present

## 2016-05-25 DIAGNOSIS — K219 Gastro-esophageal reflux disease without esophagitis: Secondary | ICD-10-CM | POA: Insufficient documentation

## 2016-05-25 DIAGNOSIS — Z87891 Personal history of nicotine dependence: Secondary | ICD-10-CM | POA: Insufficient documentation

## 2016-05-25 DIAGNOSIS — Z6837 Body mass index (BMI) 37.0-37.9, adult: Secondary | ICD-10-CM | POA: Insufficient documentation

## 2016-05-25 DIAGNOSIS — I350 Nonrheumatic aortic (valve) stenosis: Secondary | ICD-10-CM | POA: Diagnosis not present

## 2016-05-25 DIAGNOSIS — M797 Fibromyalgia: Secondary | ICD-10-CM | POA: Insufficient documentation

## 2016-05-25 HISTORY — PX: AV FISTULA PLACEMENT: SHX1204

## 2016-05-25 LAB — BASIC METABOLIC PANEL
ANION GAP: 10 (ref 5–15)
BUN: 34 mg/dL — AB (ref 6–20)
CALCIUM: 9.4 mg/dL (ref 8.9–10.3)
CO2: 26 mmol/L (ref 22–32)
Chloride: 102 mmol/L (ref 101–111)
Creatinine, Ser: 5.31 mg/dL — ABNORMAL HIGH (ref 0.61–1.24)
GFR calc Af Amer: 12 mL/min — ABNORMAL LOW (ref >60)
GFR, EST NON AFRICAN AMERICAN: 11 mL/min — AB (ref >60)
GLUCOSE: 115 mg/dL — AB (ref 65–99)
Potassium: 4.6 mmol/L (ref 3.5–5.1)
Sodium: 138 mmol/L (ref 135–145)

## 2016-05-25 LAB — GLUCOSE, CAPILLARY: GLUCOSE-CAPILLARY: 90 mg/dL (ref 65–99)

## 2016-05-25 LAB — CBC WITH DIFFERENTIAL/PLATELET
BASOS PCT: 1 %
Basophils Absolute: 0.1 10*3/uL (ref 0.0–0.1)
EOS PCT: 4 %
Eosinophils Absolute: 0.3 10*3/uL (ref 0.0–0.7)
HEMATOCRIT: 38.2 % — AB (ref 39.0–52.0)
Hemoglobin: 12.2 g/dL — ABNORMAL LOW (ref 13.0–17.0)
LYMPHS PCT: 11 %
Lymphs Abs: 0.8 10*3/uL (ref 0.7–4.0)
MCH: 32.4 pg (ref 26.0–34.0)
MCHC: 31.9 g/dL (ref 30.0–36.0)
MCV: 101.6 fL — AB (ref 78.0–100.0)
MONO ABS: 0.6 10*3/uL (ref 0.1–1.0)
Monocytes Relative: 8 %
NEUTROS ABS: 5.9 10*3/uL (ref 1.7–7.7)
Neutrophils Relative %: 76 %
PLATELETS: 147 10*3/uL — AB (ref 150–400)
RBC: 3.76 MIL/uL — ABNORMAL LOW (ref 4.22–5.81)
RDW: 15 % (ref 11.5–15.5)
WBC: 7.6 10*3/uL (ref 4.0–10.5)

## 2016-05-25 SURGERY — ARTERIOVENOUS (AV) FISTULA CREATION
Anesthesia: Monitor Anesthesia Care | Site: Arm Lower | Laterality: Left

## 2016-05-25 MED ORDER — FENTANYL CITRATE (PF) 100 MCG/2ML IJ SOLN
INTRAMUSCULAR | Status: DC | PRN
Start: 2016-05-25 — End: 2016-05-25
  Administered 2016-05-25 (×4): 25 ug via INTRAVENOUS

## 2016-05-25 MED ORDER — CEFAZOLIN IN D5W 1 GM/50ML IV SOLN: 1.0000 g | Freq: Once | INTRAVENOUS | Status: DC

## 2016-05-25 MED ORDER — SODIUM CHLORIDE 0.9 % IJ SOLN
INTRAMUSCULAR | Status: AC
  Filled 2016-05-25: qty 10

## 2016-05-25 MED ORDER — SODIUM CHLORIDE 0.9 % IV SOLN
INTRAVENOUS | Status: DC | PRN
  Administered 2016-05-25: 300 mL

## 2016-05-25 MED ORDER — HEPARIN SODIUM (PORCINE) 1000 UNIT/ML IJ SOLN
INTRAMUSCULAR | Status: AC
  Filled 2016-05-25: qty 5

## 2016-05-25 MED ORDER — FENTANYL CITRATE (PF) 100 MCG/2ML IJ SOLN
INTRAMUSCULAR | Status: AC
  Filled 2016-05-25: qty 2

## 2016-05-25 MED ORDER — HEMOSTATIC AGENTS (NO CHARGE) OPTIME
TOPICAL | Status: DC | PRN
  Administered 2016-05-25: 1 via TOPICAL

## 2016-05-25 MED ORDER — DEXTROSE 5 % IV SOLN
3.0000 g | INTRAVENOUS | Status: DC
  Administered 2016-05-25: 3 g via INTRAVENOUS
  Filled 2016-05-25: qty 3000

## 2016-05-25 MED ORDER — LIDOCAINE HCL (PF) 1 % IJ SOLN
INTRAMUSCULAR | Status: AC
  Filled 2016-05-25: qty 5

## 2016-05-25 MED ORDER — SODIUM CHLORIDE 0.9 % IV SOLN
Freq: Once | INTRAVENOUS | Status: AC
  Administered 2016-05-25: 250 mL via INTRAVENOUS

## 2016-05-25 MED ORDER — BUPIVACAINE HCL (PF) 0.5 % IJ SOLN
INTRAMUSCULAR | Status: AC
  Filled 2016-05-25: qty 30

## 2016-05-25 MED ORDER — HEPARIN SODIUM (PORCINE) 5000 UNIT/ML IJ SOLN
Freq: Once | INTRAMUSCULAR | Status: DC
  Filled 2016-05-25: qty 1.2

## 2016-05-25 MED ORDER — LIDOCAINE HCL (CARDIAC) 10 MG/ML IV SOLN
INTRAVENOUS | Status: DC | PRN
  Administered 2016-05-25: 30 mg via INTRAVENOUS

## 2016-05-25 MED ORDER — CEFAZOLIN SODIUM-DEXTROSE 2-4 GM/100ML-% IV SOLN
INTRAVENOUS | Status: AC
  Filled 2016-05-25: qty 100

## 2016-05-25 MED ORDER — CEFAZOLIN IN D5W 1 GM/50ML IV SOLN
INTRAVENOUS | Status: AC
  Filled 2016-05-25: qty 50

## 2016-05-25 MED ORDER — MIDAZOLAM HCL 2 MG/2ML IJ SOLN
INTRAMUSCULAR | Status: AC
  Filled 2016-05-25: qty 2

## 2016-05-25 MED ORDER — LIDOCAINE HCL (PF) 1 % IJ SOLN
INTRAMUSCULAR | Status: AC
  Filled 2016-05-25: qty 30

## 2016-05-25 MED ORDER — SODIUM CHLORIDE 0.9 % IV SOLN
INTRAVENOUS | Status: DC | PRN
  Administered 2016-05-25: 07:00:00 via INTRAVENOUS

## 2016-05-25 MED ORDER — CHLORHEXIDINE GLUCONATE CLOTH 2 % EX PADS: 6.0000 | MEDICATED_PAD | Freq: Once | CUTANEOUS | Status: DC

## 2016-05-25 MED ORDER — CEFAZOLIN SODIUM-DEXTROSE 2-4 GM/100ML-% IV SOLN: 2.0000 g | Freq: Once | INTRAVENOUS | Status: DC

## 2016-05-25 MED ORDER — HEPARIN SODIUM (PORCINE) 1000 UNIT/ML IJ SOLN
INTRAMUSCULAR | Status: DC | PRN
  Administered 2016-05-25: 2 mL via INTRAVENOUS

## 2016-05-25 MED ORDER — FENTANYL CITRATE (PF) 100 MCG/2ML IJ SOLN
25.0000 ug | INTRAMUSCULAR | Status: AC | PRN
  Administered 2016-05-25 (×2): 25 ug via INTRAVENOUS

## 2016-05-25 MED ORDER — HYDROMORPHONE HCL 1 MG/ML IJ SOLN: 0.2500 mg | INTRAMUSCULAR | Status: DC | PRN

## 2016-05-25 MED ORDER — MIDAZOLAM HCL 2 MG/2ML IJ SOLN
1.0000 mg | INTRAMUSCULAR | Status: DC | PRN
  Administered 2016-05-25: 2 mg via INTRAVENOUS

## 2016-05-25 MED ORDER — LIDOCAINE HCL 1 % IJ SOLN
INTRAMUSCULAR | Status: DC | PRN
  Administered 2016-05-25: 10 mL via INTRAMUSCULAR

## 2016-05-25 MED ORDER — PROPOFOL 10 MG/ML IV BOLUS
INTRAVENOUS | Status: AC
  Filled 2016-05-25: qty 40

## 2016-05-25 SURGICAL SUPPLY — 42 items
ADH SKN CLS APL DERMABOND .7 (GAUZE/BANDAGES/DRESSINGS) ×1
BAG HAMPER (MISCELLANEOUS) ×2 IMPLANT
BNDG COHESIVE 4X5 TAN STRL (GAUZE/BANDAGES/DRESSINGS) ×1 IMPLANT
CATH EMB 2FR 60CM (CATHETERS) IMPLANT
CATH EMB 3FR 40CM (CATHETERS) IMPLANT
CATH EMB 4FR 40CM (CATHETERS) IMPLANT
CHLORAPREP W/TINT 26ML (MISCELLANEOUS) ×4 IMPLANT
CLIP TI MEDIUM 6 (CLIP) IMPLANT
CLIP TI WIDE RED SMALL 6 (CLIP) IMPLANT
CLOTH BEACON ORANGE TIMEOUT ST (SAFETY) ×2 IMPLANT
COVER LIGHT HANDLE STERIS (MISCELLANEOUS) ×5 IMPLANT
DECANTER SPIKE VIAL GLASS SM (MISCELLANEOUS) ×4 IMPLANT
DERMABOND ADVANCED (GAUZE/BANDAGES/DRESSINGS) ×1
DERMABOND ADVANCED .7 DNX12 (GAUZE/BANDAGES/DRESSINGS) ×1 IMPLANT
DRAPE ORTHO 2.5IN SPLIT 77X108 (DRAPES) ×1 IMPLANT
DRAPE ORTHO SPLIT 77X108 STRL (DRAPES) ×2
DRAPE PROXIMA HALF (DRAPES) ×2 IMPLANT
GLOVE BIOGEL PI IND STRL 7.0 (GLOVE) ×1 IMPLANT
GLOVE BIOGEL PI IND STRL 7.5 (GLOVE) ×1 IMPLANT
GLOVE BIOGEL PI INDICATOR 7.0 (GLOVE) ×1
GLOVE BIOGEL PI INDICATOR 7.5 (GLOVE) ×1
GLOVE ECLIPSE 6.5 STRL STRAW (GLOVE) ×2 IMPLANT
GLOVE ECLIPSE 7.0 STRL STRAW (GLOVE) ×2 IMPLANT
GOWN STRL REUS W/TWL LRG LVL3 (GOWN DISPOSABLE) ×4 IMPLANT
HEMOSTAT SURGICEL 4X8 (HEMOSTASIS) ×1 IMPLANT
IV CATH 18G SAFETY (IV SOLUTION) ×3 IMPLANT
KIT BLADEGUARD II DBL (SET/KITS/TRAYS/PACK) ×2 IMPLANT
KIT ROOM TURNOVER APOR (KITS) ×2 IMPLANT
MANIFOLD NEPTUNE II (INSTRUMENTS) ×2 IMPLANT
MARKER SKIN DUAL TIP RULER LAB (MISCELLANEOUS) ×5 IMPLANT
PACK CV ACCESS (CUSTOM PROCEDURE TRAY) ×2 IMPLANT
PAD ARMBOARD 7.5X6 YLW CONV (MISCELLANEOUS) ×2 IMPLANT
SET BASIN LINEN APH (SET/KITS/TRAYS/PACK) ×2 IMPLANT
SPONGE SURGIFOAM ABS GEL 100 (HEMOSTASIS) IMPLANT
STOCKINETTE IMPERVIOUS LG (DRAPES) ×1 IMPLANT
SUT MNCRL AB 4-0 PS2 18 (SUTURE) ×2 IMPLANT
SUT PROLENE 6 0 C 1 30 (SUTURE) ×2 IMPLANT
SUT PROLENE 7 0 BV 1 (SUTURE) IMPLANT
SUT VICRYL AB 3-0 FS1 BRD 27IN (SUTURE) ×2 IMPLANT
SYR CONTROL 10ML LL (SYRINGE) ×2 IMPLANT
VESSEL LOOPS MAXI RED (MISCELLANEOUS) ×2 IMPLANT
VESSEL LOOPS MINI BLUE (MISCELLANEOUS) ×2 IMPLANT

## 2016-05-25 NOTE — Anesthesia Preprocedure Evaluation (Addendum)
Anesthesia Evaluation  Patient identified by MRN, date of birth, ID band Patient awake    Reviewed: Allergy & Precautions, NPO status , Patient's Chart, lab work & pertinent test results  History of Anesthesia Complications (+) AWARENESS UNDER ANESTHESIANegative for: history of anesthetic complications (Awareness during MAC anesthesia and became very agitated.)  Airway Mallampati: II  TM Distance: >3 FB Neck ROM: Full    Dental  (+) Partial Upper, Dental Advisory Given   Pulmonary shortness of breath and with exertion, sleep apnea (does not use CPAP) , COPD,  COPD inhaler, Current Smoker, former smoker,    breath sounds clear to auscultation       Cardiovascular hypertension, Pt. on medications (-) angina+ CAD, + Past MI and +CHF   Rhythm:Regular Rate:Normal  9/16 ECHO: EF 35% to 40%. Aortic valve: There was very mild stenosis   Neuro/Psych Anxiety negative neurological ROS     GI/Hepatic Neg liver ROS, GERD  Medicated and Controlled,  Endo/Other  diabetes, Type 2, Oral Hypoglycemic AgentsMorbid obesity  Renal/GU ESRFRenal disease     Musculoskeletal  (+) Arthritis , Osteoarthritis,  Fibromyalgia -  Abdominal (+) + obese,   Peds  Hematology  (+) Blood dyscrasia, ,   Anesthesia Other Findings   Reproductive/Obstetrics                            Anesthesia Physical Anesthesia Plan  ASA: III  Anesthesia Plan: MAC   Post-op Pain Management:    Induction: Intravenous  Airway Management Planned: Simple Face Mask  Additional Equipment:   Intra-op Plan:   Post-operative Plan:   Informed Consent: I have reviewed the patients History and Physical, chart, labs and discussed the procedure including the risks, benefits and alternatives for the proposed anesthesia with the patient or authorized representative who has indicated his/her understanding and acceptance.   Dental advisory  given  Plan Discussed with: CRNA and Surgeon  Anesthesia Plan Comments:         Anesthesia Quick Evaluation

## 2016-05-25 NOTE — Anesthesia Postprocedure Evaluation (Signed)
Anesthesia Post Note  Patient: Mark [name]  Procedure(s) Performed: Procedure(s) (LRB): ARTERIOVENOUS (AV) FISTULA CREATION (Left)  Patient location during evaluation: PACU Anesthesia Type: MAC Level of consciousness: awake and alert Pain management: satisfactory to patient Vital Signs Assessment: post-procedure vital signs reviewed and stable Cardiovascular status: stable Anesthetic complications: no     Last Vitals:  Vitals:   05/25/16 1055 05/25/16 1100  BP: (!) 158/44 (!) 168/83  Pulse: 68 67  Resp: 14 10  Temp: 36.7 C     Last Pain:  Vitals:   05/25/16 0702  TempSrc: Oral  PainSc: 3                  Ivalene Platte

## 2016-05-25 NOTE — OR Nursing (Signed)
Dr. Rosana Hoes began marking Left upper extremity with ultrasound at 0755.

## 2016-05-25 NOTE — Interval H&P Note (Signed)
History and Physical Interval Note:  05/25/2016 7:27 AM  Mark [name]  has presented today for surgery, with the diagnosis of end stage renal disease  The various methods of treatment have been discussed with the patient and family. After consideration of risks, benefits and other options for treatment, the patient has consented to  Procedure(s): ARTERIOVENOUS (AV) FISTULA CREATION (Left) as a surgical intervention .  The patient's history has been reviewed, patient examined, no change in status, stable for surgery.  I have reviewed the patient's chart and labs.  Questions were answered to the patient's satisfaction.     [name]

## 2016-05-25 NOTE — Anesthesia Procedure Notes (Signed)
Procedure Name: MAC Date/Time: 05/25/2016 7:40 AM Performed by: Vista Deck Pre-anesthesia Checklist: Patient identified, Emergency Drugs available, Suction available, Timeout performed and Patient being monitored Patient Re-evaluated:Patient Re-evaluated prior to inductionOxygen Delivery Method: Nasal Cannula

## 2016-05-25 NOTE — Discharge Instructions (Addendum)
AV Fistula Placement, Care After Refer to this sheet in the next few weeks. These instructions provide you with information about caring for yourself after your procedure. Your health care provider may also give you more specific instructions. Your treatment has been planned according to current medical practices, but problems sometimes occur. Call your health care provider if you have any problems or questions after your procedure. What can I expect after the procedure? After your procedure, it is common to:  Feel sore.  Have numbness.  Feel cold.  Feel a vibration (thrill) over the fistula. Follow these instructions at home:   Incision care  Do not take baths or showers, swim, or use a hot tub until your health care provider approves.  Keep the area around your cut from surgery (incision) clean and dry.  Follow instructions from your health care provider about how to take care of your incision. Make sure you:  Wash your hands with soap and water before you change your bandage (dressing). If soap and water are not available, use hand sanitizer.  Change your dressing as told by your health care provider.  Leave stitches (sutures) and clips in place. They may need to stay in place for 2 weeks or longer. Fistula Care  Check your fistula site every day to make sure the thrill feels the same.  Check your fistula site every day for signs of infection. Watch for:  Redness.  Swelling.  Discharge.  Tenderness.  Enlargement.  Keep your arm raised (elevated) while you rest.  Do not lift anything that is heavier than a gallon of milk with the arm that has the fistula.  Do not lie down on your fistula arm.  Do not let anyone draw blood or take a blood pressure reading on your fistula arm.  Do not wear tight jewelry or clothing over your fistula arm. General instructions  Rest at home for a day or two.  Gradually start doing your usual activities again. Ask your surgeon  when you can return to work or school.  Take over-the-counter and prescription medicines only as told by your health care provider.  Keep all follow-up visits as told by your health care provider. This is important. Contact a health care provider if:  You have chills or a fever.  You have pain at your fistula site that is not going away.  You have numbness or coldness at your fistula site that is not going away.  You feel a decrease or a change in the thrill.  You have swelling in your arm or hand.  You have redness, swelling, discharge, tenderness, or enlargement at your fistula site. Get help right away if:  You are bleeding from your fistula site.  You have chest pain.  You have trouble breathing. This information is not intended to replace advice given to you by your health care provider. Make sure you discuss any questions you have with your health care provider. Document Released: 05/22/2005 Document Revised: 09/29/2015 Document Reviewed: 08/12/2014 Elsevier Interactive Patient Education  2017 [employer]. In addition to included general post-operative instructions for Creation of Left radial-cephalic AV Fistula for durable hemodialysis access,  Diet: Resume home heart healthy Renal diet.   Activity: No heavy lifting >20 pounds (children, pets, laundry, garbage) or strenuous activity until follow-up, but light activity and walking are encouraged. Do not drive or drink alcohol if taking narcotic pain medications.  Wound care: 2 days after surgery (Saturday, 12/23), may get incision wet with soapy water  and pat dry (do not rub incisions), but no baths or submerging incision underwater until follow-up and must continue to keep tunneled hemodialysis catheter clean and dry (so no showers while catheter remains indwelling).   Medications: Resume all home medications. For mild to moderate pain: acetaminophen (Tylenol). Narcotic pain medications, if prescribed, can be used for  severe pain, though may cause nausea, constipation, and drowsiness. Do not combine Tylenol and Percocet within a 6 hour period as Percocet contains Tylenol. If you do not need the narcotic pain medication, you do not need to fill the prescription.  Call office [phone]) at any time if any questions, worsening pain, fevers/chills, bleeding, drainage from incision site, or other concerns.

## 2016-05-25 NOTE — Transfer of Care (Signed)
Immediate Anesthesia Transfer of Care Note  Patient: Tramaine Sauls  Procedure(s) Performed: Procedure(s): ARTERIOVENOUS (AV) FISTULA CREATION (Left)  Patient Location: PACU  Anesthesia Type:MAC  Level of Consciousness: awake and patient cooperative  Airway & Oxygen Therapy: Patient Spontanous Breathing and Patient connected to nasal cannula oxygen  Post-op Assessment: Report given to RN and Post -op Vital signs reviewed and stable  Post vital signs: Reviewed and stable  Last Vitals:  Vitals:   05/25/16 0730 05/25/16 0735  BP: (!) 153/97 (!) 168/91  Pulse:    Resp: (!) 23 (!) 26  Temp:      Last Pain:  Vitals:   05/25/16 0702  TempSrc: Oral  PainSc: 3       Patients Stated Pain Goal: 5 (38/38/18 4037)  Complications: No apparent anesthesia complications

## 2016-05-25 NOTE — Op Note (Signed)
SURGICAL OPERATIVE REPORT   DATE OF PROCEDURE: 05/25/2016   ATTENDING SURGEON: Corene Cornea E. Rosana Hoes, MD   ANESTHESIA: Local with light IV sedation  PRE-OPERATIVE DIAGNOSIS: End-stage renal disease requiring durable hemodialysis access (icd-10's: N18.6)  POST-OPERATIVE DIAGNOSIS: End-stage renal disease requiring durable hemodialysis access (icd-10's: N18.6)  PROCEDURE(S): Creation of Left upper extremity radial-cephalic AV fistula (cpt: 09983)  INTRAOPERATIVE FINDINGS: Radial artery and cephalic vein suitable for creation of AV fistula for autogenous hemodialysis access   INTRAOPERATIVE FLUIDS: 150 mL crystalloid   ESTIMATED BLOOD LOSS: Minimal (<30 mL)   URINE OUTPUT: No foley   SPECIMENS: None   IMPLANTS: None   DRAINS: None   COMPLICATIONS: None apparent   CONDITION AT COMPLETION: Hemodynamically stable and awake  PULSE/DOPPLER EXAM AT END OF PROCEDURE:   (p=palpable; d=doppler signals; 0=none)     Right   Left   AVF    easily palpable non-pulsatile thrill  Brach  p   p   Rad  p   p   DISPOSITION: PACU   INDICATION(S) FOR PROCEDURE:  Patient is a Right-handed 60 y.o. male with end-stage renal disease currently undergoing hemodialysis via a Left IJ tunneled catheter requiring durable hemodialysis access s/p non-maturation of Left radial-cephalic AV fistula created 02/02/2015. Ultrasound vein mapping and physical exam suggested Left forearm cephalic vein suitable for creation of an autogenous AV fistula. Duplex vein mapping was repeated upon administration of anesthesia with essentially the same findings. All risks, benefits, and alternatives to above elective procedures were discussed with the patient, who elected to proceed, and informed consent was accordingly obtained at that time.   DETAILS OF PROCEDURE:  Patient was brought to the operating suite and appropriately identified. Light IV sedation was administered along with peri-operative prophylactic IV antibiotics. In  supine position, operative site was prepped and draped in usual sterile fashion, and following a brief time out, initial 3 cm longitudinal skin incision was made along the marked cephalic vein at the patient's wrist using a #15 blade scalpel and extended deep through subcutaneous tissues and fascia using electrocaudery and sharp dissection, which were used to circumferentially dissect the visualized cephalic vein free from surrounding tissues. Attention was then directed to the radial artery, over which a second 1.5 cm longitudinal skin incision was made using a #15 blade scalpel and extended deep through subcutaneous tissues using sharp and blunt dissection, along with electrocautery. Fascia was then incised, and the artery was exposed and circumfererntially dissected free of surrounding tissue. Anterior vein surface was marked, and distally exposed vein was ligated with 3-0 silk suture ties and divided, after which the proximal vein was tunneled to the arterial incision, confirmed to flush easily with a palpable non-pulsatile thrill during flush, and lay adjacent to the artery without twisting or kinks.   Only 2000 Units of heparin were administered intravenously by anesthesiologist (since patient continued to take his dual antiplatelet aspirin and Brilinta), and silastic vessel loops along with atraumatic vascular clamps were used to secure proximal and distal control of the artery. An approximately 6 mm arteriotomy was created. Distal end of the vein was spatulated, and a running continuous anastomosis was performed between artery and vein using 6-0 Prolene suture from the apex and base of the arteriotomy. Prior to completion of anastomosis, outflow was backward bled and inflow was forward bled, anastomosis was flushed with heparinized saline, and anastomotic sutures were tied. Hemostasis was achieved with topical hemostatic agents as needed, and distal pulses and a thrill over the AV fistula outflow  were  confirmed. All instrument and sponge counts were confirmed, and incisions were reapproximated in layers with 3-0 Vicryl suture for soft tissue, followed by running subcuticular 4-0 Vicryl suture for skin. Skin was cleaned, dried, and skin glue was applied. Patient was then safely able to be awakened and transferred to PACU for post-operative monitoring and care.   I was present for all aspects of procedure, and there were no intra-operative complications apparent.

## 2016-05-28 ENCOUNTER — Emergency Department (HOSPITAL_COMMUNITY)
Admission: EM | Admit: 2016-05-28 | Discharge: 2016-05-29 | Disposition: A | Discharge status: Home / Self Care | Payer: Managed Care, Other (non HMO) | Attending: Emergency Medicine | Admitting: Emergency Medicine

## 2016-05-28 ENCOUNTER — Encounter (HOSPITAL_COMMUNITY): Payer: Self-pay | Admitting: *Deleted

## 2016-05-28 DIAGNOSIS — X58XXXA Exposure to other specified factors, initial encounter: Secondary | ICD-10-CM | POA: Insufficient documentation

## 2016-05-28 DIAGNOSIS — I509 Heart failure, unspecified: Secondary | ICD-10-CM | POA: Insufficient documentation

## 2016-05-28 DIAGNOSIS — T148XXA Other injury of unspecified body region, initial encounter: Secondary | ICD-10-CM

## 2016-05-28 DIAGNOSIS — Y929 Unspecified place or not applicable: Secondary | ICD-10-CM | POA: Diagnosis not present

## 2016-05-28 DIAGNOSIS — I2510 Atherosclerotic heart disease of native coronary artery without angina pectoris: Secondary | ICD-10-CM | POA: Insufficient documentation

## 2016-05-28 DIAGNOSIS — Z87891 Personal history of nicotine dependence: Secondary | ICD-10-CM | POA: Insufficient documentation

## 2016-05-28 DIAGNOSIS — E1122 Type 2 diabetes mellitus with diabetic chronic kidney disease: Secondary | ICD-10-CM | POA: Diagnosis not present

## 2016-05-28 DIAGNOSIS — Y999 Unspecified external cause status: Secondary | ICD-10-CM | POA: Diagnosis not present

## 2016-05-28 DIAGNOSIS — Y939 Activity, unspecified: Secondary | ICD-10-CM | POA: Diagnosis not present

## 2016-05-28 DIAGNOSIS — Z79899 Other long term (current) drug therapy: Secondary | ICD-10-CM | POA: Diagnosis not present

## 2016-05-28 DIAGNOSIS — Z7984 Long term (current) use of oral hypoglycemic drugs: Secondary | ICD-10-CM | POA: Diagnosis not present

## 2016-05-28 DIAGNOSIS — N189 Chronic kidney disease, unspecified: Secondary | ICD-10-CM | POA: Insufficient documentation

## 2016-05-28 DIAGNOSIS — I130 Hypertensive heart and chronic kidney disease with heart failure and stage 1 through stage 4 chronic kidney disease, or unspecified chronic kidney disease: Secondary | ICD-10-CM | POA: Diagnosis not present

## 2016-05-28 DIAGNOSIS — M7989 Other specified soft tissue disorders: Secondary | ICD-10-CM | POA: Diagnosis present

## 2016-05-28 NOTE — ED Provider Notes (Signed)
[facility] DEPT Provider Note   CSN: [id number] Arrival date & time: 05/28/16  2319  By signing my name below, I, Mark [name], attest that this documentation has been prepared under the direction and in the presence of No att. providers found. Electronically signed, Mark [name], ED Scribe. 05/29/16. 12:16 AM.  Time seen 12:10 AM  History   Chief Complaint Chief Complaint  Patient presents with  . Post-op Problem    HPI HPI Comments: Mark [name] is a 60 y.o. male, with Hx of CKD, who presents to the Emergency Department for concerns s/p AV Fistula placement to left arm on 05/25/2016. Pt recently had an AV Fistula placed and started to have swelling to left posterior elbow. He denies any trauma to the area but states he was resting his arm on a table this evening for a long time and putting a lot of weight on the area. Pt states that his sensation is intact. He is also taking blood thinning medication. He denies any pain. No fever.Patient is right-handed.  The history is provided by the patient. No language interpreter was used.   PCP Dr Scotty Court Surgeon Dr Rosana Hoes  Past Medical History:  Diagnosis Date  . Anxiety    occas. panic attack, takes xanax occas  . Arthritis    herniated disc, lumbar   . Back pain   . CAD (coronary artery disease)    03/25/2015 95% prox to mid LAD treated with Promus Premier DES 3.5x10mm postdilated to 4.5mm, 100% prox to distal RCA lesion filled in the PDA region via collaterals from LAD and PLA  . Cancer ([place])    - skin ca on face- removed   . CHF (congestive heart failure) ([place])   . Chronic kidney disease   . Complication of anesthesia    " it takes a long time to get over it."   . Diabetes mellitus without complication ([place])    type 2  . Fibromyalgia   . GERD (gastroesophageal reflux disease)    otc- pepcid , approx. every other  day    . H/O acute respiratory failure   . Hematuria    being followed by Dr. Hinda Lenis for decreased kidney  function   . History of blood product transfusion 02/13/2014   After having back surgery  . Hypercholesteremia   . Hypertension   . Myocardial infarction 03/2015  . Pneumonia    "walking"  . Shortness of breath dyspnea   . Sleep apnea    test aborted, due to not able to relax , since the aborted test he had surgery for gallbladder & he reports that he was told that he has sleep apnea     Patient Active Problem List   Diagnosis Date Noted  . Ischemic cardiomyopathy   . Bacteremia   . Anxiety state 04/06/2016  . Diabetes mellitus type 2 in obese ([place]) 04/06/2016  . Bacteremia associated with intravascular line ([place]) 04/04/2016  . Abnormal stress test 03/25/2015  . Abnormal nuclear stress test   . Cardiomyopathy ([place])   . Chronic systolic heart failure ([place])   . ESRD (end stage renal disease) on dialysis ([place])   . Pre-operative cardiovascular examination   . Positive cardiac stress test 03/24/2015  . Essential hypertension   . Acute respiratory failure ([place]) 02/12/2015  . Renal failure   . Hyperkalemia   . Chronic kidney disease (CKD), stage IV (severe) ([place]) 01/22/2015  . HNP (herniated nucleus pulposus), lumbar 02/13/2014  . Abnormal ECG 02/12/2014  .  HTN (hypertension) 02/12/2014  . Bradycardia 02/12/2014    Past Surgical History:  Procedure Laterality Date  . AV FISTULA PLACEMENT Left 02/02/2015   Procedure: LEFT ARM RADIOCEPHALIC ARTERIOVENOUS (AV) FISTULA CREATION;  Surgeon: Conrad Wallis, MD;  Location: [facility];  Service: Vascular;  Laterality: Left;  . BACK SURGERY  02/13/14  . CARDIAC CATHETERIZATION N/A 03/25/2015   Procedure: Left Heart Cath and Coronary Angiography;  Surgeon: Leonie Man, MD;  Location: [facility];  Service: Cardiovascular;  Laterality: N/A;  . CARDIAC CATHETERIZATION N/A 03/25/2015   Procedure: Coronary Stent Intervention;  Surgeon: Leonie Man, MD;  Location: [facility];  Service: Cardiovascular;  Laterality: N/A;  .  CHOLECYSTECTOMY    . COLONOSCOPY    . EYE SURGERY     cataracts remove, bilateral, w/IOL.  Marland Kitchen INSERTION OF DIALYSIS CATHETER N/A 03/09/2015   Procedure: INSERTION OF DIALYSIS CATHETER;  Surgeon: Conrad Fort Lewis, MD;  Location: [facility];  Service: Vascular;  Laterality: N/A;  . INSERTION OF DIALYSIS CATHETER Left 04/07/2016   Procedure: INSERTION OF TUNNELED DIALYSIS CATHETER LEFT INTERNAL JUGULAR, ATTEMPTED INSERTION OF TUNNELLED CATHETER RIGHT INTERNAL JUGULAR;  Surgeon: [name], MD;  Location: [facility];  Service: Vascular;  Laterality: Left;  . LUMBAR LAMINECTOMY/DECOMPRESSION MICRODISCECTOMY Right 02/13/2014   Procedure: RIGHT LUMBAR FOUR-FIVE, RIGHT LUMBAR FIVE- SACRAL ONE LUMBAR LAMINECTOMY/DECOMPRESSION MICRODISCECTOMY ;  Surgeon: Ashok Pall, MD;  Location: [facility];  Service: Neurosurgery;  Laterality: Right;  Right L4-5 and Right L5-S1 diskectomies  . TEE WITHOUT CARDIOVERSION N/A 05/02/2016   Procedure: TRANSESOPHAGEAL ECHOCARDIOGRAM (TEE);  Surgeon: Herminio Commons, MD;  [facility]: [facility];  Service: Cardiovascular;  Laterality: N/A;  . TONSILLECTOMY         Home Medications    Prior to Admission medications   Medication Sig Start Date End Date Taking? Authorizing Provider  acetaminophen (TYLENOL) 500 MG tablet Take 500 mg by mouth every 6 (six) hours as needed for mild pain or moderate pain.    Historical Provider, MD  ALPRAZolam Duanne Moron) 1 MG tablet Take 1 mg by mouth 2 (two) times daily as needed for anxiety.  12/21/14   Historical Provider, MD  aspirin EC 81 MG tablet Take 81 mg by mouth every evening.     Historical Provider, MD  atorvastatin (LIPITOR) 40 MG tablet Take 40 mg by mouth at bedtime.    Historical Provider, MD  B Complex-C-Folic Acid (RENA-VITE RX) 1 MG TABS Take 1 tablet by mouth every evening.  04/25/16   Historical Provider, MD  BRILINTA 90 MG TABS tablet TAKE ONE TABLET BY MOUTH TWICE DAILY. 04/10/16   Herminio Commons, MD  calcium carbonate  (TUMS - DOSED IN MG ELEMENTAL CALCIUM) 500 MG chewable tablet Chew 2-3 tablets by mouth 3 (three) times daily with meals.     Historical Provider, MD  furosemide (LASIX) 40 MG tablet Take 40 mg by mouth See admin instructions. 40 mg twice daily  on Tuesdays, Thursdays, Saturdays, and Sundays **DO NOT TAKE LASIX ON DIALYSIS DAYS*    Historical Provider, MD  glipiZIDE (GLUCOTROL XL) 2.5 MG 24 hr tablet Take 2.5 mg by mouth daily. 03/23/16   Historical Provider, MD  hydrALAZINE (APRESOLINE) 50 MG tablet Take 1.5 tablets (75 mg total) by mouth 3 (three) times daily. Takes 1.5 tablets 05/08/16   Herminio Commons, MD  HYDROcodone-acetaminophen (NORCO) 10-325 MG per tablet Take 1 tablet by mouth every 6 (six) hours as needed for severe pain.  02/10/15   Historical Provider, MD  loratadine (CLARITIN) 10 MG tablet Take 10 mg by mouth daily as needed for allergies.    Historical Provider, MD  nitroGLYCERIN (NITRODUR - DOSED IN MG/24 HR) 0.2 mg/hr patch Place 0.2 mg onto the skin every Tuesday, Thursday, Saturday, and Sunday. Apply 1 patch in the morning and remove at night.    Historical Provider, MD  nitroGLYCERIN (NITROSTAT) 0.4 MG SL tablet Place 1 tablet (0.4 mg total) under the tongue every 5 (five) minutes as needed. Patient taking differently: Place 0.4 mg under the tongue every 5 (five) minutes as needed for chest pain.  03/23/15   Herminio Commons, MD  ranitidine (ZANTAC) 150 MG tablet Take 150 mg by mouth daily as needed for heartburn.    Historical Provider, MD  sodium bicarbonate 650 MG tablet Take 650 mg by mouth 3 (three) times daily.  01/09/15   Historical Provider, MD  traZODone (DESYREL) 50 MG tablet Take 50 mg by mouth at bedtime as needed for sleep.     Historical Provider, MD    Family History Family History  Problem Relation Age of Onset  . Adopted: Yes  . Prostate cancer Father   . Cancer Father     prostate  . Heart failure Maternal Grandmother   . Heart attack Maternal  Grandfather   . Colon cancer Maternal Grandfather     Social History Social History  Substance Use Topics  . Smoking status: Former Smoker    Packs/day: 0.50    Years: 40.00    Types: Cigarettes    Start date: 10/17/1973    Quit date: 02/07/2015  . Smokeless tobacco: Never Used  . Alcohol use No  lives with spouse   Allergies   Patient has no known allergies.   Review of Systems Review of Systems  Constitutional: Negative for fever.  Musculoskeletal: Negative for arthralgias and myalgias.  Skin: Negative for color change.  All other systems reviewed and are negative.    Physical Exam Updated Vital Signs BP 165/74   Pulse 82   Temp 97.5 F (36.4 C) (Oral)   Resp 16   Ht 6' (1.829 m)   Wt 280 lb (127 kg)   SpO2 95%   BMI 37.97 kg/m   Vital signs normal    Physical Exam  Constitutional: He is oriented to person, place, and time. He appears well-developed and well-nourished.  Non-toxic appearance. He does not appear ill. No distress.  HENT:  Head: Normocephalic and atraumatic.  Right Ear: External ear normal.  Left Ear: External ear normal.  Nose: Nose normal.  Mouth/Throat: Mucous membranes are normal.  Eyes: Conjunctivae and EOM are normal.  Neck: Normal range of motion and full passive range of motion without pain.  Cardiovascular: Normal rate.   No murmur heard. Pulmonary/Chest: Effort normal. No respiratory distress. He has no wheezes. He has no rhonchi. He has no rales. He exhibits no crepitus.  Abdominal: Normal appearance.  Musculoskeletal: Normal range of motion. He exhibits no edema or tenderness.  Moves all extremities well.   Patient's AV fistula is in his left wrist.  Neurological: He is alert and oriented to person, place, and time. He has normal strength. No cranial nerve deficit.  Skin: Skin is warm, dry and intact. No rash noted. No erythema. No pallor.  3x3 cm soft subcutaneous swelling. Not fixed to underlying structures. Appears non  painful.  Psychiatric: He has a normal mood and affect. His speech is normal and behavior  is normal. His mood appears not anxious.  Nursing note and vitals reviewed.       ED Treatments / Results   Procedures Procedures (including critical care time)  Medications Ordered in ED Medications - No data to display   Initial Impression / Assessment and Plan / ED Course  I have reviewed the triage vital signs and the nursing notes.  Pertinent labs & imaging results that were available during my care of the patient were reviewed by me and considered in my medical decision making (see chart for details).  Clinical Course   DIAGNOSTIC STUDIES: Oxygen Saturation is 95% on RA, normal by my interpretation.  COORDINATION OF CARE: 12:13 AM-Discussed treatment plan with pt at bedside and pt agreed to plan. Will discharge.   Patient was reassured this is not a complication of his surgery. Patient is on blood thinners. He reports he bruises very easily and states when he got the flu shot his whole chest and arm were bruised. I think this is from resting his elbow on the edge of the table for prolonged period of time and putting his weight on it. We discussed this is most likely blood underneath the skin and he could develop some bruising  over the next couple days and may even have the blood drift down towards his wrist. He was advised to keep his arm elevated and use ice packs. He should be rechecked if he gets fever, pain, worsening swelling.  Final Clinical Impressions(s) / ED Diagnoses   Final diagnoses:  Hematoma    Plan discharge  Rolland Porter, MD, FACEP  I personally performed the services described in this documentation, which was scribed in my presence. The recorded information has been reviewed and considered.  Rolland Porter, MD, Barbette Or, MD 05/29/16 [phone]

## 2016-05-28 NOTE — ED Triage Notes (Signed)
Pt had a fistula placed 05/25/2016, started having swelling just before left elbow tonight, pt denies any injury, denies any pain,

## 2016-05-29 NOTE — Discharge Instructions (Signed)
Elevate your arm. Use ice packs. You may see bruising develop over the next couple of days. You may even see bruising drifting down into your wrist area as gravity makes the blood settle downwards.  Recheck if you get a fever, increased redness, red streaks or it gets very painful.

## 2016-05-31 ENCOUNTER — Encounter (HOSPITAL_COMMUNITY): Payer: Self-pay | Admitting: Surgery

## 2016-07-15 ENCOUNTER — Other Ambulatory Visit: Payer: Self-pay | Admitting: Cardiovascular Disease

## 2016-07-27 ENCOUNTER — Other Ambulatory Visit (HOSPITAL_COMMUNITY): Payer: Self-pay | Admitting: Nephrology

## 2016-07-27 ENCOUNTER — Other Ambulatory Visit: Payer: Self-pay | Admitting: Radiology

## 2016-07-27 DIAGNOSIS — N186 End stage renal disease: Secondary | ICD-10-CM

## 2016-07-27 DIAGNOSIS — T829XXA Unspecified complication of cardiac and vascular prosthetic device, implant and graft, initial encounter: Secondary | ICD-10-CM

## 2016-07-27 MED ORDER — SODIUM CHLORIDE 0.9 % IV SOLN
INTRAVENOUS | Status: AC
  Administered 2018-03-08: 09:00:00 via INTRAVENOUS

## 2016-07-27 MED ORDER — DEXTROSE 5 % IV SOLN: 2.0000 g | Freq: Once | INTRAVENOUS | Status: AC

## 2016-07-28 ENCOUNTER — Encounter (HOSPITAL_COMMUNITY): Payer: Self-pay | Admitting: Interventional Radiology

## 2016-07-28 ENCOUNTER — Ambulatory Visit (HOSPITAL_COMMUNITY)
Admission: RE | Admit: 2016-07-28 | Discharge: 2016-07-28 | Disposition: A | Discharge status: Home / Self Care | Payer: Managed Care, Other (non HMO) | Source: Ambulatory Visit | Attending: Nephrology | Admitting: Nephrology

## 2016-07-28 DIAGNOSIS — Z7984 Long term (current) use of oral hypoglycemic drugs: Secondary | ICD-10-CM | POA: Diagnosis not present

## 2016-07-28 DIAGNOSIS — G4730 Sleep apnea, unspecified: Secondary | ICD-10-CM | POA: Insufficient documentation

## 2016-07-28 DIAGNOSIS — E7800 Pure hypercholesterolemia, unspecified: Secondary | ICD-10-CM | POA: Diagnosis not present

## 2016-07-28 DIAGNOSIS — I252 Old myocardial infarction: Secondary | ICD-10-CM | POA: Insufficient documentation

## 2016-07-28 DIAGNOSIS — Z7982 Long term (current) use of aspirin: Secondary | ICD-10-CM | POA: Diagnosis not present

## 2016-07-28 DIAGNOSIS — F410 Panic disorder [episodic paroxysmal anxiety] without agoraphobia: Secondary | ICD-10-CM | POA: Diagnosis not present

## 2016-07-28 DIAGNOSIS — M797 Fibromyalgia: Secondary | ICD-10-CM | POA: Insufficient documentation

## 2016-07-28 DIAGNOSIS — T829XXA Unspecified complication of cardiac and vascular prosthetic device, implant and graft, initial encounter: Secondary | ICD-10-CM

## 2016-07-28 DIAGNOSIS — T82524A Displacement of infusion catheter, initial encounter: Secondary | ICD-10-CM | POA: Diagnosis present

## 2016-07-28 DIAGNOSIS — E1122 Type 2 diabetes mellitus with diabetic chronic kidney disease: Secondary | ICD-10-CM | POA: Insufficient documentation

## 2016-07-28 DIAGNOSIS — N186 End stage renal disease: Secondary | ICD-10-CM | POA: Insufficient documentation

## 2016-07-28 DIAGNOSIS — M1990 Unspecified osteoarthritis, unspecified site: Secondary | ICD-10-CM | POA: Insufficient documentation

## 2016-07-28 DIAGNOSIS — I132 Hypertensive heart and chronic kidney disease with heart failure and with stage 5 chronic kidney disease, or end stage renal disease: Secondary | ICD-10-CM | POA: Insufficient documentation

## 2016-07-28 DIAGNOSIS — Z955 Presence of coronary angioplasty implant and graft: Secondary | ICD-10-CM | POA: Diagnosis not present

## 2016-07-28 DIAGNOSIS — Z87891 Personal history of nicotine dependence: Secondary | ICD-10-CM | POA: Diagnosis not present

## 2016-07-28 DIAGNOSIS — K219 Gastro-esophageal reflux disease without esophagitis: Secondary | ICD-10-CM | POA: Diagnosis not present

## 2016-07-28 DIAGNOSIS — Y712 Prosthetic and other implants, materials and accessory cardiovascular devices associated with adverse incidents: Secondary | ICD-10-CM | POA: Insufficient documentation

## 2016-07-28 DIAGNOSIS — I2510 Atherosclerotic heart disease of native coronary artery without angina pectoris: Secondary | ICD-10-CM | POA: Diagnosis not present

## 2016-07-28 DIAGNOSIS — I509 Heart failure, unspecified: Secondary | ICD-10-CM | POA: Insufficient documentation

## 2016-07-28 HISTORY — PX: IR GENERIC HISTORICAL: IMG1180011

## 2016-07-28 MED ORDER — CHLORHEXIDINE GLUCONATE 4 % EX LIQD
CUTANEOUS | Status: AC
  Filled 2016-07-28: qty 15

## 2016-07-28 MED ORDER — CEFAZOLIN SODIUM-DEXTROSE 2-4 GM/100ML-% IV SOLN
2.0000 g | Freq: Once | INTRAVENOUS | Status: AC
  Administered 2016-07-28: 2 g via INTRAVENOUS

## 2016-07-28 MED ORDER — GELATIN ABSORBABLE 12-7 MM EX MISC
CUTANEOUS | Status: AC
  Filled 2016-07-28: qty 1

## 2016-07-28 MED ORDER — FENTANYL CITRATE (PF) 100 MCG/2ML IJ SOLN
INTRAMUSCULAR | Status: AC
  Filled 2016-07-28: qty 2

## 2016-07-28 MED ORDER — FENTANYL CITRATE (PF) 100 MCG/2ML IJ SOLN
25.0000 ug | Freq: Once | INTRAMUSCULAR | Status: DC
Start: 2016-07-28 — End: 2016-07-29

## 2016-07-28 MED ORDER — HEPARIN SODIUM (PORCINE) 1000 UNIT/ML IJ SOLN
INTRAMUSCULAR | Status: DC | PRN
  Administered 2016-07-28: 4000 [IU]
  Administered 2016-07-28 (×2): 4200 [IU]

## 2016-07-28 MED ORDER — SODIUM CHLORIDE 0.9 % IV SOLN: INTRAVENOUS | Status: DC

## 2016-07-28 MED ORDER — DESMOPRESSIN ACETATE 4 MCG/ML IJ SOLN: 38.0000 ug | Freq: Once | INTRAMUSCULAR | Status: DC

## 2016-07-28 MED ORDER — LIDOCAINE HCL 1 % IJ SOLN
INTRAMUSCULAR | Status: DC | PRN
  Administered 2016-07-28: 8 mL
  Administered 2016-07-28: 6 mL

## 2016-07-28 MED ORDER — CEFAZOLIN SODIUM-DEXTROSE 2-4 GM/100ML-% IV SOLN
INTRAVENOUS | Status: AC
  Filled 2016-07-28: qty 100

## 2016-07-28 MED ORDER — HEPARIN SODIUM (PORCINE) 1000 UNIT/ML IJ SOLN
INTRAMUSCULAR | Status: AC
  Filled 2016-07-28: qty 1

## 2016-07-28 MED ORDER — SODIUM CHLORIDE 0.9 % IV SOLN
38.0000 ug | Freq: Once | INTRAVENOUS | Status: AC
  Administered 2016-07-28: 38 ug via INTRAVENOUS
  Filled 2016-07-28: qty 9.5

## 2016-07-28 MED ORDER — LIDOCAINE HCL (PF) 1 % IJ SOLN
INTRAMUSCULAR | Status: AC
  Filled 2016-07-28: qty 10

## 2016-07-28 NOTE — Procedures (Signed)
ESRD, poor functioning HD CATH  S/p LT IJ HD CATH EXCHG  No comp Stable Tip svcra Ready for use Full report in PACS

## 2016-07-28 NOTE — H&P (Signed)
Chief Complaint: ESRD, poor functioning HD cath Here for exchg  Referring Physician(s): Befekadu,Belayenh   Patient Status: [facility] - Out-pt  History of Present Illness: Mark [name] is a 61 y.o. male with ESRD and a chronic LT IJ utnneled HD cath.  Recently, Poor flow rates at HD.  He has a maturing left wrist AVF which hasnt been used yet.  This AVF has a few more weeks to mature and attempt use.    Past Medical History:  Diagnosis Date  . Anxiety    occas. panic attack, takes xanax occas  . Arthritis    herniated disc, lumbar   . Back pain   . CAD (coronary artery disease)    03/25/2015 95% prox to mid LAD treated with Promus Premier DES 3.5x74mm postdilated to 4.31mm, 100% prox to distal RCA lesion filled in the PDA region via collaterals from LAD and PLA  . Cancer ([place])    - skin ca on face- removed   . CHF (congestive heart failure) ([place])   . Chronic kidney disease   . Complication of anesthesia    " it takes a long time to get over it."   . Diabetes mellitus without complication ([place])    type 2  . Fibromyalgia   . GERD (gastroesophageal reflux disease)    otc- pepcid , approx. every other  day    . H/O acute respiratory failure   . Hematuria    being followed by Dr. Hinda Lenis for decreased kidney function   . History of blood product transfusion 02/13/2014   After having back surgery  . Hypercholesteremia   . Hypertension   . Myocardial infarction 03/2015  . Pneumonia    "walking"  . Shortness of breath dyspnea   . Sleep apnea    test aborted, due to not able to relax , since the aborted test he had surgery for gallbladder & he reports that he was told that he has sleep apnea     Past Surgical History:  Procedure Laterality Date  . AV FISTULA PLACEMENT Left 02/02/2015   Procedure: LEFT ARM RADIOCEPHALIC ARTERIOVENOUS (AV) FISTULA CREATION;  Surgeon: Conrad Middleport, MD;  Location: [place];  Service: Vascular;  Laterality: Left;  . AV FISTULA PLACEMENT Left  05/25/2016   Procedure: ARTERIOVENOUS (AV) FISTULA CREATION;  Surgeon: [name], MD;  Location: [facility];  Service: Vascular;  Laterality: Left;  . BACK SURGERY  02/13/14  . CARDIAC CATHETERIZATION N/A 03/25/2015   Procedure: Left Heart Cath and Coronary Angiography;  Surgeon: Leonie Man, MD;  Location: [place] [facility];  Service: Cardiovascular;  Laterality: N/A;  . CARDIAC CATHETERIZATION N/A 03/25/2015   Procedure: Coronary Stent Intervention;  Surgeon: Leonie Man, MD;  Location: [place] [facility];  Service: Cardiovascular;  Laterality: N/A;  . CHOLECYSTECTOMY    . COLONOSCOPY    . EYE SURGERY     cataracts remove, bilateral, w/IOL.  Marland Kitchen INSERTION OF DIALYSIS CATHETER N/A 03/09/2015   Procedure: INSERTION OF DIALYSIS CATHETER;  Surgeon: Conrad Meridianville, MD;  Location: [place];  Service: Vascular;  Laterality: N/A;  . INSERTION OF DIALYSIS CATHETER Left 04/07/2016   Procedure: INSERTION OF TUNNELED DIALYSIS CATHETER LEFT INTERNAL JUGULAR, ATTEMPTED INSERTION OF TUNNELLED CATHETER RIGHT INTERNAL JUGULAR;  Surgeon: [name], MD;  Location: [facility];  Service: Vascular;  Laterality: Left;  . LUMBAR LAMINECTOMY/DECOMPRESSION MICRODISCECTOMY Right 02/13/2014   Procedure: RIGHT LUMBAR FOUR-FIVE, RIGHT LUMBAR FIVE- SACRAL ONE LUMBAR LAMINECTOMY/DECOMPRESSION MICRODISCECTOMY ;  Surgeon: Ashok Pall, MD;  Location: [place] [facility];  Service: Neurosurgery;  Laterality: Right;  Right L4-5 and Right L5-S1 diskectomies  . TEE WITHOUT CARDIOVERSION N/A 05/02/2016   Procedure: TRANSESOPHAGEAL ECHOCARDIOGRAM (TEE);  Surgeon: Herminio Commons, MD;  [facility];  Service: Cardiovascular;  Laterality: N/A;  . TONSILLECTOMY      Allergies: Patient has no known allergies.  Medications: Prior to Admission medications   Medication Sig Start Date End Date Taking? Authorizing Provider  acetaminophen (TYLENOL) 500 MG tablet Take 500 mg by mouth every 6 (six) hours as needed for mild  pain or moderate pain.    Historical Provider, MD  ALPRAZolam Duanne Moron) 1 MG tablet Take 1 mg by mouth 2 (two) times daily as needed for anxiety.  12/21/14   Historical Provider, MD  aspirin EC 81 MG tablet Take 81 mg by mouth every evening.     Historical Provider, MD  atorvastatin (LIPITOR) 40 MG tablet Take 40 mg by mouth at bedtime.    Historical Provider, MD  B Complex-C-Folic Acid (RENA-VITE RX) 1 MG TABS Take 1 tablet by mouth every evening.  04/25/16   Historical Provider, MD  BRILINTA 90 MG TABS tablet TAKE ONE TABLET BY MOUTH TWICE DAILY. 07/17/16   Herminio Commons, MD  calcium carbonate (TUMS - DOSED IN MG ELEMENTAL CALCIUM) 500 MG chewable tablet Chew 2-3 tablets by mouth 3 (three) times daily with meals.     Historical Provider, MD  furosemide (LASIX) 40 MG tablet Take 40 mg by mouth See admin instructions. 40 mg twice daily  on Tuesdays, Thursdays, Saturdays, and Sundays **DO NOT TAKE LASIX ON DIALYSIS DAYS*    Historical Provider, MD  glipiZIDE (GLUCOTROL XL) 2.5 MG 24 hr tablet Take 2.5 mg by mouth daily. 03/23/16   Historical Provider, MD  hydrALAZINE (APRESOLINE) 50 MG tablet Take 1.5 tablets (75 mg total) by mouth 3 (three) times daily. Takes 1.5 tablets 05/08/16   Herminio Commons, MD  HYDROcodone-acetaminophen (NORCO) 10-325 MG per tablet Take 1 tablet by mouth every 6 (six) hours as needed for severe pain.  02/10/15   Historical Provider, MD  loratadine (CLARITIN) 10 MG tablet Take 10 mg by mouth daily as needed for allergies.    Historical Provider, MD  nitroGLYCERIN (NITRODUR - DOSED IN MG/24 HR) 0.2 mg/hr patch Place 0.2 mg onto the skin every Tuesday, Thursday, Saturday, and Sunday. Apply 1 patch in the morning and remove at night.    Historical Provider, MD  nitroGLYCERIN (NITROSTAT) 0.4 MG SL tablet Place 1 tablet (0.4 mg total) under the tongue every 5 (five) minutes as needed. Patient taking differently: Place 0.4 mg under the tongue every 5 (five) minutes as needed for  chest pain.  03/23/15   Herminio Commons, MD  ranitidine (ZANTAC) 150 MG tablet Take 150 mg by mouth daily as needed for heartburn.    Historical Provider, MD  sodium bicarbonate 650 MG tablet Take 650 mg by mouth 3 (three) times daily.  01/09/15   Historical Provider, MD  traZODone (DESYREL) 50 MG tablet Take 50 mg by mouth at bedtime as needed for sleep.     Historical Provider, MD     Family History  Problem Relation Age of Onset  . Adopted: Yes  . Prostate cancer Father   . Cancer Father     prostate  . Heart failure Maternal Grandmother   . Heart attack Maternal Grandfather   . Colon cancer Maternal Grandfather  Social History   Social History  . Marital status: Married    Spouse name: N/A  . Number of children: 2  . Years of education: HS   Social History Main Topics  . Smoking status: Former Smoker    Packs/day: 0.50    Years: 40.00    Types: Cigarettes    Start date: 10/17/1973    Quit date: 02/07/2015  . Smokeless tobacco: Never Used  . Alcohol use No  . Drug use: No  . Sexual activity: Not on file   Other Topics Concern  . Not on file   Social History Narrative   Drinks 3 Diet sodas a day       Review of Systems: A 12 point ROS discussed and pertinent positives are indicated in the HPI above.  All other systems are negative.  Review of Systems  Vital Signs: There were no vitals taken for this visit.  Physical Exam  Constitutional: He appears well-developed and well-nourished. No distress.  Eyes: Conjunctivae are normal. No scleral icterus.  Cardiovascular: Normal rate and regular rhythm.   No murmur heard. Pulmonary/Chest: Effort normal and breath sounds normal. No respiratory distress.  Abdominal: Soft. Bowel sounds are normal. He exhibits no distension.  Skin: He is not diaphoretic.    Mallampati Score:   3  Imaging: No results found.  Labs:  CBC:  Recent Labs  04/03/16 1619 04/04/16 1208 04/06/16 1946 04/07/16 0721  05/25/16 0646  WBC 6.4 5.9 7.6  --  7.6  HGB 9.5* 9.4* 9.7* 9.5* 12.2*  HCT 29.7* 30.1* 30.3* 28.0* 38.2*  PLT 175 161 167  --  147*    COAGS: No results for input(s): INR, APTT in the last 8760 hours.  BMP:  Recent Labs  04/03/16 1619 04/04/16 1208 04/06/16 1946 04/07/16 0721 05/25/16 0646  NA 135 137 137 139 138  K 3.9 4.3 5.6* 4.1 4.6  CL 100* 104 106 101 102  CO2 27 25 22   --  26  GLUCOSE 161* 150* 91 157* 115*  BUN 22* 33* 56* 28* 34*  CALCIUM 8.3* 8.5* 9.1  --  9.4  CREATININE 3.80* 5.38* 7.33* 4.50* 5.31*  GFRNONAA 16* 10* 7*  --  11*  GFRAA 18* 12* 8*  --  12*    LIVER FUNCTION TESTS:  Recent Labs  03/31/16 1920  BILITOT 0.7  AST 27  ALT 25  ALKPHOS 57  PROT 6.5  ALBUMIN 3.5    TUMOR MARKERS: No results for input(s): AFPTM, CEA, CA199, CHROMGRNA in the last 8760 hours.  Assessment and Plan:  Plan for LT IJ HD CATH EXCHG TODAY.  Risks and Benefits discussed with the patient including, but not limited to bleeding, infection, vascular injury, pneumothorax which may require chest tube placement, air embolism or even death All of the patient's questions were answered, patient is agreeable to proceed. Consent signed and in chart.    Thank you for this interesting consult.  I greatly enjoyed meeting Herve Haug and look forward to participating in their care.  A copy of this report was sent to the requesting provider on this date.  Electronically Signed: Greggory Keen 07/28/2016, 9:07 AM   I spent a total of  30 Minutes   in face to face in clinical consultation, greater than 50% of which was counseling/coordinating care for this HD patient.

## 2016-09-26 ENCOUNTER — Other Ambulatory Visit: Payer: Self-pay | Admitting: *Deleted

## 2016-09-26 ENCOUNTER — Encounter: Payer: Self-pay | Admitting: Vascular Surgery

## 2016-09-26 DIAGNOSIS — T82510D Breakdown (mechanical) of surgically created arteriovenous fistula, subsequent encounter: Secondary | ICD-10-CM

## 2016-09-27 ENCOUNTER — Ambulatory Visit (INDEPENDENT_AMBULATORY_CARE_PROVIDER_SITE_OTHER): Payer: Managed Care, Other (non HMO) | Admitting: Vascular Surgery

## 2016-09-27 ENCOUNTER — Encounter: Payer: Self-pay | Admitting: Vascular Surgery

## 2016-09-27 ENCOUNTER — Ambulatory Visit (HOSPITAL_COMMUNITY)
Admission: RE | Admit: 2016-09-27 | Discharge: 2016-09-27 | Disposition: A | Discharge status: Home / Self Care | Payer: Managed Care, Other (non HMO) | Source: Ambulatory Visit | Attending: Vascular Surgery | Admitting: Vascular Surgery

## 2016-09-27 ENCOUNTER — Other Ambulatory Visit: Payer: Self-pay

## 2016-09-27 VITALS — BP 151/68 | HR 67 | Temp 97.1°F | Resp 20 | Ht 72.0 in | Wt 285.6 lb

## 2016-09-27 DIAGNOSIS — T82510D Breakdown (mechanical) of surgically created arteriovenous fistula, subsequent encounter: Secondary | ICD-10-CM | POA: Diagnosis not present

## 2016-09-27 DIAGNOSIS — Z992 Dependence on renal dialysis: Secondary | ICD-10-CM

## 2016-09-27 DIAGNOSIS — N186 End stage renal disease: Secondary | ICD-10-CM

## 2016-09-27 NOTE — Progress Notes (Signed)
Patient name: Mark [name] MRN: [id number] DOB: 10-30-1955 Sex: male  REASON FOR CONSULT: Fistula is difficult to cannulate. Referred by Dr. Lowanda Foster  HPI: Mark [name] is a 61 y.o. male, who has a left forearm fistula which has been difficult to access. He states that he originally had a radiocephalic fistula in July 3500 here in [place]. He had a left radiocephalic fistula by Dr. Adele Barthel on 02/02/2015. It was noted that the radial artery was small. The cephalic vein was 3 mm. This did not work and ultimately in December 2017 he had a new forearm fistula placed elsewhere. This has been difficult to access.  He dialyzes on Monday Wednesdays and Fridays in [place].   He denies pain or paresthesias in his left arm.  Past Medical History:  Diagnosis Date  . Anxiety    occas. panic attack, takes xanax occas  . Arthritis    herniated disc, lumbar   . Back pain   . CAD (coronary artery disease)    03/25/2015 95% prox to mid LAD treated with Promus Premier DES 3.5x3mm postdilated to 4.66mm, 100% prox to distal RCA lesion filled in the PDA region via collaterals from LAD and PLA  . Cancer ([place])    - skin ca on face- removed   . CHF (congestive heart failure) ([place])   . Chronic kidney disease   . Complication of anesthesia    " it takes a long time to get over it."   . Diabetes mellitus without complication ([place])    type 2  . Fibromyalgia   . GERD (gastroesophageal reflux disease)    otc- pepcid , approx. every other  day    . H/O acute respiratory failure   . Hematuria    being followed by Dr. Hinda Lenis for decreased kidney function   . History of blood product transfusion 02/13/2014   After having back surgery  . Hypercholesteremia   . Hypertension   . Myocardial infarction ([place]) 03/2015  . Pneumonia    "walking"  . Shortness of breath dyspnea   . Sleep apnea    test aborted, due to not able to relax , since the aborted test he had surgery for gallbladder & he reports that  he was told that he has sleep apnea     Family History  Problem Relation Age of Onset  . Adopted: Yes  . Prostate cancer Father   . Cancer Father     prostate  . Heart failure Maternal Grandmother   . Heart attack Maternal Grandfather   . Colon cancer Maternal Grandfather     SOCIAL HISTORY: Social History   Social History  . Marital status: Married    Spouse name: N/A  . Number of children: 2  . Years of education: HS   Occupational History  . Not on file.   Social History Main Topics  . Smoking status: Former Smoker    Packs/day: 0.50    Years: 40.00    Types: Cigarettes    Start date: 10/17/1973    Quit date: 02/07/2015  . Smokeless tobacco: Never Used  . Alcohol use No  . Drug use: No  . Sexual activity: Not on file   Other Topics Concern  . Not on file   Social History Narrative   Drinks 3 Diet sodas a day     No Known Allergies  Current Outpatient Prescriptions  Medication Sig Dispense Refill  . acetaminophen (TYLENOL) 500 MG tablet Take 500 mg by mouth  every 6 (six) hours as needed for mild pain or moderate pain.    Marland Kitchen ALPRAZolam (XANAX) 1 MG tablet Take 1 mg by mouth 2 (two) times daily as needed for anxiety.     Marland Kitchen aspirin EC 81 MG tablet Take 81 mg by mouth every evening.     Marland Kitchen atorvastatin (LIPITOR) 40 MG tablet Take 40 mg by mouth at bedtime.    Marland Kitchen BRILINTA 90 MG TABS tablet TAKE ONE TABLET BY MOUTH TWICE DAILY. 60 tablet 6  . calcium carbonate (TUMS - DOSED IN MG ELEMENTAL CALCIUM) 500 MG chewable tablet Chew 2-3 tablets by mouth 3 (three) times daily with meals.     . furosemide (LASIX) 40 MG tablet Take 40 mg by mouth See admin instructions. 40 mg twice daily  on Tuesdays, Thursdays, Saturdays, and Sundays **DO NOT TAKE LASIX ON DIALYSIS DAYS*    . glipiZIDE (GLUCOTROL XL) 2.5 MG 24 hr tablet Take 2.5 mg by mouth daily.  3  . hydrALAZINE (APRESOLINE) 50 MG tablet Take 1.5 tablets (75 mg total) by mouth 3 (three) times daily. Takes 1.5 tablets 135  tablet 6  . HYDROcodone-acetaminophen (NORCO) 10-325 MG per tablet Take 1 tablet by mouth every 6 (six) hours as needed for severe pain.   0  . morphine (MSIR) 30 MG tablet Take by mouth.    . nitroGLYCERIN (NITRODUR - DOSED IN MG/24 HR) 0.2 mg/hr patch Place 0.2 mg onto the skin every Tuesday, Thursday, Saturday, and Sunday. Apply 1 patch in the morning and remove at night.    . nitroGLYCERIN (NITROSTAT) 0.4 MG SL tablet Place 1 tablet (0.4 mg total) under the tongue every 5 (five) minutes as needed. (Patient taking differently: Place 0.4 mg under the tongue every 5 (five) minutes as needed for chest pain. ) 25 tablet 3  . PARoxetine (PAXIL) 10 MG tablet Take 10 mg by mouth daily.    . ranitidine (ZANTAC) 150 MG tablet Take 150 mg by mouth daily as needed for heartburn.    . sodium bicarbonate 650 MG tablet Take 650 mg by mouth 3 (three) times daily.   4  . traZODone (DESYREL) 50 MG tablet Take 50 mg by mouth at bedtime as needed for sleep.     . B Complex-C-Folic Acid (RENA-VITE RX) 1 MG TABS Take 1 tablet by mouth every evening.   99  . loratadine (CLARITIN) 10 MG tablet Take 10 mg by mouth daily as needed for allergies.     No current facility-administered medications for this visit.    Facility-Administered Medications Ordered in Other Visits  Medication Dose Route Frequency Provider Last Rate Last Dose  . 0.9 %  sodium chloride infusion   Intravenous Continuous Monia Sabal, PA-C      . ceFAZolin (ANCEF) 2 g in dextrose 5 % 100 mL IVPB  2 g Intravenous Once Monia Sabal, PA-C        REVIEW OF SYSTEMS:  [X]  denotes positive finding, [ ]  denotes negative finding Cardiac  Comments:  Chest pain or chest pressure:    Shortness of breath upon exertion:    Short of breath when lying flat:    Irregular heart rhythm:        Vascular    Pain in calf, thigh, or hip brought on by ambulation:    Pain in feet at night that wakes you up from your sleep:     Blood clot in your veins:    Leg  swelling:  Pulmonary    Oxygen at home:    Productive cough:     Wheezing:         Neurologic    Sudden weakness in arms or legs:     Sudden numbness in arms or legs:     Sudden onset of difficulty speaking or slurred speech:    Temporary loss of vision in one eye:     Problems with dizziness:         Gastrointestinal    Blood in stool:     Vomited blood:         Genitourinary    Burning when urinating:     Blood in urine:        Psychiatric    Major depression:         Hematologic    Bleeding problems:    Problems with blood clotting too easily:        Skin    Rashes or ulcers:        Constitutional    Fever or chills:      PHYSICAL EXAM: Vitals:   09/27/16 1030 09/27/16 1031  BP: (!) 154/77 (!) 151/68  Pulse: 67   Resp: 20   Temp: 97.1 F (36.2 C)   TempSrc: Oral   SpO2: 93%   Weight: 285 lb 9.6 oz (129.5 kg)   Height: 6' (1.829 m)     GENERAL: The patient is a well-nourished male, in no acute distress. The vital signs are documented above. CARDIAC: There is a regular rate and rhythm.  VASCULAR: He has a good thrill in his left forearm fistula. Has a palpable left radial pulse. PULMONARY: There is good air exchange bilaterally without wheezing or rales. ABDOMEN: Soft and non-tender with normal pitched bowel sounds.  MUSCULOSKELETAL: There are no major deformities or cyanosis. NEUROLOGIC: No focal weakness or paresthesias are detected. SKIN: There are no ulcers or rashes noted. PSYCHIATRIC: The patient has a normal affect.  DATA:   DUPLEX OF LEFT FOREARM AV FISTULA: I have independent only interpreted the duplex of his left forearm fistula. The diameters range from 0.35-0.92 cm. There are really no areas of significantly increased velocity. There is one small competing branch noted.  MEDICAL ISSUES:  DIFFICULT ACCESS LEFT FOREARM FISTULA: Based on the duplex, I really do not see any issues with the fistula. For this reason I have recommended  a fistulogram to further evaluate this. He dialyzes on Monday Wednesdays and Fridays in [place], [place], and therefore we'll schedule this on a Tuesday or Thursday. I've explained that if he does have disease amenable to venoplasty this could potentially be done at the same time. Otherwise, we'll have the information we need to determine if there is any way to improve the performance of the fistula. Otherwise he would have to be considered for new access.   Deitra Mayo [facility] [phone]

## 2016-09-28 ENCOUNTER — Other Ambulatory Visit: Payer: Self-pay | Admitting: Cardiovascular Disease

## 2016-10-10 ENCOUNTER — Ambulatory Visit (HOSPITAL_COMMUNITY): Admission: RE | Admit: 2016-10-10 | Payer: Managed Care, Other (non HMO) | Source: Ambulatory Visit | Admitting: Surgery

## 2016-10-10 ENCOUNTER — Encounter (HOSPITAL_COMMUNITY): Admission: RE | Payer: Self-pay | Source: Ambulatory Visit

## 2016-10-10 SURGERY — A/V FISTULAGRAM
Anesthesia: LOCAL | Laterality: Left

## 2016-10-12 ENCOUNTER — Telehealth: Payer: Self-pay | Admitting: Family Medicine

## 2016-10-12 NOTE — Telephone Encounter (Signed)
Error. Disregard

## 2016-10-31 ENCOUNTER — Encounter (HOSPITAL_COMMUNITY): Payer: Self-pay | Admitting: Surgery

## 2016-10-31 ENCOUNTER — Encounter (HOSPITAL_COMMUNITY)
Admission: RE | Disposition: A | Discharge status: Home / Self Care | Payer: Self-pay | Source: Ambulatory Visit | Attending: Surgery

## 2016-10-31 ENCOUNTER — Other Ambulatory Visit: Payer: Self-pay | Admitting: Cardiovascular Disease

## 2016-10-31 ENCOUNTER — Encounter: Payer: Self-pay | Admitting: *Deleted

## 2016-10-31 ENCOUNTER — Ambulatory Visit (HOSPITAL_COMMUNITY)
Admission: RE | Admit: 2016-10-31 | Discharge: 2016-10-31 | Disposition: A | Discharge status: Home / Self Care | Payer: Managed Care, Other (non HMO) | Source: Ambulatory Visit | Attending: Surgery | Admitting: Surgery

## 2016-10-31 DIAGNOSIS — T82858A Stenosis of vascular prosthetic devices, implants and grafts, initial encounter: Secondary | ICD-10-CM | POA: Diagnosis present

## 2016-10-31 DIAGNOSIS — T82898A Other specified complication of vascular prosthetic devices, implants and grafts, initial encounter: Secondary | ICD-10-CM | POA: Diagnosis not present

## 2016-10-31 DIAGNOSIS — Y832 Surgical operation with anastomosis, bypass or graft as the cause of abnormal reaction of the patient, or of later complication, without mention of misadventure at the time of the procedure: Secondary | ICD-10-CM | POA: Insufficient documentation

## 2016-10-31 HISTORY — PX: A/V FISTULAGRAM: CATH118298

## 2016-10-31 HISTORY — PX: PERIPHERAL VASCULAR BALLOON ANGIOPLASTY: CATH118281

## 2016-10-31 LAB — POCT I-STAT, CHEM 8
BUN: 42 mg/dL — AB (ref 6–20)
CALCIUM ION: 1.18 mmol/L (ref 1.15–1.40)
CHLORIDE: 96 mmol/L — AB (ref 101–111)
CREATININE: 4.3 mg/dL — AB (ref 0.61–1.24)
Glucose, Bld: 70 mg/dL (ref 65–99)
HEMATOCRIT: 34 % — AB (ref 39.0–52.0)
Hemoglobin: 11.6 g/dL — ABNORMAL LOW (ref 13.0–17.0)
Potassium: 3.9 mmol/L (ref 3.5–5.1)
SODIUM: 137 mmol/L (ref 135–145)
TCO2: 30 mmol/L (ref 0–100)

## 2016-10-31 LAB — GLUCOSE, CAPILLARY: Glucose-Capillary: 67 mg/dL (ref 65–99)

## 2016-10-31 SURGERY — A/V FISTULAGRAM
Anesthesia: LOCAL | Laterality: Left

## 2016-10-31 MED ORDER — IODIXANOL 320 MG/ML IV SOLN
INTRAVENOUS | Status: DC | PRN
  Administered 2016-10-31: 45 mL via INTRA_ARTERIAL

## 2016-10-31 MED ORDER — OXYCODONE HCL 5 MG PO TABS: 5.0000 mg | ORAL_TABLET | ORAL | Status: DC | PRN

## 2016-10-31 MED ORDER — LABETALOL HCL 5 MG/ML IV SOLN: 10.0000 mg | INTRAVENOUS | Status: DC | PRN

## 2016-10-31 MED ORDER — HYDRALAZINE HCL 20 MG/ML IJ SOLN: 5.0000 mg | INTRAMUSCULAR | Status: DC | PRN

## 2016-10-31 MED ORDER — SODIUM CHLORIDE 0.9% FLUSH: 3.0000 mL | INTRAVENOUS | Status: DC | PRN

## 2016-10-31 MED ORDER — SODIUM CHLORIDE 0.9 % IV SOLN: 250.0000 mL | INTRAVENOUS | Status: DC | PRN

## 2016-10-31 MED ORDER — HEPARIN (PORCINE) IN NACL 2-0.9 UNIT/ML-% IJ SOLN
INTRAMUSCULAR | Status: AC
  Filled 2016-10-31: qty 500

## 2016-10-31 MED ORDER — FENTANYL CITRATE (PF) 100 MCG/2ML IJ SOLN
INTRAMUSCULAR | Status: AC
  Filled 2016-10-31: qty 2

## 2016-10-31 MED ORDER — SODIUM CHLORIDE 0.9% FLUSH: 3.0000 mL | Freq: Two times a day (BID) | INTRAVENOUS | Status: DC

## 2016-10-31 MED ORDER — LIDOCAINE HCL 1 % IJ SOLN
INTRAMUSCULAR | Status: AC
  Filled 2016-10-31: qty 20

## 2016-10-31 MED ORDER — FENTANYL CITRATE (PF) 100 MCG/2ML IJ SOLN
INTRAMUSCULAR | Status: DC | PRN
  Administered 2016-10-31: 50 ug via INTRAVENOUS

## 2016-10-31 SURGICAL SUPPLY — 15 items
BALLN MUSTANG 6X120X135 (BALLOONS) ×2
BALLOON MUSTANG 6X120X135 (BALLOONS) IMPLANT
COVER DOME SNAP 22 D (MISCELLANEOUS) ×2 IMPLANT
COVER PRB 48X5XTLSCP FOLD TPE (BAG) ×1 IMPLANT
COVER PROBE 5X48 (BAG) ×2
KIT ENCORE 26 ADVANTAGE (KITS) ×1 IMPLANT
KIT MICROINTRODUCER STIFF 5F (SHEATH) ×1 IMPLANT
PROTECTION STATION PRESSURIZED (MISCELLANEOUS) ×2
SHEATH PINNACLE R/O II 5F 6CM (SHEATH) ×1 IMPLANT
STATION PROTECTION PRESSURIZED (MISCELLANEOUS) ×1 IMPLANT
STOPCOCK MORSE 400PSI 3WAY (MISCELLANEOUS) ×2 IMPLANT
TRAY PV CATH (CUSTOM PROCEDURE TRAY) ×2 IMPLANT
TUBING CIL FLEX 10 FLL-RA (TUBING) ×2 IMPLANT
WIRE BENTSON .035X145CM (WIRE) ×1 IMPLANT
WIRE HI TORQ VERSACORE J 260CM (WIRE) ×1 IMPLANT

## 2016-10-31 NOTE — Progress Notes (Signed)
  Mark [name], Mark [name] Male, 61 y.o., 1956-04-09 Weight:  282 lb (127.9 kg) Phone:  [phone] PCP:  Mark Lair., MD FYI General HIPAA Restrictions MRN:  [id number] MyChart:  Active Next Appt:  None   Message  Received: Today  Message Contents  Serafina Mitchell, MD sent to [facility] Charge Pool        10/31/2016:   Surgeon: Mark [name]  Procedure Performed:  1. Ultrasound-guided access, left cephalic vein  2. Balloon assisted maturation (angioplasty, left cephalic vein)    If the fistula cannot be cannulated, he can see Dr. Scot [name] again for discussions of the new fistula

## 2016-10-31 NOTE — H&P (Addendum)
   Patient name: Mark [name] MRN: [id number] DOB: 1956/04/27 Sex: male  REASON FOR VISIT:     poorly functioning L UE AVF  HISTORY OF PRESENT ILLNESS:   Mark [name] is a 61 y.o. male with a poorly functioning AVF.  Dr. Scot Dock recommended fistulogram  CURRENT MEDICATIONS:    Current Facility-Administered Medications  Medication Dose Route Frequency Provider Last Rate Last Dose  . 0.9 %  sodium chloride infusion  250 mL Intravenous PRN Serafina Mitchell, MD      . sodium chloride flush (NS) 0.9 % injection 3 mL  3 mL Intravenous Q12H Serafina Mitchell, MD      . sodium chloride flush (NS) 0.9 % injection 3 mL  3 mL Intravenous PRN Serafina Mitchell, MD       Facility-Administered Medications Ordered in Other Encounters  Medication Dose Route Frequency Provider Last Rate Last Dose  . 0.9 %  sodium chloride infusion   Intravenous Continuous Monia Sabal, PA-C      . ceFAZolin (ANCEF) 2 g in dextrose 5 % 100 mL IVPB  2 g Intravenous Once Monia Sabal, PA-C        REVIEW OF SYSTEMS:   [X]  denotes positive finding, [ ]  denotes negative finding Cardiac  Comments:  Chest pain or chest pressure:     Shortness of breath upon exertion:    Short of breath when lying flat:    Irregular heart rhythm:    Constitutional    Fever or chills:      PHYSICAL EXAM:   Vitals:   10/31/16 0545  BP: (!) 160/64  Pulse: 78  Resp: 18  Temp: 98.3 F (36.8 C)  TempSrc: Oral  SpO2: 94%  Weight: 282 lb (127.9 kg)  Height: 6' (1.829 m)    GENERAL: The patient is a well-nourished male, in no acute distress. The vital signs are documented above. CARDIOVASCULAR: There is a regular rate and rhythm. PULMONARY: Non-labored respirations I can not palpate a thrill IN THE avf.  By u/s it appears patent  STUDIES:   I used u/s to evaluate the AVF.  It appeared patent but had a poor doppler signal   MEDICAL ISSUES:   Will evaluate the AVF in the [facility] lab.    Will proceed with intervention as indicated  Annamarie Major, MD [facility] [phone] Pager [phone]

## 2016-10-31 NOTE — Op Note (Signed)
    Patient name: Mark [name] MRN: [id number] DOB: 01-Aug-1955 Sex: male  10/31/2016 Pre-operative Diagnosis: Non-functioning left brachiocephalic fistula Post-operative diagnosis:  Same Surgeon:  Annamarie Major Procedure Performed:  1.  Ultrasound-guided access, left cephalic vein  2.  Balloon assisted maturation (angioplasty, left cephalic vein)    Indications:  The patient has a nonfunctioning left brachiocephalic fistula.  He is here today for further evaluation  Procedure:  The patient was identified in the holding area and taken to room 8.  The patient was then placed supine on the table and prepped and draped in the usual sterile fashion.  A time out was called.  Ultrasound was used to evaluate the fistula.  The vein was patent and compressible.  A digital ultrasound image was acquired.  The fistula was then accessed under ultrasound guidance using a micropuncture needle.  An 018 wire was then asvanced without resistance and a micropuncture sheath was placed.  Contrast injections were then performed through the sheath.  Findings:  No central venous stenosis.  A small cephalic vein is noted throughout the left upper arm.  The arterial venous anastomosis is widely patent   Intervention:  After the above images were acquired, I felt that the vein was likely too small for utilization but considered balloon assisted maturation.  Over an 035 wire, a 5 French sheath was inserted.  I selected a 6 x 1 20 Mustang balloon and performed balloon angioplasty of the entire cephalic vein taking the balloon to 10 atm which was nominal pressure and holding up for 1 minute with each inflation.  After this was completed a completion angiogram was performed.  This showed significant improvement in the caliber of the cephalic vein.  The wire was then removed and the cannulation site was closed with suture.  Impression:  #1  angioplasty left cephalic vein using a 6 mm balloon.  The entire length of the vein  was dilated.  #2  vein is very small in caliber.  An attempt at balloon assisted maturation was performed.  If the vein is unable to be cannulated after this attempt, the patient will need new access   V. Annamarie Major, M.D. [facility] Office: [phone] Pager:  [phone]

## 2016-10-31 NOTE — Discharge Instructions (Signed)
Fistulogram, Care After °Refer to this sheet in the next few weeks. These instructions provide you with information on caring for yourself after your procedure. Your health care provider may also give you more specific instructions. Your treatment has been planned according to current medical practices, but problems sometimes occur. Call your health care provider if you have any problems or questions after your procedure. °What can I expect after the procedure? °After your procedure, it is typical to have the following: °· A small amount of discomfort in the area where the catheters were placed. °· A small amount of bruising around the fistula. °· Sleepiness and fatigue. °Follow these instructions at home: °· Rest at home for the day following your procedure. °· Do not drive or operate heavy machinery while taking pain medicine. °· Take medicines only as directed by your health care provider. °· Do not take baths, swim, or use a hot tub until your health care provider approves. You may shower 24 hours after the procedure or as directed by your health care provider. °· There are many different ways to close and cover an incision, including stitches, skin glue, and adhesive strips. Follow your health care provider's instructions on: °¨ Incision care. °¨ Bandage (dressing) changes and removal. °¨ Incision closure removal. °· Monitor your dialysis fistula carefully. °Contact a health care provider if: °· You have drainage, redness, swelling, or pain at your catheter site. °· You have a fever. °· You have chills. °Get help right away if: °· You feel weak. °· You have trouble balancing. °· You have trouble moving your arms or legs. °· You have problems with your speech or vision. °· You can no longer feel a vibration or buzz when you put your fingers over your dialysis fistula. °· The limb that was used for the procedure: °¨ Swells. °¨ Is painful. °¨ Is cold. °¨ Is discolored, such as blue or pale white. °This information  is not intended to replace advice given to you by your health care provider. Make sure you discuss any questions you have with your health care provider. °Document Released: 10/06/2013 Document Revised: 10/28/2015 Document Reviewed: 07/11/2013 °Elsevier Interactive Patient Education © 2017 [employer] Inc[employer] ° °

## 2016-11-01 MED FILL — Lidocaine HCl Local Inj 1%: INTRAMUSCULAR | Qty: 20 | Status: AC

## 2016-11-01 MED FILL — Heparin Sodium (Porcine) 2 Unit/ML in Sodium Chloride 0.9%: INTRAMUSCULAR | Qty: 500 | Status: AC

## 2016-11-02 ENCOUNTER — Ambulatory Visit (INDEPENDENT_AMBULATORY_CARE_PROVIDER_SITE_OTHER): Payer: Managed Care, Other (non HMO) | Admitting: Otolaryngology

## 2016-11-02 DIAGNOSIS — H6122 Impacted cerumen, left ear: Secondary | ICD-10-CM

## 2016-11-02 DIAGNOSIS — H9012 Conductive hearing loss, unilateral, left ear, with unrestricted hearing on the contralateral side: Secondary | ICD-10-CM

## 2016-11-13 ENCOUNTER — Telehealth (HOSPITAL_COMMUNITY): Payer: Self-pay | Admitting: Vascular Surgery

## 2016-11-13 NOTE — Telephone Encounter (Signed)
-----   Message from Mena Goes, RN sent at 11/13/2016  3:22 PM EDT ----- Regarding: RE: Vein mapping vs fistuolgram No, I copied CSD's note from [place]. He only needs to see CSD to discuss new access. No labs.  Thanks  ----- Message ----- From: Rufina Falco Sent: 11/13/2016   2:29 PM To: [facility] Clinical Pool Subject: Vein mapping vs fistuolgram                    Would a a vein mapping be needed if a fistulogram was performed the end of May?  The appointment is for eval for new avf/avg.  Rip Harbour

## 2016-12-04 ENCOUNTER — Encounter: Payer: Self-pay | Admitting: Vascular Surgery

## 2016-12-07 ENCOUNTER — Other Ambulatory Visit: Payer: Self-pay | Admitting: Cardiovascular Disease

## 2016-12-12 ENCOUNTER — Encounter (HOSPITAL_COMMUNITY): Payer: Managed Care, Other (non HMO)

## 2016-12-13 ENCOUNTER — Ambulatory Visit: Payer: Managed Care, Other (non HMO) | Admitting: Vascular Surgery

## 2017-01-09 ENCOUNTER — Ambulatory Visit: Payer: Managed Care, Other (non HMO) | Admitting: Cardiovascular Disease

## 2017-01-31 ENCOUNTER — Encounter: Payer: Self-pay | Admitting: Cardiovascular Disease

## 2017-01-31 ENCOUNTER — Ambulatory Visit (INDEPENDENT_AMBULATORY_CARE_PROVIDER_SITE_OTHER): Payer: Managed Care, Other (non HMO) | Admitting: Cardiovascular Disease

## 2017-01-31 VITALS — BP 154/76 | HR 81 | Ht 72.0 in | Wt 235.0 lb

## 2017-01-31 DIAGNOSIS — N186 End stage renal disease: Secondary | ICD-10-CM | POA: Diagnosis not present

## 2017-01-31 DIAGNOSIS — Z955 Presence of coronary angioplasty implant and graft: Secondary | ICD-10-CM | POA: Diagnosis not present

## 2017-01-31 DIAGNOSIS — I5022 Chronic systolic (congestive) heart failure: Secondary | ICD-10-CM

## 2017-01-31 DIAGNOSIS — Z992 Dependence on renal dialysis: Secondary | ICD-10-CM | POA: Diagnosis not present

## 2017-01-31 DIAGNOSIS — I10 Essential (primary) hypertension: Secondary | ICD-10-CM

## 2017-01-31 DIAGNOSIS — E7800 Pure hypercholesterolemia, unspecified: Secondary | ICD-10-CM | POA: Diagnosis not present

## 2017-01-31 DIAGNOSIS — I25118 Atherosclerotic heart disease of native coronary artery with other forms of angina pectoris: Secondary | ICD-10-CM

## 2017-01-31 DIAGNOSIS — I209 Angina pectoris, unspecified: Secondary | ICD-10-CM

## 2017-01-31 DIAGNOSIS — R001 Bradycardia, unspecified: Secondary | ICD-10-CM | POA: Diagnosis not present

## 2017-01-31 NOTE — Patient Instructions (Signed)
Your physician wants you to follow-up in: [name] will receive a reminder letter in the mail two months in advance. If you don't receive a letter, please call our office to schedule the follow-up appointment.  Your physician has recommended you make the following change in your medication:   DECREASE BRILINTA 60 MG TWICE DAILY - YOU MAY FINISH THE REST OF YOUR BRILINTA 90 MG  Thank you for choosing [facility]!!

## 2017-01-31 NOTE — Progress Notes (Signed)
SUBJECTIVE: The patient presents for routine follow-up. Coronary angiography on 03/25/15 demonstrated a 95% stenosis in the proximal to mid LAD for which a drug-eluting stent was placed. He also had 100% stenosis of the proximal to distal RCA lesion. The RCA filled in the PDA region via collaterals from the LAD and PL via the circumflex. Long-term Brilinta was recommended.  I performed a TEE on 05/02/16 which demonstrated moderately reduced left ventricular systolic function, LVEF 27-51%, wall motion abnormalities, mild to moderate aortic regurgitation.  He has had several medical issues since his last visit with me. 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  He also has lumbar disc herniation with radiculopathy with plans for a right L4-5 redo foraminotomy and right L5-S1 foraminotomy with possible microdiscectomy.  He underwent dialysis today. He denies chest pain and shortness of breath. Primary complaints relate to lower back pain with associated right leg pain/radiculopathy.  Blood pressure is elevated today, 154/76, but it was 141/59 yesterday. He said is normally well controlled.        Review of Systems: As per "subjective", otherwise negative.  No Known Allergies  Current Outpatient Prescriptions  Medication Sig Dispense Refill  . acetaminophen (TYLENOL) 500 MG tablet Take 500 mg by mouth daily as needed for mild pain or moderate pain.     Marland Kitchen ALPRAZolam (XANAX) 1 MG tablet Take 1 mg by mouth 2 (two) times daily.     Marland Kitchen aspirin EC 81 MG tablet Take 81 mg by mouth every evening.     Marland Kitchen atorvastatin (LIPITOR) 40 MG tablet Take 40 mg by mouth at bedtime.    . B Complex-C-Folic Acid (RENA-VITE RX) 1 MG TABS Take 1 mg by mouth daily.   99  . BRILINTA 90 MG TABS tablet TAKE ONE TABLET BY MOUTH TWICE DAILY. 60 tablet 6    . calcium carbonate (TUMS - DOSED IN MG ELEMENTAL CALCIUM) 500 MG chewable tablet Chew 1,000 mg by mouth 3 (three) times daily with meals.     . cetirizine (ZYRTEC) 10 MG tablet Take 10 mg by mouth daily as needed for allergies.    Marland Kitchen docusate sodium (COLACE) 100 MG capsule Take 200 mg by mouth daily.    . fentaNYL (DURAGESIC - DOSED MCG/HR) 25 MCG/HR patch Place onto the skin.    . fluticasone (FLONASE) 50 MCG/ACT nasal spray Place 2 sprays into both nostrils daily as needed for allergies or rhinitis.    . hydrALAZINE (APRESOLINE) 50 MG tablet TAKE 1 AND 1/2 TABLETS BY MOUTH THREE TIMES DAILY 135 tablet 1  . nitroGLYCERIN (NITROSTAT) 0.4 MG SL tablet Place 1 tablet (0.4 mg total) under the tongue every 5 (five) minutes as needed. (Patient taking differently: Place 0.4 mg under the tongue every 5 (five) minutes as needed for chest pain. ) 25 tablet 3  . oxyCODONE (OXY IR/ROXICODONE) 5 MG immediate release tablet Take 5 mg by mouth.    Marland Kitchen PARoxetine (PAXIL) 10 MG tablet Take 10 mg by mouth at bedtime as needed (panic attack).     Marland Kitchen PROAIR HFA 108 (90 Base) MCG/ACT inhaler Inhale 2 puffs into the lungs 4 (four) times daily as needed for wheezing.  5  . ranitidine (ZANTAC) 150 MG tablet Take 150 mg by mouth daily as needed for heartburn.    . traZODone (DESYREL) 50  MG tablet Take 50-100 mg by mouth at bedtime as needed for sleep.      No current facility-administered medications for this visit.    Facility-Administered Medications Ordered in Other Visits  Medication Dose Route Frequency Provider Last Rate Last Dose  . 0.9 %  sodium chloride infusion   Intravenous Continuous Monia Sabal, PA-C      . ceFAZolin (ANCEF) 2 g in dextrose 5 % 100 mL IVPB  2 g Intravenous Once Monia Sabal, PA-C        Past Medical History:  Diagnosis Date  . Anxiety    occas. panic attack, takes xanax occas  . Arthritis    herniated disc, lumbar   . Back pain   . CAD (coronary artery disease)    03/25/2015  95% prox to mid LAD treated with Promus Premier DES 3.5x40mm postdilated to 4.61mm, 100% prox to distal RCA lesion filled in the PDA region via collaterals from LAD and PLA  . Cancer ([place])    - skin ca on face- removed   . CHF (congestive heart failure) ([place])   . Chronic kidney disease   . Complication of anesthesia    " it takes a long time to get over it."   . Diabetes mellitus without complication ([place])    type 2  . Fibromyalgia   . GERD (gastroesophageal reflux disease)    otc- pepcid , approx. every other  day    . H/O acute respiratory failure   . Hematuria    being followed by Dr. Hinda Lenis for decreased kidney function   . History of blood product transfusion 02/13/2014   After having back surgery  . Hypercholesteremia   . Hypertension   . Myocardial infarction ([place]) 03/2015  . Pneumonia    "walking"  . Shortness of breath dyspnea   . Sleep apnea    test aborted, due to not able to relax , since the aborted test he had surgery for gallbladder & he reports that he was told that he has sleep apnea     Past Surgical History:  Procedure Laterality Date  . A/V FISTULAGRAM Left 10/31/2016   Procedure: A/V Fistulagram;  Surgeon: Serafina Mitchell, MD;  Location: [facility];  Service: Cardiovascular;  Laterality: Left;  . AV FISTULA PLACEMENT Left 02/02/2015   Procedure: LEFT ARM RADIOCEPHALIC ARTERIOVENOUS (AV) FISTULA CREATION;  Surgeon: Conrad [place], MD;  Location: [facility];  Service: Vascular;  Laterality: Left;  . AV FISTULA PLACEMENT Left 05/25/2016   Procedure: ARTERIOVENOUS (AV) FISTULA CREATION;  Surgeon: [name], MD;  Location: [facility];  Service: Vascular;  Laterality: Left;  . BACK SURGERY  02/13/14  . CARDIAC CATHETERIZATION N/A 03/25/2015   Procedure: Left Heart Cath and Coronary Angiography;  Surgeon: Leonie Man, MD;  Location: [facility];  Service: Cardiovascular;  Laterality: N/A;  . CARDIAC CATHETERIZATION N/A 03/25/2015   Procedure: Coronary  Stent Intervention;  Surgeon: Leonie Man, MD;  Location: [facility];  Service: Cardiovascular;  Laterality: N/A;  . CHOLECYSTECTOMY    . COLONOSCOPY    . EYE SURGERY     cataracts remove, bilateral, w/IOL.  Marland Kitchen INSERTION OF DIALYSIS CATHETER N/A 03/09/2015   Procedure: INSERTION OF DIALYSIS CATHETER;  Surgeon: Conrad Superior, MD;  Location: [facility];  Service: Vascular;  Laterality: N/A;  . INSERTION OF DIALYSIS CATHETER Left 04/07/2016   Procedure: INSERTION OF TUNNELED DIALYSIS CATHETER LEFT INTERNAL JUGULAR, ATTEMPTED INSERTION OF TUNNELLED CATHETER  RIGHT INTERNAL JUGULAR;  Surgeon: [name], MD;  Location: [facility];  Service: Vascular;  Laterality: Left;  . IR GENERIC HISTORICAL  07/28/2016   IR FLUORO GUIDE CV LINE LEFT 07/28/2016 Greggory Keen, MD [facility]  . LUMBAR LAMINECTOMY/DECOMPRESSION MICRODISCECTOMY Right 02/13/2014   Procedure: RIGHT LUMBAR FOUR-FIVE, RIGHT LUMBAR FIVE- SACRAL ONE LUMBAR LAMINECTOMY/DECOMPRESSION MICRODISCECTOMY ;  Surgeon: Ashok Pall, MD;  Location: [facility];  Service: Neurosurgery;  Laterality: Right;  Right L4-5 and Right L5-S1 diskectomies  . PERIPHERAL VASCULAR BALLOON ANGIOPLASTY Left 10/31/2016   Procedure: Peripheral Vascular Balloon Angioplasty;  Surgeon: Serafina Mitchell, MD;  Location: [facility];  Service: Cardiovascular;  Laterality: Left;  AV fistula  . TEE WITHOUT CARDIOVERSION N/A 05/02/2016   Procedure: TRANSESOPHAGEAL ECHOCARDIOGRAM (TEE);  Surgeon: Herminio Commons, MD;  [facility];  Service: Cardiovascular;  Laterality: N/A;  . TONSILLECTOMY      Social History   Social History  . Marital status: Married    Spouse name: N/A  . Number of children: 2  . Years of education: HS   Occupational History  . Not on file.   Social History Main Topics  . Smoking status: Former Smoker    Packs/day: 0.50    Years: 40.00    Types: Cigarettes    Start date: 10/17/1973    Quit date: 02/07/2015  . Smokeless  tobacco: Never Used  . Alcohol use No  . Drug use: No  . Sexual activity: Not on file   Other Topics Concern  . Not on file   Social History Narrative   Drinks 3 Diet sodas a day      Vitals:   01/31/17 1459  BP: (!) 154/76  Pulse: 81  SpO2: 93%  Weight: 235 lb (106.6 kg)  Height: 6' (1.829 m)    Wt Readings from Last 3 Encounters:  01/31/17 235 lb (106.6 kg)  10/31/16 282 lb (127.9 kg)  09/27/16 285 lb 9.6 oz (129.5 kg)     PHYSICAL EXAM General: NAD HEENT: Poor dentition. Neck: No JVD, no thyromegaly. Lungs: Clear to auscultation bilaterally with normal respiratory effort. CV: Nondisplaced PMI.  Regular rate and rhythm, normal S1/S2, no U9/W1, soft systolic murmur over b/l upper sternal borders. No pretibial or periankle edema.  No carotid bruit.   Abdomen: Soft, nontender, no distention.  Neurologic: Alert and oriented.  Psych: Normal affect. Skin: Normal. Musculoskeletal: No gross deformities.    ECG: Most recent ECG reviewed.   Labs: Lab Results  Component Value Date/Time   K 3.9 10/31/2016 06:13 AM   BUN 42 (H) 10/31/2016 06:13 AM   CREATININE 4.30 (H) 10/31/2016 06:13 AM   ALT 25 03/31/2016 07:20 PM   HGB 11.6 (L) 10/31/2016 06:13 AM     Lipids: Lab Results  Component Value Date/Time   LDLCALC 69 02/13/2015 02:25 AM   CHOL 111 02/13/2015 02:25 AM   TRIG 54 02/13/2015 02:25 AM   HDL 31 (L) 02/13/2015 02:25 AM       ASSESSMENT AND PLAN:  1. Chronic systolic heart failure/cardiomyopathy, EF 35-40%: Did not tolerate long-acting nitrates. Unable to use beta blocker due to long history of bradycardia and unable to use ACE inhibitors or ARB's due to end-stage renal disease.  Continue ASA, Lipitor, and Brilinta (I will reduce to 60 mg bid). Volume management via dialysis.  2. Hyperlipidemia: Continue Lipitor 40 mg daily.  3. Essential HTN: Elevated today. He said it is normally well controlled. He dialyzed  today. I will monitor.  4. ESRD:  On hemodialysis.   5. CAD s/p LAD stent: Stable. Continue ASA 81 mg daily, Brilinta, and Lipitor. Unable to use beta blockers at present due to long history of bradycardia.      Disposition: Follow up 6 months.   [name], M.D., F.A.C.C.

## 2017-02-15 ENCOUNTER — Telehealth: Payer: Self-pay | Admitting: *Deleted

## 2017-02-15 NOTE — Telephone Encounter (Signed)
Patient notified.  He will stop today.

## 2017-02-15 NOTE — Telephone Encounter (Signed)
Patient scheduled for Right L4-5 redo foraminotomy, Right L5-S1 foraminotomy and possible microdiscetomy via portal on 02-19-2017 with [facility].  Please advise how long to hold Brilinta prior to procedure.

## 2017-02-15 NOTE — Telephone Encounter (Signed)
Can be held up to 5 days prior.

## 2017-02-23 ENCOUNTER — Other Ambulatory Visit: Payer: Self-pay | Admitting: Cardiovascular Disease

## 2017-02-23 MED ORDER — HYDRALAZINE HCL 50 MG PO TABS: ORAL_TABLET | ORAL | 1 refills | Status: DC

## 2017-02-23 NOTE — Telephone Encounter (Signed)
REFILL: hydrALAZINE (APRESOLINE) 50 MG tablet  [facility]

## 2017-02-23 NOTE — Telephone Encounter (Signed)
Prescription sent

## 2017-03-01 ENCOUNTER — Telehealth: Payer: Self-pay | Admitting: Cardiovascular Disease

## 2017-03-01 NOTE — Telephone Encounter (Signed)
Patient called stating that he is taking BRILINTA 90 MG TABS tablet  He was told that it needs to be changed 65 mg .  Needs to be called to the Pharmacy with  The correct doseage. [place]. Please call [phone] (patient)

## 2017-03-01 NOTE — Telephone Encounter (Signed)
Left message to return call 

## 2017-03-02 MED ORDER — TICAGRELOR 60 MG PO TABS: 60.0000 mg | ORAL_TABLET | Freq: Two times a day (BID) | ORAL | 6 refills | Status: DC

## 2017-03-02 NOTE — Telephone Encounter (Signed)
Brilinta 60mg  twice a day sent to [facility] now.

## 2017-03-02 NOTE — Addendum Note (Signed)
Addended by: Laurine Blazer on: 03/02/2017 05:31 PM   Modules accepted: Orders

## 2017-03-22 ENCOUNTER — Other Ambulatory Visit (HOSPITAL_COMMUNITY): Payer: Self-pay | Admitting: Nephrology

## 2017-03-22 DIAGNOSIS — N186 End stage renal disease: Secondary | ICD-10-CM

## 2017-03-22 DIAGNOSIS — Z992 Dependence on renal dialysis: Principal | ICD-10-CM

## 2017-03-28 ENCOUNTER — Ambulatory Visit (HOSPITAL_COMMUNITY)
Admission: RE | Admit: 2017-03-28 | Discharge: 2017-03-28 | Disposition: A | Discharge status: Home / Self Care | Payer: Managed Care, Other (non HMO) | Source: Ambulatory Visit | Attending: Nephrology | Admitting: Nephrology

## 2017-03-28 ENCOUNTER — Encounter (HOSPITAL_COMMUNITY): Payer: Self-pay | Admitting: General Surgery

## 2017-03-28 DIAGNOSIS — Z4901 Encounter for fitting and adjustment of extracorporeal dialysis catheter: Secondary | ICD-10-CM | POA: Insufficient documentation

## 2017-03-28 DIAGNOSIS — N186 End stage renal disease: Secondary | ICD-10-CM

## 2017-03-28 DIAGNOSIS — Z992 Dependence on renal dialysis: Secondary | ICD-10-CM

## 2017-03-28 HISTORY — PX: IR REMOVAL TUN CV CATH W/O FL: IMG2289

## 2017-03-28 MED ORDER — LIDOCAINE HCL 1 % IJ SOLN
INTRAMUSCULAR | Status: AC
  Filled 2017-03-28: qty 20

## 2017-03-28 MED ORDER — CHLORHEXIDINE GLUCONATE 4 % EX LIQD
CUTANEOUS | Status: AC
  Filled 2017-03-28: qty 15

## 2017-03-28 NOTE — Procedures (Signed)
Successful removal of tunneled left IJ HD cath in its entirety. No EBL No complications Dry dressing in place.  Krupa Stege E 10:22 AM 03/28/2017

## 2017-04-04 ENCOUNTER — Telehealth: Payer: Self-pay | Admitting: Cardiovascular Disease

## 2017-04-04 NOTE — Telephone Encounter (Signed)
Notes faxed to [facility] office for Dr. Bronson Ing to review.

## 2017-04-04 NOTE — Telephone Encounter (Signed)
I do not have notes yet. However, he already has known valvular heart disease so a murmur is to be expected. He had a TEE last year demonstrating this.

## 2017-04-04 NOTE — Telephone Encounter (Signed)
Patient had to go to Urgent Care due to Fluid.  Was told to bring by a copy of note and ask Dr Bronson Ing if he needs to be seen due to Doctor at Urgent care hearing murmor. Notes give to nurse.

## 2017-04-05 NOTE — Telephone Encounter (Signed)
Patient notified

## 2017-04-06 ENCOUNTER — Other Ambulatory Visit: Payer: Self-pay | Admitting: Cardiovascular Disease

## 2017-08-14 ENCOUNTER — Ambulatory Visit (INDEPENDENT_AMBULATORY_CARE_PROVIDER_SITE_OTHER): Payer: Managed Care, Other (non HMO) | Admitting: Cardiovascular Disease

## 2017-08-14 ENCOUNTER — Encounter: Payer: Self-pay | Admitting: Cardiovascular Disease

## 2017-08-14 VITALS — BP 142/52 | HR 98 | Ht 72.0 in | Wt 234.0 lb

## 2017-08-14 DIAGNOSIS — I5022 Chronic systolic (congestive) heart failure: Secondary | ICD-10-CM | POA: Diagnosis not present

## 2017-08-14 DIAGNOSIS — I25118 Atherosclerotic heart disease of native coronary artery with other forms of angina pectoris: Secondary | ICD-10-CM | POA: Diagnosis not present

## 2017-08-14 DIAGNOSIS — I350 Nonrheumatic aortic (valve) stenosis: Secondary | ICD-10-CM

## 2017-08-14 DIAGNOSIS — E7800 Pure hypercholesterolemia, unspecified: Secondary | ICD-10-CM

## 2017-08-14 DIAGNOSIS — N186 End stage renal disease: Secondary | ICD-10-CM

## 2017-08-14 DIAGNOSIS — Z992 Dependence on renal dialysis: Secondary | ICD-10-CM

## 2017-08-14 DIAGNOSIS — Z955 Presence of coronary angioplasty implant and graft: Secondary | ICD-10-CM

## 2017-08-14 DIAGNOSIS — Z9289 Personal history of other medical treatment: Secondary | ICD-10-CM | POA: Diagnosis not present

## 2017-08-14 DIAGNOSIS — I10 Essential (primary) hypertension: Secondary | ICD-10-CM | POA: Diagnosis not present

## 2017-08-14 DIAGNOSIS — R001 Bradycardia, unspecified: Secondary | ICD-10-CM

## 2017-08-14 NOTE — Progress Notes (Signed)
SUBJECTIVE: The patient presents for routine follow-up. Coronary angiography on 03/25/15 demonstrated a 95% stenosis in the proximal to mid LAD for which a drug-eluting stent was placed. He also had 100% stenosis of the proximal to distal RCA lesion. The RCA filled in the PDA region via collaterals from the LAD and PL via the circumflex. Long-term Brilinta was recommended.  I performed a TEE on 05/02/16 which demonstrated moderately reduced left ventricular systolic function, LVEF 40-98%, wall motion abnormalities, mild to moderate aortic regurgitation.  He has end-stage renal disease and is on dialysis.  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  He tells me he was hospitalized at [facility] for right lower extremity cellulitis in August 2018 and then at [facility] at Christmas time for pneumonia.  At some point between his last visit with me and now, both Brilinta and Lipitor was stopped.  He is uncertain as to the reason why.  He also says it is possible he forgot to refill the Lipitor.  He currently denies exertional chest pain.  He is due for dialysis tomorrow.  His primary complaints relate to back pain for which he is receiving injections tomorrow and may receive a spinal stimulator and also may undergo a repeat back surgery.      Review of Systems: As per "subjective", otherwise negative.  Allergies  Allergen Reactions  . Morphine And Related     Upset stomach     Current Outpatient Medications  Medication Sig Dispense Refill  . acetaminophen (TYLENOL) 500 MG tablet Take 500 mg by mouth daily as needed for mild pain or moderate pain.     Marland Kitchen aspirin EC 81 MG tablet Take 81 mg by mouth every evening.     Marland Kitchen atorvastatin (LIPITOR) 40 MG tablet Take 40 mg by mouth at bedtime.    . B Complex-C-Folic Acid (RENA-VITE RX) 1  MG TABS Take 1 mg by mouth daily.   99  . calcium carbonate (TUMS - DOSED IN MG ELEMENTAL CALCIUM) 500 MG chewable tablet Chew 1,000 mg by mouth 3 (three) times daily with meals.     . cetirizine (ZYRTEC) 10 MG tablet Take 10 mg by mouth daily as needed for allergies.    Marland Kitchen docusate sodium (COLACE) 100 MG capsule Take 200 mg by mouth daily.    . fentaNYL (DURAGESIC - DOSED MCG/HR) 25 MCG/HR patch Place onto the skin.    . fluticasone (FLONASE) 50 MCG/ACT nasal spray Place 2 sprays into both nostrils daily as needed for allergies or rhinitis.    . hydrALAZINE (APRESOLINE) 50 MG tablet TAKE 1 AND 1/2 TABLETS BY MOUTH THREE TIMES DAILY 135 tablet 6  . nitroGLYCERIN (NITROSTAT) 0.4 MG SL tablet Place 1 tablet (0.4 mg total) under the tongue every 5 (five) minutes as needed. (Patient taking differently: Place 0.4 mg under the tongue every 5 (five) minutes as needed for chest pain. ) 25 tablet 3  . oxyCODONE (OXY IR/ROXICODONE) 5 MG immediate release tablet Take 5 mg by mouth.    Marland Kitchen PROAIR HFA 108 (90 Base) MCG/ACT inhaler Inhale 2 puffs into the lungs 4 (four) times daily as needed for wheezing.  5  . ticagrelor (BRILINTA) 60 MG TABS tablet Take 1 tablet (60 mg total) by mouth 2 (two) times daily. 60 tablet 6  . traZODone (DESYREL) 50  MG tablet Take 50-100 mg by mouth at bedtime as needed for sleep.      No current facility-administered medications for this visit.    Facility-Administered Medications Ordered in Other Visits  Medication Dose Route Frequency Provider Last Rate Last Dose  . 0.9 %  sodium chloride infusion   Intravenous Continuous Monia Sabal, PA-C      . ceFAZolin (ANCEF) 2 g in dextrose 5 % 100 mL IVPB  2 g Intravenous Once Monia Sabal, PA-C        Past Medical History:  Diagnosis Date  . Anxiety    occas. panic attack, takes xanax occas  . Arthritis    herniated disc, lumbar   . Back pain   . CAD (coronary artery disease)    03/25/2015 95% prox to mid LAD treated with  Promus Premier DES 3.5x61mm postdilated to 4.52mm, 100% prox to distal RCA lesion filled in the PDA region via collaterals from LAD and PLA  . Cancer ([place])    - skin ca on face- removed   . CHF (congestive heart failure) ([place])   . Chronic kidney disease   . Complication of anesthesia    " it takes a long time to get over it."   . Diabetes mellitus without complication ([place])    type 2  . Fibromyalgia   . GERD (gastroesophageal reflux disease)    otc- pepcid , approx. every other  day    . H/O acute respiratory failure   . Hematuria    being followed by Dr. Hinda Lenis for decreased kidney function   . History of blood product transfusion 02/13/2014   After having back surgery  . Hypercholesteremia   . Hypertension   . Myocardial infarction ([place]) 03/2015  . Pneumonia    "walking"  . Shortness of breath dyspnea   . Sleep apnea    test aborted, due to not able to relax , since the aborted test he had surgery for gallbladder & he reports that he was told that he has sleep apnea     Past Surgical History:  Procedure Laterality Date  . A/V FISTULAGRAM Left 10/31/2016   Procedure: A/V Fistulagram;  Surgeon: Serafina Mitchell, MD;  Location: [place] [facility];  Service: Cardiovascular;  Laterality: Left;  . AV FISTULA PLACEMENT Left 02/02/2015   Procedure: LEFT ARM RADIOCEPHALIC ARTERIOVENOUS (AV) FISTULA CREATION;  Surgeon: Conrad Glasgow, MD;  Location: [place];  Service: Vascular;  Laterality: Left;  . AV FISTULA PLACEMENT Left 05/25/2016   Procedure: ARTERIOVENOUS (AV) FISTULA CREATION;  Surgeon: [name], MD;  [facility];  Service: Vascular;  Laterality: Left;  . BACK SURGERY  02/13/14  . CARDIAC CATHETERIZATION N/A 03/25/2015   Procedure: Left Heart Cath and Coronary Angiography;  Surgeon: Leonie Man, MD;  Location: [place] [facility];  Service: Cardiovascular;  Laterality: N/A;  . CARDIAC CATHETERIZATION N/A 03/25/2015   Procedure: Coronary Stent Intervention;  Surgeon:  Leonie Man, MD;  Location: [place] [facility];  Service: Cardiovascular;  Laterality: N/A;  . CHOLECYSTECTOMY    . COLONOSCOPY    . EYE SURGERY     cataracts remove, bilateral, w/IOL.  Marland Kitchen INSERTION OF DIALYSIS CATHETER N/A 03/09/2015   Procedure: INSERTION OF DIALYSIS CATHETER;  Surgeon: Conrad [place], MD;  Location: [place];  Service: Vascular;  Laterality: N/A;  . INSERTION OF DIALYSIS CATHETER Left 04/07/2016   Procedure: INSERTION OF TUNNELED DIALYSIS CATHETER LEFT INTERNAL JUGULAR, ATTEMPTED INSERTION OF TUNNELLED CATHETER  RIGHT INTERNAL JUGULAR;  Surgeon: [name], MD;  [facility];  Service: Vascular;  Laterality: Left;  . IR GENERIC HISTORICAL  07/28/2016   IR FLUORO GUIDE CV LINE LEFT 07/28/2016 Greggory Keen, MD [facility]  . IR REMOVAL TUN CV CATH W/O FL  03/28/2017  . LUMBAR LAMINECTOMY/DECOMPRESSION MICRODISCECTOMY Right 02/13/2014   Procedure: RIGHT LUMBAR FOUR-FIVE, RIGHT LUMBAR FIVE- SACRAL ONE LUMBAR LAMINECTOMY/DECOMPRESSION MICRODISCECTOMY ;  Surgeon: Ashok Pall, MD;  Location: [place] [facility];  Service: Neurosurgery;  Laterality: Right;  Right L4-5 and Right L5-S1 diskectomies  . PERIPHERAL VASCULAR BALLOON ANGIOPLASTY Left 10/31/2016   Procedure: Peripheral Vascular Balloon Angioplasty;  Surgeon: Serafina Mitchell, MD;  Location: [place] [facility];  Service: Cardiovascular;  Laterality: Left;  AV fistula  . TEE WITHOUT CARDIOVERSION N/A 05/02/2016   Procedure: TRANSESOPHAGEAL ECHOCARDIOGRAM (TEE);  Surgeon: Herminio Commons, MD;  [facility];  Service: Cardiovascular;  Laterality: N/A;  . TONSILLECTOMY      Social History   Socioeconomic History  . Marital status: Married    Spouse name: Not on file  . Number of children: 2  . Years of education: HS  . Highest education level: Not on file  Social Needs  . Financial resource strain: Not on file  . Food insecurity - worry: Not on file  . Food insecurity - inability: Not on file  .  Transportation needs - medical: Not on file  . Transportation needs - non-medical: Not on file  Occupational History  . Not on file  Tobacco Use  . Smoking status: Former Smoker    Packs/day: 0.50    Years: 40.00    Pack years: 20.00    Types: Cigarettes    Start date: 10/17/1973    Last attempt to quit: 02/07/2015    Years since quitting: 2.5  . Smokeless tobacco: Never Used  Substance and Sexual Activity  . Alcohol use: No    Alcohol/week: 0.0 oz  . Drug use: No  . Sexual activity: Not on file  Other Topics Concern  . Not on file  Social History Narrative   Drinks 3 Diet sodas a day      Vitals:   08/14/17 0901  Weight: 234 lb (106.1 kg)  Height: 6' (1.829 m)    Wt Readings from Last 3 Encounters:  08/14/17 234 lb (106.1 kg)  01/31/17 235 lb (106.6 kg)  10/31/16 282 lb (127.9 kg)     PHYSICAL EXAM General: NAD HEENT: Normal. Neck: No JVD, no thyromegaly. Lungs: Clear to auscultation bilaterally with normal respiratory effort. CV: Regular rate and rhythm, normal S1/S2, no B3/A1, soft systolic murmur over b/l upper sternal borders. No pretibial or periankle edema.  No carotid bruit.   Abdomen: Soft, nontender, no distention.  Neurologic: Alert and oriented.  Psych: Normal affect. Skin: Normal. Musculoskeletal: No gross deformities.    ECG: Most recent ECG reviewed.   Labs: Lab Results  Component Value Date/Time   K 3.9 10/31/2016 06:13 AM   BUN 42 (H) 10/31/2016 06:13 AM   CREATININE 4.30 (H) 10/31/2016 06:13 AM   ALT 25 03/31/2016 07:20 PM   HGB 11.6 (L) 10/31/2016 06:13 AM     Lipids: Lab Results  Component Value Date/Time   LDLCALC 69 02/13/2015 02:25 AM   CHOL 111 02/13/2015 02:25 AM   TRIG 54 02/13/2015 02:25 AM   HDL 31 (L) 02/13/2015 02:25 AM       ASSESSMENT AND PLAN: 1. Chronic systolic heart failure/cardiomyopathy,  EF 35-40%: Did not tolerate long-acting nitrates. Unable to use beta blocker due to long history of bradycardia and  unable to use ACE inhibitors or ARB's due to end-stage renal disease. Volume management via dialysis.  2. Hyperlipidemia: No longer on Lipitor.  Refer to discussion and #1.  3. Essential HTN: Mildly elevated.  He is now on both clonidine and hydralazine.  I will monitor.  4. ESRD: On hemodialysis.   5. CAD s/p LAD stent: Symptomatically stable. Continue ASA.  He is no longer on Lipitor or Brilinta.  I will try and obtain discharge summaries from both [place].   I think it is fine to remain off Brilinta but Lipitor would be an important medication for him. Unable to use beta blockers at present due to long history of bradycardia.   6.  Mild aortic stenosis: Stable.  Previous echocardiograms reviewed.    Disposition: Follow up 6 months   [name], M.D., F.A.C.C.

## 2017-08-14 NOTE — Patient Instructions (Signed)

## 2017-08-21 ENCOUNTER — Other Ambulatory Visit: Payer: Self-pay | Admitting: *Deleted

## 2017-08-21 MED ORDER — ATORVASTATIN CALCIUM 40 MG PO TABS: 40.0000 mg | ORAL_TABLET | Freq: Every day | ORAL | 3 refills | Status: DC

## 2018-02-07 ENCOUNTER — Ambulatory Visit (INDEPENDENT_AMBULATORY_CARE_PROVIDER_SITE_OTHER): Payer: Medicare Other | Admitting: Internal Medicine

## 2018-02-07 ENCOUNTER — Encounter (INDEPENDENT_AMBULATORY_CARE_PROVIDER_SITE_OTHER): Payer: Self-pay | Admitting: Internal Medicine

## 2018-02-07 ENCOUNTER — Telehealth (INDEPENDENT_AMBULATORY_CARE_PROVIDER_SITE_OTHER): Payer: Self-pay | Admitting: *Deleted

## 2018-02-07 VITALS — BP 176/78 | HR 60 | Temp 98.3°F | Ht 72.0 in | Wt 227.7 lb

## 2018-02-07 DIAGNOSIS — I25118 Atherosclerotic heart disease of native coronary artery with other forms of angina pectoris: Secondary | ICD-10-CM

## 2018-02-07 DIAGNOSIS — D696 Thrombocytopenia, unspecified: Secondary | ICD-10-CM | POA: Diagnosis not present

## 2018-02-07 LAB — CBC WITH DIFFERENTIAL/PLATELET
Basophils Absolute: 69 cells/uL (ref 0–200)
Basophils Relative: 1.5 %
EOS PCT: 6.7 %
Eosinophils Absolute: 308 cells/uL (ref 15–500)
HEMATOCRIT: 32.5 % — AB (ref 38.5–50.0)
Hemoglobin: 10.2 g/dL — ABNORMAL LOW (ref 13.2–17.1)
LYMPHS ABS: 377 {cells}/uL — AB (ref 850–3900)
MCH: 27.4 pg (ref 27.0–33.0)
MCHC: 31.4 g/dL — ABNORMAL LOW (ref 32.0–36.0)
MCV: 87.4 fL (ref 80.0–100.0)
MPV: 9.6 fL (ref 7.5–12.5)
Monocytes Relative: 12.7 %
NEUTROS PCT: 70.9 %
Neutro Abs: 3261 cells/uL (ref 1500–7800)
PLATELETS: 141 10*3/uL (ref 140–400)
RBC: 3.72 10*6/uL — AB (ref 4.20–5.80)
RDW: 13.4 % (ref 11.0–15.0)
TOTAL LYMPHOCYTE: 8.2 %
WBC mixed population: 584 cells/uL (ref 200–950)
WBC: 4.6 10*3/uL (ref 3.8–10.8)

## 2018-02-07 NOTE — Telephone Encounter (Signed)
Patient has dialysis on Monday, Wednesday and Friday -- Mark [name]'s only propofol day is Friday -- please call dialysis and see if there is work around so we can schedule procedure

## 2018-02-07 NOTE — Progress Notes (Signed)
Subjective:    Patient ID: Mark [name], male    DOB: 08/29/1955, 62 y.o.   MRN: [id number]  HPI Referred by Dr. Lorra Hals for positive cologuard. Last colonoscopy was 4 yrs by Dr. Yaakov Guthrie. He says he had polyps. (Will get those records).  He saw rectal bleeding 3 weeks ago. Saw for about a week. Was constipated during this time.  His appetite is okay.  About 3 weeks ago, he did not feel well. He says he was having trouble swallowing, no appetite. He says he has these symptoms 3-4 times a year. All his symptoms have now resolved.  He thinks he may have had a virus.  Has a BM every 3 days. Stools are formed. No melena.  Does have chronic constipation and takes a stool softener and sometimes he has to take an enema.   Had a Modified barium swallow: at [facility].  FINDINGS:  The patient ingests all thicknesses of liquid barium present and barium coated cookie without difficulty. No aspiration is seen within the consistency of barium. Transient penetration to the vocal cords is visualized upon ingestion of the barium cracker. Additionally, there is transient deep penetration of thin barium during post cracker washout. There is mild spillover to the vallecula upon ingestion of pureed barium.  Hx of idiopathic thrombocytopenic purpura. Hx of CRF and on dialysis.  Hx of CAD, CHF, DM MI 3 yrs ago and has one stent at [facility].  Takes Dialysis M-W-F.   Review of Systems Past Medical History:  Diagnosis Date  . Anxiety    occas. panic attack, takes xanax occas  . Arthritis    herniated disc, lumbar   . Back pain   . CAD (coronary artery disease)    03/25/2015 95% prox to mid LAD treated with Promus Premier DES 3.5x23mm postdilated to 4.49mm, 100% prox to distal RCA lesion filled in the PDA region via collaterals from LAD and PLA  . Cancer ([place])    - skin ca on face- removed   . CHF (congestive heart failure) ([place])   . Chronic kidney disease   . Complication of anesthesia    " it takes a long time to  get over it."   . Diabetes mellitus without complication ([place])    type 2  . Fibromyalgia   . GERD (gastroesophageal reflux disease)    otc- pepcid , approx. every other  day    . H/O acute respiratory failure   . Hematuria    being followed by Dr. Hinda Lenis for decreased kidney function   . History of blood product transfusion 02/13/2014   After having back surgery  . Hypercholesteremia   . Hypertension   . Myocardial infarction ([place]) 03/2015  . Pneumonia    "walking"  . Shortness of breath dyspnea   . Sleep apnea    test aborted, due to not able to relax , since the aborted test he had surgery for gallbladder & he reports that he was told that he has sleep apnea     Past Surgical History:  Procedure Laterality Date  . A/V FISTULAGRAM Left 10/31/2016   Procedure: A/V Fistulagram;  Surgeon: Serafina Mitchell, MD;  Location: [place] [facility];  Service: Cardiovascular;  Laterality: Left;  . AV FISTULA PLACEMENT Left 02/02/2015   Procedure: LEFT ARM RADIOCEPHALIC ARTERIOVENOUS (AV) FISTULA CREATION;  Surgeon: Conrad [place], MD;  Location: [place];  Service: Vascular;  Laterality: Left;  . AV FISTULA PLACEMENT Left 05/25/2016   Procedure: ARTERIOVENOUS (AV)  FISTULA CREATION;  Surgeon: [name], MD;  [facility];  Service: Vascular;  Laterality: Left;  . BACK SURGERY  02/13/14  . CARDIAC CATHETERIZATION N/A 03/25/2015   Procedure: Left Heart Cath and Coronary Angiography;  Surgeon: Leonie Man, MD;  Location: [place] [facility];  Service: Cardiovascular;  Laterality: N/A;  . CARDIAC CATHETERIZATION N/A 03/25/2015   Procedure: Coronary Stent Intervention;  Surgeon: Leonie Man, MD;  Location: [place] [facility];  Service: Cardiovascular;  Laterality: N/A;  . CHOLECYSTECTOMY    . COLONOSCOPY    . EYE SURGERY     cataracts remove, bilateral, w/IOL.  Marland Kitchen INSERTION OF DIALYSIS CATHETER N/A 03/09/2015   Procedure: INSERTION OF DIALYSIS CATHETER;  Surgeon: Conrad [place], MD;   Location: [place];  Service: Vascular;  Laterality: N/A;  . INSERTION OF DIALYSIS CATHETER Left 04/07/2016   Procedure: INSERTION OF TUNNELED DIALYSIS CATHETER LEFT INTERNAL JUGULAR, ATTEMPTED INSERTION OF TUNNELLED CATHETER RIGHT INTERNAL JUGULAR;  Surgeon: [name], MD;  [facility];  Service: Vascular;  Laterality: Left;  . IR GENERIC HISTORICAL  07/28/2016   IR FLUORO GUIDE CV LINE LEFT 07/28/2016 Greggory Keen, MD [facility]  . IR REMOVAL TUN CV CATH W/O FL  03/28/2017  . LUMBAR LAMINECTOMY/DECOMPRESSION MICRODISCECTOMY Right 02/13/2014   Procedure: RIGHT LUMBAR FOUR-FIVE, RIGHT LUMBAR FIVE- SACRAL ONE LUMBAR LAMINECTOMY/DECOMPRESSION MICRODISCECTOMY ;  Surgeon: Ashok Pall, MD;  Location: [place] [facility];  Service: Neurosurgery;  Laterality: Right;  Right L4-5 and Right L5-S1 diskectomies  . PERIPHERAL VASCULAR BALLOON ANGIOPLASTY Left 10/31/2016   Procedure: Peripheral Vascular Balloon Angioplasty;  Surgeon: Serafina Mitchell, MD;  Location: [place] [facility];  Service: Cardiovascular;  Laterality: Left;  AV fistula  . TEE WITHOUT CARDIOVERSION N/A 05/02/2016   Procedure: TRANSESOPHAGEAL ECHOCARDIOGRAM (TEE);  Surgeon: Herminio Commons, MD;  [facility];  Service: Cardiovascular;  Laterality: N/A;  . TONSILLECTOMY      Allergies  Allergen Reactions  . Morphine And Related     Upset stomach     Current Outpatient Medications on File Prior to Visit  Medication Sig Dispense Refill  . aspirin EC 81 MG tablet Take 81 mg by mouth daily.    Marland Kitchen atorvastatin (LIPITOR) 40 MG tablet Take 40 mg by mouth daily.    . B Complex-C-Folic Acid (VP-VITE RX PO) Take by mouth.    . Buprenorphine HCl (BELBUCA) 750 MCG FILM Place 750 mcg inside cheek 2 (two) times daily.    . cyclobenzaprine (FLEXERIL) 5 MG tablet Take 5 mg by mouth 2 (two) times daily.    Marland Kitchen docusate sodium (COLACE) 100 MG capsule Take 100 mg by mouth daily.    . DULoxetine (CYMBALTA) 30 MG capsule Take 30 mg by  mouth daily.    . hydrALAZINE (APRESOLINE) 50 MG tablet TAKE 1 AND 1/2 TABLETS BY MOUTH THREE TIMES DAILY 135 tablet 6  . nitroGLYCERIN (NITROSTAT) 0.4 MG SL tablet Place 1 tablet (0.4 mg total) under the tongue every 5 (five) minutes as needed. (Patient taking differently: Place 0.4 mg under the tongue every 5 (five) minutes as needed for chest pain. ) 25 tablet 3  . ondansetron (ZOFRAN) 4 MG tablet Take 4 mg by mouth every 8 (eight) hours as needed for nausea or vomiting.    . OXYGEN Inhale into the lungs. 2 LITERS AT BEDTIME    . ranitidine (ZANTAC) 150 MG capsule Take 150 mg by mouth daily.    . traZODone (DESYREL)  50 MG tablet Take 50-100 mg by mouth at bedtime as needed for sleep.     Marland Kitchen atorvastatin (LIPITOR) 40 MG tablet Take 1 tablet (40 mg total) by mouth daily. 90 tablet 3   Current Facility-Administered Medications on File Prior to Visit  Medication Dose Route Frequency Provider Last Rate Last Dose  . 0.9 %  sodium chloride infusion   Intravenous Continuous Monia Sabal, PA-C      . ceFAZolin (ANCEF) 2 g in dextrose 5 % 100 mL IVPB  2 g Intravenous Once Monia Sabal, PA-C            Objective:   Physical Exam Blood pressure (!) 176/78, pulse 60, temperature 98.3 F (36.8 C), height 6' (1.829 m), weight 227 lb 11.2 oz (103.3 kg). Alert and oriented. Skin warm and dry. Oral mucosa is moist.   . Sclera anicteric, conjunctivae is pink. Thyroid not enlarged. No cervical lymphadenopathy. Lungs clear. Heart regular rate and rhythm.  Abdomen is soft. Bowel sounds are positive. No hepatomegaly. No abdominal masses felt. No tenderness.  Cellulitis to rt lower leg.  AV fistula left arm.         Assessment & Plan:  Positive cologuard. Colonic neoplasm needs to be ruled out. Colonoscopy.  The risks of bleeding, perforation and infection were reviewed with patient.

## 2018-02-07 NOTE — Patient Instructions (Signed)
The risks of bleeding, perforation and infection were reviewed with patient.  

## 2018-02-08 NOTE — Telephone Encounter (Signed)
He goes to [facility] in [place] [phone]) -- Oct 4th & Oct 11th are the 2 propofol days we have

## 2018-02-08 NOTE — Telephone Encounter (Signed)
Find out where he takes dialysis and I will try to call today

## 2018-02-08 NOTE — Telephone Encounter (Signed)
I talked with Dialysis. What ever day is fine. Dialysis says he just needs to call and rearrange the Friday to a Saturday.

## 2018-02-11 ENCOUNTER — Encounter (INDEPENDENT_AMBULATORY_CARE_PROVIDER_SITE_OTHER): Payer: Self-pay | Admitting: *Deleted

## 2018-02-11 ENCOUNTER — Telehealth (INDEPENDENT_AMBULATORY_CARE_PROVIDER_SITE_OTHER): Payer: Self-pay | Admitting: *Deleted

## 2018-02-11 ENCOUNTER — Other Ambulatory Visit (INDEPENDENT_AMBULATORY_CARE_PROVIDER_SITE_OTHER): Payer: Self-pay | Admitting: Internal Medicine

## 2018-02-11 DIAGNOSIS — R195 Other fecal abnormalities: Secondary | ICD-10-CM

## 2018-02-11 MED ORDER — SUPREP BOWEL PREP KIT 17.5-3.13-1.6 GM/177ML PO SOLN: 1.0000 | Freq: Once | ORAL | 0 refills | Status: AC

## 2018-02-11 NOTE — Telephone Encounter (Signed)
Patient needs suprep 

## 2018-02-11 NOTE — Telephone Encounter (Signed)
TCS sch'd 03/08/18, left detailed message for patient, instructions mailed

## 2018-03-01 ENCOUNTER — Other Ambulatory Visit (HOSPITAL_COMMUNITY): Payer: Managed Care, Other (non HMO)

## 2018-03-01 NOTE — Patient Instructions (Signed)
Mark [name]  03/01/2018     @PREFPERIOPPHARMACY @   Your procedure is scheduled on  03/08/2018   Report to [facility] at  845  A.M.  Call this number if you have problems the morning of surgery:  [phone]   Remember:  Do not eat or drink after midnight.  You may drink clear liquids until  (follow the instructions given to you) .  Clear liquids allowed are:                    Water, Juice (non-citric and without pulp), Carbonated beverages, Clear Tea, Black Coffee only, Plain Jell-O only, Gatorade and Plain Popsicles only    Take these medicines the morning of surgery with A SIP OF WATER  Amlodipine, belbuca( if needed), flexaril ( if needed), cymbalta, apresoline, zofran, zanac. Use your inhaler before oyu come.    Do not wear jewelry, make-up or nail polish.  Do not wear lotions, powders, or perfumes, or deodorant.  Do not shave 48 hours prior to surgery.  Men may shave face and neck.  Do not bring valuables to the hospital.  [facility] is not responsible for any belongings or valuables.  Contacts, dentures or bridgework may not be worn into surgery.  Leave your suitcase in the car.  After surgery it may be brought to your room.  For patients admitted to the hospital, discharge time will be determined by your treatment team.  Patients discharged the day of surgery will not be allowed to drive home.   Name and phone number of your driver:   family Special instructions:  Follow the diet and prep instructions given to you by Dr Olevia Perches office.  Please read over the following fact sheets that you were given. Anesthesia Post-op Instructions and Care and Recovery After Surgery       Colonoscopy, Adult A colonoscopy is an exam to look at the large intestine. It is done to check for problems, such as:  Lumps (tumors).  Growths (polyps).  Swelling (inflammation).  Bleeding.  What happens before the procedure? Eating and drinking Follow instructions  from your doctor about eating and drinking. These instructions may include:  A few days before the procedure - follow a low-fiber diet. ? Avoid nuts. ? Avoid seeds. ? Avoid dried fruit. ? Avoid raw fruits. ? Avoid vegetables.  1-3 days before the procedure - follow a clear liquid diet. Avoid liquids that have red or purple dye. Drink only clear liquids, such as: ? Clear broth or bouillon. ? Black coffee or tea. ? Clear juice. ? Clear soft drinks or sports drinks. ? Gelatin dessert. ? Popsicles.  On the day of the procedure - do not eat or drink anything during the 2 hours before the procedure.  Bowel prep If you were prescribed an oral bowel prep:  Take it as told by your doctor. Starting the day before your procedure, you will need to drink a lot of liquid. The liquid will cause you to poop (have bowel movements) until your poop is almost clear or light green.  If your skin or butt gets irritated from diarrhea, you may: ? Wipe the area with wipes that have medicine in them, such as adult wet wipes with aloe and vitamin E. ? Put something on your skin that soothes the area, such as petroleum jelly.  If you throw up (vomit) while drinking the bowel prep, take a break for up  to 60 minutes. Then begin the bowel prep again. If you keep throwing up and you cannot take the bowel prep without throwing up, call your doctor.  General instructions  Ask your doctor about changing or stopping your normal medicines. This is important if you take diabetes medicines or blood thinners.  Plan to have someone take you home from the hospital or clinic. What happens during the procedure?  An IV tube may be put into one of your veins.  You will be given medicine to help you relax (sedative).  To reduce your risk of infection: ? Your doctors will wash their hands. ? Your anal area will be washed with soap.  You will be asked to lie on your side with your knees bent.  Your doctor will get a  long, thin, flexible tube ready. The tube will have a camera and a light on the end.  The tube will be put into your anus.  The tube will be gently put into your large intestine.  Air will be delivered into your large intestine to keep it open. You may feel some pressure or cramping.  The camera will be used to take photos.  A small tissue sample may be removed from your body to be looked at under a microscope (biopsy). If any possible problems are found, the tissue will be sent to a lab for testing.  If small growths are found, your doctor may remove them and have them checked for cancer.  The tube that was put into your anus will be slowly removed. The procedure may vary among doctors and hospitals. What happens after the procedure?  Your doctor will check on you often until the medicines you were given have worn off.  Do not drive for 24 hours after the procedure.  You may have a small amount of blood in your poop.  You may pass gas.  You may have mild cramps or bloating in your belly (abdomen).  It is up to you to get the results of your procedure. Ask your doctor, or the department performing the procedure, when your results will be ready. This information is not intended to replace advice given to you by your health care provider. Make sure you discuss any questions you have with your health care provider. Document Released: 06/24/2010 Document Revised: 03/22/2016 Document Reviewed: 08/03/2015 Elsevier Interactive Patient Education  2017 [employer]  Colonoscopy, Adult, Care After This sheet gives you information about how to care for yourself after your procedure. Your health care provider may also give you more specific instructions. If you have problems or questions, contact your health care provider. What can I expect after the procedure? After the procedure, it is common to have:  A small amount of blood in your stool for 24 hours after the procedure.  Some  gas.  Mild abdominal cramping or bloating.  Follow these instructions at home: General instructions   For the first 24 hours after the procedure: ? Do not drive or use machinery. ? Do not sign important documents. ? Do not drink alcohol. ? Do your regular daily activities at a slower pace than normal. ? Eat soft, easy-to-digest foods. ? Rest often.  Take over-the-counter or prescription medicines only as told by your health care provider.  It is up to you to get the results of your procedure. Ask your health care provider, or the department performing the procedure, when your results will be ready. Relieving cramping and bloating  Try walking around  when you have cramps or feel bloated.  Apply heat to your abdomen as told by your health care provider. Use a heat source that your health care provider recommends, such as a moist heat pack or a heating pad. ? Place a towel between your skin and the heat source. ? Leave the heat on for 20-30 minutes. ? Remove the heat if your skin turns bright red. This is especially important if you are unable to feel pain, heat, or cold. You may have a greater risk of getting burned. Eating and drinking  Drink enough fluid to keep your urine clear or pale yellow.  Resume your normal diet as instructed by your health care provider. Avoid heavy or fried foods that are hard to digest.  Avoid drinking alcohol for as long as instructed by your health care provider. Contact a health care provider if:  You have blood in your stool 2-3 days after the procedure. Get help right away if:  You have more than a small spotting of blood in your stool.  You pass large blood clots in your stool.  Your abdomen is swollen.  You have nausea or vomiting.  You have a fever.  You have increasing abdominal pain that is not relieved with medicine. This information is not intended to replace advice given to you by your health care provider. Make sure you  discuss any questions you have with your health care provider. Document Released: 01/04/2004 Document Revised: 02/14/2016 Document Reviewed: 08/03/2015 Elsevier Interactive Patient Education  2018 [place] Anesthesia is a term that refers to techniques, procedures, and medicines that help a person stay safe and comfortable during a medical procedure. Monitored anesthesia care, or sedation, is one type of anesthesia. Your anesthesia specialist may recommend sedation if you will be having a procedure that does not require you to be unconscious, such as:  Cataract surgery.  A dental procedure.  A biopsy.  A colonoscopy.  During the procedure, you may receive a medicine to help you relax (sedative). There are three levels of sedation:  Mild sedation. At this level, you may feel awake and relaxed. You will be able to follow directions.  Moderate sedation. At this level, you will be sleepy. You may not remember the procedure.  Deep sedation. At this level, you will be asleep. You will not remember the procedure.  The more medicine you are given, the deeper your level of sedation will be. Depending on how you respond to the procedure, the anesthesia specialist may change your level of sedation or the type of anesthesia to fit your needs. An anesthesia specialist will monitor you closely during the procedure. Let your health care provider know about:  Any allergies you have.  All medicines you are taking, including vitamins, herbs, eye drops, creams, and over-the-counter medicines.  Any use of steroids (by mouth or as a cream).  Any problems you or family members have had with sedatives and anesthetic medicines.  Any blood disorders you have.  Any surgeries you have had.  Any medical conditions you have, such as sleep apnea.  Whether you are pregnant or may be pregnant.  Any use of cigarettes, alcohol, or street drugs. What are the  risks? Generally, this is a safe procedure. However, problems may occur, including:  Getting too much medicine (oversedation).  Nausea.  Allergic reaction to medicines.  Trouble breathing. If this happens, a breathing tube may be used to help with breathing. It will be removed when  you are awake and breathing on your own.  Heart trouble.  Lung trouble.  Before the procedure Staying hydrated Follow instructions from your health care provider about hydration, which may include:  Up to 2 hours before the procedure - you may continue to drink clear liquids, such as water, clear fruit juice, black coffee, and plain tea.  Eating and drinking restrictions Follow instructions from your health care provider about eating and drinking, which may include:  8 hours before the procedure - stop eating heavy meals or foods such as meat, fried foods, or fatty foods.  6 hours before the procedure - stop eating light meals or foods, such as toast or cereal.  6 hours before the procedure - stop drinking milk or drinks that contain milk.  2 hours before the procedure - stop drinking clear liquids.  Medicines Ask your health care provider about:  Changing or stopping your regular medicines. This is especially important if you are taking diabetes medicines or blood thinners.  Taking medicines such as aspirin and ibuprofen. These medicines can thin your blood. Do not take these medicines before your procedure if your health care provider instructs you not to.  Tests and exams  You will have a physical exam.  You may have blood tests done to show: ? How well your kidneys and liver are working. ? How well your blood can clot.  General instructions  Plan to have someone take you home from the hospital or clinic.  If you will be going home right after the procedure, plan to have someone with you for 24 hours.  What happens during the procedure?  Your blood pressure, heart rate, breathing,  level of pain and overall condition will be monitored.  An IV tube will be inserted into one of your veins.  Your anesthesia specialist will give you medicines as needed to keep you comfortable during the procedure. This may mean changing the level of sedation.  The procedure will be performed. After the procedure  Your blood pressure, heart rate, breathing rate, and blood oxygen level will be monitored until the medicines you were given have worn off.  Do not drive for 24 hours if you received a sedative.  You may: ? Feel sleepy, clumsy, or nauseous. ? Feel forgetful about what happened after the procedure. ? Have a sore throat if you had a breathing tube during the procedure. ? Vomit. This information is not intended to replace advice given to you by your health care provider. Make sure you discuss any questions you have with your health care provider. Document Released: 02/15/2005 Document Revised: 10/29/2015 Document Reviewed: 09/12/2015 Elsevier Interactive Patient Education  2018 [place], Care After These instructions provide you with information about caring for yourself after your procedure. Your health care provider may also give you more specific instructions. Your treatment has been planned according to current medical practices, but problems sometimes occur. Call your health care provider if you have any problems or questions after your procedure. What can I expect after the procedure? After your procedure, it is common to:  Feel sleepy for several hours.  Feel clumsy and have poor balance for several hours.  Feel forgetful about what happened after the procedure.  Have poor judgment for several hours.  Feel nauseous or vomit.  Have a sore throat if you had a breathing tube during the procedure.  Follow these instructions at home: For at least 24 hours after the procedure:   Do not: ?  Participate in activities in which you could  fall or become injured. ? Drive. ? Use heavy machinery. ? Drink alcohol. ? Take sleeping pills or medicines that cause drowsiness. ? Make important decisions or sign legal documents. ? Take care of children on your own.  Rest. Eating and drinking  Follow the diet that is recommended by your health care provider.  If you vomit, drink water, juice, or soup when you can drink without vomiting.  Make sure you have little or no nausea before eating solid foods. General instructions  Have a responsible adult stay with you until you are awake and alert.  Take over-the-counter and prescription medicines only as told by your health care provider.  If you smoke, do not smoke without supervision.  Keep all follow-up visits as told by your health care provider. This is important. Contact a health care provider if:  You keep feeling nauseous or you keep vomiting.  You feel light-headed.  You develop a rash.  You have a fever. Get help right away if:  You have trouble breathing. This information is not intended to replace advice given to you by your health care provider. Make sure you discuss any questions you have with your health care provider. Document Released: 09/12/2015 Document Revised: 01/12/2016 Document Reviewed: 09/12/2015 Elsevier Interactive Patient Education  [employer].

## 2018-03-05 ENCOUNTER — Encounter (HOSPITAL_COMMUNITY)
Admission: RE | Admit: 2018-03-05 | Discharge: 2018-03-05 | Disposition: A | Discharge status: Home / Self Care | Payer: Medicare Other | Source: Ambulatory Visit | Attending: Internal Medicine | Admitting: Internal Medicine

## 2018-03-05 ENCOUNTER — Other Ambulatory Visit: Payer: Self-pay

## 2018-03-05 ENCOUNTER — Encounter (HOSPITAL_COMMUNITY): Payer: Self-pay

## 2018-03-05 DIAGNOSIS — R195 Other fecal abnormalities: Secondary | ICD-10-CM

## 2018-03-05 DIAGNOSIS — I10 Essential (primary) hypertension: Secondary | ICD-10-CM | POA: Insufficient documentation

## 2018-03-05 DIAGNOSIS — Z01810 Encounter for preprocedural cardiovascular examination: Secondary | ICD-10-CM | POA: Diagnosis present

## 2018-03-05 DIAGNOSIS — Z01812 Encounter for preprocedural laboratory examination: Secondary | ICD-10-CM | POA: Insufficient documentation

## 2018-03-05 HISTORY — DX: Dependence on renal dialysis: Z99.2

## 2018-03-05 LAB — BASIC METABOLIC PANEL
ANION GAP: 9 (ref 5–15)
BUN: 37 mg/dL — ABNORMAL HIGH (ref 8–23)
CHLORIDE: 92 mmol/L — AB (ref 98–111)
CO2: 28 mmol/L (ref 22–32)
Calcium: 8.7 mg/dL — ABNORMAL LOW (ref 8.9–10.3)
Creatinine, Ser: 4.97 mg/dL — ABNORMAL HIGH (ref 0.61–1.24)
GFR calc Af Amer: 13 mL/min — ABNORMAL LOW (ref >60)
GFR, EST NON AFRICAN AMERICAN: 11 mL/min — AB (ref >60)
Glucose, Bld: 98 mg/dL (ref 70–99)
POTASSIUM: 4.4 mmol/L (ref 3.5–5.1)
Sodium: 129 mmol/L — ABNORMAL LOW (ref 135–145)

## 2018-03-05 LAB — CBC
HCT: 34.2 % — ABNORMAL LOW (ref 39.0–52.0)
HEMOGLOBIN: 10.3 g/dL — AB (ref 13.0–17.0)
MCH: 27.5 pg (ref 26.0–34.0)
MCHC: 30.1 g/dL (ref 30.0–36.0)
MCV: 91.2 fL (ref 78.0–100.0)
PLATELETS: 145 10*3/uL — AB (ref 150–400)
RBC: 3.75 MIL/uL — AB (ref 4.22–5.81)
RDW: 15.1 % (ref 11.5–15.5)
WBC: 4.4 10*3/uL (ref 4.0–10.5)

## 2018-03-08 ENCOUNTER — Other Ambulatory Visit: Payer: Self-pay

## 2018-03-08 ENCOUNTER — Ambulatory Visit (HOSPITAL_COMMUNITY): Payer: Medicare Other | Admitting: Anesthesiology

## 2018-03-08 ENCOUNTER — Encounter (HOSPITAL_COMMUNITY)
Admission: RE | Disposition: A | Discharge status: Home / Self Care | Payer: Self-pay | Source: Ambulatory Visit | Attending: Internal Medicine

## 2018-03-08 ENCOUNTER — Ambulatory Visit (HOSPITAL_COMMUNITY)
Admission: RE | Admit: 2018-03-08 | Discharge: 2018-03-08 | Disposition: A | Discharge status: Home / Self Care | Payer: Medicare Other | Source: Ambulatory Visit | Attending: Internal Medicine | Admitting: Internal Medicine

## 2018-03-08 ENCOUNTER — Encounter (HOSPITAL_COMMUNITY): Payer: Self-pay | Admitting: Anesthesiology

## 2018-03-08 DIAGNOSIS — I2510 Atherosclerotic heart disease of native coronary artery without angina pectoris: Secondary | ICD-10-CM | POA: Insufficient documentation

## 2018-03-08 DIAGNOSIS — Z7951 Long term (current) use of inhaled steroids: Secondary | ICD-10-CM | POA: Insufficient documentation

## 2018-03-08 DIAGNOSIS — I509 Heart failure, unspecified: Secondary | ICD-10-CM | POA: Insufficient documentation

## 2018-03-08 DIAGNOSIS — Z9049 Acquired absence of other specified parts of digestive tract: Secondary | ICD-10-CM | POA: Insufficient documentation

## 2018-03-08 DIAGNOSIS — F419 Anxiety disorder, unspecified: Secondary | ICD-10-CM | POA: Diagnosis not present

## 2018-03-08 DIAGNOSIS — R634 Abnormal weight loss: Secondary | ICD-10-CM

## 2018-03-08 DIAGNOSIS — E1122 Type 2 diabetes mellitus with diabetic chronic kidney disease: Secondary | ICD-10-CM | POA: Diagnosis not present

## 2018-03-08 DIAGNOSIS — I132 Hypertensive heart and chronic kidney disease with heart failure and with stage 5 chronic kidney disease, or end stage renal disease: Secondary | ICD-10-CM | POA: Insufficient documentation

## 2018-03-08 DIAGNOSIS — M5126 Other intervertebral disc displacement, lumbar region: Secondary | ICD-10-CM | POA: Insufficient documentation

## 2018-03-08 DIAGNOSIS — Z7982 Long term (current) use of aspirin: Secondary | ICD-10-CM | POA: Insufficient documentation

## 2018-03-08 DIAGNOSIS — Z992 Dependence on renal dialysis: Secondary | ICD-10-CM | POA: Insufficient documentation

## 2018-03-08 DIAGNOSIS — K5730 Diverticulosis of large intestine without perforation or abscess without bleeding: Secondary | ICD-10-CM | POA: Insufficient documentation

## 2018-03-08 DIAGNOSIS — Z6830 Body mass index (BMI) 30.0-30.9, adult: Secondary | ICD-10-CM | POA: Diagnosis not present

## 2018-03-08 DIAGNOSIS — K2950 Unspecified chronic gastritis without bleeding: Secondary | ICD-10-CM | POA: Diagnosis not present

## 2018-03-08 DIAGNOSIS — K449 Diaphragmatic hernia without obstruction or gangrene: Secondary | ICD-10-CM

## 2018-03-08 DIAGNOSIS — E7800 Pure hypercholesterolemia, unspecified: Secondary | ICD-10-CM | POA: Insufficient documentation

## 2018-03-08 DIAGNOSIS — K222 Esophageal obstruction: Secondary | ICD-10-CM | POA: Diagnosis not present

## 2018-03-08 DIAGNOSIS — Z86010 Personal history of colonic polyps: Secondary | ICD-10-CM

## 2018-03-08 DIAGNOSIS — Z87891 Personal history of nicotine dependence: Secondary | ICD-10-CM | POA: Insufficient documentation

## 2018-03-08 DIAGNOSIS — R1032 Left lower quadrant pain: Secondary | ICD-10-CM

## 2018-03-08 DIAGNOSIS — Z800 Family history of malignant neoplasm of digestive organs: Secondary | ICD-10-CM | POA: Insufficient documentation

## 2018-03-08 DIAGNOSIS — Z79899 Other long term (current) drug therapy: Secondary | ICD-10-CM | POA: Insufficient documentation

## 2018-03-08 DIAGNOSIS — Z955 Presence of coronary angioplasty implant and graft: Secondary | ICD-10-CM | POA: Insufficient documentation

## 2018-03-08 DIAGNOSIS — K766 Portal hypertension: Secondary | ICD-10-CM | POA: Insufficient documentation

## 2018-03-08 DIAGNOSIS — K921 Melena: Secondary | ICD-10-CM | POA: Insufficient documentation

## 2018-03-08 DIAGNOSIS — K3189 Other diseases of stomach and duodenum: Secondary | ICD-10-CM

## 2018-03-08 DIAGNOSIS — K219 Gastro-esophageal reflux disease without esophagitis: Secondary | ICD-10-CM | POA: Insufficient documentation

## 2018-03-08 DIAGNOSIS — R112 Nausea with vomiting, unspecified: Secondary | ICD-10-CM | POA: Diagnosis not present

## 2018-03-08 DIAGNOSIS — G4730 Sleep apnea, unspecified: Secondary | ICD-10-CM | POA: Insufficient documentation

## 2018-03-08 DIAGNOSIS — R0600 Dyspnea, unspecified: Secondary | ICD-10-CM | POA: Insufficient documentation

## 2018-03-08 DIAGNOSIS — I252 Old myocardial infarction: Secondary | ICD-10-CM | POA: Insufficient documentation

## 2018-03-08 DIAGNOSIS — Z8042 Family history of malignant neoplasm of prostate: Secondary | ICD-10-CM | POA: Insufficient documentation

## 2018-03-08 DIAGNOSIS — K5909 Other constipation: Secondary | ICD-10-CM | POA: Insufficient documentation

## 2018-03-08 DIAGNOSIS — Z8249 Family history of ischemic heart disease and other diseases of the circulatory system: Secondary | ICD-10-CM | POA: Insufficient documentation

## 2018-03-08 DIAGNOSIS — M1990 Unspecified osteoarthritis, unspecified site: Secondary | ICD-10-CM | POA: Diagnosis not present

## 2018-03-08 DIAGNOSIS — K2980 Duodenitis without bleeding: Secondary | ICD-10-CM

## 2018-03-08 DIAGNOSIS — M797 Fibromyalgia: Secondary | ICD-10-CM | POA: Diagnosis not present

## 2018-03-08 DIAGNOSIS — N186 End stage renal disease: Secondary | ICD-10-CM | POA: Insufficient documentation

## 2018-03-08 DIAGNOSIS — K2960 Other gastritis without bleeding: Secondary | ICD-10-CM

## 2018-03-08 DIAGNOSIS — Z885 Allergy status to narcotic agent status: Secondary | ICD-10-CM | POA: Insufficient documentation

## 2018-03-08 DIAGNOSIS — R195 Other fecal abnormalities: Secondary | ICD-10-CM

## 2018-03-08 DIAGNOSIS — Z9981 Dependence on supplemental oxygen: Secondary | ICD-10-CM | POA: Insufficient documentation

## 2018-03-08 HISTORY — PX: BIOPSY: SHX5522

## 2018-03-08 HISTORY — PX: COLONOSCOPY WITH PROPOFOL: SHX5780

## 2018-03-08 LAB — POCT I-STAT 4, (NA,K, GLUC, HGB,HCT)
Glucose, Bld: 93 mg/dL (ref 70–99)
HEMATOCRIT: 34 % — AB (ref 39.0–52.0)
Hemoglobin: 11.6 g/dL — ABNORMAL LOW (ref 13.0–17.0)
Potassium: 4.1 mmol/L (ref 3.5–5.1)
SODIUM: 136 mmol/L (ref 135–145)

## 2018-03-08 SURGERY — COLONOSCOPY WITH PROPOFOL
Anesthesia: Monitor Anesthesia Care

## 2018-03-08 MED ORDER — SUCCINYLCHOLINE CHLORIDE 20 MG/ML IJ SOLN
INTRAMUSCULAR | Status: AC
  Filled 2018-03-08: qty 1

## 2018-03-08 MED ORDER — CHLORHEXIDINE GLUCONATE CLOTH 2 % EX PADS: 6.0000 | MEDICATED_PAD | Freq: Once | CUTANEOUS | Status: DC

## 2018-03-08 MED ORDER — PHENYLEPHRINE 40 MCG/ML (10ML) SYRINGE FOR IV PUSH (FOR BLOOD PRESSURE SUPPORT)
PREFILLED_SYRINGE | INTRAVENOUS | Status: AC
  Filled 2018-03-08: qty 10

## 2018-03-08 MED ORDER — LACTATED RINGERS IV SOLN: INTRAVENOUS | Status: DC

## 2018-03-08 MED ORDER — PROMETHAZINE HCL 25 MG/ML IJ SOLN: 6.2500 mg | INTRAMUSCULAR | Status: DC | PRN

## 2018-03-08 MED ORDER — LIDOCAINE VISCOUS HCL 2 % MT SOLN
OROMUCOSAL | Status: AC
  Filled 2018-03-08: qty 30

## 2018-03-08 MED ORDER — MEPERIDINE HCL 100 MG/ML IJ SOLN: 6.2500 mg | INTRAMUSCULAR | Status: DC | PRN

## 2018-03-08 MED ORDER — METOPROLOL TARTRATE 5 MG/5ML IV SOLN
INTRAVENOUS | Status: AC
  Filled 2018-03-08: qty 5

## 2018-03-08 MED ORDER — SODIUM CHLORIDE 0.9 % IV SOLN
INTRAVENOUS | Status: DC
  Administered 2018-03-08: 10:00:00 via INTRAVENOUS

## 2018-03-08 MED ORDER — PROPOFOL 10 MG/ML IV BOLUS
INTRAVENOUS | Status: DC | PRN
  Administered 2018-03-08: 10 mg via INTRAVENOUS

## 2018-03-08 MED ORDER — PROPOFOL 10 MG/ML IV BOLUS
INTRAVENOUS | Status: AC
  Filled 2018-03-08: qty 40

## 2018-03-08 MED ORDER — HYDROCODONE-ACETAMINOPHEN 7.5-325 MG PO TABS: 1.0000 | ORAL_TABLET | Freq: Once | ORAL | Status: DC | PRN

## 2018-03-08 MED ORDER — PROPOFOL 500 MG/50ML IV EMUL
INTRAVENOUS | Status: DC | PRN
  Administered 2018-03-08: 150 ug/kg/min via INTRAVENOUS

## 2018-03-08 NOTE — Op Note (Signed)
[facility] Patient Name: Mark [name] Procedure Date: 03/08/2018 10:29 AM MRN: [id number] Date of Birth: [birth year] Attending MD: Hildred Laser , MD CSN: [id number] Age: 62 Admit Type: Outpatient Procedure:                Upper GI endoscopy Indications:              Nausea with vomiting, Weight loss Providers:                Hildred Laser, MD, Otis Peak B. Sharon Seller, RN, Aram Candela Referring MD:             Chapman Fitch, MD Medicines:                Propofol per Anesthesia Complications:            No immediate complications. Estimated Blood Loss:     Estimated blood loss was minimal. Procedure:                Pre-Anesthesia Assessment:                           - Prior to the procedure, a History and Physical                            was performed, and patient medications and                            allergies were reviewed. The patient's tolerance of                            previous anesthesia was also reviewed. The risks                            and benefits of the procedure and the sedation                            options and risks were discussed with the patient.                            All questions were answered, and informed consent                            was obtained. Prior Anticoagulants: The patient                            last took aspirin 1 day prior to the procedure. ASA                            Grade Assessment: IV - A patient with severe                            systemic disease that is a constant threat to life.  After reviewing the risks and benefits, the patient                            was deemed in satisfactory condition to undergo the                            procedure.                           After obtaining informed consent, the endoscope was                            passed under direct vision. Throughout the                            procedure, the patient's blood  pressure, pulse, and                            oxygen saturations were monitored continuously. The                            GIF-H190 ([id number]) scope was introduced through the                            and advanced to the second part of duodenum. The                            upper GI endoscopy was accomplished without                            difficulty. The patient tolerated the procedure                            well. Scope In: 10:59:02 AM Scope Out: 11:10:09 AM Total Procedure Duration: 0 hours 11 minutes 7 seconds  Findings:      The examined esophagus was normal.      The Z-line was regular and was found 44 cm from the incisors.      A 2 cm hiatal hernia was present.      Mild portal hypertensive gastropathy was found in the gastric fundus and       in the gastric body.      Patchy mild inflammation characterized by adherent blood, congestion       (edema) and erythema was found in the gastric antrum and in the       prepyloric region of the stomach. Biopsies were taken with a cold       forceps for histology. The pathology specimen was placed into Bottle       Number 1.      The pylorus was normal.      Patchy mild inflammation characterized by congestion (edema), erosions       and erythema was found in the duodenal bulb. Impression:               - Normal esophagus.                           -  Z-line regular, 44 cm from the incisors.                           - 2 cm hiatal hernia.                           - Portal hypertensive gastropathy.                           - Gastritis. Biopsied.                           - Normal pylorus.                           - Duodenitis. Moderate Sedation:      Per Anesthesia Care Recommendation:           - Patient has a contact number available for                            emergencies. The signs and symptoms of potential                            delayed complications were discussed with the                             patient. Return to normal activities tomorrow.                            Written discharge instructions were provided to the                            patient.                           - Resume previous diet today.                           - Continue present medications.                           - Await pathology results.                           - See the other procedure note for documentation of                            additional recommendations. Procedure Code(s):        --- Professional ---                           [phone], Esophagogastroduodenoscopy, flexible,                            transoral; with biopsy, single or multiple Diagnosis Code(s):        --- Professional ---  K44.9, Diaphragmatic hernia without obstruction or                            gangrene                           K76.6, Portal hypertension                           K31.89, Other diseases of stomach and duodenum                           K29.70, Gastritis, unspecified, without bleeding                           K29.80, Duodenitis without bleeding                           R11.2, Nausea with vomiting, unspecified                           R63.4, Abnormal weight loss CPT copyright 2017 American [employer] Association. All rights reserved. The codes documented in this report are preliminary and upon coder review may  be revised to meet current compliance requirements. Hildred Laser, MD Hildred Laser, MD 03/08/2018 11:44:29 AM This report has been signed electronically. Number of Addenda: 0

## 2018-03-08 NOTE — Transfer of Care (Signed)
Immediate Anesthesia Transfer of Care Note  Patient: Mark [name]  Procedure(s) Performed: COLONOSCOPY WITH PROPOFOL (N/A ) BIOPSY  Patient Location: PACU  Anesthesia Type:MAC  Level of Consciousness: awake  Airway & Oxygen Therapy: Patient Spontanous Breathing  Post-op Assessment: Report given to RN  Post vital signs: Reviewed and stable  Last Vitals:  Vitals Value Taken Time  BP 162/64 03/08/2018 11:40 AM  Temp    Pulse 43 03/08/2018 11:40 AM  Resp 15 03/08/2018 11:41 AM  SpO2 80 % 03/08/2018 11:40 AM  Vitals shown include unvalidated device data.  Last Pain:  Vitals:   03/08/18 1050  TempSrc:   PainSc: 7       Patients Stated Pain Goal: 7 (88/82/80 0349)  Complications: No apparent anesthesia complications

## 2018-03-08 NOTE — Op Note (Signed)
[facility] Patient Name: Mark [name] Procedure Date: 03/08/2018 10:24 AM MRN: [id number] Date of Birth: 1956-01-13 Attending MD: Hildred Laser , MD CSN: [id number] Age: 62 Admit Type: Outpatient Procedure:                Colonoscopy Indications:              Positive Cologuard test Providers:                Hildred Laser, MD, Jeanann Lewandowsky. Sharon Seller, RN, Aram Candela Referring MD:             Chapman Fitch, MD Medicines:                Propofol per Anesthesia Complications:            No immediate complications. Estimated Blood Loss:     Estimated blood loss: none. Procedure:                After obtaining informed consent, the colonoscope                            was passed under direct vision. Throughout the                            procedure, the patient's blood pressure, pulse, and                            oxygen saturations were monitored continuously. The                            CF-HQ190L ([id number]) scope was introduced through                            the and advanced to the the cecum. The colonoscopy                            was technically difficult and complex due to poor                            bowel prep with stool present. The patient                            tolerated the procedure well. The quality of the                            bowel preparation was poor. The rectum was                            photographed. Scope In: 11:14:47 AM Scope Out: 11:31:34 AM Scope Withdrawal Time: 0 hours 3 minutes 53 seconds  Total Procedure Duration: 0 hours 16 minutes 47 seconds  Findings:      The perianal and digital rectal examinations were normal.      A few small-mouthed diverticula were found in the sigmoid colon.  The retroflexed view of the distal rectum and anal verge was normal and       showed no anal or rectal abnormalities. Impression:               - Preparation of the colon was poor.                           -  Diverticulosis in the sigmoid colon.                           - The distal rectum and anal verge are normal on                            retroflexion view.                           - No specimens collected. Moderate Sedation:      Per Anesthesia Care Recommendation:           - Patient has a contact number available for                            emergencies. The signs and symptoms of potential                            delayed complications were discussed with the                            patient. Return to normal activities tomorrow.                            Written discharge instructions were provided to the                            patient.                           - Resume previous diet today.                           - Continue present medications.                           - See the other procedure note for documentation of                            additional recommendations.                           - No recommendation at this time regarding repeat                            colonoscopy at this time. Procedure Code(s):        --- Professional ---                           [phone], Colonoscopy, flexible; diagnostic, including  collection of specimen(s) by brushing or washing,                            when performed (separate procedure) Diagnosis Code(s):        --- Professional ---                           R19.5, Other fecal abnormalities                           K57.30, Diverticulosis of large intestine without                            perforation or abscess without bleeding CPT copyright 2017 American [employer]. All rights reserved. The codes documented in this report are preliminary and upon coder review may  be revised to meet current compliance requirements. Hildred Laser, MD Hildred Laser, MD 03/08/2018 11:48:36 AM This report has been signed electronically. Number of Addenda: 0

## 2018-03-08 NOTE — Discharge Instructions (Signed)
Resume resume aspirin on 03/09/2018. Resume other medications as before. Resume usual diet. No driving for 24 hours. Physician will call with biopsy results and further recommendations.      Esophagogastroduodenoscopy, Care After Refer to this sheet in the next few weeks. These instructions provide you with information about caring for yourself after your procedure. Your health care provider may also give you more specific instructions. Your treatment has been planned according to current medical practices, but problems sometimes occur. Call your health care provider if you have any problems or questions after your procedure. What can I expect after the procedure? After the procedure, it is common to have:  A sore throat.  Nausea.  Bloating.  Dizziness.  Fatigue.  Follow these instructions at home:  Do not eat or drink anything until the numbing medicine (local anesthetic) has worn off and your gag reflex has returned. You will know that the local anesthetic has worn off when you can swallow comfortably.  Do not drive for 24 hours if you received a medicine to help you relax (sedative).  If your health care provider took a tissue sample for testing during the procedure, make sure to get your test results. This is your responsibility. Ask your health care provider or the department performing the test when your results will be ready.  Keep all follow-up visits as told by your health care provider. This is important. Contact a health care provider if:  You cannot stop coughing.  You are not urinating.  You are urinating less than usual. Get help right away if:  You have trouble swallowing.  You cannot eat or drink.  You have throat or chest pain that gets worse.  You are dizzy or light-headed.  You faint.  You have nausea or vomiting.  You have chills.  You have a fever.  You have severe abdominal pain.  You have black, tarry, or bloody stools. This  information is not intended to replace advice given to you by your health care provider. Make sure you discuss any questions you have with your health care provider. Document Released: 05/08/2012 Document Revised: 10/28/2015 Document Reviewed: 04/15/2015 Elsevier Interactive Patient Education  2018 [employer].     Colonoscopy, Adult, Care After This sheet gives you information about how to care for yourself after your procedure. Your doctor may also give you more specific instructions. If you have problems or questions, call your doctor. Follow these instructions at home: General instructions   For the first 24 hours after the procedure: ? Do not drive or use machinery. ? Do not sign important documents. ? Do not drink alcohol. ? Do your daily activities more slowly than normal. ? Eat foods that are soft and easy to digest. ? Rest often.  Take over-the-counter or prescription medicines only as told by your doctor.  It is up to you to get the results of your procedure. Ask your doctor, or the department performing the procedure, when your results will be ready. To help cramping and bloating:  Try walking around.  Put heat on your belly (abdomen) as told by your doctor. Use a heat source that your doctor recommends, such as a moist heat pack or a heating pad. ? Put a towel between your skin and the heat source. ? Leave the heat on for 20-30 minutes. ? Remove the heat if your skin turns bright red. This is especially important if you cannot feel pain, heat, or cold. You can get burned. Eating and drinking  Drink enough fluid to keep your pee (urine) clear or pale yellow.  Return to your normal diet as told by your doctor. Avoid heavy or fried foods that are hard to digest.  Avoid drinking alcohol for as long as told by your doctor. Contact a doctor if:  You have blood in your poop (stool) 2-3 days after the procedure. Get help right away if:  You have more than a small  amount of blood in your poop.  You see large clumps of tissue (blood clots) in your poop.  Your belly is swollen.  You feel sick to your stomach (nauseous).  You throw up (vomit).  You have a fever.  You have belly pain that gets worse, and medicine does not help your pain. This information is not intended to replace advice given to you by your health care provider. Make sure you discuss any questions you have with your health care provider. Document Released: 06/24/2010 Document Revised: 02/14/2016 Document Reviewed: 02/14/2016 Elsevier Interactive Patient Education  2017 [employer] Inc[employer]     Hiatal Hernia A hiatal hernia occurs when part of the stomach slides above the muscle that separates the abdomen from the chest (diaphragm). A person can be born with a hiatal hernia (congenital), or it may develop over time. In almost all cases of hiatal hernia, only the top part of the stomach pushes through the diaphragm. Many people have a hiatal hernia with no symptoms. The larger the hernia, the more likely it is that you will have symptoms. In some cases, a hiatal hernia allows stomach acid to flow back into the tube that carries food from your mouth to your stomach (esophagus). This may cause heartburn symptoms. Severe heartburn symptoms may mean that you have developed a condition called gastroesophageal reflux disease (GERD). What are the causes? This condition is caused by a weakness in the opening (hiatus) where the esophagus passes through the diaphragm to attach to the upper part of the stomach. A person may be born with a weakness in the hiatus, or a weakness can develop over time. What increases the risk? This condition is more likely to develop in:  Older people. Age is a major risk factor for a hiatal hernia, especially if you are over the age of 67.  Pregnant women.  People who are overweight.  People who have frequent constipation.  What are the signs or symptoms? Symptoms  of this condition usually develop in the form of GERD symptoms. Symptoms include:  Heartburn.  Belching.  Indigestion.  Trouble swallowing.  Coughing or wheezing.  Sore throat.  Hoarseness.  Chest pain.  Nausea and vomiting.  How is this diagnosed? This condition may be diagnosed during testing for GERD. Tests that may be done include:  X-rays of your stomach or chest.  An upper gastrointestinal (GI) series. This is an X-ray exam of your GI tract that is taken after you swallow a chalky liquid that shows up clearly on the X-ray.  Endoscopy. This is a procedure to look into your stomach using a thin, flexible tube that has a tiny camera and light on the end of it.  How is this treated? This condition may be treated by:  Dietary and lifestyle changes to help reduce GERD symptoms.  Medicines. These may include: ? Over-the-counter antacids. ? Medicines that make your stomach empty more quickly. ? Medicines that block the production of stomach acid (H2 blockers). ? Stronger medicines to reduce stomach acid (proton pump inhibitors).  Surgery to repair  the hernia, if other treatments are not helping.  If you have no symptoms, you may not need treatment. Follow these instructions at home: Lifestyle and activity  Do not use any products that contain nicotine or tobacco, such as cigarettes and e-cigarettes. If you need help quitting, ask your health care provider.  Try to achieve and maintain a healthy body weight.  Avoid putting pressure on your abdomen. Anything that puts pressure on your abdomen increases the amount of acid that may be pushed up into your esophagus. ? Avoid bending over, especially after eating. ? Raise the head of your bed by putting blocks under the legs. This keeps your head and esophagus higher than your stomach. ? Do not wear tight clothing around your chest or stomach. ? Try not to strain when having a bowel movement, when urinating, or when  lifting heavy objects. Eating and drinking  Avoid foods that can worsen GERD symptoms. These may include: ? Fatty foods, like fried foods. ? Citrus fruits, like oranges or lemon. ? Other foods and drinks that contain acid, like orange juice or tomatoes. ? Spicy food. ? Chocolate.  Eat frequent small meals instead of three large meals a day. This helps prevent your stomach from getting too full. ? Eat slowly. ? Do not lie down right after eating. ? Do not eat 1-2 hours before bed.  Do not drink beverages with caffeine. These include cola, coffee, cocoa, and tea.  Do not drink alcohol. General instructions  Take over-the-counter and prescription medicines only as told by your health care provider.  Keep all follow-up visits as told by your health care provider. This is important. Contact a health care provider if:  Your symptoms are not controlled with medicines or lifestyle changes.  You are having trouble swallowing.  You have coughing or wheezing that will not go away. Get help right away if:  Your pain is getting worse.  Your pain spreads to your arms, neck, jaw, teeth, or back.  You have shortness of breath.  You sweat for no reason.  You feel sick to your stomach (nauseous) or you vomit.  You vomit blood.  You have bright red blood in your stools.  You have black, tarry stools. This information is not intended to replace advice given to you by your health care provider. Make sure you discuss any questions you have with your health care provider. Document Released: 08/12/2003 Document Revised: 05/15/2016 Document Reviewed: 05/15/2016 Elsevier Interactive Patient Education  2018 Elsevier Inc.    Gastritis, Adult Gastritis is swelling (inflammation) of the stomach. When you have this condition, you can have these problems (symptoms):  Pain in your stomach.  A burning feeling in your stomach.  Feeling sick to your stomach (nauseous).  Throwing up  (vomiting).  Feeling too full after you eat.  It is important to get help for this condition. Without help, your stomach can bleed, and you can get sores (ulcers) in your stomach. Follow these instructions at home:  Take over-the-counter and prescription medicines only as told by your doctor.  If you were prescribed an antibiotic medicine, take it as told by your doctor. Do not stop taking it even if you start to feel better.  Drink enough fluid to keep your pee (urine) clear or pale yellow.  Instead of eating big meals, eat small meals often. Contact a health care provider if:  Your problems get worse.  Your problems go away and then come back. Get help right away if:  You throw up blood or something that looks like coffee grounds.  You have black or dark red poop (stools).  You cannot keep fluids down.  Your stomach pain gets worse.  You have a fever.  You do not feel better after 1 week. This information is not intended to replace advice given to you by your health care provider. Make sure you discuss any questions you have with your health care provider. Document Released: 11/08/2007 Document Revised: 01/19/2016 Document Reviewed: 02/13/2015 Elsevier Interactive Patient Education  2018 [employer].    Diverticulosis Diverticulosis is a condition that develops when small pouches (diverticula) form in the wall of the large intestine (colon). The colon is where water is absorbed and stool is formed. The pouches form when the inside layer of the colon pushes through weak spots in the outer layers of the colon. You may have a few pouches or many of them. What are the causes? The cause of this condition is not known. What increases the risk? The following factors may make you more likely to develop this condition:  Being older than age 67. Your risk for this condition increases with age. Diverticulosis is rare among people younger than age 42. By age 54, many people have  it.  Eating a low-fiber diet.  Having frequent constipation.  Being overweight.  Not getting enough exercise.  Smoking.  Taking over-the-counter pain medicines, like aspirin and ibuprofen.  Having a family history of diverticulosis.  What are the signs or symptoms? In most people, there are no symptoms of this condition. If you do have symptoms, they may include:  Bloating.  Cramps in the abdomen.  Constipation or diarrhea.  Pain in the lower left side of the abdomen.  How is this diagnosed? This condition is most often diagnosed during an exam for other colon problems. Because diverticulosis usually has no symptoms, it often cannot be diagnosed independently. This condition may be diagnosed by:  Using a flexible scope to examine the colon (colonoscopy).  Taking an X-ray of the colon after dye has been put into the colon (barium enema).  Doing a CT scan.  How is this treated? You may not need treatment for this condition if you have never developed an infection related to diverticulosis. If you have had an infection before, treatment may include:  Eating a high-fiber diet. This may include eating more fruits, vegetables, and grains.  Taking a fiber supplement.  Taking a live bacteria supplement (probiotic).  Taking medicine to relax your colon.  Taking antibiotic medicines.  Follow these instructions at home:  Drink 6-8 glasses of water or more each day to prevent constipation.  Try not to strain when you have a bowel movement.  If you have had an infection before: ? Eat more fiber as directed by your health care provider or your diet and nutrition specialist (dietitian). ? Take a fiber supplement or probiotic, if your health care provider approves.  Take over-the-counter and prescription medicines only as told by your health care provider.  If you were prescribed an antibiotic, take it as told by your health care provider. Do not stop taking the  antibiotic even if you start to feel better.  Keep all follow-up visits as told by your health care provider. This is important. Contact a health care provider if:  You have pain in your abdomen.  You have bloating.  You have cramps.  You have not had a bowel movement in 3 days. Get help right away if:  Your pain gets worse.  Your bloating becomes very bad.  You have a fever or chills, and your symptoms suddenly get worse.  You vomit.  You have bowel movements that are bloody or black.  You have bleeding from your rectum. Summary  Diverticulosis is a condition that develops when small pouches (diverticula) form in the wall of the large intestine (colon).  You may have a few pouches or many of them.  This condition is most often diagnosed during an exam for other colon problems.  If you have had an infection related to diverticulosis, treatment may include increasing the fiber in your diet, taking supplements, or taking medicines. This information is not intended to replace advice given to you by your health care provider. Make sure you discuss any questions you have with your health care provider. Document Released: 02/17/2004 Document Revised: 04/10/2016 Document Reviewed: 04/10/2016 Elsevier Interactive Patient Education  2017 [place], Care After These instructions provide you with information about caring for yourself after your procedure. Your health care provider may also give you more specific instructions. Your treatment has been planned according to current medical practices, but problems sometimes occur. Call your health care provider if you have any problems or questions after your procedure. What can I expect after the procedure? After your procedure, it is common to:  Feel sleepy for several hours.  Feel clumsy and have poor balance for several hours.  Feel forgetful about what happened after the procedure.  Have poor  judgment for several hours.  Feel nauseous or vomit.  Have a sore throat if you had a breathing tube during the procedure.  Follow these instructions at home: For at least 24 hours after the procedure:   Do not: ? Participate in activities in which you could fall or become injured. ? Drive. ? Use heavy machinery. ? Drink alcohol. ? Take sleeping pills or medicines that cause drowsiness. ? Make important decisions or sign legal documents. ? Take care of children on your own.  Rest. Eating and drinking  Follow the diet that is recommended by your health care provider.  If you vomit, drink water, juice, or soup when you can drink without vomiting.  Make sure you have little or no nausea before eating solid foods. General instructions  Have a responsible adult stay with you until you are awake and alert.  Take over-the-counter and prescription medicines only as told by your health care provider.  If you smoke, do not smoke without supervision.  Keep all follow-up visits as told by your health care provider. This is important. Contact a health care provider if:  You keep feeling nauseous or you keep vomiting.  You feel light-headed.  You develop a rash.  You have a fever. Get help right away if:  You have trouble breathing. This information is not intended to replace advice given to you by your health care provider. Make sure you discuss any questions you have with your health care provider. Document Released: 09/12/2015 Document Revised: 01/12/2016 Document Reviewed: 09/12/2015 Elsevier Interactive Patient Education  [employer].

## 2018-03-08 NOTE — Anesthesia Postprocedure Evaluation (Signed)
Anesthesia Post Note  Patient: Mark [name]  Procedure(s) Performed: COLONOSCOPY WITH PROPOFOL (N/A ) BIOPSY  Patient location during evaluation: PACU Anesthesia Type: MAC Level of consciousness: awake and alert and oriented Pain management: pain level controlled Vital Signs Assessment: post-procedure vital signs reviewed and stable Respiratory status: spontaneous breathing Cardiovascular status: blood pressure returned to baseline and stable Postop Assessment: adequate PO intake Anesthetic complications: no     Last Vitals:  Vitals:   03/08/18 0917  BP: (!) 153/67  Pulse: 78  Resp: 20  Temp: 36.4 C  SpO2: 94%    Last Pain:  Vitals:   03/08/18 1050  TempSrc:   PainSc: 7                  Saifullah Jolley

## 2018-03-08 NOTE — Anesthesia Preprocedure Evaluation (Signed)
Anesthesia Evaluation  Patient identified by MRN, date of birth, ID band Patient awake    Reviewed: Allergy & Precautions, H&P , NPO status , Patient's Chart, lab work & pertinent test results, reviewed documented beta blocker date and time   Airway Mallampati: II  TM Distance: >3 FB Neck ROM: full    Dental no notable dental hx. (+) Teeth Intact, Dental Advidsory Given   Pulmonary neg pulmonary ROS, shortness of breath and with exertion, sleep apnea , resolved, former smoker,    Pulmonary exam normal breath sounds clear to auscultation       Cardiovascular Exercise Tolerance: Good hypertension, On Medications + CAD, + Past MI and +CHF  negative cardio ROS   Rhythm:regular Rate:Normal     Neuro/Psych Anxiety  Neuromuscular disease negative neurological ROS  negative psych ROS   GI/Hepatic negative GI ROS, Neg liver ROS, GERD  Medicated,  Endo/Other  negative endocrine ROSdiabetes, Type 2  Renal/GU ESRF and Dialysisnegative Renal ROS  negative genitourinary   Musculoskeletal   Abdominal   Peds  Hematology negative hematology ROS (+)   Anesthesia Other Findings Oxygen dependent since Pneumonia on COPD 05/2017 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 I-Stat labs reviewed  Reproductive/Obstetrics negative OB ROS                             Anesthesia Physical Anesthesia Plan  ASA: IV  Anesthesia Plan: MAC   Post-op Pain Management:    Induction:   PONV Risk Score and Plan:   Airway Management Planned:   Additional Equipment:   Intra-op Plan:   Post-operative Plan:   Informed Consent: I have reviewed the patients History and Physical, chart, labs and discussed the procedure including the risks, benefits and alternatives for the proposed anesthesia with the patient or authorized representative who has indicated his/her understanding and acceptance.   Dental Advisory Given  Plan  Discussed with: CRNA and Anesthesiologist  Anesthesia Plan Comments:         Anesthesia Quick Evaluation

## 2018-03-08 NOTE — H&P (Signed)
Mark [name] is an 62 y.o. male.   Chief Complaint: Patient is here for colonoscopy but he would also undergo esophagogastroduodenoscopy. HPI: Patient is 62 year old Caucasian male with multiple medical problems including end-stage renal disease who is on dialysis who was found to have positive Cologuard test.  He has chronic constipation.  He states he has not passed any blood per rectum but he did report episode of rectal bleeding to Ms. Setzer NP when he was seen in the office about a month ago.  He also complains of chronic left lower quadrant abdominal pain.  He is not sure if pain eases with defecation.  He uses polyethylene glycol usually every other day in addition to stool softeners.  He states he has nausea and vomiting at least 3-4 times a week if not daily.  He takes antibiotic quite often.  Vomiting usually occurs postprandially.  He has poor appetite.  His wife states he has lost at least 30 pounds this year. Family history is positive for CRC maternal grandfather.  He was 80 at the time of diagnosis.  Past Medical History:  Diagnosis Date  . Anxiety    occas. panic attack, takes xanax occas  . Arthritis    herniated disc, lumbar   . Back pain   . CAD (coronary artery disease)    03/25/2015 95% prox to mid LAD treated with Promus Premier DES 3.5x34mm postdilated to 4.30mm, 100% prox to distal RCA lesion filled in the PDA region via collaterals from LAD and PLA  . Cancer ([place])    - skin ca on face- removed   . CHF (congestive heart failure) ([place])   . Chronic kidney disease   . Complication of anesthesia    " it takes a long time to get over it."   . Diabetes mellitus without complication ([place])    type 2  . Dialysis patient ([place])   . Fibromyalgia   . GERD (gastroesophageal reflux disease)    otc- pepcid , approx. every other  day    . H/O acute respiratory failure   . Hematuria    being followed by Dr. Hinda Lenis for decreased kidney function   . History of blood product  transfusion 02/13/2014   After having back surgery  . Hypercholesteremia   . Hypertension   . Myocardial infarction ([place]) 03/2015  . Pneumonia    "walking"  . Shortness of breath dyspnea   . Sleep apnea    test aborted, due to not able to relax , since the aborted test he had surgery for gallbladder & he reports that he was told that he has sleep apnea     Past Surgical History:  Procedure Laterality Date  . A/V FISTULAGRAM Left 10/31/2016   Procedure: A/V Fistulagram;  Surgeon: Serafina Mitchell, MD;  Location: [place] [facility];  Service: Cardiovascular;  Laterality: Left;  . AV FISTULA PLACEMENT Left 02/02/2015   Procedure: LEFT ARM RADIOCEPHALIC ARTERIOVENOUS (AV) FISTULA CREATION;  Surgeon: Conrad M[place]in[place], MD;  Location: [place];  Service: Vascular;  Laterality: Left;  . AV FISTULA PLACEMENT Left 05/25/2016   Procedure: ARTERIOVENOUS (AV) FISTULA CREATION;  Surgeon: [name], MD;  [facility];  Service: Vascular;  Laterality: Left;  . BACK SURGERY  02/13/14  . CARDIAC CATHETERIZATION N/A 03/25/2015   Procedure: Left Heart Cath and Coronary Angiography;  Surgeon: Leonie Man, MD;  Location: [place] [facility];  Service: Cardiovascular;  Laterality: N/A;  . CARDIAC CATHETERIZATION N/A 03/25/2015  Procedure: Coronary Stent Intervention;  Surgeon: Leonie Man, MD;  Location: [place] [facility];  Service: Cardiovascular;  Laterality: N/A;  . CHOLECYSTECTOMY    . COLONOSCOPY    . EYE SURGERY     cataracts remove, bilateral, w/IOL.  Marland Kitchen INSERTION OF DIALYSIS CATHETER N/A 03/09/2015   Procedure: INSERTION OF DIALYSIS CATHETER;  Surgeon: Conrad Bathgate, MD;  Location: [place];  Service: Vascular;  Laterality: N/A;  . INSERTION OF DIALYSIS CATHETER Left 04/07/2016   Procedure: INSERTION OF TUNNELED DIALYSIS CATHETER LEFT INTERNAL JUGULAR, ATTEMPTED INSERTION OF TUNNELLED CATHETER RIGHT INTERNAL JUGULAR;  Surgeon: [name], MD;  [facility];  Service: Vascular;   Laterality: Left;  . IR GENERIC HISTORICAL  07/28/2016   IR FLUORO GUIDE CV LINE LEFT 07/28/2016 Greggory Keen, MD [facility]  . IR REMOVAL TUN CV CATH W/O FL  03/28/2017  . LUMBAR LAMINECTOMY/DECOMPRESSION MICRODISCECTOMY Right 02/13/2014   Procedure: RIGHT LUMBAR FOUR-FIVE, RIGHT LUMBAR FIVE- SACRAL ONE LUMBAR LAMINECTOMY/DECOMPRESSION MICRODISCECTOMY ;  Surgeon: Ashok Pall, MD;  Location: [place] [facility];  Service: Neurosurgery;  Laterality: Right;  Right L4-5 and Right L5-S1 diskectomies  . PERIPHERAL VASCULAR BALLOON ANGIOPLASTY Left 10/31/2016   Procedure: Peripheral Vascular Balloon Angioplasty;  Surgeon: Serafina Mitchell, MD;  Location: [place] [facility];  Service: Cardiovascular;  Laterality: Left;  AV fistula  . TEE WITHOUT CARDIOVERSION N/A 05/02/2016   Procedure: TRANSESOPHAGEAL ECHOCARDIOGRAM (TEE);  Surgeon: Herminio Commons, MD;  [facility];  Service: Cardiovascular;  Laterality: N/A;  . TONSILLECTOMY      Family History  Adopted: Yes  Problem Relation Age of Onset  . Prostate cancer Father   . Cancer Father        prostate  . Heart failure Maternal Grandmother   . Heart attack Maternal Grandfather   . Colon cancer Maternal Grandfather    Social History:  reports that he quit smoking about 3 years ago. His smoking use included cigarettes. He started smoking about 44 years ago. He has a 20.00 pack-year smoking history. He has never used smokeless tobacco. He reports that he does not drink alcohol or use drugs.  Allergies:  Allergies  Allergen Reactions  . Morphine And Related     Upset stomach     Medications Prior to Admission  Medication Sig Dispense Refill  . albuterol (PROAIR HFA) 108 (90 Base) MCG/ACT inhaler Inhale 2 puffs into the lungs every 4 (four) hours as needed.    Marland Kitchen amLODipine (NORVASC) 10 MG tablet Take 10 mg by mouth daily.    Marland Kitchen aspirin EC 81 MG tablet Take 81 mg by mouth daily.    Marland Kitchen atorvastatin (LIPITOR) 40 MG tablet Take 1 tablet  (40 mg total) by mouth daily. 90 tablet 3  . Buprenorphine HCl (BELBUCA) 750 MCG FILM Place 750 mcg inside cheek 2 (two) times daily.    . cyclobenzaprine (FLEXERIL) 5 MG tablet Take 5 mg by mouth 2 (two) times daily.    Marland Kitchen docusate sodium (COLACE) 100 MG capsule Take 100 mg by mouth daily.    . DULoxetine (CYMBALTA) 30 MG capsule Take 30 mg by mouth daily.    . fluticasone (FLONASE) 50 MCG/ACT nasal spray Place 2 sprays into both nostrils daily.    . hydrALAZINE (APRESOLINE) 50 MG tablet TAKE 1 AND 1/2 TABLETS BY MOUTH THREE TIMES DAILY (Patient taking differently: Take 75 mg by mouth 3 (three) times daily. TAKE 1 AND 1/2 TABLETS BY MOUTH THREE TIMES DAILY) 135  tablet 6  . ondansetron (ZOFRAN) 4 MG tablet Take 4 mg by mouth every 8 (eight) hours as needed for nausea or vomiting.    . OXYGEN Inhale 2 L into the lungs continuous.     . ranitidine (ZANTAC) 150 MG capsule Take 150 mg by mouth daily.    . traZODone (DESYREL) 50 MG tablet Take 50 mg by mouth at bedtime.     . nitroGLYCERIN (NITROSTAT) 0.4 MG SL tablet Place 1 tablet (0.4 mg total) under the tongue every 5 (five) minutes as needed. (Patient taking differently: Place 0.4 mg under the tongue every 5 (five) minutes as needed for chest pain. ) 25 tablet 3    Results for orders placed or performed during the hospital encounter of 03/08/18 (from the past 48 hour(s))  I-STAT 4, (NA,K, GLUC, HGB,HCT)     Status: Abnormal   Collection Time: 03/08/18  9:18 AM  Result Value Ref Range   Sodium 136 135 - 145 mmol/L   Potassium 4.1 3.5 - 5.1 mmol/L   Glucose, Bld 93 70 - 99 mg/dL   HCT 34.0 (L) 39.0 - 52.0 %   Hemoglobin 11.6 (L) 13.0 - 17.0 g/dL   No results found.  ROS  Blood pressure (!) 153/67, pulse 78, temperature 97.6 F (36.4 C), temperature source Oral, resp. rate 20, height 6' (1.829 m), weight 102.1 kg, SpO2 94 %. Physical Exam  Constitutional: He appears well-developed and well-nourished.  HENT:  Mouth/Throat: Oropharynx is  clear and moist.  Eyes: Conjunctivae are normal. No scleral icterus.  Neck: No thyromegaly present.  Cardiovascular: Normal rate, regular rhythm and normal heart sounds.  No murmur heard. Respiratory: Effort normal and breath sounds normal.  GI:  Abdomen is full.  Increased pigmentation noted to skin in left lower quadrant.  There is fullness and mild tenderness in this area.  No definite mass palpable.  No hepatosplenomegaly.  Musculoskeletal: He exhibits no edema.  Lymphadenopathy:    He has no cervical adenopathy.  Neurological: He is alert.  Skin: Skin is warm and dry.     Assessment/Plan Chronic nausea and vomiting associated with weight loss.  He possibly has gastroparesis but other conditions need to be ruled out before proceeding with motility study. Positive Cologuard test.  History of colonic adenomas. Diagnostic EGD and colonoscopy.  Hildred Laser, MD 03/08/2018, 10:32 AM

## 2018-03-13 ENCOUNTER — Encounter (HOSPITAL_COMMUNITY): Payer: Self-pay | Admitting: Internal Medicine

## 2018-03-14 ENCOUNTER — Telehealth (INDEPENDENT_AMBULATORY_CARE_PROVIDER_SITE_OTHER): Payer: Self-pay | Admitting: *Deleted

## 2018-03-14 NOTE — Telephone Encounter (Signed)
Patient states that he had TCS /EGD October 4,2019. He is complaining of Constipation since that time. He has doubled the Ducolax, stool softners and the Miralax. Says that he is eating and drinking. He would like to know what he can do.

## 2018-03-14 NOTE — Telephone Encounter (Signed)
Patient's contact number is [phone]

## 2018-03-19 ENCOUNTER — Telehealth (INDEPENDENT_AMBULATORY_CARE_PROVIDER_SITE_OTHER): Payer: Self-pay | Admitting: *Deleted

## 2018-03-19 NOTE — Telephone Encounter (Signed)
Forwarded to Suella Grove , please see the previous note.

## 2018-03-19 NOTE — Telephone Encounter (Signed)
Mark [name], He can try Mag Citrate and see if this works.

## 2018-03-19 NOTE — Telephone Encounter (Signed)
Patient recently had a TCS, he has had Constipation every since. He has taken double doses of Doculax , stools softners , miralax. He is not having success.  He would like to know what he might could take to help him. [phone].  From the note post procedure.  Preparation of the colon was poor. - Diverticulosis in the sigmoid colon. - The distal rectum and anal verge are normal on retroflexion view. - No specimens collected.  EGD    - Normal esophagus. - Z-line regular, 44 cm from the incisors. - 2 cm hiatal hernia. - Portal hypertensive gastropathy. - Gastritis. Biopsied. - Normal pylorus. - Duodenitis.  Forwarded to Lelon Perla as Dr.Rehman is off campus for 2 weeks.

## 2018-03-19 NOTE — Telephone Encounter (Signed)
Patient was called and made aware, He states that on Saturday night he took the Mag Citrate and it helped him to go to the bathroom.

## 2018-03-28 ENCOUNTER — Other Ambulatory Visit: Payer: Self-pay | Admitting: Cardiovascular Disease

## 2018-04-12 ENCOUNTER — Other Ambulatory Visit: Payer: Self-pay | Admitting: Cardiovascular Disease

## 2018-05-07 ENCOUNTER — Encounter (INDEPENDENT_AMBULATORY_CARE_PROVIDER_SITE_OTHER): Payer: Self-pay | Admitting: *Deleted

## 2018-05-08 ENCOUNTER — Other Ambulatory Visit: Payer: Self-pay | Admitting: *Deleted

## 2018-05-08 MED ORDER — HYDRALAZINE HCL 50 MG PO TABS: ORAL_TABLET | ORAL | 1 refills | Status: DC

## 2018-06-13 ENCOUNTER — Other Ambulatory Visit: Payer: Self-pay | Admitting: Cardiovascular Disease

## 2018-08-13 ENCOUNTER — Ambulatory Visit (INDEPENDENT_AMBULATORY_CARE_PROVIDER_SITE_OTHER): Payer: Medicare Other | Admitting: Cardiovascular Disease

## 2018-08-13 ENCOUNTER — Encounter

## 2018-08-13 ENCOUNTER — Encounter: Payer: Self-pay | Admitting: Cardiovascular Disease

## 2018-08-13 VITALS — BP 140/72 | HR 79 | Ht 71.0 in | Wt 226.0 lb

## 2018-08-13 DIAGNOSIS — I25118 Atherosclerotic heart disease of native coronary artery with other forms of angina pectoris: Secondary | ICD-10-CM | POA: Diagnosis not present

## 2018-08-13 DIAGNOSIS — I10 Essential (primary) hypertension: Secondary | ICD-10-CM

## 2018-08-13 DIAGNOSIS — Z01818 Encounter for other preprocedural examination: Secondary | ICD-10-CM | POA: Diagnosis not present

## 2018-08-13 DIAGNOSIS — Z992 Dependence on renal dialysis: Secondary | ICD-10-CM

## 2018-08-13 DIAGNOSIS — E785 Hyperlipidemia, unspecified: Secondary | ICD-10-CM

## 2018-08-13 DIAGNOSIS — I5022 Chronic systolic (congestive) heart failure: Secondary | ICD-10-CM | POA: Diagnosis not present

## 2018-08-13 DIAGNOSIS — Z955 Presence of coronary angioplasty implant and graft: Secondary | ICD-10-CM | POA: Diagnosis not present

## 2018-08-13 DIAGNOSIS — N186 End stage renal disease: Secondary | ICD-10-CM

## 2018-08-13 NOTE — Patient Instructions (Signed)
Medication Instructions:  Your physician recommends that you continue on your current medications as directed. Please refer to the Current Medication list given to you today.  If you need a refill on your cardiac medications before your next appointment, please call your pharmacy.   Lab work: None  today If you have labs (blood work) drawn today and your tests are completely normal, you will receive your results only by: Marland Kitchen MyChart Message (if you have MyChart) OR . A paper copy in the mail If you have any lab test that is abnormal or we need to change your treatment, we will call you to review the results.  Testing/Procedures: Your physician has requested that you have an echocardiogram. Echocardiography is a painless test that uses sound waves to create images of your heart. It provides your doctor with information about the size and shape of your heart and how well your heart's chambers and valves are working. This procedure takes approximately one hour. There are no restrictions for this procedure.  Your physician has requested that you have a lexiscan myoview. For further information please visit [email or web address]. Please follow instruction sheet, as given.    Follow-Up: At [facility], you and your health needs are our priority.  As part of our continuing mission to provide you with exceptional heart care, we have created designated Provider Care Teams.  These Care Teams include your primary Cardiologist (physician) and Advanced Practice Providers (APPs -  Physician Assistants and Nurse Practitioners) who all work together to provide you with the care you need, when you need it. You will need a follow up appointment in 6 months.  Please call our office 2 months in advance to schedule this appointment.  You may see [name], MD or one of the following Advanced Practice Providers on your designated Care Team:   Bernerd Pho, PA-C [facility]) . Ermalinda Barrios, PA-C  ([place])  Any Other Special Instructions Will Be Listed Below (If Applicable). None

## 2018-08-13 NOTE — Progress Notes (Signed)
SUBJECTIVE: The patient presents for past due follow-up. Coronary angiography on 03/25/15 demonstrated a 95% stenosis in the proximal to mid LAD for which a drug-eluting stent was placed. He also had 100% stenosis of the proximal to distal RCA lesion. The RCA filled in the PDA region via collaterals from the LAD and PL via the circumflex. Long-term Brilinta was recommended.  TTE on 06/01/17 showed severely reduced systolic function, LVEF 10-93%. There was mild MR and TR and moderate PI. PASP 40.  He has end-stage renal disease and is on dialysis.  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  He has "failed back syndrome" and therapies are limited due to multiple comorbidities.  He is planning to undergo surgery which has not been scheduled.  He denies chest pain.  He has been compliant with dialysis.  He is here with his wife.  His quality of life has been significantly affected by back pain.    Review of Systems: As per "subjective", otherwise negative.  Allergies  Allergen Reactions  . Morphine And Related     Upset stomach     Current Outpatient Medications  Medication Sig Dispense Refill  . albuterol (PROAIR HFA) 108 (90 Base) MCG/ACT inhaler Inhale 2 puffs into the lungs every 4 (four) hours as needed.    Marland Kitchen amLODipine (NORVASC) 10 MG tablet Take 10 mg by mouth daily.    Marland Kitchen aspirin EC 81 MG tablet Take 1 tablet (81 mg total) by mouth daily.    Marland Kitchen atorvastatin (LIPITOR) 40 MG tablet Take 1 tablet (40 mg total) by mouth daily. 90 tablet 3  . Buprenorphine HCl (BELBUCA) 750 MCG FILM Place 750 mcg inside cheek 2 (two) times daily.    . cyclobenzaprine (FLEXERIL) 5 MG tablet Take 5 mg by mouth 2 (two) times daily.    Marland Kitchen docusate sodium (COLACE) 100 MG capsule Take 100 mg by mouth daily.    . DULoxetine (CYMBALTA) 30  MG capsule Take 30 mg by mouth daily.    . fluticasone (FLONASE) 50 MCG/ACT nasal spray Place 2 sprays into both nostrils daily.    . hydrALAZINE (APRESOLINE) 50 MG tablet TAKE ONE AND ONE-HALF TABLETS BY MOUTH THREE TIMES DAILY 105 tablet 0  . nitroGLYCERIN (NITROSTAT) 0.4 MG SL tablet Place 1 tablet (0.4 mg total) under the tongue every 5 (five) minutes x 3 doses as needed for chest pain (If no relief after 3rd dose, proceed to the ED for an evaluation). I 25 tablet 0  . ondansetron (ZOFRAN) 4 MG tablet Take 4 mg by mouth every 8 (eight) hours as needed for nausea or vomiting.    . OXYGEN Inhale 2 L into the lungs continuous.     . ranitidine (ZANTAC) 150 MG capsule Take 150 mg by mouth daily.    . traZODone (DESYREL) 50 MG tablet Take 50 mg by mouth at bedtime.      No current facility-administered medications for this visit.    Facility-Administered Medications Ordered in Other Visits  Medication Dose Route Frequency Provider Last Rate Last Dose  . 0.9 %  sodium chloride infusion   Intravenous Continuous Monia Sabal, PA-C      . ceFAZolin (ANCEF) 2 g in dextrose 5 % 100 mL IVPB  2 g Intravenous Once Monia Sabal, PA-C  Past Medical History:  Diagnosis Date  . Anxiety    occas. panic attack, takes xanax occas  . Arthritis    herniated disc, lumbar   . Back pain   . CAD (coronary artery disease)    03/25/2015 95% prox to mid LAD treated with Promus Premier DES 3.5x26mm postdilated to 4.25mm, 100% prox to distal RCA lesion filled in the PDA region via collaterals from LAD and PLA  . Cancer ([place])    - skin ca on face- removed   . CHF (congestive heart failure) ([place])   . Chronic kidney disease   . Complication of anesthesia    " it takes a long time to get over it."   . Diabetes mellitus without complication ([place])    type 2  . Dialysis patient ([place])   . Fibromyalgia   . GERD (gastroesophageal reflux disease)    otc- pepcid , approx. every other  day    . H/O acute  respiratory failure   . Hematuria    being followed by Dr. Hinda Lenis for decreased kidney function   . History of blood product transfusion 02/13/2014   After having back surgery  . Hypercholesteremia   . Hypertension   . Myocardial infarction ([place]) 03/2015  . Pneumonia    "walking"  . Shortness of breath dyspnea   . Sleep apnea    test aborted, due to not able to relax , since the aborted test he had surgery for gallbladder & he reports that he was told that he has sleep apnea     Past Surgical History:  Procedure Laterality Date  . A/V FISTULAGRAM Left 10/31/2016   Procedure: A/V Fistulagram;  Surgeon: Serafina Mitchell, MD;  Location: [place] [facility];  Service: Cardiovascular;  Laterality: Left;  . AV FISTULA PLACEMENT Left 02/02/2015   Procedure: LEFT ARM RADIOCEPHALIC ARTERIOVENOUS (AV) FISTULA CREATION;  Surgeon: Conrad [place], MD;  Location: [place];  Service: Vascular;  Laterality: Left;  . AV FISTULA PLACEMENT Left 05/25/2016   Procedure: ARTERIOVENOUS (AV) FISTULA CREATION;  Surgeon: [name], MD;  [facility];  Service: Vascular;  Laterality: Left;  . BACK SURGERY  02/13/14  . BIOPSY  03/08/2018   Procedure: BIOPSY;  Surgeon: Rogene Houston, MD;  [facility];  Service: Endoscopy;;  gastric  . CARDIAC CATHETERIZATION N/A 03/25/2015   Procedure: Left Heart Cath and Coronary Angiography;  Surgeon: Leonie Man, MD;  Location: [place] [facility];  Service: Cardiovascular;  Laterality: N/A;  . CARDIAC CATHETERIZATION N/A 03/25/2015   Procedure: Coronary Stent Intervention;  Surgeon: Leonie Man, MD;  Location: [place] [facility];  Service: Cardiovascular;  Laterality: N/A;  . CHOLECYSTECTOMY    . COLONOSCOPY    . COLONOSCOPY WITH PROPOFOL N/A 03/08/2018   Procedure: COLONOSCOPY WITH PROPOFOL;  Surgeon: Rogene Houston, MD;  [facility];  Service: Endoscopy;  Laterality: N/A;  11:10  . EYE SURGERY     cataracts remove, bilateral, w/IOL.    Marland Kitchen INSERTION OF DIALYSIS CATHETER N/A 03/09/2015   Procedure: INSERTION OF DIALYSIS CATHETER;  Surgeon: Conrad Bandana, MD;  Location: [place];  Service: Vascular;  Laterality: N/A;  . INSERTION OF DIALYSIS CATHETER Left 04/07/2016   Procedure: INSERTION OF TUNNELED DIALYSIS CATHETER LEFT INTERNAL JUGULAR, ATTEMPTED INSERTION OF TUNNELLED CATHETER RIGHT INTERNAL JUGULAR;  Surgeon: [name], MD;  [facility];  Service: Vascular;  Laterality: Left;  . IR GENERIC HISTORICAL  07/28/2016  IR FLUORO GUIDE CV LINE LEFT 07/28/2016 Greggory Keen, MD [facility]  . IR REMOVAL TUN CV CATH W/O FL  03/28/2017  . LUMBAR LAMINECTOMY/DECOMPRESSION MICRODISCECTOMY Right 02/13/2014   Procedure: RIGHT LUMBAR FOUR-FIVE, RIGHT LUMBAR FIVE- SACRAL ONE LUMBAR LAMINECTOMY/DECOMPRESSION MICRODISCECTOMY ;  Surgeon: Ashok Pall, MD;  Location: [place] [facility];  Service: Neurosurgery;  Laterality: Right;  Right L4-5 and Right L5-S1 diskectomies  . PERIPHERAL VASCULAR BALLOON ANGIOPLASTY Left 10/31/2016   Procedure: Peripheral Vascular Balloon Angioplasty;  Surgeon: Serafina Mitchell, MD;  Location: [place] [facility];  Service: Cardiovascular;  Laterality: Left;  AV fistula  . TEE WITHOUT CARDIOVERSION N/A 05/02/2016   Procedure: TRANSESOPHAGEAL ECHOCARDIOGRAM (TEE);  Surgeon: Herminio Commons, MD;  [facility];  Service: Cardiovascular;  Laterality: N/A;  . TONSILLECTOMY      Social History   Socioeconomic History  . Marital status: Married    Spouse name: Not on file  . Number of children: 2  . Years of education: HS  . Highest education level: Not on file  Occupational History  . Not on file  Social Needs  . Financial resource strain: Not on file  . Food insecurity:    Worry: Not on file    Inability: Not on file  . Transportation needs:    Medical: Not on file    Non-medical: Not on file  Tobacco Use  . Smoking status: Former Smoker    Packs/day: 0.50    Years: 40.00    Pack years:  20.00    Types: Cigarettes    Start date: 10/17/1973    Last attempt to quit: 02/07/2015    Years since quitting: 3.5  . Smokeless tobacco: Never Used  Substance and Sexual Activity  . Alcohol use: No    Alcohol/week: 0.0 standard drinks  . Drug use: No  . Sexual activity: Not on file  Lifestyle  . Physical activity:    Days per week: Not on file    Minutes per session: Not on file  . Stress: Not on file  Relationships  . Social connections:    Talks on phone: Not on file    Gets together: Not on file    Attends religious service: Not on file    Active member of club or organization: Not on file    Attends meetings of clubs or organizations: Not on file    Relationship status: Not on file  . Intimate partner violence:    Fear of current or ex partner: Not on file    Emotionally abused: Not on file    Physically abused: Not on file    Forced sexual activity: Not on file  Other Topics Concern  . Not on file  Social History Narrative   Drinks 3 Diet sodas a day      Vitals:   08/13/18 1301  BP: 140/72  Pulse: 79  SpO2: 92%  Weight: 226 lb (102.5 kg)  Height: 5\' 11"  (1.803 m)    Wt Readings from Last 3 Encounters:  08/13/18 226 lb (102.5 kg)  03/08/18 225 lb (102.1 kg)  03/05/18 225 lb (102.1 kg)     PHYSICAL EXAM General: NAD, chronically ill-appearing HEENT: Normal. Neck: No JVD, no thyromegaly. Lungs: Clear to auscultation bilaterally with normal respiratory effort. CV: Regular rate and rhythm, normal S1/S2, no T4/H9, soft systolicmurmur over b/l upper sternal borders.  Chronic appearing trace bilateral lower extremity edema.    Abdomen: Soft, nontender, no distention.  Neurologic: Alert and  oriented.  Psych: Normal affect. Skin: Normal. Musculoskeletal: No gross deformities.    ECG: Reviewed above under Subjective   Labs: Lab Results  Component Value Date/Time   K 4.1 03/08/2018 09:18 AM   BUN 37 (H) 03/05/2018 03:04 PM   CREATININE 4.97 (H)  03/05/2018 03:04 PM   ALT 25 03/31/2016 07:20 PM   HGB 11.6 (L) 03/08/2018 09:18 AM     Lipids: Lab Results  Component Value Date/Time   LDLCALC 69 02/13/2015 02:25 AM   CHOL 111 02/13/2015 02:25 AM   TRIG 54 02/13/2015 02:25 AM   HDL 31 (L) 02/13/2015 02:25 AM       ASSESSMENT AND PLAN:  1. Chronic systolic heart failure/cardiomyopathy: EF 30-35% by echo in 05/2017. Did not tolerate long-acting nitrates. Unable to use beta blocker due to long history of bradycardia and unable to use ACE inhibitors or ARB's due to end-stage renal disease. Volume management via dialysis.  I will obtain a follow-up echocardiogram to assess for interval changes in cardiac structure and function given upcoming back surgery.  2. Hyperlipidemia:  Continue Lipitor.   3. Essential HTN: Mildly elevated.  He is on both amlodiping and hydralazine.  Needs further monitoring.  4. ESRD: On hemodialysis.   5. CAD s/p LAD stent: Symptomatically stable. ContinueASA and Lipitor. Unable to use beta blockers at present due to long history of bradycardia (albeit not bradycardic today). I will obtain a Lexiscan Myoview stress test given possible upcoming back surgery.  6.  Preoperative risk stratification: Given his multiple comorbidities, he is at high risk for a major adverse cardiac event in the perioperative period.  I will obtain a Lexiscan Myoview stress test to evaluate for significant ischemia.  I will also obtain an echocardiogram to assess for interval changes in cardiac structure and function.    Disposition: Follow up 6 months   [name], M.D., F.A.C.C.

## 2018-08-15 ENCOUNTER — Other Ambulatory Visit: Payer: Self-pay | Admitting: Cardiovascular Disease

## 2018-08-19 ENCOUNTER — Telehealth: Payer: Self-pay | Admitting: Internal Medicine

## 2018-08-19 NOTE — Telephone Encounter (Signed)
Called pt  He is scheduled for echo and stress myovue as preop eval   He is asymptomatic Will have [name] / T Goins call to reschedule for early May 2020

## 2018-08-20 ENCOUNTER — Encounter (HOSPITAL_COMMUNITY): Payer: Medicare Other

## 2018-08-20 ENCOUNTER — Inpatient Hospital Stay (HOSPITAL_COMMUNITY): Admission: RE | Admit: 2018-08-20 | Payer: Medicare Other | Source: Ambulatory Visit

## 2018-08-29 ENCOUNTER — Other Ambulatory Visit: Payer: Self-pay | Admitting: Cardiovascular Disease

## 2018-08-29 MED ORDER — AMLODIPINE BESYLATE 10 MG PO TABS: 10.0000 mg | ORAL_TABLET | Freq: Every day | ORAL | 1 refills | Status: DC

## 2018-08-29 MED ORDER — AMLODIPINE BESYLATE 10 MG PO TABS: 10.0000 mg | ORAL_TABLET | Freq: Every day | ORAL | 3 refills | Status: DC

## 2018-08-29 NOTE — Telephone Encounter (Signed)
°  1. Which medications need to be refilled? (please list name of each medication and dose if known)    amLODipine (NORVASC) 10 MG tablet    2. Which pharmacy/location (including street and city if local pharmacy) is medication to be sent to?   [facility]  3. Do they need a 30 day or 90 day supply?

## 2018-08-29 NOTE — Telephone Encounter (Signed)
Pt's medication was sent to pt's pharmacy as requested. Confirmation received.  °

## 2018-08-29 NOTE — Addendum Note (Signed)
Addended by: Derl Barrow on: 08/29/2018 04:19 PM   Modules accepted: Orders

## 2018-08-29 NOTE — Telephone Encounter (Signed)
That would be fine 

## 2018-08-29 NOTE — Telephone Encounter (Signed)
Pt's pharmacy is requesting a refill on amlodipine. Dr. Bronson Ing did not prescribe this medication. Would Dr. Bronson Ing like to refill this medication? Please address

## 2018-09-10 ENCOUNTER — Ambulatory Visit: Payer: Managed Care, Other (non HMO) | Admitting: Cardiovascular Disease

## 2018-10-08 ENCOUNTER — Encounter (HOSPITAL_COMMUNITY): Payer: Medicare Other

## 2018-10-08 ENCOUNTER — Other Ambulatory Visit (HOSPITAL_COMMUNITY): Payer: Medicare Other

## 2018-11-19 ENCOUNTER — Other Ambulatory Visit: Payer: Self-pay | Admitting: Cardiovascular Disease

## 2018-12-10 ENCOUNTER — Telehealth (INDEPENDENT_AMBULATORY_CARE_PROVIDER_SITE_OTHER): Payer: Medicare Other | Admitting: Cardiovascular Disease

## 2018-12-10 ENCOUNTER — Encounter: Payer: Self-pay | Admitting: *Deleted

## 2018-12-10 ENCOUNTER — Telehealth: Payer: Self-pay | Admitting: Cardiovascular Disease

## 2018-12-10 ENCOUNTER — Encounter: Payer: Self-pay | Admitting: Cardiovascular Disease

## 2018-12-10 VITALS — BP 140/67 | HR 64 | Ht 72.0 in | Wt 200.0 lb

## 2018-12-10 DIAGNOSIS — N186 End stage renal disease: Secondary | ICD-10-CM

## 2018-12-10 DIAGNOSIS — I5022 Chronic systolic (congestive) heart failure: Secondary | ICD-10-CM

## 2018-12-10 DIAGNOSIS — I25118 Atherosclerotic heart disease of native coronary artery with other forms of angina pectoris: Secondary | ICD-10-CM

## 2018-12-10 DIAGNOSIS — Z955 Presence of coronary angioplasty implant and graft: Secondary | ICD-10-CM

## 2018-12-10 DIAGNOSIS — Z992 Dependence on renal dialysis: Secondary | ICD-10-CM

## 2018-12-10 DIAGNOSIS — I10 Essential (primary) hypertension: Secondary | ICD-10-CM

## 2018-12-10 DIAGNOSIS — E785 Hyperlipidemia, unspecified: Secondary | ICD-10-CM

## 2018-12-10 DIAGNOSIS — Z01818 Encounter for other preprocedural examination: Secondary | ICD-10-CM

## 2018-12-10 NOTE — Telephone Encounter (Signed)
Pre-cert Verification for the following procedure   lexiscan - cad w/ stable angina & pre-op clearance scheduled for 12-12-2018 at [facility] .

## 2018-12-10 NOTE — Progress Notes (Signed)
Virtual Visit via Telephone Note   This visit type was conducted due to national recommendations for restrictions regarding the COVID-19 Pandemic (e.g. social distancing) in an effort to limit this patient's exposure and mitigate transmission in our community.  Due to his co-morbid illnesses, this patient is at least at moderate risk for complications without adequate follow up.  This format is felt to be most appropriate for this patient at this time.  The patient did not have access to video technology/had technical difficulties with video requiring transitioning to audio format only (telephone).  All issues noted in this document were discussed and addressed.  No physical exam could be performed with this format.  Please refer to the patient's chart for his  consent to telehealth for [facility].   Date:  12/10/2018   ID:  Mark [name], DOB 26-Jun-1955, MRN [id number]  Patient Location: Home Provider Location: Office  PCP:  Sandi Mealy, MD  Cardiologist:  [name], MD  Electrophysiologist:  None   Evaluation Performed:  Follow-Up Visit  Chief Complaint: Coronary artery disease, CHF  History of Present Illness:    Mark [name] is a 63 y.o. male with coronary artery disease and chronic systolic heart failure/cardiomyopathy.  He also has end-stage renal disease and is on dialysis. He has "failed back syndrome" and therapies are limited due to multiple comorbidities.  I last saw him on 08/13/2018.  I ordered a [employer] but it has not been performed yet due to the pandemic.  I spoke to both the patient and his wife.  He was recently hospitalized at [facility] for 3 days reportedly due to the need for dialysis.  Several studies were performed including a modified barium swallow, echocardiogram, and chest CT, all of which I reviewed.  His wife says he has had more swelling in his feet and his complexion appears slightly more gray and he has been vomiting.  The  patient denies chest pain and has no significant complaints.  He has not undergone back surgery.  The patient does not have symptoms concerning for COVID-19 infection (fever, chills, cough, or new shortness of breath).    Past Medical History:  Diagnosis Date  . Anxiety    occas. panic attack, takes xanax occas  . Arthritis    herniated disc, lumbar   . Back pain   . CAD (coronary artery disease)    03/25/2015 95% prox to mid LAD treated with Promus Premier DES 3.5x47mm postdilated to 4.33mm, 100% prox to distal RCA lesion filled in the PDA region via collaterals from LAD and PLA  . Cancer ([place])    - skin ca on face- removed   . CHF (congestive heart failure) ([place])   . Chronic kidney disease   . Complication of anesthesia    " it takes a long time to get over it."   . Diabetes mellitus without complication ([place])    type 2  . Dialysis patient ([place])   . Fibromyalgia   . GERD (gastroesophageal reflux disease)    otc- pepcid , approx. every other  day    . H/O acute respiratory failure   . Hematuria    being followed by Dr. Hinda Lenis for decreased kidney function   . History of blood product transfusion 02/13/2014   After having back surgery  . Hypercholesteremia   . Hypertension   . Myocardial infarction ([place]) 03/2015  . Pneumonia    "walking"  . Shortness of breath dyspnea   .  Sleep apnea    test aborted, due to not able to relax , since the aborted test he had surgery for gallbladder & he reports that he was told that he has sleep apnea    Past Surgical History:  Procedure Laterality Date  . A/V FISTULAGRAM Left 10/31/2016   Procedure: A/V Fistulagram;  Surgeon: Serafina [place], MD;  Location: [place] [facility];  Service: Cardiovascular;  Laterality: Left;  . AV FISTULA PLACEMENT Left 02/02/2015   Procedure: LEFT ARM RADIOCEPHALIC ARTERIOVENOUS (AV) FISTULA CREATION;  Surgeon: Conrad Vermillion, MD;  Location: [place];  Service: Vascular;  Laterality: Left;  . AV FISTULA  PLACEMENT Left 05/25/2016   Procedure: ARTERIOVENOUS (AV) FISTULA CREATION;  Surgeon: [name], MD;  Location: [facility];  Service: Vascular;  Laterality: Left;  . BACK SURGERY  02/13/14  . BIOPSY  03/08/2018   Procedure: BIOPSY;  Surgeon: Rogene Houston, MD;  [facility]: [facility];  Service: Endoscopy;;  gastric  . CARDIAC CATHETERIZATION N/A 03/25/2015   Procedure: Left Heart Cath and Coronary Angiography;  Surgeon: Leonie Man, MD;  Location: [place] [facility];  Service: Cardiovascular;  Laterality: N/A;  . CARDIAC CATHETERIZATION N/A 03/25/2015   Procedure: Coronary Stent Intervention;  Surgeon: Leonie Man, MD;  Location: [place] [facility];  Service: Cardiovascular;  Laterality: N/A;  . CHOLECYSTECTOMY    . COLONOSCOPY    . COLONOSCOPY WITH PROPOFOL N/A 03/08/2018   Procedure: COLONOSCOPY WITH PROPOFOL;  Surgeon: Rogene Houston, MD;  [facility];  Service: Endoscopy;  Laterality: N/A;  11:10  . EYE SURGERY     cataracts remove, bilateral, w/IOL.  Marland Kitchen INSERTION OF DIALYSIS CATHETER N/A 03/09/2015   Procedure: INSERTION OF DIALYSIS CATHETER;  Surgeon: Conrad Sanborn, MD;  Location: [place];  Service: Vascular;  Laterality: N/A;  . INSERTION OF DIALYSIS CATHETER Left 04/07/2016   Procedure: INSERTION OF TUNNELED DIALYSIS CATHETER LEFT INTERNAL JUGULAR, ATTEMPTED INSERTION OF TUNNELLED CATHETER RIGHT INTERNAL JUGULAR;  Surgeon: [name], MD;  Location: [facility];  Service: Vascular;  Laterality: Left;  . IR GENERIC HISTORICAL  07/28/2016   IR FLUORO GUIDE CV LINE LEFT 07/28/2016 Greggory Keen, MD [facility]  . IR REMOVAL TUN CV CATH W/O FL  03/28/2017  . LUMBAR LAMINECTOMY/DECOMPRESSION MICRODISCECTOMY Right 02/13/2014   Procedure: RIGHT LUMBAR FOUR-FIVE, RIGHT LUMBAR FIVE- SACRAL ONE LUMBAR LAMINECTOMY/DECOMPRESSION MICRODISCECTOMY ;  Surgeon: Ashok Pall, MD;  Location: [place] [facility];  Service: Neurosurgery;  Laterality: Right;  Right L4-5 and Right L5-S1  diskectomies  . PERIPHERAL VASCULAR BALLOON ANGIOPLASTY Left 10/31/2016   Procedure: Peripheral Vascular Balloon Angioplasty;  Surgeon: Serafina Mitchell, MD;  Location: [place] [facility];  Service: Cardiovascular;  Laterality: Left;  AV fistula  . TEE WITHOUT CARDIOVERSION N/A 05/02/2016   Procedure: TRANSESOPHAGEAL ECHOCARDIOGRAM (TEE);  Surgeon: Herminio Commons, MD;  [facility];  Service: Cardiovascular;  Laterality: N/A;  . TONSILLECTOMY       Current Meds  Medication Sig  . albuterol (PROAIR HFA) 108 (90 Base) MCG/ACT inhaler Inhale 2 puffs into the lungs every 4 (four) hours as needed.  Marland Kitchen amLODipine (NORVASC) 10 MG tablet TAKE ONE TABLET BY MOUTH EVERY DAY  . aspirin EC 81 MG tablet Take 1 tablet (81 mg total) by mouth daily.  Marland Kitchen atorvastatin (LIPITOR) 40 MG tablet Take 1 tablet (40 mg total) by mouth daily.  . Buprenorphine HCl (BELBUCA) 750 MCG FILM Place 750 mcg inside cheek 2 (  two) times daily.  . cyclobenzaprine (FLEXERIL) 5 MG tablet Take 5 mg by mouth 2 (two) times daily.  Marland Kitchen docusate sodium (COLACE) 100 MG capsule Take 100 mg by mouth daily.  . DULoxetine (CYMBALTA) 30 MG capsule Take 30 mg by mouth daily.  . fluticasone (FLONASE) 50 MCG/ACT nasal spray Place 2 sprays into both nostrils daily.  . hydrALAZINE (APRESOLINE) 50 MG tablet TAKE ONE AND ONE-HALF TABLETS BY MOUTH THREE TIMES DAILY  . nitroGLYCERIN (NITROSTAT) 0.4 MG SL tablet Place 1 tablet (0.4 mg total) under the tongue every 5 (five) minutes x 3 doses as needed for chest pain (If no relief after 3rd dose, proceed to the ED for an evaluation). I  . ondansetron (ZOFRAN) 4 MG tablet Take 4 mg by mouth every 8 (eight) hours as needed for nausea or vomiting.  . OXYGEN Inhale 2 L into the lungs continuous.   . ranitidine (ZANTAC) 150 MG capsule Take 150 mg by mouth daily.  . traZODone (DESYREL) 50 MG tablet Take 50 mg by mouth at bedtime.      Allergies:   Morphine and related   Social History    Tobacco Use  . Smoking status: Former Smoker    Packs/day: 0.50    Years: 40.00    Pack years: 20.00    Types: Cigarettes    Start date: 10/17/1973    Quit date: 02/07/2015    Years since quitting: 3.8  . Smokeless tobacco: Never Used  Substance Use Topics  . Alcohol use: No    Alcohol/week: 0.0 standard drinks  . Drug use: No     Family Hx: The patient's family history includes Cancer in his father; Colon cancer in his maternal grandfather; Heart attack in his maternal grandfather; Heart failure in his maternal grandmother; Prostate cancer in his father. He was adopted.  ROS:   Please see the history of present illness.     All other systems reviewed and are negative.   Prior CV studies:   The following studies were reviewed today:  Coronary angiography on 03/25/15 demonstrated a 95% stenosis in the proximal to mid LAD for which a drug-eluting stent was placed. He also had 100% stenosis of the proximal to distal RCA lesion. The RCA filled in the PDA region via collaterals from the LAD and PL via the circumflex. Long-term Brilinta was recommended.   Echo (10/02/18 at [facility])  SUMMARY The left ventricle is severely dilated. Mild left ventricular hypertrophy  Left ventricular systolic function is moderately reduced. LV ejection fraction = 35-40%. Unable to fully assess LV regional wall motion The right ventricle is normal size. The right ventricular systolic function is normal. The left atrium is mildly to moderately dilated. The right atrium is mildly dilated. There is mild aortic stenosis. There was insufficient TR detected to calculate RV systolic pressure. There is mild tricuspid regurgitation. No significant stenosis seen Estimated right atrial pressure is 10 mmHg.. Mild to moderate pulmonic valvular regurgitation. There is no pericardial effusion. There is a pleural effusion present. Probably no significant change in comparison with the prior study noted    Labs/Other Tests and Data Reviewed:    EKG:  No ECG reviewed.  Recent Labs: 03/05/2018: BUN 37; Creatinine, Ser 4.97; Platelets 145 03/08/2018: Hemoglobin 11.6; Potassium 4.1; Sodium 136   Recent Lipid Panel Lab Results  Component Value Date/Time   CHOL 111 02/13/2015 02:25 AM   TRIG 54 02/13/2015 02:25 AM   HDL 31 (L) 02/13/2015 02:25 AM   CHOLHDL  3.6 02/13/2015 02:25 AM   LDLCALC 69 02/13/2015 02:25 AM    Wt Readings from Last 3 Encounters:  12/10/18 200 lb (90.7 kg)  08/13/18 226 lb (102.5 kg)  03/08/18 225 lb (102.1 kg)     Objective:    Vital Signs:  BP 140/67   Pulse 64   Ht 6' (1.829 m)   Wt 200 lb (90.7 kg)   BMI 27.12 kg/m    VITAL SIGNS:  reviewed  ASSESSMENT & PLAN:    1. Chronic systolic heart failure/cardiomyopathy: EF 35-40% by echo in 09/2018. Did not tolerate long-acting nitrates. Unable to use beta blocker due to long history of bradycardia and unable to use ACE inhibitors or ARB's due to end-stage renal disease. Volume management via dialysis.   2. Hyperlipidemia: Continue Lipitor.   3. Essential NGB:MBOMQT elevated. He is on both amlodiping and hydralazine. Needs further monitoring.  4. ESRD: On hemodialysis.   5. CAD s/p LAD stent:Symptomatically stable. ContinueASA and Lipitor. Unable to use beta blockers at present due to long history of bradycardia (albeit not bradycardic today). I will obtain a Lexiscan Myoview stress test given possible upcoming back surgery.  6.  Preoperative risk stratification: Given his end-stage renal disease and reduced cardiac function, he is at least at a moderate if not moderate to high risk for a major adverse cardiac event in the perioperative period. I will obtain a Lexiscan to evaluate for significant ischemia.   COVID-19 Education: The signs and symptoms of COVID-19 were discussed with the patient and how to seek care for testing (follow up with PCP or arrange E-visit).  The importance of social  distancing was discussed today.  Time:   Today, I have spent 25 minutes with the patient with telehealth technology discussing the above problems.     Medication Adjustments/Labs and Tests Ordered: Current medicines are reviewed at length with the patient today.  Concerns regarding medicines are outlined above.   Tests Ordered: No orders of the defined types were placed in this encounter.   Medication Changes: No orders of the defined types were placed in this encounter.   Follow Up:  Virtual Visit or In Person in 6 month(s)  Signed, [name], MD  12/10/2018 9:17 AM    [place]

## 2018-12-10 NOTE — Patient Instructions (Signed)
Medication Instructions:  Continue all current medications.  Labwork: none  Testing/Procedures:  Your physician has requested that you have a lexiscan myoview. For further information please visit [email or web address]. Please follow instruction sheet, as given.  Office will contact with results via phone or letter.    Follow-Up: Your physician wants you to follow up in: 6 months.  You will receive a reminder letter in the mail one-two months in advance.  If you don't receive a letter, please call our office to schedule the follow up appointment   Any Other Special Instructions Will Be Listed Below (If Applicable).  If you need a refill on your cardiac medications before your next appointment, please call your pharmacy.

## 2018-12-10 NOTE — Addendum Note (Signed)
Addended by: Laurine Blazer on: 12/10/2018 10:46 AM   Modules accepted: Orders

## 2018-12-12 ENCOUNTER — Encounter (HOSPITAL_BASED_OUTPATIENT_CLINIC_OR_DEPARTMENT_OTHER)
Admission: RE | Admit: 2018-12-12 | Discharge: 2018-12-12 | Disposition: A | Discharge status: Home / Self Care | Payer: Medicare Other | Source: Ambulatory Visit | Attending: Cardiovascular Disease | Admitting: Cardiovascular Disease

## 2018-12-12 ENCOUNTER — Other Ambulatory Visit: Payer: Self-pay

## 2018-12-12 ENCOUNTER — Encounter (HOSPITAL_COMMUNITY)
Admission: RE | Admit: 2018-12-12 | Discharge: 2018-12-12 | Disposition: A | Discharge status: Home / Self Care | Payer: Medicare Other | Source: Ambulatory Visit | Attending: Cardiovascular Disease | Admitting: Cardiovascular Disease

## 2018-12-12 ENCOUNTER — Encounter (HOSPITAL_COMMUNITY): Payer: Self-pay

## 2018-12-12 DIAGNOSIS — I25118 Atherosclerotic heart disease of native coronary artery with other forms of angina pectoris: Secondary | ICD-10-CM | POA: Diagnosis present

## 2018-12-12 DIAGNOSIS — Z01818 Encounter for other preprocedural examination: Secondary | ICD-10-CM | POA: Insufficient documentation

## 2018-12-12 LAB — NM MYOCAR MULTI W/SPECT W/WALL MOTION / EF
LV dias vol: 377 mL (ref 62–150)
LV sys vol: 229 mL
Peak HR: 81 {beats}/min
RATE: 0.37
Rest HR: 62 {beats}/min
SDS: 5
SRS: 6
SSS: 11
TID: 1.05

## 2018-12-12 MED ORDER — TECHNETIUM TC 99M TETROFOSMIN IV KIT
30.0000 | PACK | Freq: Once | INTRAVENOUS | Status: AC
  Administered 2018-12-12: 31 via INTRAVENOUS

## 2018-12-12 MED ORDER — SODIUM CHLORIDE 0.9% FLUSH
INTRAVENOUS | Status: AC
  Administered 2018-12-12: 10 mL via INTRAVENOUS
  Filled 2018-12-12: qty 10

## 2018-12-12 MED ORDER — REGADENOSON 0.4 MG/5ML IV SOLN
INTRAVENOUS | Status: AC
  Administered 2018-12-12: 0.4 mg via INTRAVENOUS
  Filled 2018-12-12: qty 5

## 2018-12-12 MED ORDER — TECHNETIUM TC 99M TETROFOSMIN IV KIT
10.0000 | PACK | Freq: Once | INTRAVENOUS | Status: AC | PRN
  Administered 2018-12-12: 10.68 via INTRAVENOUS

## 2018-12-13 ENCOUNTER — Telehealth: Payer: Self-pay | Admitting: *Deleted

## 2018-12-13 NOTE — Telephone Encounter (Signed)
Notes recorded by Laurine Blazer, LPN on 06/14/2109 at 7:35 PM EDT  Patient notified. Copy to pmd. He has not seen surgeon yet for surgery. He will contact office if need [place] to send this note.  ------   Notes recorded by Herminio Commons, MD on 12/12/2018 at 12:17 PM EDT  Evidence of prior heart attack with no significant blockages. He is at moderate risk for a major adverse cardiac event in the perioperative period.

## 2019-01-24 ENCOUNTER — Encounter: Payer: Self-pay | Admitting: *Deleted

## 2019-01-24 ENCOUNTER — Telehealth: Payer: Self-pay | Admitting: *Deleted

## 2019-01-24 NOTE — Progress Notes (Signed)
Request from [facility] for Fistulogram due to  " Bleeding around needle site during treatment". Scheduled for 01/28/2019.

## 2019-01-24 NOTE — Telephone Encounter (Signed)
Spoke with Joy to confirm received fax with all instructions for 01/28/2019 fistulogram at [facility]. Nasal swab at [facility] on 01/25/2019. Will review instructions letter  with patient today.

## 2019-01-25 ENCOUNTER — Other Ambulatory Visit (HOSPITAL_COMMUNITY)
Admission: RE | Admit: 2019-01-25 | Discharge: 2019-01-25 | Disposition: A | Discharge status: Home / Self Care | Payer: Medicare Other | Source: Ambulatory Visit | Attending: Surgery | Admitting: Surgery

## 2019-01-25 DIAGNOSIS — Z20828 Contact with and (suspected) exposure to other viral communicable diseases: Secondary | ICD-10-CM | POA: Insufficient documentation

## 2019-01-25 DIAGNOSIS — Z01812 Encounter for preprocedural laboratory examination: Secondary | ICD-10-CM | POA: Diagnosis present

## 2019-01-25 LAB — SARS CORONAVIRUS 2 (TAT 6-24 HRS): SARS Coronavirus 2: NEGATIVE

## 2019-01-28 ENCOUNTER — Other Ambulatory Visit: Payer: Self-pay

## 2019-01-28 ENCOUNTER — Encounter (HOSPITAL_COMMUNITY)
Admission: RE | Disposition: A | Discharge status: Home / Self Care | Payer: Self-pay | Source: Ambulatory Visit | Attending: Surgery

## 2019-01-28 ENCOUNTER — Ambulatory Visit (HOSPITAL_COMMUNITY)
Admission: RE | Admit: 2019-01-28 | Discharge: 2019-01-28 | Disposition: A | Discharge status: Home / Self Care | Payer: Medicare Other | Source: Ambulatory Visit | Attending: Surgery | Admitting: Surgery

## 2019-01-28 DIAGNOSIS — Y841 Kidney dialysis as the cause of abnormal reaction of the patient, or of later complication, without mention of misadventure at the time of the procedure: Secondary | ICD-10-CM | POA: Insufficient documentation

## 2019-01-28 DIAGNOSIS — T82858A Stenosis of vascular prosthetic devices, implants and grafts, initial encounter: Secondary | ICD-10-CM | POA: Diagnosis not present

## 2019-01-28 DIAGNOSIS — N186 End stage renal disease: Secondary | ICD-10-CM

## 2019-01-28 DIAGNOSIS — Z992 Dependence on renal dialysis: Secondary | ICD-10-CM | POA: Insufficient documentation

## 2019-01-28 DIAGNOSIS — T82898A Other specified complication of vascular prosthetic devices, implants and grafts, initial encounter: Secondary | ICD-10-CM

## 2019-01-28 HISTORY — PX: PERIPHERAL VASCULAR BALLOON ANGIOPLASTY: CATH118281

## 2019-01-28 LAB — POCT I-STAT, CHEM 8
BUN: 39 mg/dL — ABNORMAL HIGH (ref 8–23)
Calcium, Ion: 1.02 mmol/L — ABNORMAL LOW (ref 1.15–1.40)
Chloride: 93 mmol/L — ABNORMAL LOW (ref 98–111)
Creatinine, Ser: 3.9 mg/dL — ABNORMAL HIGH (ref 0.61–1.24)
Glucose, Bld: 110 mg/dL — ABNORMAL HIGH (ref 70–99)
HCT: 35 % — ABNORMAL LOW (ref 39.0–52.0)
Hemoglobin: 11.9 g/dL — ABNORMAL LOW (ref 13.0–17.0)
Potassium: 4.4 mmol/L (ref 3.5–5.1)
Sodium: 131 mmol/L — ABNORMAL LOW (ref 135–145)
TCO2: 33 mmol/L — ABNORMAL HIGH (ref 22–32)

## 2019-01-28 SURGERY — PERIPHERAL VASCULAR BALLOON ANGIOPLASTY
Anesthesia: LOCAL | Laterality: Left

## 2019-01-28 MED ORDER — FENTANYL CITRATE (PF) 100 MCG/2ML IJ SOLN
INTRAMUSCULAR | Status: AC
  Filled 2019-01-28: qty 2

## 2019-01-28 MED ORDER — IODIXANOL 320 MG/ML IV SOLN
INTRAVENOUS | Status: DC | PRN
  Administered 2019-01-28: 60 mL via INTRAVENOUS

## 2019-01-28 MED ORDER — FENTANYL CITRATE (PF) 100 MCG/2ML IJ SOLN
INTRAMUSCULAR | Status: DC | PRN
  Administered 2019-01-28 (×2): 25 ug via INTRAVENOUS

## 2019-01-28 MED ORDER — LIDOCAINE HCL (PF) 1 % IJ SOLN
INTRAMUSCULAR | Status: DC | PRN
  Administered 2019-01-28: 2 mL

## 2019-01-28 MED ORDER — MIDAZOLAM HCL 2 MG/2ML IJ SOLN
INTRAMUSCULAR | Status: AC
  Filled 2019-01-28: qty 2

## 2019-01-28 MED ORDER — LIDOCAINE HCL (PF) 1 % IJ SOLN
INTRAMUSCULAR | Status: AC
  Filled 2019-01-28: qty 30

## 2019-01-28 MED ORDER — MIDAZOLAM HCL 2 MG/2ML IJ SOLN
INTRAMUSCULAR | Status: DC | PRN
  Administered 2019-01-28 (×2): 1 mg via INTRAVENOUS

## 2019-01-28 MED ORDER — HEPARIN (PORCINE) IN NACL 1000-0.9 UT/500ML-% IV SOLN
INTRAVENOUS | Status: AC
  Filled 2019-01-28: qty 500

## 2019-01-28 MED ORDER — HEPARIN (PORCINE) IN NACL 1000-0.9 UT/500ML-% IV SOLN
INTRAVENOUS | Status: DC | PRN
  Administered 2019-01-28: 500 mL

## 2019-01-28 MED ORDER — SODIUM CHLORIDE 0.9% FLUSH: 3.0000 mL | INTRAVENOUS | Status: DC | PRN

## 2019-01-28 SURGICAL SUPPLY — 16 items
BAG SNAP BAND KOVER 36X36 (MISCELLANEOUS) ×2 IMPLANT
BALLN MUSTANG 12X60X75 (BALLOONS) ×2
BALLN MUSTANG 8X60X75 (BALLOONS) ×2
BALLOON MUSTANG 12X60X75 (BALLOONS) IMPLANT
BALLOON MUSTANG 8X60X75 (BALLOONS) IMPLANT
COVER DOME SNAP 22 D (MISCELLANEOUS) ×2 IMPLANT
KIT ENCORE 26 ADVANTAGE (KITS) ×1 IMPLANT
KIT MICROPUNCTURE NIT STIFF (SHEATH) ×1 IMPLANT
PROTECTION STATION PRESSURIZED (MISCELLANEOUS) ×2
SHEATH PINNACLE R/O II 7F 4CM (SHEATH) ×1 IMPLANT
SHEATH PROBE COVER 6X72 (BAG) ×2 IMPLANT
STATION PROTECTION PRESSURIZED (MISCELLANEOUS) ×1 IMPLANT
STOPCOCK MORSE 400PSI 3WAY (MISCELLANEOUS) ×2 IMPLANT
TRAY PV CATH (CUSTOM PROCEDURE TRAY) ×2 IMPLANT
TUBING CIL FLEX 10 FLL-RA (TUBING) ×2 IMPLANT
WIRE HITORQ VERSACORE ST 145CM (WIRE) ×1 IMPLANT

## 2019-01-28 NOTE — Progress Notes (Signed)
Discharge instructions reviewed with pt and his wife. Both voice understanding. 

## 2019-01-28 NOTE — Discharge Instructions (Signed)
Dialysis Fistulogram ° °A dialysis fistulogram is an X-ray test to look inside the site where blood is removed and returned to your body during dialysis. °Fistulas and grafts provide easy and effective access for dialysis. However, sometimes problems can occur. The fistula or graft can become clogged or narrow. This can make dialysis less effective. You may need to have a fistulogram to check for these problems. If a problem is found, your health care provider can do treatments during the procedure to clear the blocked or narrowed areas. Treatments to open a blocked dialysis fistula or graft may include: °· Removing the clot from the fistula or graft with a device (thrombectomy). °· A procedure to open or widen the blocked area with a balloon (angioplasty). °· Placing a small mesh tube (stent) to keep the fistula or graft open. °· Injecting a medicine to dissolve the clot (thrombolysis). °Tell a health care provider about: °· Any allergies you have, including any reactions you have had to contrast dye. °· All medicines you are taking, including vitamins, herbs, eye drops, creams, and over-the-counter medicines. °· Any problems you or family members have had with anesthetic medicines. °· Any blood disorders you have. °· Any surgeries you have had. °· Any medical conditions you have. °· Whether you are pregnant or may be pregnant. °· Any pain, redness, or swelling you have in your limb that has the dialysis fistula or graft. °· Any bleeding from your dialysis fistula or graft. °· Any of the following in the part of your body where the dialysis fistula or graft leads: °? Numbness. °? Tingling. °? A cold feeling. °? Areas that are bluish or pale white in color. °What are the risks? °Generally, this is a safe procedure. However, problems may occur, including: °· Infection. °· Bleeding. °· Allergic reactions to medicines or dyes. °· Damage to other structures, such as nearby nerves or blood vessels. °· A blood clot in the  dialysis fistula or graft. °· A blood clot that travels to your lungs (pulmonary embolism). °What happens before the procedure? °General instructions °· Follow instructions from your health care provider about eating and drinking restrictions. °· Ask your health care provider about: °? Changing or stopping your regular medicines. This is especially important if you are taking diabetes medicines or blood thinners (anticoagulants). °? Taking medicines such as aspirin and ibuprofen. These medicines can thin your blood. Do not take these medicines unless your health care provider tells you to take them. °? Taking over-the-counter medicines, vitamins, herbs, and supplements. °· Plan to have someone take you home from the hospital or clinic. °· Plan to have a responsible adult care for you for at least 24 hours after you leave the hospital or clinic. This is important. °· You may have a blood or urine sample taken. These will help your health care provider learn how well your kidneys and liver are working and how well your blood clots. °What happens during the procedure? °· An IV will be inserted into one of your veins. °· You will be given one or more of the following: °? A medicine to help you relax (sedative). °? A medicine to numb the area (local anesthetic) on the limb with your fistula or graft. °· A needle will be inserted into your vein. °· A small, thin tube (catheter) will be inserted into the needle and guided to the area of possible problems. °· Dye (contrast material) will be injected into the catheter. As the contrast material passes through   your body, you may feel warm. °· X-rays will be taken. The contrast material will show where any narrowing or blockages are. °· If narrowing or a blockage is seen, the health care provider may do one of the following to open the blockage: °? Thrombectomy. The health care provider will use a mechanical device at the end of the catheter to break up the  clot. °? Angioplasty. The health care provider will advance a guide wire and balloon-tipped catheter to the blockage site. The balloon will be inflated for a short period of time. A stent may also be placed to keep the blocked area open. °? Thrombolysis. The health care provider will deliver a clot-dissolving medicine through the catheter. °· When the procedure is complete, the catheter will be removed. °· In some cases, the catheter site will be closed with stitches (sutures). °· Pressure will be applied to stop any bleeding, and your skin will be covered with a bandage (dressing). °The procedure may vary among health care providers and hospitals. °What happens after the procedure? °· Your blood pressure, heart rate, breathing rate, and blood oxygen level will be monitored until you leave the hospital or clinic. Your health care provider will also monitor your access site. °· You will need to stay in bed for several hours. °· Do not drive for 24 hours if you were given a sedative during your procedure. °Summary °· A dialysis fistulogram is an X-ray test to look inside the site where blood is removed and returned to your body during dialysis. °· If a problem is found, your health care provider can also do treatments during the procedure to clear the blocked or narrowed areas. °This information is not intended to replace advice given to you by your health care provider. Make sure you discuss any questions you have with your health care provider. °Document Released: 10/06/2013 Document Revised: 06/22/2017 Document Reviewed: 06/22/2017 °Elsevier Patient Education © 2020 [employer] ° °

## 2019-01-28 NOTE — H&P (Signed)
   Patient name: Mark [name] MRN: [id number] DOB: 05-27-1956 Sex: male    HISTORY OF PRESENT ILLNESS:   Mark [name] is a 63 y.o. male with left BCF which is having issues with bleeding  CURRENT MEDICATIONS:    Current Facility-Administered Medications  Medication Dose Route Frequency Provider Last Rate Last Dose  . sodium chloride flush (NS) 0.9 % injection 3 mL  3 mL Intravenous PRN Serafina Mitchell, MD       Facility-Administered Medications Ordered in Other Encounters  Medication Dose Route Frequency Provider Last Rate Last Dose  . 0.9 %  sodium chloride infusion   Intravenous Continuous Monia Sabal, PA-C      . ceFAZolin (ANCEF) 2 g in dextrose 5 % 100 mL IVPB  2 g Intravenous Once Monia Sabal, PA-C        REVIEW OF SYSTEMS:   [X]  denotes positive finding, [ ]  denotes negative finding Cardiac  Comments:  Chest pain or chest pressure:    Shortness of breath upon exertion:    Short of breath when lying flat:    Irregular heart rhythm:    Constitutional    Fever or chills:      PHYSICAL EXAM:   Vitals:   01/28/19 0534  BP: 136/63  Pulse: 70  Resp: 14  Temp: 97.9 F (36.6 C)  SpO2: 100%  Weight: 98.9 kg  Height: 5\' 11"  (1.803 m)    GENERAL: The patient is a well-nourished male, in no acute distress. The vital signs are documented above. CARDIOVASCULAR: There is a regular rate and rhythm. PULMONARY: Non-labored respirations Palpable thrill in fiostula  STUDIES:      MEDICAL ISSUES:   fistulogram with possible intervention.  Details and risks discussed.  All questions answered  Leia Alf, MD, FACS [facility] [phone] Pager [phone]

## 2019-01-28 NOTE — Op Note (Signed)
    Patient name: Mark [name] MRN: [id number] DOB: Sep 20, 1955 Sex: male  01/28/2019 Pre-operative Diagnosis: ESRD Post-operative diagnosis:  Same Surgeon:  Annamarie Major Procedure Performed:  1.  Ultrasound-guided access, left cephalic vein  2.  Fistulogram  3.  Venoplasty, central vein (left innominate vein)  4.  Venoplasty, peripheral vein (cephalic vein)  5.  Conscious sedation (27 minutes)   Indications: The patient is having trouble with cannulation at dialysis he is here today for fistulogram  Procedure:  The patient was identified in the holding area and taken to room 8.  The patient was then placed supine on the table and prepped and draped in the usual sterile fashion.  A time out was called.  Conscious sedation was administered with the use of IV fentanyl and Versed under continuous physician and nurse monitoring.  Heart rate, blood pressure, and oxygen saturations were continuously monitored.  Total sedation time was 27 minutes ultrasound was used to evaluate the fistula.  The vein was patent and compressible.  A digital ultrasound image was acquired.  The fistula was then accessed under ultrasound guidance using a micropuncture needle.  An 018 wire was then asvanced without resistance and a micropuncture sheath was placed.  Contrast injections were then performed through the sheath.  Findings: There is approximately a 80% stenosis within the left innominate vein as it drains into the superior vena cava.  The remaining portion of the left innominate vein is also narrowed approximately 50%.  There is an 80% lesion in the cephalic vein near the turndown.  The arterial venous anastomosis is widely patent.   Intervention: After the above images were acquired the decision was made to proceed with intervention.  Over a versa core wire, a 7 French sheath was placed.  No heparin was given.  I first inserted and 8 x 60 Mustang balloon and perform central venoplasty of the innominate vein  throughout its entire length.  I then used the balloon to treat 2 areas within the cephalic vein both the turndown and the upper portion of the arm.  Follow-up imaging showed improved but suboptimal results and so I elected to upsize to a 12 x 60 Mustang balloon and repeated balloon venoplasty of the left innominate vein as well as the cephalic vein at its turndown.  Completion imaging revealed near resolution of the stenosis within the cephalic vein and less than 20% stenosis within the innominate vein.  The sheath was then removed with suture closure.  There were no complications Impression:  #1  Central venoplasty of the left innominate vein using a 12 mm balloon with residual stenosis less than 20%  #2  Peripheral venoplasty of the cephalic vein at the turndown portion 3 by an 80% lesion down to less than 15%  #3  Access remains amenable to percutaneous intervention   V. Annamarie Major, M.D., [facility] [place] Office: [phone] Pager:  [phone]

## 2019-01-29 ENCOUNTER — Encounter (HOSPITAL_COMMUNITY): Payer: Self-pay | Admitting: Surgery

## 2019-02-21 ENCOUNTER — Other Ambulatory Visit: Payer: Self-pay | Admitting: Cardiovascular Disease

## 2019-06-04 ENCOUNTER — Telehealth: Payer: Self-pay | Admitting: *Deleted

## 2019-06-04 NOTE — Telephone Encounter (Signed)
Received fax from [facility] - requesting clearance for lumbar surgery.  Date is pending.  Also, request to hold his ASA x 7 days prior.

## 2019-06-10 NOTE — Telephone Encounter (Signed)
Will defer to Dr Raliegh Ip who will be back this week   Mark Abts MD

## 2019-06-11 NOTE — Telephone Encounter (Signed)
Noted.  Will fax back to [facility] office.

## 2019-06-11 NOTE — Telephone Encounter (Signed)
Intermediate risk for cardiac event with respect to surgery. Ok to hold ASA 5 days prior.

## 2019-07-15 ENCOUNTER — Telehealth (INDEPENDENT_AMBULATORY_CARE_PROVIDER_SITE_OTHER): Payer: Medicare Other | Admitting: Cardiovascular Disease

## 2019-07-15 ENCOUNTER — Encounter: Payer: Self-pay | Admitting: Cardiovascular Disease

## 2019-07-15 VITALS — BP 166/69 | HR 76 | Ht 72.0 in | Wt 230.0 lb

## 2019-07-15 DIAGNOSIS — Z955 Presence of coronary angioplasty implant and graft: Secondary | ICD-10-CM

## 2019-07-15 DIAGNOSIS — I25118 Atherosclerotic heart disease of native coronary artery with other forms of angina pectoris: Secondary | ICD-10-CM

## 2019-07-15 DIAGNOSIS — I5022 Chronic systolic (congestive) heart failure: Secondary | ICD-10-CM

## 2019-07-15 DIAGNOSIS — E785 Hyperlipidemia, unspecified: Secondary | ICD-10-CM

## 2019-07-15 DIAGNOSIS — I10 Essential (primary) hypertension: Secondary | ICD-10-CM

## 2019-07-15 DIAGNOSIS — N186 End stage renal disease: Secondary | ICD-10-CM

## 2019-07-15 MED ORDER — ATORVASTATIN CALCIUM 40 MG PO TABS: 40.0000 mg | ORAL_TABLET | Freq: Every day | ORAL | Status: DC

## 2019-07-15 NOTE — Progress Notes (Signed)
Virtual Visit via Telephone Note   This visit type was conducted due to national recommendations for restrictions regarding the COVID-19 Pandemic (e.g. social distancing) in an effort to limit this patient's exposure and mitigate transmission in our community.  Due to his co-morbid illnesses, this patient is at least at moderate risk for complications without adequate follow up.  This format is felt to be most appropriate for this patient at this time.  The patient did not have access to video technology/had technical difficulties with video requiring transitioning to audio format only (telephone).  All issues noted in this document were discussed and addressed.  No physical exam could be performed with this format.  Please refer to the patient's chart for his  consent to telehealth for [facility].   Date:  07/15/2019   ID:  Mark [name], DOB Oct 24, 1955, MRN [id number]  Patient Location: Home Provider Location: Office  PCP:  Sandi Mealy, MD  Cardiologist:  [name], MD  Electrophysiologist:  None   Evaluation Performed:  Follow-Up Visit  Chief Complaint:  Coronary artery disease, CHF  History of Present Illness:    Mark [name] is a 64 y.o. male with coronary artery disease and chronic systolic heart failure/cardiomyopathy.  He also has end-stage renal disease and is on dialysis. He has "failed back syndrome" and therapies are limited due to multiple comorbidities. He has yet to undergo back surgery.  He denies chest pain and shortness of breath. This only seems to occur immediately prior to dialysis due to hypervolemia.  His BP gets low immediately after dialysis.   Past Medical History:  Diagnosis Date  . Anxiety    occas. panic attack, takes xanax occas  . Arthritis    herniated disc, lumbar   . Back pain   . CAD (coronary artery disease)    03/25/2015 95% prox to mid LAD treated with Promus Premier DES 3.5x9mm postdilated to 4.55mm, 100% prox to  distal RCA lesion filled in the PDA region via collaterals from LAD and PLA  . Cancer ([place])    - skin ca on face- removed   . CHF (congestive heart failure) ([place])   . Chronic kidney disease   . Complication of anesthesia    " it takes a long time to get over it."   . Diabetes mellitus without complication ([place])    type 2  . Dialysis patient ([place])   . Fibromyalgia   . GERD (gastroesophageal reflux disease)    otc- pepcid , approx. every other  day    . H/O acute respiratory failure   . Hematuria    being followed by Dr. Hinda Lenis for decreased kidney function   . History of blood product transfusion 02/13/2014   After having back surgery  . Hypercholesteremia   . Hypertension   . Myocardial infarction ([place]) 03/2015  . Pneumonia    "walking"  . Shortness of breath dyspnea   . Sleep apnea    test aborted, due to not able to relax , since the aborted test he had surgery for gallbladder & he reports that he was told that he has sleep apnea    Past Surgical History:  Procedure Laterality Date  . A/V FISTULAGRAM Left 10/31/2016   Procedure: A/V Fistulagram;  Surgeon: Serafina Mitchell, MD;  Location: [place] [facility];  Service: Cardiovascular;  Laterality: Left;  . AV FISTULA PLACEMENT Left 02/02/2015   Procedure: LEFT ARM RADIOCEPHALIC ARTERIOVENOUS (AV) FISTULA CREATION;  Surgeon: Jannette Fogo  Bridgett Larsson, MD;  Location: [place];  Service: Vascular;  Laterality: Left;  . AV FISTULA PLACEMENT Left 05/25/2016   Procedure: ARTERIOVENOUS (AV) FISTULA CREATION;  Surgeon: [name], MD;  Location: [facility];  Service: Vascular;  Laterality: Left;  . BACK SURGERY  02/13/14  . BIOPSY  03/08/2018   Procedure: BIOPSY;  Surgeon: Rogene Houston, MD;  [facility];  Service: Endoscopy;;  gastric  . CARDIAC CATHETERIZATION N/A 03/25/2015   Procedure: Left Heart Cath and Coronary Angiography;  Surgeon: Leonie Man, MD;  Location: [place] [facility];  Service: Cardiovascular;  Laterality: N/A;    . CARDIAC CATHETERIZATION N/A 03/25/2015   Procedure: Coronary Stent Intervention;  Surgeon: Leonie Man, MD;  Location: [place] [facility];  Service: Cardiovascular;  Laterality: N/A;  . CHOLECYSTECTOMY    . COLONOSCOPY    . COLONOSCOPY WITH PROPOFOL N/A 03/08/2018   Procedure: COLONOSCOPY WITH PROPOFOL;  Surgeon: Rogene Houston, MD;  [facility];  Service: Endoscopy;  Laterality: N/A;  11:10  . EYE SURGERY     cataracts remove, bilateral, w/IOL.  Marland Kitchen INSERTION OF DIALYSIS CATHETER N/A 03/09/2015   Procedure: INSERTION OF DIALYSIS CATHETER;  Surgeon: Conrad Siasconset, MD;  Location: [place];  Service: Vascular;  Laterality: N/A;  . INSERTION OF DIALYSIS CATHETER Left 04/07/2016   Procedure: INSERTION OF TUNNELED DIALYSIS CATHETER LEFT INTERNAL JUGULAR, ATTEMPTED INSERTION OF TUNNELLED CATHETER RIGHT INTERNAL JUGULAR;  Surgeon: [name], MD;  [facility];  Service: Vascular;  Laterality: Left;  . IR GENERIC HISTORICAL  07/28/2016   IR FLUORO GUIDE CV LINE LEFT 07/28/2016 Greggory Keen, MD [facility]  . IR REMOVAL TUN CV CATH W/O FL  03/28/2017  . LUMBAR LAMINECTOMY/DECOMPRESSION MICRODISCECTOMY Right 02/13/2014   Procedure: RIGHT LUMBAR FOUR-FIVE, RIGHT LUMBAR FIVE- SACRAL ONE LUMBAR LAMINECTOMY/DECOMPRESSION MICRODISCECTOMY ;  Surgeon: Ashok Pall, MD;  Location: [place] [facility];  Service: Neurosurgery;  Laterality: Right;  Right L4-5 and Right L5-S1 diskectomies  . PERIPHERAL VASCULAR BALLOON ANGIOPLASTY Left 10/31/2016   Procedure: Peripheral Vascular Balloon Angioplasty;  Surgeon: Serafina Mitchell, MD;  Location: [place] [facility];  Service: Cardiovascular;  Laterality: Left;  AV fistula  . PERIPHERAL VASCULAR BALLOON ANGIOPLASTY Left 01/28/2019   Procedure: PERIPHERAL VASCULAR BALLOON ANGIOPLASTY;  Surgeon: Serafina Mitchell, MD;  Location: [place] [facility];  Service: Cardiovascular;  Laterality: Left;  arm fistula  . TEE WITHOUT CARDIOVERSION N/A 05/02/2016    Procedure: TRANSESOPHAGEAL ECHOCARDIOGRAM (TEE);  Surgeon: Herminio Commons, MD;  [facility];  Service: Cardiovascular;  Laterality: N/A;  . TONSILLECTOMY       Current Meds  Medication Sig  . albuterol (PROAIR HFA) 108 (90 Base) MCG/ACT inhaler Inhale 2 puffs into the lungs every 4 (four) hours as needed.  Marland Kitchen amLODipine (NORVASC) 10 MG tablet TAKE ONE TABLET BY MOUTH EVERY DAY  . aspirin EC 81 MG tablet Take 1 tablet (81 mg total) by mouth daily.  . betamethasone dipropionate (DIPROLENE) 0.05 % cream Apply 1 application topically 2 (two) times daily as needed (irritation).  . Buprenorphine HCl (BELBUCA) 900 MCG FILM Place 900 mcg inside cheek 2 (two) times daily.   . cloNIDine (CATAPRES) 0.1 MG tablet Take 0.1 mg by mouth at bedtime.  . cyclobenzaprine (FLEXERIL) 10 MG tablet Take 10 mg by mouth 2 (two) times daily.   . DULoxetine (CYMBALTA) 60 MG capsule Take 60 mg by mouth daily.   . hydrALAZINE (APRESOLINE) 50 MG  tablet TAKE ONE AND ONE-HALF TABLETS BY MOUTH THREE TIMES DAILY  . lidocaine-prilocaine (EMLA) cream Apply 1 application topically as needed (before dialysis).  . multivitamin (RENA-VIT) TABS tablet Take 1 tablet by mouth daily.  . nitroGLYCERIN (NITROSTAT) 0.4 MG SL tablet Place 1 tablet (0.4 mg total) under the tongue every 5 (five) minutes x 3 doses as needed for chest pain (If no relief after 3rd dose, proceed to the ED for an evaluation). I  . ondansetron (ZOFRAN) 4 MG tablet Take 4 mg by mouth every 8 (eight) hours as needed for nausea or vomiting.  . OXYGEN Inhale 2 L into the lungs continuous.   . polyethylene glycol (MIRALAX / GLYCOLAX) 17 g packet Take 17 g by mouth daily.  . sucralfate (CARAFATE) 1 g tablet Take 1 g by mouth 3 (three) times daily.  . traZODone (DESYREL) 50 MG tablet Take 50-100 mg by mouth at bedtime as needed for sleep.   Marland Kitchen triamcinolone (NASACORT ALLERGY 24HR) 55 MCG/ACT AERO nasal inhaler Place 2 sprays into the nose daily.      Allergies:   Morphine and related and Varenicline   Social History   Tobacco Use  . Smoking status: Former Smoker    Packs/day: 0.50    Years: 40.00    Pack years: 20.00    Types: Cigarettes    Start date: 10/17/1973    Quit date: 02/07/2015    Years since quitting: 4.4  . Smokeless tobacco: Never Used  Substance Use Topics  . Alcohol use: No    Alcohol/week: 0.0 standard drinks  . Drug use: No     Family Hx: The patient's family history includes Cancer in his father; Colon cancer in his maternal grandfather; Heart attack in his maternal grandfather; Heart failure in his maternal grandmother; Prostate cancer in his father. He was adopted.  ROS:   Please see the history of present illness.     All other systems reviewed and are negative.   Prior CV studies:   The following studies were reviewed today:  Coronary angiography on 03/25/15 demonstrated a 95% stenosis in the proximal to mid LAD for which a drug-eluting stent was placed. He also had 100% stenosis of the proximal to distal RCA lesion. The RCA filled in the PDA region via collaterals from the LAD and PL via the circumflex. Long-term Brilinta was recommended.   Echo (10/02/18 at [facility])  SUMMARY The left ventricle is severely dilated. Mild left ventricular hypertrophy  Left ventricular systolic function is moderately reduced. LV ejection fraction = 35-40%. Unable to fully assess LV regional wall motion The right ventricle is normal size. The right ventricular systolic function is normal. The left atrium is mildly to moderately dilated. The right atrium is mildly dilated. There is mild aortic stenosis. There was insufficient TR detected to calculate RV systolic pressure. There is mild tricuspid regurgitation. No significant stenosis seen Estimated right atrial pressure is 10 mmHg.. Mild to moderate pulmonic valvular regurgitation. There is no pericardial effusion. There is a pleural effusion  present. Probably no significant change in comparison with the prior study noted  Nuclear stress test 12/12/2018:   There was no ST segment deviation noted during stress.  Findings consistent with prior moderate inferior myocardial infarction with minimal peri-infarct ischemia.  This is an intermediate risk study. Risk based primarily on decreased LVEF, fairly minimal amout of myocardium currently at jeopardy.  The left ventricular ejection fraction is moderately decreased (30-44%).      Labs/Other Tests and  Data Reviewed:    EKG:  No ECG reviewed.  Recent Labs: 01/28/2019: BUN 39; Creatinine, Ser 3.90; Hemoglobin 11.9; Potassium 4.4; Sodium 131   Recent Lipid Panel Lab Results  Component Value Date/Time   CHOL 111 02/13/2015 02:25 AM   TRIG 54 02/13/2015 02:25 AM   HDL 31 (L) 02/13/2015 02:25 AM   CHOLHDL 3.6 02/13/2015 02:25 AM   LDLCALC 69 02/13/2015 02:25 AM    Wt Readings from Last 3 Encounters:  07/15/19 230 lb (104.3 kg)  01/28/19 218 lb (98.9 kg)  12/10/18 200 lb (90.7 kg)     Objective:    Vital Signs:  BP (!) 166/69 Comment: have not taken morning medication yet  Pulse 76   Ht 6' (1.829 m)   Wt 230 lb (104.3 kg)   BMI 31.19 kg/m    VITAL SIGNS:  reviewed  ASSESSMENT & PLAN:    1. Chronic systolic heart failure/cardiomyopathy:EF 35-40%by echo in 09/2018.Did not tolerate long-acting nitrates. Unable to use beta blocker due to long history of bradycardia and unable to use ACE inhibitors or ARB's due to end-stage renal disease. Volume management via dialysis.  2. Hyperlipidemia:ContinueLipitor.   3. Essential LSL:HTDSKAJG elevated but he has not taken his medications this morning.  4. ESRD: On hemodialysis.   5. CAD s/p LAD stent:Symptomatically stable. ContinueASAand Lipitor. Unable to use beta blockers at present due to long history of bradycardia(albeit not bradycardic today).  Nuclear stress test on 12/12/2018 showed evidence of  myocardial infarction with minimal peri-infarct ischemia.    COVID-19 Education: The signs and symptoms of COVID-19 were discussed with the patient and how to seek care for testing (follow up with PCP or arrange E-visit).  The importance of social distancing was discussed today.  Time:   Today, I have spent 10 minutes with the patient with telehealth technology discussing the above problems.     Medication Adjustments/Labs and Tests Ordered: Current medicines are reviewed at length with the patient today.  Concerns regarding medicines are outlined above.   Tests Ordered: No orders of the defined types were placed in this encounter.   Medication Changes: No orders of the defined types were placed in this encounter.   Follow Up:  In Person in 6 month(s)  Signed, [name], MD  07/15/2019 9:18 AM    [place]

## 2019-07-15 NOTE — Patient Instructions (Signed)
Medication Instructions:  Continue all current medications.   Labwork: none  Testing/Procedures: none  Follow-Up: 6 months   Any Other Special Instructions Will Be Listed Below (If Applicable).   If you need a refill on your cardiac medications before your next appointment, please call your pharmacy.  

## 2019-07-15 NOTE — Addendum Note (Signed)
Addended by: Laurine Blazer on: 07/15/2019 09:42 AM   Modules accepted: Orders

## 2019-10-01 MED ORDER — NITROGLYCERIN 0.4 MG SL SUBL
0.40 | SUBLINGUAL_TABLET | SUBLINGUAL | Status: DC
Start: ? — End: 2019-10-01

## 2019-10-01 MED ORDER — NALOXONE HCL 4 MG/0.1ML NA LIQD
1.00 | NASAL | Status: DC
Start: ? — End: 2019-10-01

## 2019-10-01 MED ORDER — CALCIUM CARBONATE 1250 (500 CA) MG PO CHEW
500.00 | CHEWABLE_TABLET | ORAL | Status: DC
Start: ? — End: 2019-10-01

## 2019-10-01 MED ORDER — GABAPENTIN 100 MG PO CAPS
100.00 | ORAL_CAPSULE | ORAL | Status: DC
Start: 2019-10-01 — End: 2019-10-01

## 2019-10-01 MED ORDER — TAMSULOSIN HCL 0.4 MG PO CAPS
0.40 | ORAL_CAPSULE | ORAL | Status: DC
Start: 2019-10-02 — End: 2019-10-01

## 2019-10-01 MED ORDER — SODIUM CHLORIDE FLUSH 0.9 % IV SOLN
10.00 | INTRAVENOUS | Status: DC
Start: 2019-10-01 — End: 2019-10-01

## 2019-10-01 MED ORDER — PHENOL 1.4 % MT LIQD
1.00 | OROMUCOSAL | Status: DC
Start: ? — End: 2019-10-01

## 2019-10-01 MED ORDER — CARVEDILOL 3.125 MG PO TABS
6.25 | ORAL_TABLET | ORAL | Status: DC
Start: 2019-10-01 — End: 2019-10-01

## 2019-10-01 MED ORDER — BUPRENORPHINE HCL 900 MCG BU FILM
900.00 | ORAL_FILM | BUCCAL | Status: DC
Start: 2019-10-01 — End: 2019-10-01

## 2019-10-01 MED ORDER — ATORVASTATIN CALCIUM 10 MG PO TABS
20.00 | ORAL_TABLET | ORAL | Status: DC
Start: 2019-10-01 — End: 2019-10-01

## 2019-10-01 MED ORDER — DIPHENHYDRAMINE HCL 50 MG/ML IJ SOLN
25.00 | INTRAMUSCULAR | Status: DC
Start: ? — End: 2019-10-01

## 2019-10-01 MED ORDER — KETOROLAC TROMETHAMINE 30 MG/ML IJ SOLN
15.00 | INTRAMUSCULAR | Status: DC
Start: ? — End: 2019-10-01

## 2019-10-01 MED ORDER — DIPHENHYDRAMINE HCL 25 MG PO CAPS
25.00 | ORAL_CAPSULE | ORAL | Status: DC
Start: ? — End: 2019-10-01

## 2019-10-01 MED ORDER — SODIUM CHLORIDE FLUSH 0.9 % IV SOLN
10.00 | INTRAVENOUS | Status: DC
Start: ? — End: 2019-10-01

## 2019-10-01 MED ORDER — NICOTINE 7 MG/24HR TD PT24
1.00 | MEDICATED_PATCH | TRANSDERMAL | Status: DC
Start: 2019-10-02 — End: 2019-10-01

## 2019-10-01 MED ORDER — TRAMADOL HCL 50 MG PO TABS
50.00 | ORAL_TABLET | ORAL | Status: DC
Start: ? — End: 2019-10-01

## 2019-10-01 MED ORDER — DIAZEPAM 5 MG PO TABS
5.00 | ORAL_TABLET | ORAL | Status: DC
Start: ? — End: 2019-10-01

## 2019-10-01 MED ORDER — HYDRALAZINE HCL 25 MG PO TABS
25.00 | ORAL_TABLET | ORAL | Status: DC
Start: 2019-10-01 — End: 2019-10-01

## 2019-10-01 MED ORDER — OXYCODONE-ACETAMINOPHEN 10-325 MG PO TABS
1.00 | ORAL_TABLET | ORAL | Status: DC
Start: ? — End: 2019-10-01

## 2019-10-01 MED ORDER — ONDANSETRON HCL 4 MG/2ML IJ SOLN
4.00 | INTRAMUSCULAR | Status: DC
Start: ? — End: 2019-10-01

## 2019-10-01 MED ORDER — ALBUTEROL SULFATE HFA 108 (90 BASE) MCG/ACT IN AERS
2.00 | INHALATION_SPRAY | RESPIRATORY_TRACT | Status: DC
Start: ? — End: 2019-10-01

## 2019-10-01 MED ORDER — TRAZODONE HCL 50 MG PO TABS
50.00 | ORAL_TABLET | ORAL | Status: DC
Start: ? — End: 2019-10-01

## 2019-10-21 ENCOUNTER — Ambulatory Visit (INDEPENDENT_AMBULATORY_CARE_PROVIDER_SITE_OTHER): Payer: Medicare Other | Admitting: Family Medicine

## 2019-10-21 ENCOUNTER — Encounter: Payer: Self-pay | Admitting: Family Medicine

## 2019-10-21 ENCOUNTER — Other Ambulatory Visit: Payer: Self-pay

## 2019-10-21 VITALS — BP 150/62 | HR 56 | Ht 72.0 in | Wt 258.0 lb

## 2019-10-21 DIAGNOSIS — I5022 Chronic systolic (congestive) heart failure: Secondary | ICD-10-CM

## 2019-10-21 DIAGNOSIS — I10 Essential (primary) hypertension: Secondary | ICD-10-CM

## 2019-10-21 DIAGNOSIS — N186 End stage renal disease: Secondary | ICD-10-CM

## 2019-10-21 DIAGNOSIS — E785 Hyperlipidemia, unspecified: Secondary | ICD-10-CM | POA: Diagnosis not present

## 2019-10-21 DIAGNOSIS — I2510 Atherosclerotic heart disease of native coronary artery without angina pectoris: Secondary | ICD-10-CM

## 2019-10-21 DIAGNOSIS — Z992 Dependence on renal dialysis: Secondary | ICD-10-CM

## 2019-10-21 NOTE — Patient Instructions (Signed)
Medication Instructions:   Your physician recommends that you continue on your current medications as directed. Please refer to the Current Medication list given to you today.  Labwork:  NONE  Testing/Procedures:  NONE  Follow-Up:  Your physician recommends that you schedule a follow-up appointment in: as planned.  Any Other Special Instructions Will Be Listed Below (If Applicable).  If you need a refill on your cardiac medications before your next appointment, please call your pharmacy.

## 2019-10-21 NOTE — Progress Notes (Signed)
Cardiology Office Note  Date: 10/21/2019   ID: Mark [name], DOB September 13, 1955, MRN [id number]  PCP:  Leeanne Rio, MD  Cardiologist:  [name], MD Electrophysiologist:  None   Chief Complaint: Follow-up chronic systolic heart failure, CAD, ESRD on dialysis  History of Present Illness: Mark [name] is a 64 y.o. male with a history of CAD, chronic systolic heart failure, ESRD, HLD, HTN.  His last encounter was with Dr. Bronson Ing on 07/15/2019 he denied any chest pain or shortness of breath.  He did state that this only seem to occur immediately prior to dialysis due to hypervolemia.  His blood pressure gets low immediately after dialysis.  EF was 35 to 40% by echo in April 2020.  He did not tolerate long-acting nitrates.  Unable to use beta-blockers secondary to long history of bradycardia.  Unable to use ACEs or ARB secondary to end-stage renal disease.  He was continue his Lipitor.  His blood pressure was markedly elevated on that visit but he had not taken his morning medications.  His coronary artery disease was symptomatically stable.  His nuclear stress test on 12/12/2018 showed evidence of MI with minimal peri-infarct ischemia.  Patient is here status post hospital stay for anterior/posterior L3-S1 O LIF.  After extubation patient converted to ventricular tachycardia in the OR suite.  He was cardioverted and started on amiodarone.  Cardiology was consulted to manage.  EP was also consulted and recommended outpatient catheterization for further management if he would benefit from defibrillator placement.  EP mentioned that in the future they can do an EP study and if he can induce ventricular tachycardia defibrillator could be implanted.  Meanwhile advised to continue with medical therapy for now until he goes to rehab including carvedilol and possible amiodarone.  He has ischemic cardiomyopathy with LVEF of 35 to 40%. He received blood transfusion for anemia while in the  hospital.  Had been receiving inpatient hemodialysis as well.  Patient states he is now going 3-4 times per week to dialysis for 4 and 1/2 hours each time to decrease the fluid.  Patient states his blood pressure is always better after dialysis.  Blood pressure on arrival today is 150/62.  He sitting in a wheelchair with supplemental O2.  He denies any palpitations or arrhythmias.  Past Medical History:  Diagnosis Date  . Anxiety    occas. panic attack, takes xanax occas  . Arthritis    herniated disc, lumbar   . Back pain   . CAD (coronary artery disease)    03/25/2015 95% prox to mid LAD treated with Promus Premier DES 3.5x62mm postdilated to 4.64mm, 100% prox to distal RCA lesion filled in the PDA region via collaterals from LAD and PLA  . Cancer ([place])    - skin ca on face- removed   . CHF (congestive heart failure) ([place])   . Chronic kidney disease   . Complication of anesthesia    " it takes a long time to get over it."   . Diabetes mellitus without complication ([place])    type 2  . Dialysis patient ([employer])   . Fibromyalgia   . GERD (gastroesophageal reflux disease)    otc- pepcid , approx. every other  day    . H/O acute respiratory failure   . Hematuria    being followed by Dr. Hinda Lenis for decreased kidney function   . History of blood product transfusion 02/13/2014   After having back surgery  . Hypercholesteremia   .  Hypertension   . Myocardial infarction ([place]) 03/2015  . Pneumonia    "walking"  . Shortness of breath dyspnea   . Sleep apnea    test aborted, due to not able to relax , since the aborted test he had surgery for gallbladder & he reports that he was told that he has sleep apnea     Past Surgical History:  Procedure Laterality Date  . A/V FISTULAGRAM Left 10/31/2016   Procedure: A/V Fistulagram;  Surgeon: Serafina Mitchell, MD;  Location: [place] [facility];  Service: Cardiovascular;  Laterality: Left;  . AV FISTULA PLACEMENT Left 02/02/2015   Procedure: LEFT ARM  RADIOCEPHALIC ARTERIOVENOUS (AV) FISTULA CREATION;  Surgeon: Conrad Womelsdorf, MD;  Location: [place];  Service: Vascular;  Laterality: Left;  . AV FISTULA PLACEMENT Left 05/25/2016   Procedure: ARTERIOVENOUS (AV) FISTULA CREATION;  Surgeon: [name], MD;  Location: [facility];  Service: Vascular;  Laterality: Left;  . BACK SURGERY  02/13/14  . BIOPSY  03/08/2018   Procedure: BIOPSY;  Surgeon: Rogene Houston, MD;  [facility];  Service: Endoscopy;;  gastric  . CARDIAC CATHETERIZATION N/A 03/25/2015   Procedure: Left Heart Cath and Coronary Angiography;  Surgeon: Leonie Man, MD;  Location: [place] [facility];  Service: Cardiovascular;  Laterality: N/A;  . CARDIAC CATHETERIZATION N/A 03/25/2015   Procedure: Coronary Stent Intervention;  Surgeon: Leonie Man, MD;  Location: [place] [facility];  Service: Cardiovascular;  Laterality: N/A;  . CHOLECYSTECTOMY    . COLONOSCOPY    . COLONOSCOPY WITH PROPOFOL N/A 03/08/2018   Procedure: COLONOSCOPY WITH PROPOFOL;  Surgeon: Rogene Houston, MD;  [facility];  Service: Endoscopy;  Laterality: N/A;  11:10  . EYE SURGERY     cataracts remove, bilateral, w/IOL.  Marland Kitchen INSERTION OF DIALYSIS CATHETER N/A 03/09/2015   Procedure: INSERTION OF DIALYSIS CATHETER;  Surgeon: Conrad [place] Station, MD;  Location: [facility];  Service: Vascular;  Laterality: N/A;  . INSERTION OF DIALYSIS CATHETER Left 04/07/2016   Procedure: INSERTION OF TUNNELED DIALYSIS CATHETER LEFT INTERNAL JUGULAR, ATTEMPTED INSERTION OF TUNNELLED CATHETER RIGHT INTERNAL JUGULAR;  Surgeon: [name], MD;  [facility];  Service: Vascular;  Laterality: Left;  . IR GENERIC HISTORICAL  07/28/2016   IR FLUORO GUIDE CV LINE LEFT 07/28/2016 Greggory Keen, MD [facility]  . IR REMOVAL TUN CV CATH W/O FL  03/28/2017  . LUMBAR LAMINECTOMY/DECOMPRESSION MICRODISCECTOMY Right 02/13/2014   Procedure: RIGHT LUMBAR FOUR-FIVE, RIGHT LUMBAR FIVE- SACRAL ONE LUMBAR LAMINECTOMY/DECOMPRESSION  MICRODISCECTOMY ;  Surgeon: Ashok Pall, MD;  Location: [place] [facility];  Service: Neurosurgery;  Laterality: Right;  Right L4-5 and Right L5-S1 diskectomies  . PERIPHERAL VASCULAR BALLOON ANGIOPLASTY Left 10/31/2016   Procedure: Peripheral Vascular Balloon Angioplasty;  Surgeon: Serafina Mitchell, MD;  Location: [place] [facility];  Service: Cardiovascular;  Laterality: Left;  AV fistula  . PERIPHERAL VASCULAR BALLOON ANGIOPLASTY Left 01/28/2019   Procedure: PERIPHERAL VASCULAR BALLOON ANGIOPLASTY;  Surgeon: Serafina Mitchell, MD;  Location: [place] [facility];  Service: Cardiovascular;  Laterality: Left;  arm fistula  . TEE WITHOUT CARDIOVERSION N/A 05/02/2016   Procedure: TRANSESOPHAGEAL ECHOCARDIOGRAM (TEE);  Surgeon: Herminio Commons, MD;  [facility];  Service: Cardiovascular;  Laterality: N/A;  . TONSILLECTOMY      Current Outpatient Medications  Medication Sig Dispense Refill  . albuterol (PROAIR HFA) 108 (90 Base) MCG/ACT inhaler Inhale 2 puffs into the lungs every 4 (four)  hours as needed.    Marland Kitchen amLODipine (NORVASC) 10 MG tablet TAKE ONE TABLET BY MOUTH EVERY DAY 90 tablet 1  . aspirin EC 81 MG tablet Take 1 tablet (81 mg total) by mouth daily.    Marland Kitchen atorvastatin (LIPITOR) 40 MG tablet Take 1 tablet (40 mg total) by mouth daily.    . betamethasone dipropionate (DIPROLENE) 0.05 % cream Apply 1 application topically 2 (two) times daily as needed (irritation).    . Buprenorphine HCl (BELBUCA) 900 MCG FILM Place 900 mcg inside cheek 2 (two) times daily.     . cloNIDine (CATAPRES) 0.1 MG tablet Take 0.1 mg by mouth at bedtime.    . cyclobenzaprine (FLEXERIL) 10 MG tablet Take 10 mg by mouth 2 (two) times daily.     . DULoxetine (CYMBALTA) 60 MG capsule Take 60 mg by mouth daily.     . hydrALAZINE (APRESOLINE) 50 MG tablet TAKE ONE AND ONE-HALF TABLETS BY MOUTH THREE TIMES DAILY 105 tablet 6  . lidocaine-prilocaine (EMLA) cream Apply 1 application topically as needed (before  dialysis).    . multivitamin (RENA-VIT) TABS tablet Take 1 tablet by mouth daily.    . nitroGLYCERIN (NITROSTAT) 0.4 MG SL tablet Place 1 tablet (0.4 mg total) under the tongue every 5 (five) minutes x 3 doses as needed for chest pain (If no relief after 3rd dose, proceed to the ED for an evaluation). I 25 tablet 0  . ondansetron (ZOFRAN) 4 MG tablet Take 4 mg by mouth every 8 (eight) hours as needed for nausea or vomiting.    . OXYGEN Inhale 2 L into the lungs continuous.     . polyethylene glycol (MIRALAX / GLYCOLAX) 17 g packet Take 17 g by mouth daily.    . sucralfate (CARAFATE) 1 g tablet Take 1 g by mouth 3 (three) times daily.    . traZODone (DESYREL) 50 MG tablet Take 50-100 mg by mouth at bedtime as needed for sleep.     Marland Kitchen triamcinolone (NASACORT ALLERGY 24HR) 55 MCG/ACT AERO nasal inhaler Place 2 sprays into the nose daily.     No current facility-administered medications for this visit.   Facility-Administered Medications Ordered in Other Visits  Medication Dose Route Frequency Provider Last Rate Last Admin  . 0.9 %  sodium chloride infusion   Intravenous Continuous Monia Sabal, PA-C   New Bag at 03/08/18 0920  . ceFAZolin (ANCEF) 2 g in dextrose 5 % 100 mL IVPB  2 g Intravenous Once Monia Sabal, PA-C       Allergies:  Morphine and related and Varenicline   Social History: The patient  reports that he quit smoking about 4 years ago. His smoking use included cigarettes. He started smoking about 46 years ago. He has a 20.00 pack-year smoking history. He has never used smokeless tobacco. He reports that he does not drink alcohol or use drugs.   Family History: The patient's family history includes Cancer in his father; Colon cancer in his maternal grandfather; Heart attack in his maternal grandfather; Heart failure in his maternal grandmother; Prostate cancer in his father. He was adopted.   ROS:  Please see the history of present illness. Otherwise, complete review of systems is  positive for none.  All other systems are reviewed and negative.   Physical Exam: VS:  There were no vitals taken for this visit., BMI There is no height or weight on file to calculate BMI.  Wt Readings from Last 3 Encounters:  07/15/19 230 lb (  104.3 kg)  01/28/19 218 lb (98.9 kg)  12/10/18 200 lb (90.7 kg)    General: Obese patient appears comfortable at rest. Neck: Supple, no elevated JVP or carotid bruits, no thyromegaly. Lungs: Clear to auscultation, nonlabored breathing at rest. Cardiac: Regular rate and rhythm, no S3 or significant systolic murmur, no pericardial rub. Extremities: Mild nonpitting edema, distal pulses 2+. Skin: Warm and dry. Musculoskeletal: No kyphosis. Neuropsychiatric: Alert and oriented x3, affect grossly appropriate.  ECG:  An ECG dated 10/21/2019 was personally reviewed today and demonstrated:  Sinus bradycardia rate of 53, nonspecific interventricular conduction delay, abnormal QRST angle consider primary T wave abnormality.  Recent Labwork: 01/28/2019: BUN 39; Creatinine, Ser 3.90; Hemoglobin 11.9; Potassium 4.4; Sodium 131     Component Value Date/Time   CHOL 111 02/13/2015 0225   TRIG 54 02/13/2015 0225   HDL 31 (L) 02/13/2015 0225   CHOLHDL 3.6 02/13/2015 0225   VLDL 11 02/13/2015 0225   LDLCALC 69 02/13/2015 0225    Other Studies Reviewed Today:  Echocardiogram 09/24/2019 at [facility] SUMMARY  There is moderate eccentric left ventricular hypertrophy with basal and mid inferoseptal akinesis and moderate hypokinesis of remaining visualized segments of the left ventricle with probably moderately reduced systolic function. LV ejection fraction 35-40%. Left ventricular filling pattern is indeterminate. The left atrium is moderately dilated. There is mild aortic stenosis. RVSP not able to be calculated. LV and RV function appear to be improved compared with 2020 study. - FINDINGS: LEFT VENTRICLE The left  ventricle is mildly dilated. There is moderate eccentric left ventricular hypertrophy. Left ventricular systolic function is moderately reduced. LV ejection fraction = 35-40%. Left ventricular filling pattern is indeterminate. There is moderate global hypokinesis of the left ventricle. Basal and mid inferoseptal akinesis.  - RIGHT VENTRICLE The right ventricle is normal size. The right ventricular systolic function is normal.  LEFT ATRIUM The left atrium is moderately dilated.  RIGHT ATRIUM Right atrium not well visualized. - AORTIC VALVE The aortic valve is normal in structure and function. The aortic valve is trileaflet. There is mild aortic stenosis. There is trace aortic regurgitation. - MITRAL VALVE The mitral valve is normal in structure and function. The mitral valve leaflets appear thickened, but open well. There is trace mitral regurgitation. - TRICUSPID VALVE The tricuspid valve is not well visualized. There is trace tricuspid regurgitation. RVSP not able to be calculated. - PULMONIC VALVE The pulmonic valve is not well visualized. - ARTERIES The aortic sinus is normal size.   Coronary angiography on 03/25/15 demonstrated a 95% stenosis in the proximal to mid LAD for which a drug-eluting stent was placed. He also had 100% stenosis of the proximal to distal RCA lesion. The RCA filled in the PDA region via collaterals from the LAD and PL via the circumflex. Long-term Brilinta was recommended.   Echo (10/02/18 at [facility])  SUMMARY The left ventricle is severely dilated. Mild left ventricular hypertrophy  Left ventricular systolic function is moderately reduced. LV ejection fraction = 35-40%. Unable to fully assess LV regional wall motion The right ventricle is normal size. The right ventricular systolic function is normal. The left atrium is mildly to moderately dilated. The right atrium is mildly dilated. There is mild aortic stenosis. There was  insufficient TR detected to calculate RV systolic pressure. There is mild tricuspid regurgitation. No significant stenosis seen Estimated right atrial pressure is 10 mmHg.. Mild to moderate pulmonic valvular regurgitation. There is no pericardial effusion. There is  a pleural effusion present. Probably no significant change in comparison with the prior study noted  Nuclear stress test 12/12/2018:   There was no ST segment deviation noted during stress.  Findings consistent with prior moderate inferior myocardial infarction with minimal peri-infarct ischemia.  This is an intermediate risk study. Risk based primarily on decreased LVEF, fairly minimal amout of myocardium currently at jeopardy.  The left ventricular ejection fraction is moderately decreased (30-44%).    Assessment and Plan:  1. Chronic systolic (congestive) heart failure ([place])   2. Hyperlipidemia LDL goal <70   3. Essential hypertension   4. ESRD (end stage renal disease) on dialysis ([place])   5. CAD in native artery    1. Chronic systolic (congestive) heart failure [facility]) Recent echocardiogram at [facility] The left ventricle is mildly dilated. There is moderate eccentric left ventricular hypertrophy. Left ventricular systolic function is moderately reduced. LV ejection fraction = 35-40%. Left ventricular filling pattern is indeterminate. There is moderate global hypokinesis of the left ventricle. Basal and mid inferoseptal akinesis.  Patient has some lower extremity edema but much better per his wife since discharge from the hospital.  Patient states  dialysis is slowly helping with edema and reducing his weight  2. Hyperlipidemia LDL goal <70 Continue atorvastatin 40 mg daily.  3. Essential hypertension Blood pressure elevated on arrival at 150/62.  Patient states once he has dialysis started his blood pressure gets better.  4. ESRD (end stage renal disease) on dialysis ([place]) Continues with  hemodialysis 3-4 times per week at 4.5 hours each.  Has a left upper extremity AV fistula with positive thrill and bruit.  Complains of some bleeding at access site for dialysis.  He was asking if he could come off aspirin.  Patient states he is 7 kg above his dry weight which is 104 kg.  5. CAD in native artery 03/31/2015.  95% proximal to mid LAD treated with Promus Premier DES 3.5 x 16 mm postdilated to 4.1 mm, 100% proximal to distal RCA lesion filled in the PDA region via collaterals from LAD and PLA.  Patient currently denies any anginal or exertional symptoms.  He is not very active.  He had a recent episode of sustained ventricular tachycardia in the operating suite after his back surgery. He was defibrillated with 200 J.  Given IV amiodarone and calcium.  He had occasional NSVT afterwards while being monitored on telemetry.  He was not discharged on amiodarone.  Medication Adjustments/Labs and Tests Ordered: Current medicines are reviewed at length with the patient today.  Concerns regarding medicines are outlined above.   Disposition: Follow-up with Dr. Bronson Ing on January 15, 2020  Earlene Plater, NP 10/21/2019 8:01 AM    [facility] [place], [place], [place] [address] Phone: [phone]; Fax: [phone]

## 2019-11-25 ENCOUNTER — Other Ambulatory Visit: Payer: Self-pay | Admitting: Cardiovascular Disease

## 2019-12-02 ENCOUNTER — Other Ambulatory Visit: Payer: Self-pay | Admitting: Cardiovascular Disease

## 2019-12-14 ENCOUNTER — Ambulatory Visit
Admission: EM | Admit: 2019-12-14 | Discharge: 2019-12-14 | Disposition: A | Discharge status: Home / Self Care | Payer: Medicare Other | Attending: Emergency Medicine | Admitting: Emergency Medicine

## 2019-12-14 ENCOUNTER — Encounter: Payer: Self-pay | Admitting: Emergency Medicine

## 2019-12-14 ENCOUNTER — Other Ambulatory Visit: Payer: Self-pay

## 2019-12-14 DIAGNOSIS — J069 Acute upper respiratory infection, unspecified: Secondary | ICD-10-CM

## 2019-12-14 DIAGNOSIS — J209 Acute bronchitis, unspecified: Secondary | ICD-10-CM

## 2019-12-14 MED ORDER — PREDNISONE 10 MG (21) PO TBPK
ORAL_TABLET | Freq: Every day | ORAL | 0 refills | Status: DC
Start: 2019-12-14 — End: 2020-01-11

## 2019-12-14 MED ORDER — DEXAMETHASONE SODIUM PHOSPHATE 10 MG/ML IJ SOLN
10.0000 mg | Freq: Once | INTRAMUSCULAR | Status: AC
  Administered 2019-12-14: 10 mg via INTRAMUSCULAR

## 2019-12-14 MED ORDER — BENZONATATE 100 MG PO CAPS
100.0000 mg | ORAL_CAPSULE | Freq: Three times a day (TID) | ORAL | 0 refills | Status: DC
Start: 2019-12-14 — End: 2020-01-15

## 2019-12-14 NOTE — Discharge Instructions (Signed)
Steroid shot given in office Get plenty of rest and push fluids Prednisone prescribed.  Take as directed and to completed Use OTC medications like ibuprofen or tylenol as needed fever or pain Follow up with PCP for recheck Call or go to the ED if you have any new or worsening symptoms such as fever, worsening cough, shortness of breath, chest tightness, chest pain, turning blue, changes in mental status, etc..Marland Kitchen

## 2019-12-14 NOTE — ED Triage Notes (Signed)
Nasal drainage with sore throat, cough, just feels bad. x1 week.

## 2019-12-14 NOTE — ED Provider Notes (Signed)
[facility]   [id number] 12/14/19 Arrival Time: 7824   CC: Cough  SUBJECTIVE: History from: patient.  PAYAM GRIBBLE is a 64 y.o. male who presents with nasal congestion and clear productive cough x 1 week.  Denies sick exposure to COVID, flu or strep.  Has tried OTC medications without relief.  Symptoms are made worse with activity.  Reports previous symptoms in the past with bronchitis.   Complains of fatigue and sore throat.  Denies fever, chills, rhinorrhea, SOB, wheezing, chest pain, nausea, changes in bowel or bladder habits.    Patient on 3 L of oxygen, reports possible hx of COPD.  Also reports kidney disease and goes to dialysis.    ROS: As per HPI.  All other pertinent ROS negative.     Past Medical History:  Diagnosis Date  . Anxiety    occas. panic attack, takes xanax occas  . Arthritis    herniated disc, lumbar   . Back pain   . CAD (coronary artery disease)    03/25/2015 95% prox to mid LAD treated with Promus Premier DES 3.5x76mm postdilated to 4.28mm, 100% prox to distal RCA lesion filled in the PDA region via collaterals from LAD and PLA  . Cancer ([place])    - skin ca on face- removed   . CHF (congestive heart failure) ([place])   . Chronic kidney disease   . Complication of anesthesia    " it takes a long time to get over it."   . Diabetes mellitus without complication ([place])    type 2  . Dialysis patient ([place])   . Fibromyalgia   . GERD (gastroesophageal reflux disease)    otc- pepcid , approx. every other  day    . H/O acute respiratory failure   . Hematuria    being followed by Dr. Hinda Lenis for decreased kidney function   . History of blood product transfusion 02/13/2014   After having back surgery  . Hypercholesteremia   . Hypertension   . Myocardial infarction ([place]) 03/2015  . Pneumonia    "walking"  . Shortness of breath dyspnea   . Sleep apnea    test aborted, due to not able to relax , since the aborted test he had surgery for gallbladder  & he reports that he was told that he has sleep apnea    Past Surgical History:  Procedure Laterality Date  . A/V FISTULAGRAM Left 10/31/2016   Procedure: A/V Fistulagram;  Surgeon: Serafina Mitchell, MD;  Location: [facility];  Service: Cardiovascular;  Laterality: Left;  . AV FISTULA PLACEMENT Left 02/02/2015   Procedure: LEFT ARM RADIOCEPHALIC ARTERIOVENOUS (AV) FISTULA CREATION;  Surgeon: Conrad Licking, MD;  Location: [facility];  Service: Vascular;  Laterality: Left;  . AV FISTULA PLACEMENT Left 05/25/2016   Procedure: ARTERIOVENOUS (AV) FISTULA CREATION;  Surgeon: [name], MD;  Location: [facility];  Service: Vascular;  Laterality: Left;  . BACK SURGERY  02/13/14  . BIOPSY  03/08/2018   Procedure: BIOPSY;  Surgeon: Rogene Houston, MD;  [facility];  Service: Endoscopy;;  gastric  . CARDIAC CATHETERIZATION N/A 03/25/2015   Procedure: Left Heart Cath and Coronary Angiography;  Surgeon: Leonie Man, MD;  Location: [facility];  Service: Cardiovascular;  Laterality: N/A;  . CARDIAC CATHETERIZATION N/A 03/25/2015   Procedure: Coronary Stent Intervention;  Surgeon: Leonie Man, MD;  Location: [facility];  Service: Cardiovascular;  Laterality: N/A;  . CHOLECYSTECTOMY    .  COLONOSCOPY    . COLONOSCOPY WITH PROPOFOL N/A 03/08/2018   Procedure: COLONOSCOPY WITH PROPOFOL;  Surgeon: Rogene Houston, MD;  [facility];  Service: Endoscopy;  Laterality: N/A;  11:10  . EYE SURGERY     cataracts remove, bilateral, w/IOL.  Marland Kitchen INSERTION OF DIALYSIS CATHETER N/A 03/09/2015   Procedure: INSERTION OF DIALYSIS CATHETER;  Surgeon: Conrad Potosi, MD;  Location: [facility];  Service: Vascular;  Laterality: N/A;  . INSERTION OF DIALYSIS CATHETER Left 04/07/2016   Procedure: INSERTION OF TUNNELED DIALYSIS CATHETER LEFT INTERNAL JUGULAR, ATTEMPTED INSERTION OF TUNNELLED CATHETER RIGHT INTERNAL JUGULAR;  Surgeon: [name], MD;  Location: [facility];  Service: Vascular;   Laterality: Left;  . IR GENERIC HISTORICAL  07/28/2016   IR FLUORO GUIDE CV LINE LEFT 07/28/2016 Greggory Keen, MD [facility]  . IR REMOVAL TUN CV CATH W/O FL  03/28/2017  . LUMBAR LAMINECTOMY/DECOMPRESSION MICRODISCECTOMY Right 02/13/2014   Procedure: RIGHT LUMBAR FOUR-FIVE, RIGHT LUMBAR FIVE- SACRAL ONE LUMBAR LAMINECTOMY/DECOMPRESSION MICRODISCECTOMY ;  Surgeon: Ashok Pall, MD;  Location: [facility];  Service: Neurosurgery;  Laterality: Right;  Right L4-5 and Right L5-S1 diskectomies  . PERIPHERAL VASCULAR BALLOON ANGIOPLASTY Left 10/31/2016   Procedure: Peripheral Vascular Balloon Angioplasty;  Surgeon: Serafina Mitchell, MD;  Location: [facility];  Service: Cardiovascular;  Laterality: Left;  AV fistula  . PERIPHERAL VASCULAR BALLOON ANGIOPLASTY Left 01/28/2019   Procedure: PERIPHERAL VASCULAR BALLOON ANGIOPLASTY;  Surgeon: Serafina Mitchell, MD;  Location: [facility];  Service: Cardiovascular;  Laterality: Left;  arm fistula  . TEE WITHOUT CARDIOVERSION N/A 05/02/2016   Procedure: TRANSESOPHAGEAL ECHOCARDIOGRAM (TEE);  Surgeon: Herminio Commons, MD;  [facility];  Service: Cardiovascular;  Laterality: N/A;  . TONSILLECTOMY     Allergies  Allergen Reactions  . Morphine And Related     Upset stomach   . Varenicline Other (See Comments)    Causes vivid dreams   Current Facility-Administered Medications on File Prior to Encounter  Medication Dose Route Frequency Provider Last Rate Last Admin  . 0.9 %  sodium chloride infusion   Intravenous Continuous Monia Sabal, PA-C   New Bag at 03/08/18 0920  . ceFAZolin (ANCEF) 2 g in dextrose 5 % 100 mL IVPB  2 g Intravenous Once Monia Sabal, PA-C       Current Outpatient Medications on File Prior to Encounter  Medication Sig Dispense Refill  . albuterol (PROAIR HFA) 108 (90 Base) MCG/ACT inhaler Inhale 2 puffs into the lungs every 4 (four) hours as needed.    . ASPERCREME LIDOCAINE 4 % PTCH Apply 1 patch  topically daily.    Marland Kitchen aspirin EC 81 MG tablet Take 1 tablet (81 mg total) by mouth daily.    Marland Kitchen atorvastatin (LIPITOR) 40 MG tablet Take 1 tablet (40 mg total) by mouth daily.    . betamethasone dipropionate (DIPROLENE) 0.05 % cream Apply 1 application topically 2 (two) times daily as needed (irritation).    . Buprenorphine HCl (BELBUCA) 450 MCG FILM Place 450 mcg inside cheek 2 (two) times daily.    . carvedilol (COREG) 6.25 MG tablet Take 1 tablet by mouth in the morning and at bedtime.    . cyclobenzaprine (FLEXERIL) 10 MG tablet Take 10 mg by mouth 2 (two) times daily.     Marland Kitchen doxycycline (VIBRA-TABS) 100 MG tablet Take 1 tablet by mouth in the morning and at bedtime.    . DULoxetine (CYMBALTA) 60 MG capsule Take  60 mg by mouth daily.     Marland Kitchen gabapentin (NEURONTIN) 100 MG capsule Take 100 mg by mouth 3 (three) times daily.    . hydrALAZINE (APRESOLINE) 25 MG tablet TAKE ONE TABLET BY MOUTH THREE TIMES DAILY 90 tablet 6  . lidocaine-prilocaine (EMLA) cream Apply 1 application topically as needed (before dialysis).    . methocarbamol (ROBAXIN) 500 MG tablet Take 500 mg by mouth 2 (two) times daily as needed.    . multivitamin (RENA-VIT) TABS tablet Take 1 tablet by mouth daily.    . nicotine (NICODERM CQ - DOSED IN MG/24 HR) 7 mg/24hr patch Place 7 mg onto the skin daily.    . nitroGLYCERIN (NITROSTAT) 0.4 MG SL tablet Place 1 tablet (0.4 mg total) under the tongue every 5 (five) minutes x 3 doses as needed for chest pain (If no relief after 3rd dose, proceed to the ED for an evaluation). I 25 tablet 0  . ondansetron (ZOFRAN) 4 MG tablet Take 4 mg by mouth every 8 (eight) hours as needed for nausea or vomiting.    Marland Kitchen oxyCODONE (OXY IR/ROXICODONE) 5 MG immediate release tablet Take 5 mg by mouth every 4 (four) hours as needed.    . OXYGEN Inhale 3.5 L into the lungs continuous.     . polyethylene glycol (MIRALAX / GLYCOLAX) 17 g packet Take 17 g by mouth daily.    . sucralfate (CARAFATE) 1 g tablet  Take 1 g by mouth 3 (three) times daily.    . tamsulosin (FLOMAX) 0.4 MG CAPS capsule Take 0.4 mg by mouth daily.    . traMADol (ULTRAM) 50 MG tablet Take 50 mg by mouth 2 (two) times daily as needed.    . triamcinolone (NASACORT ALLERGY 24HR) 55 MCG/ACT AERO nasal inhaler Place 2 sprays into the nose daily.     Social History   Socioeconomic History  . Marital status: Married    Spouse name: Not on file  . Number of children: 2  . Years of education: HS  . Highest education level: Not on file  Occupational History  . Not on file  Tobacco Use  . Smoking status: Former Smoker    Packs/day: 0.50    Years: 40.00    Pack years: 20.00    Types: Cigarettes    Start date: 10/17/1973    Quit date: 02/07/2015    Years since quitting: 4.8  . Smokeless tobacco: Never Used  Substance and Sexual Activity  . Alcohol use: No    Alcohol/week: 0.0 standard drinks  . Drug use: No  . Sexual activity: Not on file  Other Topics Concern  . Not on file  Social History Narrative   Drinks 3 Diet sodas a day    Social Determinants of Health   Financial Resource Strain:   . Difficulty of Paying Living Expenses:   Food Insecurity:   . Worried About Charity fundraiser in the Last Year:   . Arboriculturist in the Last Year:   Transportation Needs:   . Film/video editor (Medical):   Marland Kitchen Lack of Transportation (Non-Medical):   Physical Activity:   . Days of Exercise per Week:   . Minutes of Exercise per Session:   Stress:   . Feeling of Stress :   Social Connections:   . Frequency of Communication with Friends and Family:   . Frequency of Social Gatherings with Friends and Family:   . Attends Religious Services:   . Active Member  of Clubs or Organizations:   . Attends Archivist Meetings:   Marland Kitchen Marital Status:   Intimate Partner Violence:   . Fear of Current or Ex-Partner:   . Emotionally Abused:   Marland Kitchen Physically Abused:   . Sexually Abused:    Family History  Adopted: Yes    Problem Relation Age of Onset  . Prostate cancer Father   . Cancer Father        prostate  . Heart failure Maternal Grandmother   . Heart attack Maternal Grandfather   . Colon cancer Maternal Grandfather     OBJECTIVE:  Vitals:   12/14/19 1509 12/14/19 1537 12/14/19 1538  BP: (!) 148/54    Pulse: 90    Resp: 20 18   Temp: 98.7 F (37.1 C)    TempSrc: Tympanic Oral   SpO2: 94%    Weight:   230 lb (104.3 kg)    General appearance: alert; fatigued appearing, nontoxic; speaking in full sentences and tolerating own secretions HEENT: NCAT; Ears: EACs clear, TMs pearly gray; Eyes: PERRL.  EOM grossly intact. Nose: nares patent without rhinorrhea, Throat: oropharynx clear, tonsils non erythematous or enlarged, uvula midline  Neck: supple without LAD Lungs: unlabored respirations, symmetrical air entry; cough: mild; no respiratory distress; decreased breath sounds throughout Heart: regular rate and rhythm.  Skin: warm and dry Psychological: alert and cooperative; normal mood and affect   ASSESSMENT & PLAN:  1. Viral URI with cough   2. Acute bronchitis, unspecified organism     Meds ordered this encounter  Medications  . predniSONE (STERAPRED UNI-PAK 21 TAB) 10 MG (21) TBPK tablet    Sig: Take by mouth daily. Take 6 tabs by mouth daily  for 2 days, then 5 tabs for 2 days, then 4 tabs for 2 days, then 3 tabs for 2 days, 2 tabs for 2 days, then 1 tab by mouth daily for 2 days    Dispense:  42 tablet    Refill:  0    Order Specific Question:   Supervising Provider    Answer:   Raylene Everts [[id number]]  . dexamethasone (DECADRON) injection 10 mg  . benzonatate (TESSALON) 100 MG capsule    Sig: Take 1 capsule (100 mg total) by mouth every 8 (eight) hours.    Dispense:  21 capsule    Refill:  0    Order Specific Question:   Supervising Provider    Answer:   Raylene Everts [[id number]]   Steroid shot given in office Get plenty of rest and push fluids Prednisone  prescribed.  Take as directed and to completed Use OTC medications like ibuprofen or tylenol as needed fever or pain Follow up with PCP for recheck Call or go to the ED if you have any new or worsening symptoms such as fever, worsening cough, shortness of breath, chest tightness, chest pain, turning blue, changes in mental status, etc...   Reviewed expectations re: course of current medical issues. Questions answered. Outlined signs and symptoms indicating need for more acute intervention. Patient verbalized understanding. After Visit Summary given.         Lestine Box, PA-C 12/14/19 1555

## 2019-12-25 ENCOUNTER — Encounter (HOSPITAL_COMMUNITY): Payer: Self-pay | Admitting: Emergency Medicine

## 2019-12-25 ENCOUNTER — Emergency Department (HOSPITAL_COMMUNITY)
Admission: EM | Admit: 2019-12-25 | Discharge: 2019-12-25 | Disposition: A | Discharge status: Home / Self Care | Payer: Medicare Other | Attending: Emergency Medicine | Admitting: Emergency Medicine

## 2019-12-25 ENCOUNTER — Other Ambulatory Visit: Payer: Self-pay

## 2019-12-25 ENCOUNTER — Other Ambulatory Visit (HOSPITAL_COMMUNITY): Payer: Self-pay | Admitting: Nephrology

## 2019-12-25 ENCOUNTER — Ambulatory Visit (HOSPITAL_COMMUNITY)
Admission: RE | Admit: 2019-12-25 | Discharge: 2019-12-25 | Disposition: A | Discharge status: Home / Self Care | Payer: Medicare Other | Source: Ambulatory Visit | Attending: Nephrology | Admitting: Nephrology

## 2019-12-25 DIAGNOSIS — R059 Cough, unspecified: Secondary | ICD-10-CM

## 2019-12-25 DIAGNOSIS — S81812A Laceration without foreign body, left lower leg, initial encounter: Secondary | ICD-10-CM

## 2019-12-25 DIAGNOSIS — R5383 Other fatigue: Secondary | ICD-10-CM | POA: Insufficient documentation

## 2019-12-25 DIAGNOSIS — Z20822 Contact with and (suspected) exposure to covid-19: Secondary | ICD-10-CM | POA: Diagnosis not present

## 2019-12-25 DIAGNOSIS — W19XXXA Unspecified fall, initial encounter: Secondary | ICD-10-CM | POA: Insufficient documentation

## 2019-12-25 DIAGNOSIS — Y929 Unspecified place or not applicable: Secondary | ICD-10-CM | POA: Diagnosis not present

## 2019-12-25 DIAGNOSIS — Z85828 Personal history of other malignant neoplasm of skin: Secondary | ICD-10-CM | POA: Insufficient documentation

## 2019-12-25 DIAGNOSIS — R05 Cough: Secondary | ICD-10-CM | POA: Insufficient documentation

## 2019-12-25 DIAGNOSIS — I132 Hypertensive heart and chronic kidney disease with heart failure and with stage 5 chronic kidney disease, or end stage renal disease: Secondary | ICD-10-CM | POA: Diagnosis not present

## 2019-12-25 DIAGNOSIS — R7401 Elevation of levels of liver transaminase levels: Secondary | ICD-10-CM | POA: Insufficient documentation

## 2019-12-25 DIAGNOSIS — I2510 Atherosclerotic heart disease of native coronary artery without angina pectoris: Secondary | ICD-10-CM | POA: Diagnosis not present

## 2019-12-25 DIAGNOSIS — J90 Pleural effusion, not elsewhere classified: Secondary | ICD-10-CM | POA: Insufficient documentation

## 2019-12-25 DIAGNOSIS — R748 Abnormal levels of other serum enzymes: Secondary | ICD-10-CM

## 2019-12-25 DIAGNOSIS — Z992 Dependence on renal dialysis: Secondary | ICD-10-CM | POA: Diagnosis not present

## 2019-12-25 DIAGNOSIS — N186 End stage renal disease: Secondary | ICD-10-CM | POA: Insufficient documentation

## 2019-12-25 DIAGNOSIS — F17210 Nicotine dependence, cigarettes, uncomplicated: Secondary | ICD-10-CM | POA: Diagnosis not present

## 2019-12-25 DIAGNOSIS — Z7982 Long term (current) use of aspirin: Secondary | ICD-10-CM | POA: Insufficient documentation

## 2019-12-25 DIAGNOSIS — R600 Localized edema: Secondary | ICD-10-CM | POA: Insufficient documentation

## 2019-12-25 DIAGNOSIS — R0989 Other specified symptoms and signs involving the circulatory and respiratory systems: Secondary | ICD-10-CM | POA: Insufficient documentation

## 2019-12-25 DIAGNOSIS — E1122 Type 2 diabetes mellitus with diabetic chronic kidney disease: Secondary | ICD-10-CM | POA: Insufficient documentation

## 2019-12-25 DIAGNOSIS — Y999 Unspecified external cause status: Secondary | ICD-10-CM | POA: Diagnosis not present

## 2019-12-25 DIAGNOSIS — I509 Heart failure, unspecified: Secondary | ICD-10-CM | POA: Insufficient documentation

## 2019-12-25 DIAGNOSIS — Y939 Activity, unspecified: Secondary | ICD-10-CM | POA: Insufficient documentation

## 2019-12-25 DIAGNOSIS — S8992XA Unspecified injury of left lower leg, initial encounter: Secondary | ICD-10-CM | POA: Diagnosis present

## 2019-12-25 LAB — BLOOD GAS, ARTERIAL
Acid-Base Excess: 2.1 mmol/L — ABNORMAL HIGH (ref 0.0–2.0)
Bicarbonate: 26.2 mmol/L (ref 20.0–28.0)
FIO2: 32
O2 Saturation: 99.6 %
Patient temperature: 36.6
pCO2 arterial: 44.4 mmHg (ref 32.0–48.0)
pH, Arterial: 7.393 (ref 7.350–7.450)
pO2, Arterial: 144 mmHg — ABNORMAL HIGH (ref 83.0–108.0)

## 2019-12-25 LAB — CBC WITH DIFFERENTIAL/PLATELET
Abs Immature Granulocytes: 0.07 10*3/uL (ref 0.00–0.07)
Basophils Absolute: 0 10*3/uL (ref 0.0–0.1)
Basophils Relative: 0 %
Eosinophils Absolute: 0.1 10*3/uL (ref 0.0–0.5)
Eosinophils Relative: 0 %
HCT: 26.5 % — ABNORMAL LOW (ref 39.0–52.0)
Hemoglobin: 8.2 g/dL — ABNORMAL LOW (ref 13.0–17.0)
Immature Granulocytes: 0 %
Lymphocytes Relative: 3 %
Lymphs Abs: 0.6 10*3/uL — ABNORMAL LOW (ref 0.7–4.0)
MCH: 28.6 pg (ref 26.0–34.0)
MCHC: 30.9 g/dL (ref 30.0–36.0)
MCV: 92.3 fL (ref 80.0–100.0)
Monocytes Absolute: 0.6 10*3/uL (ref 0.1–1.0)
Monocytes Relative: 3 %
Neutro Abs: 16.3 10*3/uL — ABNORMAL HIGH (ref 1.7–7.7)
Neutrophils Relative %: 94 %
Platelets: 196 10*3/uL (ref 150–400)
RBC: 2.87 MIL/uL — ABNORMAL LOW (ref 4.22–5.81)
RDW: 16.4 % — ABNORMAL HIGH (ref 11.5–15.5)
WBC: 17.6 10*3/uL — ABNORMAL HIGH (ref 4.0–10.5)
nRBC: 0 % (ref 0.0–0.2)

## 2019-12-25 LAB — HEPATITIS PANEL, ACUTE
HCV Ab: NONREACTIVE
Hep A IgM: NONREACTIVE
Hep B C IgM: NONREACTIVE
Hepatitis B Surface Ag: NONREACTIVE

## 2019-12-25 LAB — COMPREHENSIVE METABOLIC PANEL
ALT: 618 U/L — ABNORMAL HIGH (ref 0–44)
AST: 300 U/L — ABNORMAL HIGH (ref 15–41)
Albumin: 3.1 g/dL — ABNORMAL LOW (ref 3.5–5.0)
Alkaline Phosphatase: 187 U/L — ABNORMAL HIGH (ref 38–126)
Anion gap: 15 (ref 5–15)
BUN: 50 mg/dL — ABNORMAL HIGH (ref 8–23)
CO2: 26 mmol/L (ref 22–32)
Calcium: 8.4 mg/dL — ABNORMAL LOW (ref 8.9–10.3)
Chloride: 88 mmol/L — ABNORMAL LOW (ref 98–111)
Creatinine, Ser: 3.93 mg/dL — ABNORMAL HIGH (ref 0.61–1.24)
GFR calc Af Amer: 18 mL/min — ABNORMAL LOW (ref >60)
GFR calc non Af Amer: 15 mL/min — ABNORMAL LOW (ref >60)
Glucose, Bld: 333 mg/dL — ABNORMAL HIGH (ref 70–99)
Potassium: 4.9 mmol/L (ref 3.5–5.1)
Sodium: 129 mmol/L — ABNORMAL LOW (ref 135–145)
Total Bilirubin: 0.7 mg/dL (ref 0.3–1.2)
Total Protein: 6.8 g/dL (ref 6.5–8.1)

## 2019-12-25 LAB — BRAIN NATRIURETIC PEPTIDE: B Natriuretic Peptide: 3151 pg/mL — ABNORMAL HIGH (ref 0.0–100.0)

## 2019-12-25 LAB — CBG MONITORING, ED: Glucose-Capillary: 305 mg/dL — ABNORMAL HIGH (ref 70–99)

## 2019-12-25 LAB — SARS CORONAVIRUS 2 BY RT PCR (HOSPITAL ORDER, PERFORMED IN ~~LOC~~ HOSPITAL LAB): SARS Coronavirus 2: NEGATIVE

## 2019-12-25 LAB — AMMONIA: Ammonia: 17 umol/L (ref 9–35)

## 2019-12-25 LAB — LACTIC ACID, PLASMA: Lactic Acid, Venous: 0.9 mmol/L (ref 0.5–1.9)

## 2019-12-25 MED ORDER — DOXYCYCLINE HYCLATE 100 MG PO CAPS
100.0000 mg | ORAL_CAPSULE | Freq: Two times a day (BID) | ORAL | 0 refills | Status: DC
Start: 2019-12-25 — End: 2020-01-10

## 2019-12-25 NOTE — Discharge Instructions (Addendum)
Be sure to keep your dialysis appointment for tomorrow.  Elevate your legs when possible.  It would help with the edema in your legs if you wore your compression stockings.  Keep the wound clean and dry and bandaged.  Take the antibiotic as directed.  Stop the prednisone.  Follow-up with your primary care provider regarding your elevated liver function test.  Return to the emergency department for any worsening symptoms

## 2019-12-25 NOTE — ED Triage Notes (Signed)
Spouse reports pt has had cough and congestion x 1 week, he had had increased confusion and fatigue since Monday

## 2019-12-25 NOTE — ED Notes (Signed)
Have paged respiratory  

## 2019-12-25 NOTE — ED Provider Notes (Signed)
[facility] Provider Note   CSN: [id number] Arrival date & time: 12/25/19  1258     History Chief Complaint  Patient presents with  . Altered Mental Status    Mark [name] is a 64 y.o. male.  HPI      Mark [name] is a 64 y.o. male with past medical history significant for coronary artery disease, CHF, diabetes type 2, and ESRD who is currently dialyzed on Monday Wednesday Friday, who presents to the Emergency Department complaining of persistent cough, chest congestion, generalized weakness and fatigue.  Symptoms have been present for 1 week.  He also endorses intermittent episodes of confusion since Monday.  He uses 3 L of oxygen by nasal cannula at home.  Patient spouse states that he will "nod off" the middle of a sentence and will seem confused and spontaneously resolve.  He denies fever, chills, shortness of breath, chest pain, abdominal pain, vomiting or diarrhea.  He does not make urine.  He was last dialyzed and received full treatment yesterday.  He was sent by Dr. Theador Hawthorne this morning for a chest x-ray.  He was seen on 12/14/2019 at a local urgent care for his cough and congestion.  He was started on [employer] and prednisone which he took for several days then stopped once the confusion began because he was concerned the prednisone was causing his confusion.  He denies known Covid exposures and states that he received both Covid vaccines.  He also notes having a small puncture wound to the anterior left lower leg that occurred 3 days ago when he struck his leg on the edge of the car door.  Past Medical History:  Diagnosis Date  . Anxiety    occas. panic attack, takes xanax occas  . Arthritis    herniated disc, lumbar   . Back pain   . CAD (coronary artery disease)    03/25/2015 95% prox to mid LAD treated with Promus Premier DES 3.5x80mm postdilated to 4.79mm, 100% prox to distal RCA lesion filled in the PDA region via collaterals from LAD and  PLA  . Cancer ([place])    - skin ca on face- removed   . CHF (congestive heart failure) ([place])   . Chronic kidney disease   . Complication of anesthesia    " it takes a long time to get over it."   . Diabetes mellitus without complication ([place])    type 2  . Dialysis patient ([place])   . Fibromyalgia   . GERD (gastroesophageal reflux disease)    otc- pepcid , approx. every other  day    . H/O acute respiratory failure   . Hematuria    being followed by Dr. Hinda Lenis for decreased kidney function   . History of blood product transfusion 02/13/2014   After having back surgery  . Hypercholesteremia   . Hypertension   . Myocardial infarction ([place]) 03/2015  . Pneumonia    "walking"  . Shortness of breath dyspnea   . Sleep apnea    test aborted, due to not able to relax , since the aborted test he had surgery for gallbladder & he reports that he was told that he has sleep apnea     Patient Active Problem List   Diagnosis Date Noted  . Positive colorectal cancer screening using Cologuard test 02/11/2018  . Ischemic cardiomyopathy   . Bacteremia   . Anxiety state 04/06/2016  . Diabetes mellitus type 2 in obese ([place]) 04/06/2016  .  Bacteremia associated with intravascular line ([place]) 04/04/2016  . Abnormal stress test 03/25/2015  . Abnormal nuclear stress test   . Cardiomyopathy ([place])   . Chronic systolic heart failure ([place])   . ESRD (end stage renal disease) on dialysis ([place])   . Pre-operative cardiovascular examination   . Positive cardiac stress test 03/24/2015  . Essential hypertension   . Acute respiratory failure ([place]) 02/12/2015  . Renal failure   . Hyperkalemia   . Chronic kidney disease (CKD), stage IV (severe) ([place]) 01/22/2015  . HNP (herniated nucleus pulposus), lumbar 02/13/2014  . Abnormal ECG 02/12/2014  . HTN (hypertension) 02/12/2014  . Bradycardia 02/12/2014    Past Surgical History:  Procedure Laterality Date  . A/V FISTULAGRAM Left 10/31/2016   Procedure: A/V  Fistulagram;  Surgeon: Serafina Mitchell, MD;  Location: [facility];  Service: Cardiovascular;  Laterality: Left;  . AV FISTULA PLACEMENT Left 02/02/2015   Procedure: LEFT ARM RADIOCEPHALIC ARTERIOVENOUS (AV) FISTULA CREATION;  Surgeon: Conrad Micro, MD;  Location: [facility];  Service: Vascular;  Laterality: Left;  . AV FISTULA PLACEMENT Left 05/25/2016   Procedure: ARTERIOVENOUS (AV) FISTULA CREATION;  Surgeon: [name], MD;  Location: [facility];  Service: Vascular;  Laterality: Left;  . BACK SURGERY  02/13/14  . BIOPSY  03/08/2018   Procedure: BIOPSY;  Surgeon: Rogene Houston, MD;  [facility];  Service: Endoscopy;;  gastric  . CARDIAC CATHETERIZATION N/A 03/25/2015   Procedure: Left Heart Cath and Coronary Angiography;  Surgeon: Leonie Man, MD;  Location: [facility];  Service: Cardiovascular;  Laterality: N/A;  . CARDIAC CATHETERIZATION N/A 03/25/2015   Procedure: Coronary Stent Intervention;  Surgeon: Leonie Man, MD;  Location: [facility];  Service: Cardiovascular;  Laterality: N/A;  . CHOLECYSTECTOMY    . COLONOSCOPY    . COLONOSCOPY WITH PROPOFOL N/A 03/08/2018   Procedure: COLONOSCOPY WITH PROPOFOL;  Surgeon: Rogene Houston, MD;  [facility];  Service: Endoscopy;  Laterality: N/A;  11:10  . EYE SURGERY     cataracts remove, bilateral, w/IOL.  Marland Kitchen INSERTION OF DIALYSIS CATHETER N/A 03/09/2015   Procedure: INSERTION OF DIALYSIS CATHETER;  Surgeon: Conrad [place], MD;  Location: [facility];  Service: Vascular;  Laterality: N/A;  . INSERTION OF DIALYSIS CATHETER Left 04/07/2016   Procedure: INSERTION OF TUNNELED DIALYSIS CATHETER LEFT INTERNAL JUGULAR, ATTEMPTED INSERTION OF TUNNELLED CATHETER RIGHT INTERNAL JUGULAR;  Surgeon: [name], MD;  Location: [facility];  Service: Vascular;  Laterality: Left;  . IR GENERIC HISTORICAL  07/28/2016   IR FLUORO GUIDE CV LINE LEFT 07/28/2016 Greggory Keen, MD [facility]  . IR REMOVAL TUN CV CATH W/O  FL  03/28/2017  . LUMBAR LAMINECTOMY/DECOMPRESSION MICRODISCECTOMY Right 02/13/2014   Procedure: RIGHT LUMBAR FOUR-FIVE, RIGHT LUMBAR FIVE- SACRAL ONE LUMBAR LAMINECTOMY/DECOMPRESSION MICRODISCECTOMY ;  Surgeon: Ashok Pall, MD;  Location: [facility];  Service: Neurosurgery;  Laterality: Right;  Right L4-5 and Right L5-S1 diskectomies  . PERIPHERAL VASCULAR BALLOON ANGIOPLASTY Left 10/31/2016   Procedure: Peripheral Vascular Balloon Angioplasty;  Surgeon: Serafina Mitchell, MD;  Location: [facility];  Service: Cardiovascular;  Laterality: Left;  AV fistula  . PERIPHERAL VASCULAR BALLOON ANGIOPLASTY Left 01/28/2019   Procedure: PERIPHERAL VASCULAR BALLOON ANGIOPLASTY;  Surgeon: Serafina Mitchell, MD;  Location: [facility];  Service: Cardiovascular;  Laterality: Left;  arm fistula  . TEE WITHOUT CARDIOVERSION N/A 05/02/2016   Procedure: TRANSESOPHAGEAL ECHOCARDIOGRAM (TEE);  Surgeon: Herminio Commons, MD;  [facility];  Service: Cardiovascular;  Laterality: N/A;  . TONSILLECTOMY         Family History  Adopted: Yes  Problem Relation Age of Onset  . Prostate cancer Father   . Cancer Father        prostate  . Heart failure Maternal Grandmother   . Heart attack Maternal Grandfather   . Colon cancer Maternal Grandfather     Social History   Tobacco Use  . Smoking status: Current Every Day Smoker    Packs/day: 0.50    Years: 40.00    Pack years: 20.00    Types: Cigarettes    Start date: 10/17/1973    Last attempt to quit: 02/07/2015    Years since quitting: 4.8  . Smokeless tobacco: Never Used  Vaping Use  . Vaping Use: Never used  Substance Use Topics  . Alcohol use: No    Alcohol/week: 0.0 standard drinks  . Drug use: No    Home Medications Prior to Admission medications   Medication Sig Start Date End Date Taking? Authorizing Provider  albuterol (PROAIR HFA) 108 (90 Base) MCG/ACT inhaler Inhale 2 puffs into the lungs every 4 (four) hours as  needed. 12/05/16   [provider]  ASPERCREME LIDOCAINE 4 % PTCH Apply 1 patch topically daily. 10/13/19   [provider]  aspirin EC 81 MG tablet Take 1 tablet (81 mg total) by mouth daily. 03/09/18   Rehman, Mechele Dawley, MD  atorvastatin (LIPITOR) 40 MG tablet Take 1 tablet (40 mg total) by mouth daily. 07/15/19 10/21/19  Herminio Commons, MD  benzonatate (TESSALON) 100 MG capsule Take 1 capsule (100 mg total) by mouth every 8 (eight) hours. 12/14/19   Wurst, [place], PA-C  betamethasone dipropionate (DIPROLENE) 0.05 % cream Apply 1 application topically 2 (two) times daily as needed (irritation).    [provider]  Buprenorphine HCl (BELBUCA) 450 MCG FILM Place 450 mcg inside cheek 2 (two) times daily.    [provider]  carvedilol (COREG) 6.25 MG tablet Take 1 tablet by mouth in the morning and at bedtime. 10/13/19   [provider]  cyclobenzaprine (FLEXERIL) 10 MG tablet Take 10 mg by mouth 2 (two) times daily.     [provider]  doxycycline (VIBRA-TABS) 100 MG tablet Take 1 tablet by mouth in the morning and at bedtime. 10/13/19   [provider]  DULoxetine (CYMBALTA) 60 MG capsule Take 60 mg by mouth daily.     [provider]  gabapentin (NEURONTIN) 100 MG capsule Take 100 mg by mouth 3 (three) times daily. 10/13/19   [provider]  hydrALAZINE (APRESOLINE) 25 MG tablet TAKE ONE TABLET BY MOUTH THREE TIMES DAILY 12/02/19   Verta Ellen., NP  lidocaine-prilocaine (EMLA) cream Apply 1 application topically as needed (before dialysis).    [provider]  methocarbamol (ROBAXIN) 500 MG tablet Take 500 mg by mouth 2 (two) times daily as needed. 10/13/19   [provider]  multivitamin (RENA-VIT) TABS tablet Take 1 tablet by mouth daily.    [provider]  nicotine (NICODERM CQ - DOSED IN MG/24 HR) 7 mg/24hr patch Place 7 mg onto the skin daily. 10/13/19   [provider]    nitroGLYCERIN (NITROSTAT) 0.4 MG SL tablet Place 1 tablet (0.4 mg total) under the tongue every 5 (five) minutes x 3 doses as needed for chest pain (If no relief after 3rd dose,  proceed to the ED for an evaluation). I 04/12/18   Herminio Commons, MD  ondansetron (ZOFRAN) 4 MG tablet Take 4 mg by mouth every 8 (eight) hours as needed for nausea or vomiting.    [provider]  oxyCODONE (OXY IR/ROXICODONE) 5 MG immediate release tablet Take 5 mg by mouth every 4 (four) hours as needed. 10/17/19   [provider]  OXYGEN Inhale 3.5 L into the lungs continuous.     [provider]  polyethylene glycol (MIRALAX / GLYCOLAX) 17 g packet Take 17 g by mouth daily.    [provider]  predniSONE (STERAPRED UNI-PAK 21 TAB) 10 MG (21) TBPK tablet Take by mouth daily. Take 6 tabs by mouth daily  for 2 days, then 5 tabs for 2 days, then 4 tabs for 2 days, then 3 tabs for 2 days, 2 tabs for 2 days, then 1 tab by mouth daily for 2 days 12/14/19   Stacey Drain, [place], PA-C  sucralfate (CARAFATE) 1 g tablet Take 1 g by mouth 3 (three) times daily.    [provider]  tamsulosin (FLOMAX) 0.4 MG CAPS capsule Take 0.4 mg by mouth daily. 10/13/19   [provider]  traMADol (ULTRAM) 50 MG tablet Take 50 mg by mouth 2 (two) times daily as needed. 09/06/19   [provider]  triamcinolone (NASACORT ALLERGY 24HR) 55 MCG/ACT AERO nasal inhaler Place 2 sprays into the nose daily.    [provider]    Allergies    Morphine and related and Varenicline  Review of Systems   Review of Systems  Constitutional: Positive for fatigue. Negative for appetite change, chills and fever.  HENT: Negative for congestion and trouble swallowing.   Respiratory: Positive for cough. Negative for chest tightness, shortness of breath and wheezing.   Cardiovascular: Negative for chest pain.  Gastrointestinal: Negative for abdominal pain, diarrhea, nausea and vomiting.   Genitourinary: Negative for dysuria.  Musculoskeletal: Negative for arthralgias.  Skin: Negative for rash.       Wound to left lower leg  Neurological: Negative for dizziness, seizures, syncope, speech difficulty, weakness (generalized weakness), numbness and headaches.  Hematological: Negative for adenopathy.    Physical Exam Updated Vital Signs BP (!) 101/54 (BP Location: Right Arm)   Pulse 68   Temp 97.9 F (36.6 C) (Temporal)   Resp 18   Ht 5\' 11"  (1.803 m)   Wt (!) 104.3 kg   SpO2 100%   BMI 32.08 kg/m   Physical Exam Vitals and nursing note reviewed.  Constitutional:      General: He is not in acute distress.    Appearance: He is not toxic-appearing or diaphoretic.     Comments: Patient appears older than stated age. Somnolent. Falls asleep mid sentence, easily awakened.  HENT:     Head: Atraumatic.     Mouth/Throat:     Mouth: Mucous membranes are moist.     Pharynx: Oropharynx is clear.  Eyes:     General: No scleral icterus.    Conjunctiva/sclera: Conjunctivae normal.     Pupils: Pupils are equal, round, and reactive to light.  Cardiovascular:     Rate and Rhythm: Normal rate and regular rhythm.     Pulses: Normal pulses.     Comments: Fistula of the left upper arm with palpable thrill Pulmonary:     Effort: Pulmonary effort is normal. No respiratory distress.     Breath sounds: Rhonchi present.     Comments: Coarse lung  sounds bilaterally, no rales or wheezing.  Patient is on 3 L O2 by nasal cannula.  He is speaking in complete sentences without respiratory difficulty. Abdominal:     General: There is no distension.     Palpations: Abdomen is soft.     Tenderness: There is no abdominal tenderness. There is no right CVA tenderness or left CVA tenderness.  Musculoskeletal:     Cervical back: Normal range of motion. No rigidity.     Right lower leg: Edema present.     Left lower leg: Edema present.     Comments: 2+ pitting edema of the bilateral lower  extremities.  There is a superfical flap type laceration of the anterior left lower leg.  No active bleeding or edema.  No surrounding erythema or lymphangitis.  Skin:    General: Skin is warm.     Capillary Refill: Capillary refill takes less than 2 seconds.  Neurological:     General: No focal deficit present.     Mental Status: He is alert and oriented to person, place, and time.     Sensory: Sensation is intact. No sensory deficit.     Motor: Motor function is intact. No weakness.     Comments: CN II through XII grossly intact.  Speech clear. mentating well.       ED Results / Procedures / Treatments   Labs (all labs ordered are listed, but only abnormal results are displayed) Labs Reviewed  COMPREHENSIVE METABOLIC PANEL - Abnormal; Notable for the following components:      Result Value   Sodium 129 (*)    Chloride 88 (*)    Glucose, Bld 333 (*)    BUN 50 (*)    Creatinine, Ser 3.93 (*)    Calcium 8.4 (*)    Albumin 3.1 (*)    AST 300 (*)    ALT 618 (*)    Alkaline Phosphatase 187 (*)    GFR calc non Af Amer 15 (*)    GFR calc Af Amer 18 (*)    All other components within normal limits  CBC WITH DIFFERENTIAL/PLATELET - Abnormal; Notable for the following components:   WBC 17.6 (*)    RBC 2.87 (*)    Hemoglobin 8.2 (*)    HCT 26.5 (*)    RDW 16.4 (*)    Neutro Abs 16.3 (*)    Lymphs Abs 0.6 (*)    All other components within normal limits  BRAIN NATRIURETIC PEPTIDE - Abnormal; Notable for the following components:   B Natriuretic Peptide 3,151.0 (*)    All other components within normal limits  BLOOD GAS, ARTERIAL - Abnormal; Notable for the following components:   pO2, Arterial 144 (*)    Acid-Base Excess 2.1 (*)    All other components within normal limits  CBG MONITORING, ED - Abnormal; Notable for the following components:   Glucose-Capillary 305 (*)    All other components within normal limits  SARS CORONAVIRUS 2 BY RT PCR (HOSPITAL ORDER, [facility])  AMMONIA  LACTIC ACID, PLASMA  HEPATITIS PANEL, ACUTE    EKG EKG Interpretation  Date/Time:  Thursday December 25 2019 14:05:02 EDT Ventricular Rate:  52 PR Interval:    QRS Duration: 131 QT Interval:  527 QTC Calculation: 491 R Axis:   29 Text Interpretation: Sinus rhythm Nonspecific intraventricular conduction delay Repol abnrm suggests ischemia, lateral leads Confirmed by Virgel Manifold [phone]) on 12/25/2019 3:27:59 PM   Radiology DG Chest  2 View  Result Date: 12/25/2019 CLINICAL DATA:  Cough.  Shortness of breath. EXAM: CHEST - 2 VIEW COMPARISON:  09/26/2019. FINDINGS: Stable cardiomegaly. Pulmonary venous congestion improved from prior exam. Bilateral interstitial prominence again noted suggesting interstitial edema. Findings have improved slightly from prior exam. Bibasilar atelectasis. Small bilateral pleural effusions again noted. IMPRESSION: 1. Cardiomegaly. Pulmonary venous congestion, improved from prior exam. Bilateral interstitial prominence again noted suggesting pulmonary interstitial edema. Findings have improved slightly from prior exam. Small bilateral pleural effusions again noted. 2.  Bibasilar atelectasis. Electronically Signed   By: Marcello Moores  Register   On: 12/25/2019 13:15    Procedures Procedures (including critical care time)  LACERATION REPAIR Performed by: Melani Brisbane Authorized by: Wilber Fini Consent: Verbal consent obtained. Risks and benefits: risks, benefits and alternatives were discussed Consent given by: patient Patient identity confirmed: provided demographic data Prepped and Draped in normal sterile fashion Wound explored  Laceration Location: left lower leg  Laceration Length: 4 cm  No Foreign Bodies seen or palpated  Anesthesia: none  Irrigation method: syringe Amount of cleaning: standard  Skin closure: steri strips  Technique: topical application  Patient tolerance: Patient tolerated the procedure well  with no immediate complications.   Medications Ordered in ED Medications - No data to display  ED Course  I have reviewed the triage vital signs and the nursing notes.  Pertinent labs & imaging results that were available during my care of the patient were reviewed by me and considered in my medical decision making (see chart for details).    MDM Rules/Calculators/A&P                          Patient with history of end-stage renal disease on dialysis, CHF and and diabetes here with a greater than 1 week history of cough and congestion with several days of intermittent confusion.  Received a full dialysis treatment yesterday.  Had a chest x-ray earlier today ordered by his nephrologist.  That shows small bilateral pleural effusions and pulmonary venous congestion improved from prior study.  On recheck, patient resting comfortably.  He is alert and oriented on my exam he does appear somnolent, but awakes to answer questions appropriately.  No focal neuro deficits on exam.  Laboratory studies show no leukocytosis, blood sugar elevated along with BNP, patient will have routine dialysis tomorrow.  Creatinine at baseline.  His transaminases are elevated, no priors for comparison.  His total bilirubin is normal.  No abdominal pain or history of vomiting or diarrhea.  etiology of this is unclear, he does have a normal ammonia level.  His lactic acid is unremarkable.  Hepatitis panel pending.  Patient also seen by Dr. Wilson Singer and care plan discussed. Intermittent somnolence felt to be related to sleep deprivation  Wound to left lower leg appears superficial.  Serous drainage noted.  Wound was cleaned and skin loosely approximated with Steri-Strips.  No surrounding erythema or excessive warmth.  Neurovascularly intact.  Discussed need for possible sleep study as he may have some sleep apnea.  He states he has tried this many times but has been unsuccessful due to anxiety.  We will start antibiotics for his  wound, he agrees to discontinue his prednisone, dialysis tomorrow and close follow-up with his PCP for recheck and repeat LFT's   Final Clinical Impression(s) / ED Diagnoses Final diagnoses:  Cough  Abnormal transaminases  Skin tear of left lower leg without complication, initial encounter    Rx /  DC Orders ED Discharge Orders    None       Kem Parkinson, PA-C 12/27/19 1540    Veryl Speak, MD 12/30/19 [phone]

## 2019-12-29 ENCOUNTER — Encounter (HOSPITAL_COMMUNITY): Payer: Self-pay | Admitting: *Deleted

## 2019-12-29 ENCOUNTER — Emergency Department (HOSPITAL_COMMUNITY)
Admission: EM | Admit: 2019-12-29 | Discharge: 2019-12-30 | Disposition: A | Discharge status: Home / Self Care | Payer: Medicare Other | Attending: Emergency Medicine | Admitting: Emergency Medicine

## 2019-12-29 ENCOUNTER — Other Ambulatory Visit: Payer: Self-pay

## 2019-12-29 ENCOUNTER — Emergency Department (HOSPITAL_COMMUNITY): Payer: Medicare Other

## 2019-12-29 DIAGNOSIS — I132 Hypertensive heart and chronic kidney disease with heart failure and with stage 5 chronic kidney disease, or end stage renal disease: Secondary | ICD-10-CM | POA: Diagnosis not present

## 2019-12-29 DIAGNOSIS — E1122 Type 2 diabetes mellitus with diabetic chronic kidney disease: Secondary | ICD-10-CM | POA: Diagnosis not present

## 2019-12-29 DIAGNOSIS — J189 Pneumonia, unspecified organism: Secondary | ICD-10-CM

## 2019-12-29 DIAGNOSIS — Z992 Dependence on renal dialysis: Secondary | ICD-10-CM | POA: Diagnosis not present

## 2019-12-29 DIAGNOSIS — Z955 Presence of coronary angioplasty implant and graft: Secondary | ICD-10-CM | POA: Insufficient documentation

## 2019-12-29 DIAGNOSIS — Z7982 Long term (current) use of aspirin: Secondary | ICD-10-CM | POA: Insufficient documentation

## 2019-12-29 DIAGNOSIS — R778 Other specified abnormalities of plasma proteins: Secondary | ICD-10-CM

## 2019-12-29 DIAGNOSIS — I5022 Chronic systolic (congestive) heart failure: Secondary | ICD-10-CM | POA: Diagnosis not present

## 2019-12-29 DIAGNOSIS — N189 Chronic kidney disease, unspecified: Secondary | ICD-10-CM

## 2019-12-29 DIAGNOSIS — N186 End stage renal disease: Secondary | ICD-10-CM

## 2019-12-29 DIAGNOSIS — Z85828 Personal history of other malignant neoplasm of skin: Secondary | ICD-10-CM | POA: Diagnosis not present

## 2019-12-29 DIAGNOSIS — Z7951 Long term (current) use of inhaled steroids: Secondary | ICD-10-CM | POA: Diagnosis not present

## 2019-12-29 DIAGNOSIS — R531 Weakness: Secondary | ICD-10-CM | POA: Diagnosis not present

## 2019-12-29 DIAGNOSIS — Z79899 Other long term (current) drug therapy: Secondary | ICD-10-CM | POA: Insufficient documentation

## 2019-12-29 DIAGNOSIS — R0602 Shortness of breath: Secondary | ICD-10-CM | POA: Diagnosis not present

## 2019-12-29 DIAGNOSIS — Z20822 Contact with and (suspected) exposure to covid-19: Secondary | ICD-10-CM | POA: Insufficient documentation

## 2019-12-29 DIAGNOSIS — R7401 Elevation of levels of liver transaminase levels: Secondary | ICD-10-CM

## 2019-12-29 DIAGNOSIS — I2510 Atherosclerotic heart disease of native coronary artery without angina pectoris: Secondary | ICD-10-CM | POA: Diagnosis not present

## 2019-12-29 DIAGNOSIS — D631 Anemia in chronic kidney disease: Secondary | ICD-10-CM

## 2019-12-29 LAB — HEPATIC FUNCTION PANEL
ALT: 146 U/L — ABNORMAL HIGH (ref 0–44)
AST: 46 U/L — ABNORMAL HIGH (ref 15–41)
Albumin: 2.7 g/dL — ABNORMAL LOW (ref 3.5–5.0)
Alkaline Phosphatase: 237 U/L — ABNORMAL HIGH (ref 38–126)
Bilirubin, Direct: 0.6 mg/dL — ABNORMAL HIGH (ref 0.0–0.2)
Indirect Bilirubin: 0.9 mg/dL (ref 0.3–0.9)
Total Bilirubin: 1.5 mg/dL — ABNORMAL HIGH (ref 0.3–1.2)
Total Protein: 6.6 g/dL (ref 6.5–8.1)

## 2019-12-29 LAB — CBC
HCT: 27.3 % — ABNORMAL LOW (ref 39.0–52.0)
Hemoglobin: 8.2 g/dL — ABNORMAL LOW (ref 13.0–17.0)
MCH: 28.1 pg (ref 26.0–34.0)
MCHC: 30 g/dL (ref 30.0–36.0)
MCV: 93.5 fL (ref 80.0–100.0)
Platelets: 195 10*3/uL (ref 150–400)
RBC: 2.92 MIL/uL — ABNORMAL LOW (ref 4.22–5.81)
RDW: 15.9 % — ABNORMAL HIGH (ref 11.5–15.5)
WBC: 14.5 10*3/uL — ABNORMAL HIGH (ref 4.0–10.5)
nRBC: 0 % (ref 0.0–0.2)

## 2019-12-29 LAB — BASIC METABOLIC PANEL
Anion gap: 14 (ref 5–15)
BUN: 22 mg/dL (ref 8–23)
CO2: 25 mmol/L (ref 22–32)
Calcium: 7.7 mg/dL — ABNORMAL LOW (ref 8.9–10.3)
Chloride: 91 mmol/L — ABNORMAL LOW (ref 98–111)
Creatinine, Ser: 2.79 mg/dL — ABNORMAL HIGH (ref 0.61–1.24)
GFR calc Af Amer: 27 mL/min — ABNORMAL LOW (ref >60)
GFR calc non Af Amer: 23 mL/min — ABNORMAL LOW (ref >60)
Glucose, Bld: 216 mg/dL — ABNORMAL HIGH (ref 70–99)
Potassium: 3.8 mmol/L (ref 3.5–5.1)
Sodium: 130 mmol/L — ABNORMAL LOW (ref 135–145)

## 2019-12-29 LAB — TROPONIN I (HIGH SENSITIVITY): Troponin I (High Sensitivity): 283 ng/L (ref <18)

## 2019-12-29 LAB — LACTIC ACID, PLASMA: Lactic Acid, Venous: 1.1 mmol/L (ref 0.5–1.9)

## 2019-12-29 MED ORDER — IOHEXOL 350 MG/ML SOLN
100.0000 mL | Freq: Once | INTRAVENOUS | Status: AC | PRN
  Administered 2019-12-29: 100 mL via INTRAVENOUS

## 2019-12-29 NOTE — ED Notes (Signed)
CRITICAL VALUE ALERT  Critical Value:Troponin 283 Date & Time Notied:  12/29/19 @ 8592 Provider Notified: [place], PA-C Orders Received/Actions taken: None yet

## 2019-12-29 NOTE — ED Triage Notes (Signed)
Pt here for increased SOB; pt was seen last week for same; pt has had a decrease in appetite and has not had a BM in almost 2 weeks; pt was diagnosed with bronchitis last week and given antibiotics with no relief; pt has been coughing and wife says pt has had increase in confusion; pt had dialysis today but still has bilateral leg and feet swelling

## 2019-12-29 NOTE — ED Notes (Signed)
Pt to CT

## 2019-12-29 NOTE — ED Notes (Signed)
Pt having fluid leaking from wound on left leg. Pt came in a couple day ago to get stitches. Pt wife states it looks infected. Pt has fistula in left arm --dialysis patient. Pt feeling fatigued and general feeling of unwell.

## 2019-12-29 NOTE — ED Provider Notes (Signed)
[facility] EMERGENCY DEPARTMENT Provider Note   CSN: [id number] Arrival date & time: 12/29/19  1607     History Chief Complaint  Patient presents with  . Shortness of Breath    Mark [name] is a 64 y.o. male with a history of DM, CAD with history of MI, CHF, chronic kidney disease on dialysis, having last received dialysis this morning 1 kg over his dry weight, presenting with increased shortness of breath, reduced appetite, cough which has sometimes productive of clear sputum.  He also endorses nasal congestion with clear rhinorrhea and pressure in his ears, specifically the left one with a fluid popping sound when he blows his nose.  He describes generalized fatigue and increased drowsiness.  He denies chest pain, palpitations, diaphoresis, also no nausea or vomiting.  Wife at the bedside also reports episodes of confusion.  He also endorses persistent and worsening bilateral lower extremity swelling, left greater than right.  He was seen here on July 22 with similar symptoms at which time his chest x-ray was clear.  He had been on prednisone and Tessalon at that time, has since stopped using the prednisone in case this was the source of confusion which has not improved.  Confusion has been intermittent, he will fall asleep mid sentence and wife states he has brief episodes of confusion.  He also had a laceration on his left lower leg at that time which was repaired with Steri-Strips and was placed on doxycycline for infection prevention.  He has increased drainage with surrounding redness around the wound site.  Pt is fully vaccinated for Covid and was last checked 4 days ago and was negative.   Upon additional review of chart, patient also had elevation in his LFT's at last weeks visit, still elevated today but improved. Hepatitis panel  and ammonia completed on the 22nd were negative.  Surgical hx sig for cholecystectomy.   The history is provided by the patient and the spouse.         Past Medical History:  Diagnosis Date  . Anxiety    occas. panic attack, takes xanax occas  . Arthritis    herniated disc, lumbar   . Back pain   . CAD (coronary artery disease)    03/25/2015 95% prox to mid LAD treated with Promus Premier DES 3.5x58mm postdilated to 4.77mm, 100% prox to distal RCA lesion filled in the PDA region via collaterals from LAD and PLA  . Cancer ([place])    - skin ca on face- removed   . CHF (congestive heart failure) ([place])   . Chronic kidney disease   . Complication of anesthesia    " it takes a long time to get over it."   . Diabetes mellitus without complication ([place])    type 2  . Dialysis patient ([place])   . Fibromyalgia   . GERD (gastroesophageal reflux disease)    otc- pepcid , approx. every other  day    . H/O acute respiratory failure   . Hematuria    being followed by Dr. Hinda Lenis for decreased kidney function   . History of blood product transfusion 02/13/2014   After having back surgery  . Hypercholesteremia   . Hypertension   . Myocardial infarction ([place]) 03/2015  . Pneumonia    "walking"  . Shortness of breath dyspnea   . Sleep apnea    test aborted, due to not able to relax , since the aborted test he had surgery for gallbladder & he reports  that he was told that he has sleep apnea     Patient Active Problem List   Diagnosis Date Noted  . Positive colorectal cancer screening using Cologuard test 02/11/2018  . Ischemic cardiomyopathy   . Bacteremia   . Anxiety state 04/06/2016  . Diabetes mellitus type 2 in obese ([place]) 04/06/2016  . Bacteremia associated with intravascular line ([place]) 04/04/2016  . Abnormal stress test 03/25/2015  . Abnormal nuclear stress test   . Cardiomyopathy ([place])   . Chronic systolic heart failure ([place])   . ESRD (end stage renal disease) on dialysis ([place])   . Pre-operative cardiovascular examination   . Positive cardiac stress test 03/24/2015  . Essential hypertension   . Acute respiratory failure ([place])  02/12/2015  . Renal failure   . Hyperkalemia   . Chronic kidney disease (CKD), stage IV (severe) ([place]) 01/22/2015  . HNP (herniated nucleus pulposus), lumbar 02/13/2014  . Abnormal ECG 02/12/2014  . HTN (hypertension) 02/12/2014  . Bradycardia 02/12/2014    Past Surgical History:  Procedure Laterality Date  . A/V FISTULAGRAM Left 10/31/2016   Procedure: A/V Fistulagram;  Surgeon: Serafina Mitchell, MD;  Location: [place] [facility];  Service: Cardiovascular;  Laterality: Left;  . AV FISTULA PLACEMENT Left 02/02/2015   Procedure: LEFT ARM RADIOCEPHALIC ARTERIOVENOUS (AV) FISTULA CREATION;  Surgeon: Conrad Latah, MD;  Location: [place];  Service: Vascular;  Laterality: Left;  . AV FISTULA PLACEMENT Left 05/25/2016   Procedure: ARTERIOVENOUS (AV) FISTULA CREATION;  Surgeon: [name], MD;  Location: [facility];  Service: Vascular;  Laterality: Left;  . BACK SURGERY  02/13/14  . BIOPSY  03/08/2018   Procedure: BIOPSY;  Surgeon: Rogene Houston, MD;  Location: [facility];  Service: Endoscopy;;  gastric  . CARDIAC CATHETERIZATION N/A 03/25/2015   Procedure: Left Heart Cath and Coronary Angiography;  Surgeon: Leonie Man, MD;  Location: [place] [facility];  Service: Cardiovascular;  Laterality: N/A;  . CARDIAC CATHETERIZATION N/A 03/25/2015   Procedure: Coronary Stent Intervention;  Surgeon: Leonie Man, MD;  Location: [place] [facility];  Service: Cardiovascular;  Laterality: N/A;  . CHOLECYSTECTOMY    . COLONOSCOPY    . COLONOSCOPY WITH PROPOFOL N/A 03/08/2018   Procedure: COLONOSCOPY WITH PROPOFOL;  Surgeon: Rogene Houston, MD;  [facility];  Service: Endoscopy;  Laterality: N/A;  11:10  . EYE SURGERY     cataracts remove, bilateral, w/IOL.  Marland Kitchen INSERTION OF DIALYSIS CATHETER N/A 03/09/2015   Procedure: INSERTION OF DIALYSIS CATHETER;  Surgeon: Conrad , MD;  Location: [place];  Service: Vascular;  Laterality: N/A;  . INSERTION OF DIALYSIS CATHETER Left 04/07/2016    Procedure: INSERTION OF TUNNELED DIALYSIS CATHETER LEFT INTERNAL JUGULAR, ATTEMPTED INSERTION OF TUNNELLED CATHETER RIGHT INTERNAL JUGULAR;  Surgeon: [name], MD;  Location: [facility];  Service: Vascular;  Laterality: Left;  . IR GENERIC HISTORICAL  07/28/2016   IR FLUORO GUIDE CV LINE LEFT 07/28/2016 Greggory Keen, MD [facility]-INTER[facility] RAD  . IR REMOVAL TUN CV CATH W/O FL  03/28/2017  . LUMBAR LAMINECTOMY/DECOMPRESSION MICRODISCECTOMY Right 02/13/2014   Procedure: RIGHT LUMBAR FOUR-FIVE, RIGHT LUMBAR FIVE- SACRAL ONE LUMBAR LAMINECTOMY/DECOMPRESSION MICRODISCECTOMY ;  Surgeon: Ashok Pall, MD;  Location: [place] [facility];  Service: Neurosurgery;  Laterality: Right;  Right L4-5 and Right L5-S1 diskectomies  . PERIPHERAL VASCULAR BALLOON ANGIOPLASTY Left 10/31/2016   Procedure: Peripheral Vascular Balloon Angioplasty;  Surgeon: Serafina Mitchell, MD;  Location: [place] [facility];  Service: Cardiovascular;  Laterality: Left;  AV fistula  . PERIPHERAL VASCULAR BALLOON ANGIOPLASTY Left 01/28/2019   Procedure: PERIPHERAL VASCULAR BALLOON ANGIOPLASTY;  Surgeon: Serafina Mitchell, MD;  Location: [place] [facility];  Service: Cardiovascular;  Laterality: Left;  arm fistula  . TEE WITHOUT CARDIOVERSION N/A 05/02/2016   Procedure: TRANSESOPHAGEAL ECHOCARDIOGRAM (TEE);  Surgeon: Herminio Commons, MD;  [facility];  Service: Cardiovascular;  Laterality: N/A;  . TONSILLECTOMY         Family History  Adopted: Yes  Problem Relation Age of Onset  . Prostate cancer Father   . Cancer Father        prostate  . Heart failure Maternal Grandmother   . Heart attack Maternal Grandfather   . Colon cancer Maternal Grandfather     Social History   Tobacco Use  . Smoking status: Current Every Day Smoker    Packs/day: 0.50    Years: 40.00    Pack years: 20.00    Types: Cigarettes    Start date: 10/17/1973    Last attempt to quit: 02/07/2015    Years since quitting: 4.8  . Smokeless tobacco: Never Used    Vaping Use  . Vaping Use: Never used  Substance Use Topics  . Alcohol use: No    Alcohol/week: 0.0 standard drinks  . Drug use: No    Home Medications Prior to Admission medications   Medication Sig Start Date End Date Taking? Authorizing Provider  albuterol (PROAIR HFA) 108 (90 Base) MCG/ACT inhaler Inhale 2 puffs into the lungs every 4 (four) hours as needed. 12/05/16   [provider]  ASPERCREME LIDOCAINE 4 % PTCH Apply 1 patch topically daily. 10/13/19   [provider]  aspirin EC 81 MG tablet Take 1 tablet (81 mg total) by mouth daily. 03/09/18   Rehman, Mechele Dawley, MD  atorvastatin (LIPITOR) 40 MG tablet Take 1 tablet (40 mg total) by mouth daily. 07/15/19 10/21/19  Herminio Commons, MD  benzonatate (TESSALON) 100 MG capsule Take 1 capsule (100 mg total) by mouth every 8 (eight) hours. 12/14/19   Wurst, [place], PA-C  betamethasone dipropionate (DIPROLENE) 0.05 % cream Apply 1 application topically 2 (two) times daily as needed (irritation).    [provider]  Buprenorphine HCl (BELBUCA) 450 MCG FILM Place 450 mcg inside cheek 2 (two) times daily.    [provider]  carvedilol (COREG) 6.25 MG tablet Take 1 tablet by mouth in the morning and at bedtime. 10/13/19   [provider]  cyclobenzaprine (FLEXERIL) 10 MG tablet Take 10 mg by mouth 2 (two) times daily.     [provider]  doxycycline (VIBRAMYCIN) 100 MG capsule Take 1 capsule (100 mg total) by mouth 2 (two) times daily. 12/25/19   Triplett, Tammy, PA-C  DULoxetine (CYMBALTA) 60 MG capsule Take 60 mg by mouth daily.     [provider]  gabapentin (NEURONTIN) 100 MG capsule Take 100 mg by mouth 3 (three) times daily. 10/13/19   [provider]  hydrALAZINE (APRESOLINE) 25 MG tablet TAKE ONE TABLET BY MOUTH THREE TIMES DAILY 12/02/19   Verta Ellen., NP  lidocaine-prilocaine (EMLA) cream Apply 1 application topically as needed (before dialysis).     [provider]  methocarbamol (ROBAXIN) 500 MG tablet Take 500 mg by mouth 2 (two) times daily as needed. 10/13/19   [provider]  multivitamin (RENA-VIT) TABS tablet Take 1 tablet by mouth daily.    [provider]  nicotine (NICODERM CQ - DOSED IN MG/24 HR) 7 mg/24hr patch Place 7 mg onto the skin daily. 10/13/19   [provider]  nitroGLYCERIN (NITROSTAT) 0.4 MG SL tablet Place 1 tablet (0.4 mg total) under the tongue every 5 (five) minutes x 3 doses as needed for chest pain (If no relief after 3rd dose, proceed to the ED for an evaluation). I 04/12/18   Herminio Commons, MD  ondansetron (ZOFRAN) 4 MG tablet Take 4 mg by mouth every 8 (eight) hours as needed for nausea or vomiting.    [provider]  oxyCODONE (OXY IR/ROXICODONE) 5 MG immediate release tablet Take 5 mg by mouth every 4 (four) hours as needed. 10/17/19   [provider]  OXYGEN Inhale 3.5 L into the lungs continuous.     [provider]  polyethylene glycol (MIRALAX / GLYCOLAX) 17 g packet Take 17 g by mouth daily.    [provider]  predniSONE (STERAPRED UNI-PAK 21 TAB) 10 MG (21) TBPK tablet Take by mouth daily. Take 6 tabs by mouth daily  for 2 days, then 5 tabs for 2 days, then 4 tabs for 2 days, then 3 tabs for 2 days, 2 tabs for 2 days, then 1 tab by mouth daily for 2 days 12/14/19   Stacey Drain, [place], PA-C  sucralfate (CARAFATE) 1 g tablet Take 1 g by mouth 3 (three) times daily.    [provider]  tamsulosin (FLOMAX) 0.4 MG CAPS capsule Take 0.4 mg by mouth daily. 10/13/19   [provider]  traMADol (ULTRAM) 50 MG tablet Take 50 mg by mouth 2 (two) times daily as needed. 09/06/19   [provider]  triamcinolone (NASACORT ALLERGY 24HR) 55 MCG/ACT AERO nasal inhaler Place 2 sprays into the nose daily.    [provider]    Allergies    Morphine and related and Varenicline  Review of Systems   Review of Systems   Constitutional: Positive for fatigue. Negative for chills and fever.  HENT: Positive for congestion and rhinorrhea. Negative for sore throat.   Eyes: Negative.   Respiratory: Positive for cough and shortness of breath. Negative for chest tightness.   Cardiovascular: Positive for leg swelling. Negative for chest pain and palpitations.  Gastrointestinal: Negative for abdominal pain, nausea and vomiting.  Genitourinary: Negative.   Musculoskeletal: Negative for arthralgias, joint swelling and neck pain.  Skin: Positive for wound. Negative for rash.  Neurological: Positive for weakness. Negative for dizziness, light-headedness, numbness and headaches.  Psychiatric/Behavioral: Positive for confusion.    Physical Exam Updated Vital Signs BP (!) 148/41   Pulse 62   Temp 97.6 F (36.4 C) (Oral)   Resp 19   Ht 5\' 11"  (1.803 m)   Wt (!) 108.9 kg   SpO2 97%   BMI 33.47 kg/m   Physical Exam Vitals and nursing note reviewed.  Constitutional:      Appearance: He is well-developed.  HENT:     Head: Normocephalic and atraumatic.     Right Ear: Tympanic membrane normal.     Left Ear: Tympanic membrane normal.     Mouth/Throat:     Pharynx: Oropharynx is clear.  Eyes:     Conjunctiva/sclera: Conjunctivae normal.  Cardiovascular:     Rate and Rhythm: Normal rate and regular rhythm.     Heart sounds: Normal heart sounds.  Pulmonary:     Effort: Pulmonary effort is normal.     Breath sounds: No decreased air movement. Rales present. No  decreased breath sounds, wheezing or rhonchi.     Comments: Trace lower rales.   Abdominal:     General: Bowel sounds are normal.     Palpations: Abdomen is soft. There is no mass.     Tenderness: There is no abdominal tenderness. There is no guarding.  Musculoskeletal:        General: Normal range of motion.     Cervical back: Normal range of motion.  Skin:    General: Skin is warm and dry.     Findings: Wound present.     Comments: 2 cm wound left  mid anterior lower leg with white central eschar and scant clear drainage.  Erythema immediately surrounding the wound without spreading cellulitis.  Left leg is significantly more edematous than the right.  Calf nontender.   Neurological:     Mental Status: He is alert.     ED Results / Procedures / Treatments   Labs (all labs ordered are listed, but only abnormal results are displayed) Labs Reviewed  BASIC METABOLIC PANEL - Abnormal; Notable for the following components:      Result Value   Sodium 130 (*)    Chloride 91 (*)    Glucose, Bld 216 (*)    Creatinine, Ser 2.79 (*)    Calcium 7.7 (*)    GFR calc non Af Amer 23 (*)    GFR calc Af Amer 27 (*)    All other components within normal limits  CBC - Abnormal; Notable for the following components:   WBC 14.5 (*)    RBC 2.92 (*)    Hemoglobin 8.2 (*)    HCT 27.3 (*)    RDW 15.9 (*)    All other components within normal limits  HEPATIC FUNCTION PANEL - Abnormal; Notable for the following components:   Albumin 2.7 (*)    AST 46 (*)    ALT 146 (*)    Alkaline Phosphatase 237 (*)    Total Bilirubin 1.5 (*)    Bilirubin, Direct 0.6 (*)    All other components within normal limits  TROPONIN I (HIGH SENSITIVITY) - Abnormal; Notable for the following components:   Troponin I (High Sensitivity) 283 (*)    All other components within normal limits  CULTURE, BLOOD (ROUTINE X 2)  CULTURE, BLOOD (ROUTINE X 2)  SARS CORONAVIRUS 2 BY RT PCR (HOSPITAL ORDER, Hand LAB)  LACTIC ACID, PLASMA  TROPONIN I (HIGH SENSITIVITY)    EKG EKG Interpretation  Date/Time:  Monday December 29 2019 17:17:53 EDT Ventricular Rate:  58 PR Interval:  164 QRS Duration: 120 QT Interval:  522 QTC Calculation: 512 R Axis:   0 Text Interpretation: Sinus bradycardia with Premature atrial complexes with Abberant conduction Possible Left atrial enlargement Left ventricular hypertrophy with QRS widening and repolarization  abnormality ( R in aVL , Cornell product , Romhilt-Estes ) Inferior infarct , age undetermined Abnormal ECG Confirmed by Noemi Chapel [phone]) on 12/29/2019 9:36:18 PM   Radiology DG Chest 2 View  Result Date: 12/29/2019 CLINICAL DATA:  Constipation and shortness of breath for EXAM: CHEST - 2 VIEW COMPARISON:  December 25, 2019 FINDINGS: There is mild cardiomegaly. There is prominence of the central pulmonary vasculature. Small bilateral pleural effusions are seen, slightly improved from the prior exam. However there remains increased patchy/interstitial opacities seen at both lung bases. No acute osseous abnormality. IMPRESSION: Pulmonary vascular congestion and increased interstitial patchy airspace opacities at both lung bases, not significantly changed since  the prior exam which could represent pulmonary edema and/or infectious etiology. Small bilateral pleural effusions. Electronically Signed   By: Prudencio Pair M.D.   On: 12/29/2019 19:14   DG Abdomen 1 View  Result Date: 12/29/2019 CLINICAL DATA:  64 year old male with constipation. EXAM: ABDOMEN - 1 VIEW COMPARISON:  None. FINDINGS: Evaluation is limited due to body habitus. There is large amount of stool throughout the colon primarily in the right hemiabdomen. Air distended bowel loops in the left hemiabdomen, likely colonic loops. No definite small bowel dilatation or evidence of obstruction. No free air. Right upper quadrant cholecystectomy clips. Degenerative changes of the spine and lower lumbar fusion hardware. No acute osseous pathology. Small bilateral pleural effusions and bibasilar atelectasis/infiltrate. IMPRESSION: Constipation. No bowel obstruction. Electronically Signed   By: Anner Crete M.D.   On: 12/29/2019 19:10    Procedures Procedures (including critical care time)  Medications Ordered in ED Medications  iohexol (OMNIPAQUE) 350 MG/ML injection 100 mL (100 mLs Intravenous Contrast Given 12/29/19 2329)    ED Course  I  have reviewed the triage vital signs and the nursing notes.  Pertinent labs & imaging results that were available during my care of the patient were reviewed by me and considered in my medical decision making (see chart for details).    MDM Rules/Calculators/A&P                          Patient with persistent and worsening shortness of breath, generalized weakness, fatigue, reduced appetite worsening peripheral edema since last visit.  Pending Ct angio to rule out PE given sob and asymmetric lower extremity edema.     Lab results including leukocytosis but improved from last visit, also continued elevation in LFT's, but again improved.    Discussed with Dr Roxanne Mins who assumes care of pt.  New result sig elevated troponin at 283, no c/o cp. Pending delta troponin.  Advised Dr. Roxanne Mins of this result.  Final Clinical Impression(s) / ED Diagnoses Final diagnoses:  SOB (shortness of breath)  Weakness    Rx / DC Orders ED Discharge Orders    None       Landis Martins 12/29/19 2355    Fredia Sorrow, MD 01/02/20 [phone]

## 2019-12-30 DIAGNOSIS — R0602 Shortness of breath: Secondary | ICD-10-CM | POA: Diagnosis not present

## 2019-12-30 LAB — TROPONIN I (HIGH SENSITIVITY): Troponin I (High Sensitivity): 265 ng/L (ref <18)

## 2019-12-30 LAB — SARS CORONAVIRUS 2 BY RT PCR (HOSPITAL ORDER, PERFORMED IN ~~LOC~~ HOSPITAL LAB): SARS Coronavirus 2: NEGATIVE

## 2019-12-30 MED ORDER — SODIUM CHLORIDE 0.9 % IV SOLN
500.0000 mg | Freq: Once | INTRAVENOUS | Status: AC
  Administered 2019-12-30: 500 mg via INTRAVENOUS
  Filled 2019-12-30: qty 500

## 2019-12-30 MED ORDER — LEVOFLOXACIN 250 MG PO TABS
ORAL_TABLET | ORAL | 0 refills | Status: DC
Start: 2019-12-30 — End: 2020-01-10

## 2019-12-30 MED ORDER — HEPARIN BOLUS VIA INFUSION
3000.0000 [IU] | Freq: Once | INTRAVENOUS | Status: AC
  Administered 2019-12-30: 3000 [IU] via INTRAVENOUS

## 2019-12-30 MED ORDER — SODIUM CHLORIDE 0.9 % IV SOLN
2.0000 g | Freq: Once | INTRAVENOUS | Status: AC
  Administered 2019-12-30: 2 g via INTRAVENOUS
  Filled 2019-12-30: qty 20

## 2019-12-30 MED ORDER — HEPARIN (PORCINE) 25000 UT/250ML-% IV SOLN
1400.0000 [IU]/h | INTRAVENOUS | Status: DC
  Administered 2019-12-30: 1400 [IU]/h via INTRAVENOUS
  Filled 2019-12-30: qty 250

## 2019-12-30 MED ORDER — ASPIRIN 81 MG PO CHEW
324.0000 mg | CHEWABLE_TABLET | Freq: Once | ORAL | Status: AC
  Administered 2019-12-30: 324 mg via ORAL
  Filled 2019-12-30: qty 4

## 2019-12-30 NOTE — ED Notes (Signed)
Pt unable to reach [facility] to transport him home so pt called his sister to pick him up but realized that he did not have his home O2 tank with him. Offered to have pt transported via EMS but pt declined d/t cost. Pt to continue to try and reach wife for transport back home.

## 2019-12-30 NOTE — ED Provider Notes (Signed)
Care assumed from Cipriano Mile, PA-C, dialysis patient with shortness of breath pending CT angiogram of the chest to rule out pulmonary embolism.  CT angiogram actually shows evidence of bronchopneumonia involving right middle lobe, right lower lobe, left lower lobe.  This does fit his clinical presentation, so antibiotics are initiated.  Lactic acid level is normal.  Troponin has come back elevated to 283.  It is unclear how much of this it would be related to underlying renal failure.  ECG did not show any acute changes.  Will await delta troponin, but started on heparin in the meantime.  We will also give aspirin.  Delta troponin is falling, no sign that this actually represents acute coronary syndrome.  Heparin is stopped and he is discharged with prescription for levofloxacin to treat community-acquired pneumonia.  Advised to return if breathing gets worse.   Delora Fuel, MD 14/48/18 [phone]

## 2019-12-30 NOTE — Progress Notes (Signed)
ANTICOAGULATION CONSULT NOTE - Initial Consult  Pharmacy Consult for heparin Indication: chest pain/ACS  Allergies  Allergen Reactions  . Morphine And Related     Upset stomach   . Varenicline Other (See Comments)    Causes vivid dreams    Patient Measurements: Height: 5\' 11"  (180.3 cm) Weight: (!) 108.9 kg (240 lb) IBW/kg (Calculated) : 75.3 Heparin Dosing Weight: 100kg  Vital Signs: Temp: 97.6 F (36.4 C) (07/26 2138) Temp Source: Oral (07/26 2138) BP: 148/41 (07/26 2302) Pulse Rate: 62 (07/26 2302)  Labs: Recent Labs    12/29/19 1735 12/29/19 2235  HGB 8.2*  --   HCT 27.3*  --   PLT 195  --   CREATININE 2.79*  --   TROPONINIHS  --  283*    Estimated Creatinine Clearance: 33.6 mL/min (A) (by C-G formula based on SCr of 2.79 mg/dL (H)).   Medical History: Past Medical History:  Diagnosis Date  . Anxiety    occas. panic attack, takes xanax occas  . Arthritis    herniated disc, lumbar   . Back pain   . CAD (coronary artery disease)    03/25/2015 95% prox to mid LAD treated with Promus Premier DES 3.5x22mm postdilated to 4.43mm, 100% prox to distal RCA lesion filled in the PDA region via collaterals from LAD and PLA  . Cancer ([place])    - skin ca on face- removed   . CHF (congestive heart failure) ([place])   . Chronic kidney disease   . Complication of anesthesia    " it takes a long time to get over it."   . Diabetes mellitus without complication ([place])    type 2  . Dialysis patient ([place])   . Fibromyalgia   . GERD (gastroesophageal reflux disease)    otc- pepcid , approx. every other  day    . H/O acute respiratory failure   . Hematuria    being followed by Dr. Hinda Lenis for decreased kidney function   . History of blood product transfusion 02/13/2014   After having back surgery  . Hypercholesteremia   . Hypertension   . Myocardial infarction ([place]) 03/2015  . Pneumonia    "walking"  . Shortness of breath dyspnea   . Sleep apnea    test aborted, due to  not able to relax , since the aborted test he had surgery for gallbladder & he reports that he was told that he has sleep apnea     Assessment: 64yo male c/o SOB, CT negative for PE, troponin found to be elevated, to begin heparin.  Goal of Therapy:  Heparin level 0.3-0.7 units/ml Monitor platelets by anticoagulation protocol: Yes   Plan:  Will give heparin 3000 units IV bolus x1 followed by gtt at 1400 units/hr and monitor heparin levels and CBC.  Wynona Neat, PharmD, BCPS  12/30/2019,12:11 AM

## 2019-12-30 NOTE — ED Notes (Signed)
Pt discharged home.

## 2019-12-30 NOTE — Discharge Instructions (Addendum)
Return if your breathing is getting worse.

## 2020-01-03 LAB — CULTURE, BLOOD (ROUTINE X 2)
Culture: NO GROWTH
Culture: NO GROWTH
Special Requests: ADEQUATE
Special Requests: ADEQUATE

## 2020-01-09 ENCOUNTER — Emergency Department (HOSPITAL_COMMUNITY): Payer: Medicare Other

## 2020-01-09 ENCOUNTER — Other Ambulatory Visit: Payer: Self-pay

## 2020-01-09 ENCOUNTER — Inpatient Hospital Stay (HOSPITAL_COMMUNITY)
Admission: EM | Admit: 2020-01-09 | Discharge: 2020-01-11 | DRG: 291 | Disposition: A | Discharge status: Home / Self Care | Payer: Medicare Other | Attending: Internal Medicine | Admitting: Internal Medicine

## 2020-01-09 ENCOUNTER — Encounter (HOSPITAL_COMMUNITY): Payer: Self-pay | Admitting: Emergency Medicine

## 2020-01-09 DIAGNOSIS — I5023 Acute on chronic systolic (congestive) heart failure: Secondary | ICD-10-CM | POA: Diagnosis present

## 2020-01-09 DIAGNOSIS — Z6830 Body mass index (BMI) 30.0-30.9, adult: Secondary | ICD-10-CM

## 2020-01-09 DIAGNOSIS — E1122 Type 2 diabetes mellitus with diabetic chronic kidney disease: Secondary | ICD-10-CM | POA: Diagnosis present

## 2020-01-09 DIAGNOSIS — F410 Panic disorder [episodic paroxysmal anxiety] without agoraphobia: Secondary | ICD-10-CM | POA: Diagnosis present

## 2020-01-09 DIAGNOSIS — I2510 Atherosclerotic heart disease of native coronary artery without angina pectoris: Secondary | ICD-10-CM | POA: Diagnosis present

## 2020-01-09 DIAGNOSIS — R0609 Other forms of dyspnea: Secondary | ICD-10-CM

## 2020-01-09 DIAGNOSIS — R0600 Dyspnea, unspecified: Secondary | ICD-10-CM

## 2020-01-09 DIAGNOSIS — F17210 Nicotine dependence, cigarettes, uncomplicated: Secondary | ICD-10-CM | POA: Diagnosis present

## 2020-01-09 DIAGNOSIS — N186 End stage renal disease: Secondary | ICD-10-CM

## 2020-01-09 DIAGNOSIS — Z992 Dependence on renal dialysis: Secondary | ICD-10-CM

## 2020-01-09 DIAGNOSIS — E8770 Fluid overload, unspecified: Secondary | ICD-10-CM | POA: Diagnosis present

## 2020-01-09 DIAGNOSIS — D631 Anemia in chronic kidney disease: Secondary | ICD-10-CM | POA: Diagnosis present

## 2020-01-09 DIAGNOSIS — F419 Anxiety disorder, unspecified: Secondary | ICD-10-CM | POA: Diagnosis present

## 2020-01-09 DIAGNOSIS — G4730 Sleep apnea, unspecified: Secondary | ICD-10-CM | POA: Diagnosis present

## 2020-01-09 DIAGNOSIS — M797 Fibromyalgia: Secondary | ICD-10-CM | POA: Diagnosis present

## 2020-01-09 DIAGNOSIS — R0902 Hypoxemia: Secondary | ICD-10-CM | POA: Diagnosis present

## 2020-01-09 DIAGNOSIS — E8889 Other specified metabolic disorders: Secondary | ICD-10-CM | POA: Diagnosis present

## 2020-01-09 DIAGNOSIS — G8929 Other chronic pain: Secondary | ICD-10-CM | POA: Diagnosis present

## 2020-01-09 DIAGNOSIS — Z888 Allergy status to other drugs, medicaments and biological substances status: Secondary | ICD-10-CM

## 2020-01-09 DIAGNOSIS — I252 Old myocardial infarction: Secondary | ICD-10-CM

## 2020-01-09 DIAGNOSIS — J9621 Acute and chronic respiratory failure with hypoxia: Secondary | ICD-10-CM | POA: Diagnosis present

## 2020-01-09 DIAGNOSIS — I132 Hypertensive heart and chronic kidney disease with heart failure and with stage 5 chronic kidney disease, or end stage renal disease: Secondary | ICD-10-CM | POA: Diagnosis not present

## 2020-01-09 DIAGNOSIS — K219 Gastro-esophageal reflux disease without esophagitis: Secondary | ICD-10-CM | POA: Diagnosis present

## 2020-01-09 DIAGNOSIS — J810 Acute pulmonary edema: Secondary | ICD-10-CM

## 2020-01-09 DIAGNOSIS — R001 Bradycardia, unspecified: Secondary | ICD-10-CM | POA: Diagnosis present

## 2020-01-09 DIAGNOSIS — E871 Hypo-osmolality and hyponatremia: Secondary | ICD-10-CM | POA: Diagnosis present

## 2020-01-09 DIAGNOSIS — E669 Obesity, unspecified: Secondary | ICD-10-CM | POA: Diagnosis present

## 2020-01-09 DIAGNOSIS — M1990 Unspecified osteoarthritis, unspecified site: Secondary | ICD-10-CM | POA: Diagnosis present

## 2020-01-09 DIAGNOSIS — Z800 Family history of malignant neoplasm of digestive organs: Secondary | ICD-10-CM

## 2020-01-09 DIAGNOSIS — Z8249 Family history of ischemic heart disease and other diseases of the circulatory system: Secondary | ICD-10-CM

## 2020-01-09 DIAGNOSIS — R490 Dysphonia: Secondary | ICD-10-CM | POA: Diagnosis present

## 2020-01-09 DIAGNOSIS — M5126 Other intervertebral disc displacement, lumbar region: Secondary | ICD-10-CM | POA: Diagnosis present

## 2020-01-09 DIAGNOSIS — I10 Essential (primary) hypertension: Secondary | ICD-10-CM | POA: Diagnosis present

## 2020-01-09 DIAGNOSIS — Z885 Allergy status to narcotic agent status: Secondary | ICD-10-CM

## 2020-01-09 DIAGNOSIS — Z9981 Dependence on supplemental oxygen: Secondary | ICD-10-CM

## 2020-01-09 DIAGNOSIS — Z20822 Contact with and (suspected) exposure to covid-19: Secondary | ICD-10-CM | POA: Diagnosis present

## 2020-01-09 DIAGNOSIS — Z8042 Family history of malignant neoplasm of prostate: Secondary | ICD-10-CM

## 2020-01-09 DIAGNOSIS — E785 Hyperlipidemia, unspecified: Secondary | ICD-10-CM | POA: Diagnosis present

## 2020-01-09 DIAGNOSIS — E1169 Type 2 diabetes mellitus with other specified complication: Secondary | ICD-10-CM | POA: Diagnosis present

## 2020-01-09 LAB — CBC WITH DIFFERENTIAL/PLATELET
Abs Immature Granulocytes: 0.02 10*3/uL (ref 0.00–0.07)
Basophils Absolute: 0.1 10*3/uL (ref 0.0–0.1)
Basophils Relative: 1 %
Eosinophils Absolute: 0.1 10*3/uL (ref 0.0–0.5)
Eosinophils Relative: 1 %
HCT: 26 % — ABNORMAL LOW (ref 39.0–52.0)
Hemoglobin: 7.7 g/dL — ABNORMAL LOW (ref 13.0–17.0)
Immature Granulocytes: 0 %
Lymphocytes Relative: 7 %
Lymphs Abs: 0.5 10*3/uL — ABNORMAL LOW (ref 0.7–4.0)
MCH: 27.1 pg (ref 26.0–34.0)
MCHC: 29.6 g/dL — ABNORMAL LOW (ref 30.0–36.0)
MCV: 91.5 fL (ref 80.0–100.0)
Monocytes Absolute: 0.4 10*3/uL (ref 0.1–1.0)
Monocytes Relative: 6 %
Neutro Abs: 6.3 10*3/uL (ref 1.7–7.7)
Neutrophils Relative %: 85 %
Platelets: 224 10*3/uL (ref 150–400)
RBC: 2.84 MIL/uL — ABNORMAL LOW (ref 4.22–5.81)
RDW: 15.8 % — ABNORMAL HIGH (ref 11.5–15.5)
WBC: 7.4 10*3/uL (ref 4.0–10.5)
nRBC: 0 % (ref 0.0–0.2)

## 2020-01-09 LAB — COMPREHENSIVE METABOLIC PANEL
ALT: 16 U/L (ref 0–44)
AST: 13 U/L — ABNORMAL LOW (ref 15–41)
Albumin: 2.7 g/dL — ABNORMAL LOW (ref 3.5–5.0)
Alkaline Phosphatase: 355 U/L — ABNORMAL HIGH (ref 38–126)
Anion gap: 12 (ref 5–15)
BUN: 29 mg/dL — ABNORMAL HIGH (ref 8–23)
CO2: 26 mmol/L (ref 22–32)
Calcium: 8 mg/dL — ABNORMAL LOW (ref 8.9–10.3)
Chloride: 90 mmol/L — ABNORMAL LOW (ref 98–111)
Creatinine, Ser: 4.75 mg/dL — ABNORMAL HIGH (ref 0.61–1.24)
GFR calc Af Amer: 14 mL/min — ABNORMAL LOW (ref >60)
GFR calc non Af Amer: 12 mL/min — ABNORMAL LOW (ref >60)
Glucose, Bld: 184 mg/dL — ABNORMAL HIGH (ref 70–99)
Potassium: 5 mmol/L (ref 3.5–5.1)
Sodium: 128 mmol/L — ABNORMAL LOW (ref 135–145)
Total Bilirubin: 0.9 mg/dL (ref 0.3–1.2)
Total Protein: 7 g/dL (ref 6.5–8.1)

## 2020-01-09 LAB — HEPATITIS B SURFACE ANTIGEN: Hepatitis B Surface Ag: NONREACTIVE

## 2020-01-09 LAB — TROPONIN I (HIGH SENSITIVITY): Troponin I (High Sensitivity): 29 ng/L — ABNORMAL HIGH (ref <18)

## 2020-01-09 LAB — SARS CORONAVIRUS 2 BY RT PCR (HOSPITAL ORDER, PERFORMED IN ~~LOC~~ HOSPITAL LAB): SARS Coronavirus 2: NEGATIVE

## 2020-01-09 LAB — BRAIN NATRIURETIC PEPTIDE: B Natriuretic Peptide: 4023 pg/mL — ABNORMAL HIGH (ref 0.0–100.0)

## 2020-01-09 MED ORDER — SODIUM CHLORIDE 0.9 % IV SOLN: 100.0000 mL | INTRAVENOUS | Status: DC | PRN

## 2020-01-09 MED ORDER — ACETAMINOPHEN 650 MG RE SUPP: 650.0000 mg | Freq: Four times a day (QID) | RECTAL | Status: DC | PRN

## 2020-01-09 MED ORDER — ACETAMINOPHEN 325 MG PO TABS: 650.0000 mg | ORAL_TABLET | Freq: Four times a day (QID) | ORAL | Status: DC | PRN

## 2020-01-09 MED ORDER — OXYCODONE-ACETAMINOPHEN 5-325 MG PO TABS
1.0000 | ORAL_TABLET | Freq: Once | ORAL | Status: AC
  Administered 2020-01-09: 1 via ORAL
  Filled 2020-01-09: qty 1

## 2020-01-09 MED ORDER — INSULIN ASPART 100 UNIT/ML ~~LOC~~ SOLN
0.0000 [IU] | Freq: Three times a day (TID) | SUBCUTANEOUS | Status: DC
  Administered 2020-01-10: 2 [IU] via SUBCUTANEOUS
  Administered 2020-01-10: 1 [IU] via SUBCUTANEOUS
  Administered 2020-01-10: 2 [IU] via SUBCUTANEOUS
  Administered 2020-01-11: 1 [IU] via SUBCUTANEOUS
  Administered 2020-01-11: 3 [IU] via SUBCUTANEOUS
  Administered 2020-01-11: 2 [IU] via SUBCUTANEOUS

## 2020-01-09 MED ORDER — ONDANSETRON HCL 4 MG PO TABS: 4.0000 mg | ORAL_TABLET | Freq: Four times a day (QID) | ORAL | Status: DC | PRN

## 2020-01-09 MED ORDER — LIDOCAINE-PRILOCAINE 2.5-2.5 % EX CREA: 1.0000 "application " | TOPICAL_CREAM | CUTANEOUS | Status: DC | PRN

## 2020-01-09 MED ORDER — CHLORHEXIDINE GLUCONATE CLOTH 2 % EX PADS
6.0000 | MEDICATED_PAD | Freq: Every day | CUTANEOUS | Status: DC
  Administered 2020-01-10 – 2020-01-11 (×2): 6 via TOPICAL

## 2020-01-09 MED ORDER — PENTAFLUOROPROP-TETRAFLUOROETH EX AERO: 1.0000 "application " | INHALATION_SPRAY | CUTANEOUS | Status: DC | PRN

## 2020-01-09 MED ORDER — ONDANSETRON HCL 4 MG/2ML IJ SOLN: 4.0000 mg | Freq: Four times a day (QID) | INTRAMUSCULAR | Status: DC | PRN

## 2020-01-09 MED ORDER — LIDOCAINE HCL (PF) 1 % IJ SOLN: 5.0000 mL | INTRAMUSCULAR | Status: DC | PRN

## 2020-01-09 MED ORDER — HEPARIN SODIUM (PORCINE) 1000 UNIT/ML DIALYSIS
20.0000 [IU]/kg | INTRAMUSCULAR | Status: DC | PRN
  Filled 2020-01-09: qty 3

## 2020-01-09 NOTE — ED Notes (Signed)
Dialysis remains at bedside

## 2020-01-09 NOTE — Progress Notes (Signed)
Got a call about this patient -  [facility] MWF-  Did not go to HD today - came to ER instead due to SOB-  Mostly upon exertion-  req up titration of his home o2 dose.    In ER- K is 5... BNP is elevated - CXR with cardiomegaly/pulmonary venous congestion and bilat pleural effusions-  Left greater than right. Spoke with the EDP and plan was put in place to try to do dialysis in the ER with volume removal and if sxms improved could be discharged to resume his regular OP schedule.  Made contact with dialysis nurse at 3:54 PM    Mark [name]

## 2020-01-09 NOTE — ED Notes (Signed)
Dialysis at bedside

## 2020-01-09 NOTE — ED Notes (Signed)
Dialysis complete

## 2020-01-09 NOTE — ED Provider Notes (Signed)
Emergency Department Provider Note   I have reviewed the triage vital signs and the nursing notes.   HISTORY  Chief Complaint Shortness of Breath   HPI Mark [name] is a 64 y.o. male with past medical history reviewed below including ESRD (MWF) and chronic respiratory failure on 3L  O2 at baseline presents to the ED for evaluation of exertional dyspnea.  Patient has had intermittent and progressively worsening symptoms over the past couple of weeks after completing a course of antibiotics for bacterial pneumonia.  Patient finished his antibiotics on Monday of this week and states that overall he is feeling better.  This morning, he was walking into the kitchen when he became very dyspneic.  He had to stop and bend over to rest for approximately 10 minutes before he could continue onto the living room.  He may to the chair but again seemed very winded.  Patient's wife turned up his nasal cannula oxygen temporarily and then he was able to return to his normal settings.  He did not experience chest, back, abdomen pain.  Ultimately, EMS was called and transported the patient to the emergency department.  Patient was due for dialysis this morning but with his significant dyspnea he did not present initially.  He notes that he is currently below his dry weight after more fluid was taken off over the past several sessions.  He goes to [employer] in [place]. He has been vaccinated for COVID.   Past Medical History:  Diagnosis Date  . Anxiety    occas. panic attack, takes xanax occas  . Arthritis    herniated disc, lumbar   . Back pain   . CAD (coronary artery disease)    03/25/2015 95% prox to mid LAD treated with Promus Premier DES 3.5x79mm postdilated to 4.79mm, 100% prox to distal RCA lesion filled in the PDA region via collaterals from LAD and PLA  . Cancer ([place])    - skin ca on face- removed   . CHF (congestive heart failure) ([place])   . Chronic kidney disease   . Complication of anesthesia     " it takes a Joelly Bolanos time to get over it."   . Diabetes mellitus without complication ([place])    type 2  . Dialysis patient ([place])   . Fibromyalgia   . GERD (gastroesophageal reflux disease)    otc- pepcid , approx. every other  day    . H/O acute respiratory failure   . Hematuria    being followed by Dr. Hinda Lenis for decreased kidney function   . History of blood product transfusion 02/13/2014   After having back surgery  . Hypercholesteremia   . Hypertension   . Myocardial infarction ([place]) 03/2015  . Pneumonia    "walking"  . Shortness of breath dyspnea   . Sleep apnea    test aborted, due to not able to relax , since the aborted test he had surgery for gallbladder & he reports that he was told that he has sleep apnea     Patient Active Problem List   Diagnosis Date Noted  . Positive colorectal cancer screening using Cologuard test 02/11/2018  . Ischemic cardiomyopathy   . Bacteremia   . Anxiety state 04/06/2016  . Diabetes mellitus type 2 in obese ([place]) 04/06/2016  . Bacteremia associated with intravascular line ([place]) 04/04/2016  . Abnormal stress test 03/25/2015  . Abnormal nuclear stress test   . Cardiomyopathy ([place])   . Chronic systolic heart failure ([place])   .  ESRD (end stage renal disease) on dialysis ([place])   . Pre-operative cardiovascular examination   . Positive cardiac stress test 03/24/2015  . Essential hypertension   . Acute respiratory failure ([place]) 02/12/2015  . Renal failure   . Hyperkalemia   . Chronic kidney disease (CKD), stage IV (severe) ([place]) 01/22/2015  . HNP (herniated nucleus pulposus), lumbar 02/13/2014  . Abnormal ECG 02/12/2014  . HTN (hypertension) 02/12/2014  . Bradycardia 02/12/2014    Past Surgical History:  Procedure Laterality Date  . A/V FISTULAGRAM Left 10/31/2016   Procedure: A/V Fistulagram;  Surgeon: Serafina Mitchell, MD;  Location: [facility];  Service: Cardiovascular;  Laterality: Left;  . AV FISTULA PLACEMENT Left  02/02/2015   Procedure: LEFT ARM RADIOCEPHALIC ARTERIOVENOUS (AV) FISTULA CREATION;  Surgeon: Conrad Trafalgar, MD;  Location: [facility];  Service: Vascular;  Laterality: Left;  . AV FISTULA PLACEMENT Left 05/25/2016   Procedure: ARTERIOVENOUS (AV) FISTULA CREATION;  Surgeon: [name], MD;  Location: [facility];  Service: Vascular;  Laterality: Left;  . BACK SURGERY  02/13/14  . BIOPSY  03/08/2018   Procedure: BIOPSY;  Surgeon: Rogene Houston, MD;  [facility];  Service: Endoscopy;;  gastric  . CARDIAC CATHETERIZATION N/A 03/25/2015   Procedure: Left Heart Cath and Coronary Angiography;  Surgeon: Leonie Man, MD;  Location: [facility];  Service: Cardiovascular;  Laterality: N/A;  . CARDIAC CATHETERIZATION N/A 03/25/2015   Procedure: Coronary Stent Intervention;  Surgeon: Leonie Man, MD;  Location: [facility];  Service: Cardiovascular;  Laterality: N/A;  . CHOLECYSTECTOMY    . COLONOSCOPY    . COLONOSCOPY WITH PROPOFOL N/A 03/08/2018   Procedure: COLONOSCOPY WITH PROPOFOL;  Surgeon: Rogene Houston, MD;  [facility];  Service: Endoscopy;  Laterality: N/A;  11:10  . EYE SURGERY     cataracts remove, bilateral, w/IOL.  Marland Kitchen INSERTION OF DIALYSIS CATHETER N/A 03/09/2015   Procedure: INSERTION OF DIALYSIS CATHETER;  Surgeon: Conrad Mullica Hill, MD;  Location: [facility];  Service: Vascular;  Laterality: N/A;  . INSERTION OF DIALYSIS CATHETER Left 04/07/2016   Procedure: INSERTION OF TUNNELED DIALYSIS CATHETER LEFT INTERNAL JUGULAR, ATTEMPTED INSERTION OF TUNNELLED CATHETER RIGHT INTERNAL JUGULAR;  Surgeon: [name], MD;  [facility];  Service: Vascular;  Laterality: Left;  . IR GENERIC HISTORICAL  07/28/2016   IR FLUORO GUIDE CV LINE LEFT 07/28/2016 Greggory Keen, MD [facility]  . IR REMOVAL TUN CV CATH W/O FL  03/28/2017  . LUMBAR LAMINECTOMY/DECOMPRESSION MICRODISCECTOMY Right 02/13/2014   Procedure: RIGHT LUMBAR FOUR-FIVE, RIGHT LUMBAR FIVE- SACRAL ONE  LUMBAR LAMINECTOMY/DECOMPRESSION MICRODISCECTOMY ;  Surgeon: Ashok Pall, MD;  Location: [facility];  Service: Neurosurgery;  Laterality: Right;  Right L4-5 and Right L5-S1 diskectomies  . PERIPHERAL VASCULAR BALLOON ANGIOPLASTY Left 10/31/2016   Procedure: Peripheral Vascular Balloon Angioplasty;  Surgeon: Serafina Mitchell, MD;  Location: [facility];  Service: Cardiovascular;  Laterality: Left;  AV fistula  . PERIPHERAL VASCULAR BALLOON ANGIOPLASTY Left 01/28/2019   Procedure: PERIPHERAL VASCULAR BALLOON ANGIOPLASTY;  Surgeon: Serafina Mitchell, MD;  Location: [facility];  Service: Cardiovascular;  Laterality: Left;  arm fistula  . TEE WITHOUT CARDIOVERSION N/A 05/02/2016   Procedure: TRANSESOPHAGEAL ECHOCARDIOGRAM (TEE);  Surgeon: Herminio Commons, MD;  [facility];  Service: Cardiovascular;  Laterality: N/A;  . TONSILLECTOMY      Allergies Morphine and related and Varenicline  Family History  Adopted:  Yes  Problem Relation Age of Onset  . Prostate cancer Father   . Cancer Father        prostate  . Heart failure Maternal Grandmother   . Heart attack Maternal Grandfather   . Colon cancer Maternal Grandfather     Social History Social History   Tobacco Use  . Smoking status: Current Every Day Smoker    Packs/day: 0.50    Years: 40.00    Pack years: 20.00    Types: Cigarettes    Start date: 10/17/1973    Last attempt to quit: 02/07/2015    Years since quitting: 4.9  . Smokeless tobacco: Never Used  Vaping Use  . Vaping Use: Never used  Substance Use Topics  . Alcohol use: No    Alcohol/week: 0.0 standard drinks  . Drug use: No    Review of Systems  Constitutional: No fever/chills Eyes: No visual changes. ENT: No sore throat. Cardiovascular: Denies chest pain. Respiratory: Positive exertional shortness of breath. Gastrointestinal: No abdominal pain.  No nausea, no vomiting.  No diarrhea.  No constipation. Genitourinary: Negative for  dysuria. Musculoskeletal: Negative for back pain. Skin: Negative for rash. Neurological: Negative for headaches, focal weakness or numbness.  10-point ROS otherwise negative.  ____________________________________________   PHYSICAL EXAM:  VITAL SIGNS: ED Triage Vitals  Enc Vitals Group     BP 01/09/20 0908 (!) 166/98     Pulse Rate 01/09/20 0911 90     Resp 01/09/20 0908 (!) 26     Temp 01/09/20 0911 98.6 F (37 C)     Temp Source 01/09/20 0911 Oral     SpO2 01/09/20 0911 90 %     Weight 01/09/20 0907 226 lb (102.5 kg)     Height 01/09/20 0907 5\' 11"  (1.803 m)   Constitutional: Alert and oriented. Well appearing and in no acute distress. Eyes: Conjunctivae are normal. Head: Atraumatic. Nose: No congestion/rhinnorhea. Mouth/Throat: Mucous membranes are moist.   Neck: No stridor. Cardiovascular: Normal rate, regular rhythm. Good peripheral circulation. 3/6 systolic murmur appreciated.  Respiratory: Normal respiratory effort.  No retractions. Lungs CTAB. Gastrointestinal: Soft and nontender. No distention.  Musculoskeletal: No lower extremity tenderness nor edema. No gross deformities of extremities. Neurologic:  Normal speech and language. No gross focal neurologic deficits are appreciated.  Skin:  Skin is warm, dry and intact. No rash noted.   ____________________________________________   LABS (all labs ordered are listed, but only abnormal results are displayed)  Labs Reviewed  COMPREHENSIVE METABOLIC PANEL - Abnormal; Notable for the following components:      Result Value   Sodium 128 (*)    Chloride 90 (*)    Glucose, Bld 184 (*)    BUN 29 (*)    Creatinine, Ser 4.75 (*)    Calcium 8.0 (*)    Albumin 2.7 (*)    AST 13 (*)    Alkaline Phosphatase 355 (*)    GFR calc non Af Amer 12 (*)    GFR calc Af Amer 14 (*)    All other components within normal limits  BRAIN NATRIURETIC PEPTIDE - Abnormal; Notable for the following components:   B Natriuretic Peptide  4,023.0 (*)    All other components within normal limits  CBC WITH DIFFERENTIAL/PLATELET - Abnormal; Notable for the following components:   RBC 2.84 (*)    Hemoglobin 7.7 (*)    HCT 26.0 (*)    MCHC 29.6 (*)    RDW 15.8 (*)    Lymphs Abs  0.5 (*)    All other components within normal limits  TROPONIN I (HIGH SENSITIVITY) - Abnormal; Notable for the following components:   Troponin I (High Sensitivity) 29 (*)    All other components within normal limits  SARS CORONAVIRUS 2 BY RT PCR [facility] ORDER, [place] [facility])   ____________________________________________  EKG   EKG Interpretation  Date/Time:  Friday January 09 2020 09:06:50 EDT Ventricular Rate:  96 PR Interval:    QRS Duration: 143 QT Interval:  379 QTC Calculation: 479 R Axis:   22 Text Interpretation: Sinus rhythm Prolonged PR interval Probable left atrial enlargement IVCD, consider atypical LBBB No STEMI Confirmed by Nanda Quinton [phone]) on 01/09/2020 9:44:35 AM       ____________________________________________  RADIOLOGY  DG Chest Portable 1 View  Result Date: 01/09/2020 CLINICAL DATA:  Shortness of breath. EXAM: PORTABLE CHEST 1 VIEW COMPARISON:  CT chest 12/29/2019.  Chest x-ray 12/29/2019. FINDINGS: Severe cardiomegaly again noted. Pulmonary venous congestion. Bilateral interstitial prominence and bilateral pleural effusions. Findings consistent with CHF. Pneumonitis cannot be excluded. No pneumothorax. Degenerative change thoracic spine. IMPRESSION: Severe cardiomegaly again noted. Pulmonary venous congestion, bilateral interstitial prominence, bilateral pleural effusions are noted consistent with CHF. Pneumonitis cannot be excluded Electronically Signed   By: Marcello Moores  Register   On: 01/09/2020 09:46    ____________________________________________   PROCEDURES  Procedure(s) performed:   Procedures  CRITICAL CARE Performed by: Margette Fast Total critical care time: 35  minutes Critical care time was exclusive of separately billable procedures and treating other patients. Critical care was necessary to treat or prevent imminent or life-threatening deterioration. Critical care was time spent personally by me on the following activities: development of treatment plan with patient and/or surrogate as well as nursing, discussions with consultants, evaluation of patient's response to treatment, examination of patient, obtaining history from patient or surrogate, ordering and performing treatments and interventions, ordering and review of laboratory studies, ordering and review of radiographic studies, pulse oximetry and re-evaluation of patient's condition.  Nanda Quinton, MD Emergency Medicine  ____________________________________________   INITIAL IMPRESSION / ASSESSMENT AND PLAN / ED COURSE  Pertinent labs & imaging results that were available during my care of the patient were reviewed by me and considered in my medical decision making (see chart for details).   Patient presents emergency department for evaluation of shortness of breath.  Patient recently scanned for PE in late July at which point a pneumonia was found.  He is completed antibiotics.  No significant findings on lung exam.  Patient has a baseline systolic murmur noted.  He does not appear volume overloaded and in fact appears very dry.  Plan for chest x-ray along with serial troponins and Covid testing.  Currently at his baseline oxygen.  Labs and imaging reviewed. CXR with effusions and vascular congestion. Patient's BNP is elevated despite appearing on the dry side clinically. Spoke with Nephrology who will arrange for HD in the ED today. Will ambulate after HD and reassess for possible discharge at that time. Patient is comfortable and well-appearing here on his home O2 at rest.   Care transferred to Dr. Rogene Houston.  ____________________________________________  FINAL CLINICAL IMPRESSION(S) / ED  DIAGNOSES  Final diagnoses:  Exertional dyspnea  Acute pulmonary edema ([place])    MEDICATIONS GIVEN DURING THIS VISIT:  Medications  oxyCODONE-acetaminophen (PERCOCET/ROXICET) 5-325 MG per tablet 1 tablet (1 tablet Oral Given 01/09/20 1313)    Note:  This document was prepared using Dragon voice recognition software  and may include unintentional dictation errors.  Nanda Quinton, MD, [facility] Emergency Medicine    Ayumi Wangerin, Wonda Olds, MD 01/10/20 [phone]

## 2020-01-09 NOTE — ED Triage Notes (Addendum)
Pt here by [facility] for SOB with exertion; sats 88-91% on 2.5L [place], was dx with pneumonia on 08/03-pt reports he has taken all abx that were prescribed, pt reports he is on 3L [place] at home continuous and has hx of CHF, pt has dialysis on M, W and F-last went Wed, did not go this am

## 2020-01-09 NOTE — ED Notes (Signed)
Pt requesting water, tech explained that the provider must see him first before drinking or eating. Pt continued to demand a drink. During transfer from EMS gurney pt grabbed onto sink and stuck head under [place] to drink water. Nurse notified of incident. Unsure how much water was consumed

## 2020-01-09 NOTE — Procedures (Signed)
     EMERGENT HEMODIALYSIS TREATMENT NOTE:   3.5 hour low-heparin dialysis completed via LUA AVF (15g/antegrade).  Dialysis was performed in ED with hope to discharge home after.  Goal met: 3.5 liters removed without interruption in ultrafiltration.  All blood was returned and hemostasis was achieved in 20 minutes.  Rockwell Alexandria, RN

## 2020-01-09 NOTE — ED Notes (Signed)
Ambulated pt with a walker on 3L O2 via [place], pt able to ambulate to the desk, then needed to stop and rest, pt de satted down to 88%, after some rest pt O2 sat returned to 91/92%, pt returned to bed and once again pt de satted to 88% on 3L.

## 2020-01-09 NOTE — ED Notes (Addendum)
Blood drawn via 23 gauge butterfly from R hand, pt states that he has 8/10 back pain, pt requests pain med, md notified

## 2020-01-10 ENCOUNTER — Observation Stay (HOSPITAL_COMMUNITY): Payer: Medicare Other

## 2020-01-10 DIAGNOSIS — Z9981 Dependence on supplemental oxygen: Secondary | ICD-10-CM | POA: Diagnosis not present

## 2020-01-10 DIAGNOSIS — E785 Hyperlipidemia, unspecified: Secondary | ICD-10-CM | POA: Diagnosis present

## 2020-01-10 DIAGNOSIS — E669 Obesity, unspecified: Secondary | ICD-10-CM | POA: Diagnosis present

## 2020-01-10 DIAGNOSIS — I5023 Acute on chronic systolic (congestive) heart failure: Secondary | ICD-10-CM

## 2020-01-10 DIAGNOSIS — E871 Hypo-osmolality and hyponatremia: Secondary | ICD-10-CM | POA: Diagnosis present

## 2020-01-10 DIAGNOSIS — Z20822 Contact with and (suspected) exposure to covid-19: Secondary | ICD-10-CM | POA: Diagnosis present

## 2020-01-10 DIAGNOSIS — F410 Panic disorder [episodic paroxysmal anxiety] without agoraphobia: Secondary | ICD-10-CM | POA: Diagnosis present

## 2020-01-10 DIAGNOSIS — E1122 Type 2 diabetes mellitus with diabetic chronic kidney disease: Secondary | ICD-10-CM | POA: Diagnosis present

## 2020-01-10 DIAGNOSIS — G8929 Other chronic pain: Secondary | ICD-10-CM | POA: Diagnosis present

## 2020-01-10 DIAGNOSIS — F419 Anxiety disorder, unspecified: Secondary | ICD-10-CM | POA: Diagnosis present

## 2020-01-10 DIAGNOSIS — Z885 Allergy status to narcotic agent status: Secondary | ICD-10-CM | POA: Diagnosis not present

## 2020-01-10 DIAGNOSIS — M797 Fibromyalgia: Secondary | ICD-10-CM | POA: Diagnosis present

## 2020-01-10 DIAGNOSIS — R490 Dysphonia: Secondary | ICD-10-CM | POA: Diagnosis present

## 2020-01-10 DIAGNOSIS — J9621 Acute and chronic respiratory failure with hypoxia: Secondary | ICD-10-CM | POA: Diagnosis present

## 2020-01-10 DIAGNOSIS — I252 Old myocardial infarction: Secondary | ICD-10-CM | POA: Diagnosis not present

## 2020-01-10 DIAGNOSIS — M5126 Other intervertebral disc displacement, lumbar region: Secondary | ICD-10-CM | POA: Diagnosis present

## 2020-01-10 DIAGNOSIS — I5021 Acute systolic (congestive) heart failure: Secondary | ICD-10-CM

## 2020-01-10 DIAGNOSIS — I132 Hypertensive heart and chronic kidney disease with heart failure and with stage 5 chronic kidney disease, or end stage renal disease: Secondary | ICD-10-CM | POA: Diagnosis present

## 2020-01-10 DIAGNOSIS — M1990 Unspecified osteoarthritis, unspecified site: Secondary | ICD-10-CM | POA: Diagnosis present

## 2020-01-10 DIAGNOSIS — I2510 Atherosclerotic heart disease of native coronary artery without angina pectoris: Secondary | ICD-10-CM | POA: Diagnosis present

## 2020-01-10 DIAGNOSIS — D631 Anemia in chronic kidney disease: Secondary | ICD-10-CM | POA: Diagnosis present

## 2020-01-10 DIAGNOSIS — E8770 Fluid overload, unspecified: Secondary | ICD-10-CM | POA: Diagnosis present

## 2020-01-10 DIAGNOSIS — Z888 Allergy status to other drugs, medicaments and biological substances status: Secondary | ICD-10-CM | POA: Diagnosis not present

## 2020-01-10 DIAGNOSIS — Z992 Dependence on renal dialysis: Secondary | ICD-10-CM | POA: Diagnosis not present

## 2020-01-10 DIAGNOSIS — E1169 Type 2 diabetes mellitus with other specified complication: Secondary | ICD-10-CM | POA: Diagnosis not present

## 2020-01-10 DIAGNOSIS — I10 Essential (primary) hypertension: Secondary | ICD-10-CM | POA: Diagnosis not present

## 2020-01-10 DIAGNOSIS — N186 End stage renal disease: Secondary | ICD-10-CM | POA: Diagnosis present

## 2020-01-10 LAB — BASIC METABOLIC PANEL
Anion gap: 11 (ref 5–15)
BUN: 16 mg/dL (ref 8–23)
CO2: 29 mmol/L (ref 22–32)
Calcium: 8.1 mg/dL — ABNORMAL LOW (ref 8.9–10.3)
Chloride: 92 mmol/L — ABNORMAL LOW (ref 98–111)
Creatinine, Ser: 3.25 mg/dL — ABNORMAL HIGH (ref 0.61–1.24)
GFR calc Af Amer: 22 mL/min — ABNORMAL LOW (ref >60)
GFR calc non Af Amer: 19 mL/min — ABNORMAL LOW (ref >60)
Glucose, Bld: 161 mg/dL — ABNORMAL HIGH (ref 70–99)
Potassium: 4.3 mmol/L (ref 3.5–5.1)
Sodium: 132 mmol/L — ABNORMAL LOW (ref 135–145)

## 2020-01-10 LAB — GLUCOSE, CAPILLARY
Glucose-Capillary: 140 mg/dL — ABNORMAL HIGH (ref 70–99)
Glucose-Capillary: 177 mg/dL — ABNORMAL HIGH (ref 70–99)
Glucose-Capillary: 190 mg/dL — ABNORMAL HIGH (ref 70–99)
Glucose-Capillary: 144 mg/dL — ABNORMAL HIGH (ref 70–99)

## 2020-01-10 LAB — CBC
HCT: 26.7 % — ABNORMAL LOW (ref 39.0–52.0)
Hemoglobin: 7.9 g/dL — ABNORMAL LOW (ref 13.0–17.0)
MCH: 27.3 pg (ref 26.0–34.0)
MCHC: 29.6 g/dL — ABNORMAL LOW (ref 30.0–36.0)
MCV: 92.4 fL (ref 80.0–100.0)
Platelets: 226 10*3/uL (ref 150–400)
RBC: 2.89 MIL/uL — ABNORMAL LOW (ref 4.22–5.81)
RDW: 15.8 % — ABNORMAL HIGH (ref 11.5–15.5)
WBC: 5.2 10*3/uL (ref 4.0–10.5)
nRBC: 0 % (ref 0.0–0.2)

## 2020-01-10 LAB — HIV ANTIBODY (ROUTINE TESTING W REFLEX): HIV Screen 4th Generation wRfx: NONREACTIVE

## 2020-01-10 MED ORDER — HYDROMORPHONE HCL 1 MG/ML IJ SOLN
1.0000 mg | Freq: Once | INTRAMUSCULAR | Status: AC
  Administered 2020-01-10: 1 mg via INTRAVENOUS
  Filled 2020-01-10: qty 1

## 2020-01-10 MED ORDER — SUCRALFATE 1 G PO TABS
1.0000 g | ORAL_TABLET | Freq: Three times a day (TID) | ORAL | Status: DC
  Administered 2020-01-10 – 2020-01-11 (×3): 1 g via ORAL
  Filled 2020-01-10 (×3): qty 1

## 2020-01-10 MED ORDER — ASPIRIN EC 81 MG PO TBEC
81.0000 mg | DELAYED_RELEASE_TABLET | Freq: Every day | ORAL | Status: DC
  Administered 2020-01-10 – 2020-01-11 (×2): 81 mg via ORAL
  Filled 2020-01-10 (×2): qty 1

## 2020-01-10 MED ORDER — TRAMADOL HCL 50 MG PO TABS
50.0000 mg | ORAL_TABLET | Freq: Two times a day (BID) | ORAL | Status: DC | PRN
  Administered 2020-01-11: 50 mg via ORAL
  Filled 2020-01-10: qty 1

## 2020-01-10 MED ORDER — FUROSEMIDE 10 MG/ML IJ SOLN
80.0000 mg | Freq: Once | INTRAMUSCULAR | Status: AC
  Administered 2020-01-10: 80 mg via INTRAVENOUS
  Filled 2020-01-10: qty 8

## 2020-01-10 MED ORDER — POLYETHYLENE GLYCOL 3350 17 G PO PACK
17.0000 g | PACK | Freq: Every day | ORAL | Status: DC
  Administered 2020-01-10 – 2020-01-11 (×2): 17 g via ORAL
  Filled 2020-01-10 (×2): qty 1

## 2020-01-10 MED ORDER — CHLORHEXIDINE GLUCONATE CLOTH 2 % EX PADS
6.0000 | MEDICATED_PAD | Freq: Every day | CUTANEOUS | Status: DC
  Administered 2020-01-11: 6 via TOPICAL

## 2020-01-10 MED ORDER — ONDANSETRON HCL 4 MG/2ML IJ SOLN
4.0000 mg | Freq: Once | INTRAMUSCULAR | Status: AC
  Administered 2020-01-10: 4 mg via INTRAVENOUS
  Filled 2020-01-10: qty 2

## 2020-01-10 MED ORDER — BUPRENORPHINE HCL 900 MCG BU FILM: 1.0000 | ORAL_FILM | Freq: Two times a day (BID) | BUCCAL | Status: DC

## 2020-01-10 MED ORDER — HYDRALAZINE HCL 25 MG PO TABS
25.0000 mg | ORAL_TABLET | Freq: Three times a day (TID) | ORAL | Status: DC
  Administered 2020-01-10: 25 mg via ORAL
  Filled 2020-01-10 (×2): qty 1

## 2020-01-10 MED ORDER — ONDANSETRON HCL 4 MG/2ML IJ SOLN: 4.0000 mg | Freq: Four times a day (QID) | INTRAMUSCULAR | Status: DC | PRN

## 2020-01-10 MED ORDER — DULOXETINE HCL 60 MG PO CPEP
60.0000 mg | ORAL_CAPSULE | Freq: Every day | ORAL | Status: DC
  Administered 2020-01-10 – 2020-01-11 (×2): 60 mg via ORAL
  Filled 2020-01-10 (×2): qty 1

## 2020-01-10 MED ORDER — ATORVASTATIN CALCIUM 40 MG PO TABS
40.0000 mg | ORAL_TABLET | Freq: Every day | ORAL | Status: DC
  Administered 2020-01-10 – 2020-01-11 (×2): 40 mg via ORAL
  Filled 2020-01-10 (×2): qty 1

## 2020-01-10 MED ORDER — OXYCODONE HCL 5 MG PO TABS
5.0000 mg | ORAL_TABLET | ORAL | Status: DC | PRN
  Administered 2020-01-10: 5 mg via ORAL
  Filled 2020-01-10: qty 1

## 2020-01-10 MED ORDER — CARVEDILOL 3.125 MG PO TABS
6.2500 mg | ORAL_TABLET | Freq: Two times a day (BID) | ORAL | Status: DC
  Filled 2020-01-10: qty 2

## 2020-01-10 MED ORDER — CYCLOBENZAPRINE HCL 10 MG PO TABS: 10.0000 mg | ORAL_TABLET | Freq: Two times a day (BID) | ORAL | Status: DC | PRN

## 2020-01-10 NOTE — H&P (Signed)
History and Physical    Mark [name] UXL:[id number] DOB: 12-22-1955 DOA: 01/09/2020  PCP: Leeanne Rio, MD   Patient coming from: Home.  I have personally briefly reviewed patient's old medical records in [place]  Chief Complaint: Shortness of breath.  HPI: Mark [name] is a 64 y.o. male with medical history significant of anxiety, panic attacks, history of herniated lumbar disc, chronic back pain, CAD, history of MI, chronic systolic congestive heart failure, ESRD on HD, type 2 diabetes, fibromyalgia, GERD, hyperlipidemia, hypertension, on home oxygen at 3 LPM, history of being treated for walking pneumonia earlier this week  who is coming to the emergency department due to dyspnea and hypoxia after hemodialysis requiring NRB mask.  The patient states that he has been having progressively worse dyspnea since about 3 weeks ago.  He denies chest pain, palpitations, diaphoresis, PND, but complains of occasionally productive cough orthopnea and lower extremity edema.  He has sore throat about a month ago, has been having hoarseness in his voice for about a month and decreased taste for about 2 weeks.  He denies fever, chills, abdominal pain, nausea, vomiting, diarrhea, constipation, melena or hematochezia.  He urinates usually once a day in the morning.  He denies dysuria, frequency or hematuria.  ED Course: Initial vital signs were temperature 98.6 F, pulse 90, respirations 26, blood pressure 168/98 mmHg and O2 sat 90% on nasal cannula oxygen.  CBC showed a white count 7.4, monos 7.7 g/dL and platelets 224.  Troponin was 29 ng/L and BNP 4023 pg/mL.  Sodium 128, potassium 5.8, chloride 90 and CO2 25 mmol/L.  Glucose 184, BUN 29, creatinine 4.75 calcium 8.0 mg/dL.  His chest radiograph shows severe cardiomegaly with pulmonary bilateral interstitial prominence and venous congestions.  There are bilateral pleural effusions as well.  Review of Systems: As per HPI otherwise all other  systems reviewed and are negative.  Past Medical History:  Diagnosis Date  . Anxiety    occas. panic attack, takes xanax occas  . Arthritis    herniated disc, lumbar   . Back pain   . CAD (coronary artery disease)    03/25/2015 95% prox to mid LAD treated with Promus Premier DES 3.5x70mm postdilated to 4.61mm, 100% prox to distal RCA lesion filled in the PDA region via collaterals from LAD and PLA  . Cancer ([place])    - skin ca on face- removed   . CHF (congestive heart failure) ([place])   . Chronic kidney disease   . Complication of anesthesia    " it takes a long time to get over it."   . Diabetes mellitus without complication ([place])    type 2  . Dialysis patient ([place])   . Fibromyalgia   . GERD (gastroesophageal reflux disease)    otc- pepcid , approx. every other  day    . H/O acute respiratory failure   . Hematuria    being followed by Dr. Hinda Lenis for decreased kidney function   . History of blood product transfusion 02/13/2014   After having back surgery  . Hypercholesteremia   . Hypertension   . Myocardial infarction ([place]) 03/2015  . Pneumonia    "walking"  . Shortness of breath dyspnea   . Sleep apnea    test aborted, due to not able to relax , since the aborted test he had surgery for gallbladder & he reports that he was told that he has sleep apnea     Past Surgical History:  Procedure Laterality Date  . A/V FISTULAGRAM Left 10/31/2016   Procedure: A/V Fistulagram;  Surgeon: Serafina Mitchell, MD;  Location: [facility];  Service: Cardiovascular;  Laterality: Left;  . AV FISTULA PLACEMENT Left 02/02/2015   Procedure: LEFT ARM RADIOCEPHALIC ARTERIOVENOUS (AV) FISTULA CREATION;  Surgeon: Conrad Scranton, MD;  Location: [facility];  Service: Vascular;  Laterality: Left;  . AV FISTULA PLACEMENT Left 05/25/2016   Procedure: ARTERIOVENOUS (AV) FISTULA CREATION;  Surgeon: [name], MD;  Location: [facility];  Service: Vascular;  Laterality: Left;  . BACK SURGERY  02/13/14  .  BIOPSY  03/08/2018   Procedure: BIOPSY;  Surgeon: Rogene Houston, MD;  [facility];  Service: Endoscopy;;  gastric  . CARDIAC CATHETERIZATION N/A 03/25/2015   Procedure: Left Heart Cath and Coronary Angiography;  Surgeon: Leonie Man, MD;  Location: [facility];  Service: Cardiovascular;  Laterality: N/A;  . CARDIAC CATHETERIZATION N/A 03/25/2015   Procedure: Coronary Stent Intervention;  Surgeon: Leonie Man, MD;  Location: [facility];  Service: Cardiovascular;  Laterality: N/A;  . CHOLECYSTECTOMY    . COLONOSCOPY    . COLONOSCOPY WITH PROPOFOL N/A 03/08/2018   Procedure: COLONOSCOPY WITH PROPOFOL;  Surgeon: Rogene Houston, MD;  [facility];  Service: Endoscopy;  Laterality: N/A;  11:10  . EYE SURGERY     cataracts remove, bilateral, w/IOL.  Marland Kitchen INSERTION OF DIALYSIS CATHETER N/A 03/09/2015   Procedure: INSERTION OF DIALYSIS CATHETER;  Surgeon: Conrad Paxtonia, MD;  Location: [facility];  Service: Vascular;  Laterality: N/A;  . INSERTION OF DIALYSIS CATHETER Left 04/07/2016   Procedure: INSERTION OF TUNNELED DIALYSIS CATHETER LEFT INTERNAL JUGULAR, ATTEMPTED INSERTION OF TUNNELLED CATHETER RIGHT INTERNAL JUGULAR;  Surgeon: [name], MD;  [facility];  Service: Vascular;  Laterality: Left;  . IR GENERIC HISTORICAL  07/28/2016   IR FLUORO GUIDE CV LINE LEFT 07/28/2016 Greggory Keen, MD [facility]  . IR REMOVAL TUN CV CATH W/O FL  03/28/2017  . LUMBAR LAMINECTOMY/DECOMPRESSION MICRODISCECTOMY Right 02/13/2014   Procedure: RIGHT LUMBAR FOUR-FIVE, RIGHT LUMBAR FIVE- SACRAL ONE LUMBAR LAMINECTOMY/DECOMPRESSION MICRODISCECTOMY ;  Surgeon: Ashok Pall, MD;  Location: [name];  Service: Neurosurgery;  Laterality: Right;  Right L4-5 and Right L5-S1 diskectomies  . PERIPHERAL VASCULAR BALLOON ANGIOPLASTY Left 10/31/2016   Procedure: Peripheral Vascular Balloon Angioplasty;  Surgeon: Serafina Mitchell, MD;  Location: [facility];  Service:  Cardiovascular;  Laterality: Left;  AV fistula  . PERIPHERAL VASCULAR BALLOON ANGIOPLASTY Left 01/28/2019   Procedure: PERIPHERAL VASCULAR BALLOON ANGIOPLASTY;  Surgeon: Serafina Mitchell, MD;  Location: [facility];  Service: Cardiovascular;  Laterality: Left;  arm fistula  . TEE WITHOUT CARDIOVERSION N/A 05/02/2016   Procedure: TRANSESOPHAGEAL ECHOCARDIOGRAM (TEE);  Surgeon: Herminio Commons, MD;  [facility];  Service: Cardiovascular;  Laterality: N/A;  . TONSILLECTOMY      Social History  reports that he has been smoking cigarettes. He started smoking about 46 years ago. He has a 20.00 pack-year smoking history. He has never used smokeless tobacco. He reports that he does not drink alcohol and does not use drugs.  Allergies  Allergen Reactions  . Morphine And Related     Upset stomach   . Varenicline Other (See Comments)    Causes vivid dreams    Family History  Adopted: Yes  Problem Relation Age of Onset  . Prostate cancer Father   .  Cancer Father        prostate  . Heart failure Maternal Grandmother   . Heart attack Maternal Grandfather   . Colon cancer Maternal Grandfather    Prior to Admission medications   Medication Sig Start Date End Date Taking? Authorizing Provider  albuterol (PROAIR HFA) 108 (90 Base) MCG/ACT inhaler Inhale 2 puffs into the lungs every 4 (four) hours as needed. 12/05/16  Yes [provider]  aspirin EC 81 MG tablet Take 1 tablet (81 mg total) by mouth daily. 03/09/18  Yes Rehman, Mechele Dawley, MD  atorvastatin (LIPITOR) 40 MG tablet Take 1 tablet (40 mg total) by mouth daily. 07/15/19 01/09/20 Yes Herminio Commons, MD  BELBUCA 900 MCG FILM Take 1 Film by mouth 2 (two) times daily. 01/02/20  Yes [provider]  benzonatate (TESSALON) 100 MG capsule Take 1 capsule (100 mg total) by mouth every 8 (eight) hours. 12/14/19  Yes Wurst, [place], PA-C  carvedilol (COREG) 6.25 MG tablet Take 1 tablet by mouth in the morning and  at bedtime. 10/13/19  Yes [provider]  DULoxetine (CYMBALTA) 60 MG capsule Take 60 mg by mouth daily.    Yes [provider]  hydrALAZINE (APRESOLINE) 25 MG tablet TAKE ONE TABLET BY MOUTH THREE TIMES DAILY 12/02/19  Yes Verta Ellen., NP  lidocaine-prilocaine (EMLA) cream Apply 1 application topically as needed (before dialysis).   Yes [provider]  nitroGLYCERIN (NITROSTAT) 0.4 MG SL tablet Place 1 tablet (0.4 mg total) under the tongue every 5 (five) minutes x 3 doses as needed for chest pain (If no relief after 3rd dose, proceed to the ED for an evaluation). I 04/12/18  Yes Herminio Commons, MD  polyethylene glycol (MIRALAX / GLYCOLAX) 17 g packet Take 17 g by mouth daily.   Yes [provider]  sucralfate (CARAFATE) 1 g tablet Take 1 g by mouth 3 (three) times daily.   Yes [provider]  traMADol (ULTRAM) 50 MG tablet Take 50 mg by mouth 2 (two) times daily as needed. 09/06/19  Yes [provider]  ASPERCREME LIDOCAINE 4 % PTCH Apply 1 patch topically daily. 10/13/19   [provider]  betamethasone dipropionate (DIPROLENE) 0.05 % cream Apply 1 application topically 2 (two) times daily as needed (irritation).    [provider]  cyclobenzaprine (FLEXERIL) 10 MG tablet Take 10 mg by mouth 2 (two) times daily.     [provider]  doxycycline (VIBRAMYCIN) 100 MG capsule Take 1 capsule (100 mg total) by mouth 2 (two) times daily. 12/25/19   Triplett, Tammy, PA-C  gabapentin (NEURONTIN) 100 MG capsule Take 100 mg by mouth 3 (three) times daily. 10/13/19   [provider]  levofloxacin (LEVAQUIN) 250 MG tablet Take two tablets today, then one tablet every other day 1/61/09   Delora Fuel, MD  methocarbamol (ROBAXIN) 500 MG tablet Take 500 mg by mouth 2 (two) times daily as needed. 10/13/19   [provider]  multivitamin (RENA-VIT) TABS tablet Take 1 tablet by mouth daily.    [provider]  nicotine (NICODERM CQ - DOSED IN MG/24 HR) 7 mg/24hr patch Place 7 mg onto the skin daily. 10/13/19   [provider]  ondansetron (ZOFRAN) 4 MG tablet Take 4 mg by mouth every 8 (eight) hours as needed for nausea or vomiting.    [provider]  oxyCODONE (OXY IR/ROXICODONE) 5 MG immediate release tablet Take 5 mg by mouth every 4 (four)  hours as needed. 10/17/19   [provider]  OXYGEN Inhale 3.5 L into the lungs continuous.     [provider]  predniSONE (STERAPRED UNI-PAK 21 TAB) 10 MG (21) TBPK tablet Take by mouth daily. Take 6 tabs by mouth daily  for 2 days, then 5 tabs for 2 days, then 4 tabs for 2 days, then 3 tabs for 2 days, 2 tabs for 2 days, then 1 tab by mouth daily for 2 days Patient not taking: Reported on 01/09/2020 12/14/19   Stacey Drain, [place], PA-C  tamsulosin (FLOMAX) 0.4 MG CAPS capsule Take 0.4 mg by mouth daily. 10/13/19   [provider]  triamcinolone (NASACORT ALLERGY 24HR) 55 MCG/ACT AERO nasal inhaler Place 2 sprays into the nose daily.    [provider]   Physical Exam: Vitals:   01/09/20 2155 01/09/20 2328 01/10/20 0044 01/10/20 0235  BP: (!) 146/74  (!) 147/75 (!) 161/91  Pulse: 76  69 66  Resp: 18  16   Temp: 97.8 F (36.6 C)  (!) 97.4 F (36.3 C) 98.2 F (36.8 C)  TempSrc: Oral  Oral Oral  SpO2: 98% 98% 95% 100%  Weight:    100.8 kg  Height:    5\' 11"  (1.803 m)   Constitutional: NAD, calm, comfortable Eyes: PERRL, lids and conjunctivae normal ENMT: Mucous membranes are moist. Posterior pharynx clear of any exudate or lesions. Neck: normal, supple, no masses, no thyromegaly Respiratory: Decreased breath sounds with bibasilar crackles. Normal respiratory effort. No accessory muscle use.  Cardiovascular: Regular rate and rhythm, no murmurs / rubs / gallops.  1+ pitting lower extremity edema. 2+ pedal pulses. No carotid bruits.  Abdomen: Obese, nondistended.  BS positive.  Soft, no  tenderness, no masses palpated. No hepatosplenomegaly.  Musculoskeletal: Generalized weakness.  No clubbing / cyanosis. Good ROM, no contractures. Normal muscle tone.  Skin: Multiple areas of ecchymosis and healing wounds on extremities. Neurologic: CN 2-12 grossly intact. Sensation intact, DTR normal. Strength 5/5 in all 4.  Psychiatric: Normal judgment and insight. Alert and oriented x 3. Normal mood.   Labs on Admission: I have personally reviewed following labs and imaging studies  CBC: Recent Labs  Lab 01/09/20 1250  WBC 7.4  NEUTROABS 6.3  HGB 7.7*  HCT 26.0*  MCV 91.5  PLT 381    Basic Metabolic Panel: Recent Labs  Lab 01/09/20 1250  NA 128*  K 5.0  CL 90*  CO2 26  GLUCOSE 184*  BUN 29*  CREATININE 4.75*  CALCIUM 8.0*    GFR: Estimated Creatinine Clearance: 19 mL/min (A) (by C-G formula based on SCr of 4.75 mg/dL (H)).  Liver Function Tests: Recent Labs  Lab 01/09/20 1250  AST 13*  ALT 16  ALKPHOS 355*  BILITOT 0.9  PROT 7.0  ALBUMIN 2.7*   Radiological Exams on Admission: DG Chest Portable 1 View  Result Date: 01/09/2020 CLINICAL DATA:  Shortness of breath. EXAM: PORTABLE CHEST 1 VIEW COMPARISON:  CT chest 12/29/2019.  Chest x-ray 12/29/2019. FINDINGS: Severe cardiomegaly again noted. Pulmonary venous congestion. Bilateral interstitial prominence and bilateral pleural effusions. Findings consistent with CHF. Pneumonitis cannot be excluded. No pneumothorax. Degenerative change thoracic spine. IMPRESSION: Severe cardiomegaly again noted. Pulmonary venous congestion, bilateral interstitial prominence, bilateral pleural effusions are noted consistent with CHF. Pneumonitis cannot be excluded Electronically Signed   By: Marcello Moores  Register   On: 01/09/2020 09:46   November 2017 TEE -------------------------------------------------------------------  LV EF: 35% -  40%   -------------------------------------------------------------------  Indications:  Bacteremia 790.7.   -------------------------------------------------------------------  History:  PMH: Bradycardia, ESRD. Bacteremia. Risk factors:  Hypertension.   -------------------------------------------------------------------  Study Conclusions   - Left ventricle: Hypertrophy was noted. Systolic function was  moderately reduced. The estimated ejection fraction was in the  range of 35% to 40%. Diffuse hypokinesis with severe inferior and  inferoseptal hypokinesis to akinesis.  - Aortic valve: Trileaflet; moderate nodular thickening and  calcification. No evidence of vegetations.  - Aorta: Mild atherosclerotic plaque disease.  - Mitral valve: Mildly thickened leaflets . No evidence of  vegetation. There was mild to moderate regurgitation.  - Left atrium: The atrium was dilated. No evidence of thrombus in  the atrial cavity or appendage.  - Tricuspid valve: No evidence of vegetation. There was mild  regurgitation.  - Pulmonic valve: No evidence of vegetation.   Impressions:   - There was no evidence of a vegetation.   EKG: Independently reviewed. Vent. rate 96 BPM PR interval * ms QRS duration 143 ms QT/QTc 379/479 ms P-R-T axes 48 22 133 Sinus rhythm Prolonged PR interval Probable left atrial enlargement IVCD, consider atypical LBBB  Assessment/Plan Principal Problem:   Hypoxia   Acute on chronic systolic CHF (congestive heart failure) (HCC) Observation/telemetry. Continue supplemental oxygen. Fluid and sodium restriction. Furosemide 80 mg IVP x1 dose. Monitor daily weights, intake and output. Follow-up renal function and electrolytes. ACE/ARB contraindicated. Beta-blocker held due to acute exacerbation.  Active Problems:   Hyponatremia (hypervolemic) In the setting of acute systolic CHF    Essential hypertension Holding carvedilol. Received furosemide 80 mg IVP earlier. Monitor blood pressure, BUN, creatinine and electrolytes.     ESRD (end stage renal disease) on dialysis [facility]) Last HD was yesterday. May need HD if diuretics not effective.    Diabetes mellitus type 2 in obese (HCC) Carbohydrate modified diet. CBG monitoring with R ISS.     DVT prophylaxis: SCDs. Code Status:   Full code. Family Communication: Disposition Plan:   Patient is from:  Home.  Anticipated DC to:  Home.  Anticipated DC date:  01/11/2020.  Anticipated DC barriers: Clinical status.  Consults called: Admission status:  Observation/telemetry.  Severity of Illness:  Reubin Milan MD [facility]  How to contact the [facility] Attending or Consulting provider [place] or covering provider during after hours [place], for this patient?   1. Check the care team in [facility] and look for a) attending/consulting [facility] provider listed and b) the [facility] team listed 2. Log into [email or web address] and use [place]'s universal password to access. If you do not have the password, please contact the hospital operator. 3. Locate the [facility] provider you are looking for under [facility] and page to a number that you can be directly reached. 4. If you still have difficulty reaching the provider, please page the [facility] (Director on Call) for the Hospitalists listed on amion for assistance.  01/10/2020, 5:18 AM   This document was prepared using Dragon voice recognition software and may contain some unintended transcription errors.

## 2020-01-10 NOTE — Progress Notes (Signed)
Patient admitted by Dr. Olevia Bowens earlier this morning  Patient seen and examined.  Overall he feels her shortness of breath is better after dialysis yesterday, but not back to baseline.  Continues to have lower extremity edema and crackles at bases on lung exam.  1. Acute on chronic respiratory failure with hypoxia secondary to acute on chronic systolic congestive heart failure.  Echocardiogram has been ordered.  Patient appears to be improving with dialysis, although he still has excess volume.  Continue volume removal with dialysis. 2. End-stage renal disease on hemodialysis.  Appreciate nephrology input.  He was dialyzed on 8/6.  Will have another dialysis session on 8/8.  Followed by his regular dialysis on 8/9. 3. Hyponatremia.  In the setting of volume overload.  Improving with dialysis 4. Hypertension.  Chronically on Coreg and hydralazine.  Will resume once medical reconciliation has been completed. 5. Diabetes.  He is not on any chronic medications.  Appears to be diet controlled. 6. Anemia of end-stage renal disease.  Receiving darbepoetin.  No signs of bleeding 7. Chronic pain syndrome.  Chronically on Belbuca, oxycodone, gabapentin and Flexeril.  Resume once medication reconciliation is performed. 8. Hyperlipidemia.  Resume statin  [employer]

## 2020-01-10 NOTE — ED Provider Notes (Signed)
Patient underwent dialysis here.  We are walked him after dialysis.  He overall felt better even felt better with the walking but he still desatted serrated.  So discussed with hospitalist they will admit.  Patient's BNP is elevated but he is a dialysis patient.  There was some question or concern may be for consideration for echocardiogram to further evaluate.  At rest patient is very comfortable and very stable.  Patient normally does have oxygen at home he was walked on 3 L.  Still desaturated and also became very fatigued.   Fredia Sorrow, MD 01/10/20 Shelah Lewandowsky

## 2020-01-10 NOTE — Consult Note (Signed)
Reason for Consult: To manage dialysis and dialysis related needs Referring Physician: [name] is an 64 y.o. male with past medical history significant for DM, HTN, ESRD- MWF at [facility], CAD, CHF on chronic home O2.  He tells me that he has been challenging himself at dialysis lately-  Has been sick so feels has lost body weight.  He came to ER instead of HD yesterday for DOE.  We did HD in ER yest- removed 3500 without cramping or BP drop-  Was still hypoxic with activity after so was admitted.  Echo is pending- CXR showed cardiomegaly and pleural effusions.  He acknowledges that his EDW needs lowering but also thinks there is something else going on   Dialyzes at [facility]. HD [place] unknown, Dialyzer unknown, Heparin unknown. Access AVF.  Past Medical History:  Diagnosis Date  . Anxiety    occas. panic attack, takes xanax occas  . Arthritis    herniated disc, lumbar   . Back pain   . CAD (coronary artery disease)    03/25/2015 95% prox to mid LAD treated with Promus Premier DES 3.5x47mm postdilated to 4.8mm, 100% prox to distal RCA lesion filled in the PDA region via collaterals from LAD and PLA  . Cancer ([place])    - skin ca on face- removed   . CHF (congestive heart failure) ([place])   . Chronic kidney disease   . Complication of anesthesia    " it takes a long time to get over it."   . Diabetes mellitus without complication ([place])    type 2  . Dialysis patient ([place])   . Fibromyalgia   . GERD (gastroesophageal reflux disease)    otc- pepcid , approx. every other  day    . H/O acute respiratory failure   . Hematuria    being followed by Dr. Hinda Lenis for decreased kidney function   . History of blood product transfusion 02/13/2014   After having back surgery  . Hypercholesteremia   . Hypertension   . Myocardial infarction ([place]) 03/2015  . Pneumonia    "walking"  . Shortness of breath dyspnea   . Sleep apnea    test aborted, due to not able to relax  , since the aborted test he had surgery for gallbladder & he reports that he was told that he has sleep apnea     Past Surgical History:  Procedure Laterality Date  . A/V FISTULAGRAM Left 10/31/2016   Procedure: A/V Fistulagram;  Surgeon: Serafina Mitchell, MD;  Location: [place];  Service: Cardiovascular;  Laterality: Left;  . AV FISTULA PLACEMENT Left 02/02/2015   Procedure: LEFT ARM RADIOCEPHALIC ARTERIOVENOUS (AV) FISTULA CREATION;  Surgeon: Conrad [place], MD;  Location: [facility];  Service: Vascular;  Laterality: Left;  . AV FISTULA PLACEMENT Left 05/25/2016   Procedure: ARTERIOVENOUS (AV) FISTULA CREATION;  Surgeon: [name], MD;  Location: [facility];  Service: Vascular;  Laterality: Left;  . BACK SURGERY  02/13/14  . BIOPSY  03/08/2018   Procedure: BIOPSY;  Surgeon: Rogene Houston, MD;  Location[facility];  Service: Endoscopy;;  gastric  . CARDIAC CATHETERIZATION N/A 03/25/2015   Procedure: Left Heart Cath and Coronary Angiography;  Surgeon: Leonie Man, MD;  Location: [place] [facility];  Service: Cardiovascular;  Laterality: N/A;  . CARDIAC CATHETERIZATION N/A 03/25/2015   Procedure: Coronary Stent Intervention;  Surgeon: Leonie Man, MD;  Location: [facility]  [facility] LAB;  Service: Cardiovascular;  Laterality: N/A;  . CHOLECYSTECTOMY    . COLONOSCOPY    . COLONOSCOPY WITH PROPOFOL N/A 03/08/2018   Procedure: COLONOSCOPY WITH PROPOFOL;  Surgeon: Rogene Houston, MD;  [facility];  Service: Endoscopy;  Laterality: N/A;  11:10  . EYE SURGERY     cataracts remove, bilateral, w/IOL.  Marland Kitchen INSERTION OF DIALYSIS CATHETER N/A 03/09/2015   Procedure: INSERTION OF DIALYSIS CATHETER;  Surgeon: Conrad Fairfield, MD;  Location: [facility];  Service: Vascular;  Laterality: N/A;  . INSERTION OF DIALYSIS CATHETER Left 04/07/2016   Procedure: INSERTION OF TUNNELED DIALYSIS CATHETER LEFT INTERNAL JUGULAR, ATTEMPTED INSERTION OF TUNNELLED CATHETER RIGHT INTERNAL JUGULAR;  Surgeon:  [name], MD;  Location: [facility];  Service: Vascular;  Laterality: Left;  . IR GENERIC HISTORICAL  07/28/2016   IR FLUORO GUIDE CV LINE LEFT 07/28/2016 Greggory Keen, MD [facility]-INTER[facility] RAD  . IR REMOVAL TUN CV CATH W/O FL  03/28/2017  . LUMBAR LAMINECTOMY/DECOMPRESSION MICRODISCECTOMY Right 02/13/2014   Procedure: RIGHT LUMBAR FOUR-FIVE, RIGHT LUMBAR FIVE- SACRAL ONE LUMBAR LAMINECTOMY/DECOMPRESSION MICRODISCECTOMY ;  Surgeon: Ashok Pall, MD;  Location: [facility];  Service: Neurosurgery;  Laterality: Right;  Right L4-5 and Right L5-S1 diskectomies  . PERIPHERAL VASCULAR BALLOON ANGIOPLASTY Left 10/31/2016   Procedure: Peripheral Vascular Balloon Angioplasty;  Surgeon: Serafina Mitchell, MD;  Location: [place];  Service: Cardiovascular;  Laterality: Left;  AV fistula  . PERIPHERAL VASCULAR BALLOON ANGIOPLASTY Left 01/28/2019   Procedure: PERIPHERAL VASCULAR BALLOON ANGIOPLASTY;  Surgeon: Serafina Mitchell, MD;  Location: [place] [facility];  Service: Cardiovascular;  Laterality: Left;  arm fistula  . TEE WITHOUT CARDIOVERSION N/A 05/02/2016   Procedure: TRANSESOPHAGEAL ECHOCARDIOGRAM (TEE);  Surgeon: Herminio Commons, MD;  [facility];  Service: Cardiovascular;  Laterality: N/A;  . TONSILLECTOMY      Family History  Adopted: Yes  Problem Relation Age of Onset  . Prostate cancer Father   . Cancer Father        prostate  . Heart failure Maternal Grandmother   . Heart attack Maternal Grandfather   . Colon cancer Maternal Grandfather     Social History:  reports that he has been smoking cigarettes. He started smoking about 46 years ago. He has a 20.00 pack-year smoking history. He has never used smokeless tobacco. He reports that he does not drink alcohol and does not use drugs.  Allergies:  Allergies  Allergen Reactions  . Morphine And Related     Upset stomach   . Varenicline Other (See Comments)    Causes vivid dreams    Medications: I have reviewed the  patient's current medications.   Results for orders placed or performed during the hospital encounter of 01/09/20 (from the past 48 hour(s))  SARS Coronavirus 2 by RT PCR (hospital order, performed in [facility] hospital lab) Nasopharyngeal Nasopharyngeal Swab     Status: None   Collection Time: 01/09/20  9:21 AM   Specimen: Nasopharyngeal Swab  Result Value Ref Range   SARS Coronavirus 2 NEGATIVE NEGATIVE    Comment: (NOTE) SARS-CoV-2 target nucleic acids are NOT DETECTED.  The SARS-CoV-2 RNA is generally detectable in upper and lower respiratory specimens during the acute phase of infection. The lowest concentration of SARS-CoV-2 viral copies this assay can detect is 250 copies / mL. A negative result does not preclude SARS-CoV-2 infection and should not be used as the sole basis for treatment or other patient  management decisions.  A negative result may occur with improper specimen collection / handling, submission of specimen other than nasopharyngeal swab, presence of viral mutation(s) within the areas targeted by this assay, and inadequate number of viral copies (<250 copies / mL). A negative result must be combined with clinical observations, patient history, and epidemiological information.  Fact Sheet for Patients:   StrictlyIdeas.no  Fact Sheet for Healthcare Providers: BankingDealers.co.za  This test is not yet approved or  cleared by the [place] FDA and has been authorized for detection and/or diagnosis of SARS-CoV-2 by FDA under an Emergency Use Authorization (EUA).  This EUA will remain in effect (meaning this test can be used) for the duration of the COVID-19 declaration under Section 564(b)(1) of the Act, 21 U.S.C. section 360bbb-3(b)(1), unless the authorization is terminated or revoked sooner.  Performed at [facility], [address], [place], [place] [address]   Comprehensive metabolic panel     Status:  Abnormal   Collection Time: 01/09/20 12:50 PM  Result Value Ref Range   Sodium 128 (L) 135 - 145 mmol/L   Potassium 5.0 3.5 - 5.1 mmol/L   Chloride 90 (L) 98 - 111 mmol/L   CO2 26 22 - 32 mmol/L   Glucose, Bld 184 (H) 70 - 99 mg/dL    Comment: Glucose reference range applies only to samples taken after fasting for at least 8 hours.   BUN 29 (H) 8 - 23 mg/dL   Creatinine, Ser 4.75 (H) 0.61 - 1.24 mg/dL   Calcium 8.0 (L) 8.9 - 10.3 mg/dL   Total Protein 7.0 6.5 - 8.1 g/dL   Albumin 2.7 (L) 3.5 - 5.0 g/dL   AST 13 (L) 15 - 41 U/L   ALT 16 0 - 44 U/L   Alkaline Phosphatase 355 (H) 38 - 126 U/L   Total Bilirubin 0.9 0.3 - 1.2 mg/dL   GFR calc non Af Amer 12 (L) >60 mL/min   GFR calc Af Amer 14 (L) >60 mL/min   Anion gap 12 5 - 15    Comment: Performed at [facility], [address], [place], [place] [address]  Brain natriuretic peptide     Status: Abnormal   Collection Time: 01/09/20 12:50 PM  Result Value Ref Range   B Natriuretic Peptide 4,023.0 (H) 0.0 - 100.0 pg/mL    Comment: Performed at [facility], [address], [place], [place] [address]  Troponin I (High Sensitivity)     Status: Abnormal   Collection Time: 01/09/20 12:50 PM  Result Value Ref Range   Troponin I (High Sensitivity) 29 (H) <18 ng/L    Comment: (NOTE) Elevated high sensitivity troponin I (hsTnI) values and significant  changes across serial measurements may suggest ACS but many other  chronic and acute conditions are known to elevate hsTnI results.  Refer to the "Links" section for chest pain algorithms and additional  guidance. Performed at [facility], [address], [place], [place] [address]   CBC with Differential     Status: Abnormal   Collection Time: 01/09/20 12:50 PM  Result Value Ref Range   WBC 7.4 4.0 - 10.5 K/uL   RBC 2.84 (L) 4.22 - 5.81 MIL/uL   Hemoglobin 7.7 (L) 13.0 - 17.0 g/dL   HCT 26.0 (L) 39 - 52 %   MCV 91.5 80.0 - 100.0 fL   MCH 27.1 26.0 - 34.0 pg   MCHC 29.6 (L) 30.0 - 36.0  g/dL   RDW 15.8 (H) 11.5 -  15.5 %   Platelets 224 150 - 400 K/uL   nRBC 0.0 0.0 - 0.2 %   Neutrophils Relative % 85 %   Neutro Abs 6.3 1.7 - 7.7 K/uL   Lymphocytes Relative 7 %   Lymphs Abs 0.5 (L) 0.7 - 4.0 K/uL   Monocytes Relative 6 %   Monocytes Absolute 0.4 0 - 1 K/uL   Eosinophils Relative 1 %   Eosinophils Absolute 0.1 0 - 0 K/uL   Basophils Relative 1 %   Basophils Absolute 0.1 0 - 0 K/uL   Immature Granulocytes 0 %   Abs Immature Granulocytes 0.02 0.00 - 0.07 K/uL    Comment: Performed at [facility], [address] [place], [place] [address]  Hepatitis B surface antigen     Status: None   Collection Time: 01/09/20 12:50 PM  Result Value Ref Range   Hepatitis B Surface Ag NON REACTIVE NON REACTIVE    Comment: Performed at [facility] [address], [place], [place] [address]  HIV Antibody (routine testing w rflx)     Status: None   Collection Time: 01/10/20  6:53 AM  Result Value Ref Range   HIV Screen 4th Generation wRfx Non Reactive Non Reactive    Comment: Performed at [facility], [place] [address] [place], [place] [address]  Basic metabolic panel     Status: Abnormal   Collection Time: 01/10/20  6:53 AM  Result Value Ref Range   Sodium 132 (L) 135 - 145 mmol/L   Potassium 4.3 3.5 - 5.1 mmol/L   Chloride 92 (L) 98 - 111 mmol/L   CO2 29 22 - 32 mmol/L   Glucose, Bld 161 (H) 70 - 99 mg/dL    Comment: Glucose reference range applies only to samples taken after fasting for at least 8 hours.   BUN 16 8 - 23 mg/dL   Creatinine, Ser 3.25 (H) 0.61 - 1.24 mg/dL   Calcium 8.1 (L) 8.9 - 10.3 mg/dL   GFR calc non Af Amer 19 (L) >60 mL/min   GFR calc Af Amer 22 (L) >60 mL/min   Anion gap 11 5 - 15    Comment: Performed at [facility], [address] [place], [place] [address]  CBC     Status: Abnormal   Collection Time: 01/10/20  6:53 AM  Result Value Ref Range   WBC 5.2 4.0 - 10.5 K/uL   RBC 2.89 (L) 4.22 - 5.81 MIL/uL   Hemoglobin 7.9 (L) 13.0 - 17.0  g/dL   HCT 26.7 (L) 39 - 52 %   MCV 92.4 80.0 - 100.0 fL   MCH 27.3 26.0 - 34.0 pg   MCHC 29.6 (L) 30.0 - 36.0 g/dL   RDW 15.8 (H) 11.5 - 15.5 %   Platelets 226 150 - 400 K/uL   nRBC 0.0 0.0 - 0.2 %    Comment: Performed at [facility], [address] [place], [place] [address]  Glucose, capillary     Status: Abnormal   Collection Time: 01/10/20  7:49 AM  Result Value Ref Range   Glucose-Capillary 140 (H) 70 - 99 mg/dL    Comment: Glucose reference range applies only to samples taken after fasting for at least 8 hours.  Glucose, capillary     Status: Abnormal   Collection Time: 01/10/20 11:28 AM  Result Value Ref Range   Glucose-Capillary 177 (H) 70 - 99 mg/dL    Comment: Glucose reference range applies only to samples  taken after fasting for at least 8 hours.    DG Chest Portable 1 View  Result Date: 01/09/2020 CLINICAL DATA:  Shortness of breath. EXAM: PORTABLE CHEST 1 VIEW COMPARISON:  CT chest 12/29/2019.  Chest x-ray 12/29/2019. FINDINGS: Severe cardiomegaly again noted. Pulmonary venous congestion. Bilateral interstitial prominence and bilateral pleural effusions. Findings consistent with CHF. Pneumonitis cannot be excluded. No pneumothorax. Degenerative change thoracic spine. IMPRESSION: Severe cardiomegaly again noted. Pulmonary venous congestion, bilateral interstitial prominence, bilateral pleural effusions are noted consistent with CHF. Pneumonitis cannot be excluded Electronically Signed   By: Marcello Moores  Register   On: 01/09/2020 09:46    ROS: DOE-  Also has edema Blood pressure (!) 147/64, pulse 70, temperature 98 F (36.7 C), temperature source Oral, resp. rate 18, height 5\' 11"  (1.803 m), weight 100.8 kg, SpO2 96 %. General appearance: alert, no distress and chronically ill Resp: diminished breath sounds bilaterally Cardio: regular rate and rhythm, S1, S2 normal, no murmur, click, rub or gallop GI: soft, non-tender; bowel sounds normal; no masses,  no  organomegaly Extremities: edema at least 1+ left upper arm AVF-  patent  Assessment/Plan: 64 year old WM-  Multiple medical issues presents with DOE-  CXR showing fluid-  Did not improve with UF yesterday  1 DOE-  Did not improve enough with HD/UF yesterday so got admitted-  Echo and other work up per primary team- given lasix but not sure that will be effective.  Will plan to do extra HD tomorrow-  Sequential only where we can concentrate on volume removal only  2 ESRD: normally MWF at [facility] via AVF-  Will do extra treatment tomorrow , then back on schedule Monday with extra treatment 3 Hypertension: BP not low-   Should support UF 4. Anemia of ESRD: very anemic-  Likely not helping his DOE-  dont know his ESA dose as an OP-  Will give darbe here-   5. Metabolic Bone Disease: no binder on home med list  Renal will not physically see tomorrow but chart will be reviewed, call with specific questions    Mark [name] 01/10/2020, 1:27 PM

## 2020-01-10 NOTE — Care Management Obs Status (Signed)
[place] NOTIFICATION   Patient Details  Name: Mark [name] MRN: [id number] Date of Birth: Sep 07, 1955   Medicare Observation Status Notification Given:  Yes    Natasha Bence, LCSW 01/10/2020, 4:32 PM

## 2020-01-10 NOTE — Progress Notes (Signed)
*  PRELIMINARY RESULTS* Echocardiogram 2D Echocardiogram has been performed.  Mark [name] 01/10/2020, 10:54 AM

## 2020-01-11 DIAGNOSIS — E1169 Type 2 diabetes mellitus with other specified complication: Secondary | ICD-10-CM

## 2020-01-11 DIAGNOSIS — N186 End stage renal disease: Secondary | ICD-10-CM

## 2020-01-11 DIAGNOSIS — Z992 Dependence on renal dialysis: Secondary | ICD-10-CM

## 2020-01-11 DIAGNOSIS — I5023 Acute on chronic systolic (congestive) heart failure: Secondary | ICD-10-CM | POA: Diagnosis not present

## 2020-01-11 DIAGNOSIS — E669 Obesity, unspecified: Secondary | ICD-10-CM

## 2020-01-11 DIAGNOSIS — I10 Essential (primary) hypertension: Secondary | ICD-10-CM | POA: Diagnosis not present

## 2020-01-11 LAB — CBC
HCT: 26.2 % — ABNORMAL LOW (ref 39.0–52.0)
Hemoglobin: 7.8 g/dL — ABNORMAL LOW (ref 13.0–17.0)
MCH: 27.6 pg (ref 26.0–34.0)
MCHC: 29.8 g/dL — ABNORMAL LOW (ref 30.0–36.0)
MCV: 92.6 fL (ref 80.0–100.0)
Platelets: 215 10*3/uL (ref 150–400)
RBC: 2.83 MIL/uL — ABNORMAL LOW (ref 4.22–5.81)
RDW: 15.8 % — ABNORMAL HIGH (ref 11.5–15.5)
WBC: 5.7 10*3/uL (ref 4.0–10.5)
nRBC: 0 % (ref 0.0–0.2)

## 2020-01-11 LAB — RENAL FUNCTION PANEL
Albumin: 2.8 g/dL — ABNORMAL LOW (ref 3.5–5.0)
Anion gap: 11 (ref 5–15)
BUN: 25 mg/dL — ABNORMAL HIGH (ref 8–23)
CO2: 28 mmol/L (ref 22–32)
Calcium: 7.8 mg/dL — ABNORMAL LOW (ref 8.9–10.3)
Chloride: 91 mmol/L — ABNORMAL LOW (ref 98–111)
Creatinine, Ser: 4.6 mg/dL — ABNORMAL HIGH (ref 0.61–1.24)
GFR calc Af Amer: 15 mL/min — ABNORMAL LOW (ref >60)
GFR calc non Af Amer: 13 mL/min — ABNORMAL LOW (ref >60)
Glucose, Bld: 152 mg/dL — ABNORMAL HIGH (ref 70–99)
Phosphorus: 4.7 mg/dL — ABNORMAL HIGH (ref 2.5–4.6)
Potassium: 4.7 mmol/L (ref 3.5–5.1)
Sodium: 130 mmol/L — ABNORMAL LOW (ref 135–145)

## 2020-01-11 LAB — GLUCOSE, CAPILLARY
Glucose-Capillary: 154 mg/dL — ABNORMAL HIGH (ref 70–99)
Glucose-Capillary: 236 mg/dL — ABNORMAL HIGH (ref 70–99)
Glucose-Capillary: 148 mg/dL — ABNORMAL HIGH (ref 70–99)

## 2020-01-11 MED ORDER — DARBEPOETIN ALFA 200 MCG/0.4ML IJ SOSY
200.0000 ug | PREFILLED_SYRINGE | Freq: Once | INTRAMUSCULAR | Status: AC
  Administered 2020-01-11: 200 ug via INTRAVENOUS
  Filled 2020-01-11: qty 0.4

## 2020-01-11 MED ORDER — DARBEPOETIN ALFA 200 MCG/0.4ML IJ SOSY: 200.0000 ug | PREFILLED_SYRINGE | Freq: Once | INTRAMUSCULAR | Status: DC

## 2020-01-11 NOTE — Procedures (Signed)
     HEMODIALYSIS TREATMENT NOTE:  Extra treatment ordered for volume overload.  3 hour heparin-free sequential UF treatment completed via LUE AVF (15g/antegrade).  BP began declining during last 30 minutes of treatment and UF goal was lowered.  Tolerated removal of 4L without interruption in ultrafiltration.  All blood was returned and hemostasis was achieved in 25 minutes.  Dyspnea and aeration improved post-treatment; otherwise, no changes from pre-HD assessment.  Wants to eat dinner and rest a few minutes before attempting ambulation with primary RN to see if he feels well enough to discharge home.   Rockwell [place], RN

## 2020-01-11 NOTE — Discharge Summary (Signed)
Physician Discharge Summary  Mark [name] NIO:[id number] DOB: 05/18/1956 DOA: 01/09/2020  PCP: Leeanne Rio, MD  Admit date: 01/09/2020 Discharge date: 01/11/2020  Admitted From: home Disposition:  home  Recommendations for Outpatient Follow-up:  1. Follow up with primary dialysis center tomorrow as per the regular schedule. 2. Follow-up with cardiology for echocardiogram results as previously scheduled  Discharge Condition: Stable CODE STATUS: Full code Diet recommendation: Heart healthy, carb modified  Brief/Interim Summary: HPI: Mark [name] is a 64 y.o. male with medical history significant of anxiety, panic attacks, history of herniated lumbar disc, chronic back pain, CAD, history of MI, chronic systolic congestive heart failure, ESRD on HD, type 2 diabetes, fibromyalgia, GERD, hyperlipidemia, hypertension, on home oxygen at 3 LPM, history of being treated for walking pneumonia earlier this week  who is coming to the emergency department due to dyspnea and hypoxia after hemodialysis requiring NRB mask.  The patient states that he has been having progressively worse dyspnea since about 3 weeks ago.  He denies chest pain, palpitations, diaphoresis, PND, but complains of occasionally productive cough orthopnea and lower extremity edema.  He has sore throat about a month ago, has been having hoarseness in his voice for about a month and decreased taste for about 2 weeks.  He denies fever, chills, abdominal pain, nausea, vomiting, diarrhea, constipation, melena or hematochezia.  He urinates usually once a day in the morning.  He denies dysuria, frequency or hematuria.  ED Course: Initial vital signs were temperature 98.6 F, pulse 90, respirations 26, blood pressure 168/98 mmHg and O2 sat 90% on nasal cannula oxygen.  Discharge Diagnoses:  Principal Problem:   Acute on chronic systolic CHF (congestive heart failure) (HCC) Active Problems:   Essential hypertension   ESRD (end stage  renal disease) on dialysis (HCC)   Diabetes mellitus type 2 in obese (HCC)   Hypoxia   Volume overload  1. Acute on chronic respiratory failure with hypoxia secondary to acute on chronic systolic congestive heart failure.  Last echocardiogram showed ejection fraction of 35 to 40% in 2017.  Repeat echocardiogram was performed during this admission, although results are currently pending.   Patient underwent substantial volume removal during his 2 dialysis treatments during his hospitalization.  Overall respiratory status has returned to baseline.  He is able to ambulate without difficulty. 2. End-stage renal disease on hemodialysis.  Appreciate nephrology input.  He was dialyzed on 8/6 and 8/8.    He will follow up with his outpatient dialysis center for his regular dialysis on 8/9. 3. Hyponatremia.  In the setting of volume overload.  Improving with dialysis 4. Hypertension.  Chronically on Coreg and hydralazine.    Is to be resumed on discharge 5. Diabetes.  He is not on any chronic medications.  Appears to be diet controlled. 6. Anemia of end-stage renal disease.  Receiving darbepoetin.  No signs of bleeding 7. Chronic pain syndrome.  Chronically on Belbuca, oxycodone, gabapentin and Flexeril.    Resume on discharge 8. Hyperlipidemia.  Resume statin  Discharge Instructions  Discharge Instructions    Diet - low sodium heart healthy   Complete by: As directed    Increase activity slowly   Complete by: As directed      Allergies as of 01/11/2020      Reactions   Morphine And Related    Upset stomach    Varenicline Other (See Comments)   Causes vivid dreams      Medication List    STOP  taking these medications   predniSONE 10 MG (21) Tbpk tablet Commonly known as: STERAPRED UNI-PAK 21 TAB     TAKE these medications   Aspercreme Lidocaine 4 % Ptch Generic drug: Lidocaine Apply 1 patch topically daily.   aspirin EC 81 MG tablet Take 1 tablet (81 mg total) by mouth daily.    atorvastatin 40 MG tablet Commonly known as: LIPITOR Take 1 tablet (40 mg total) by mouth daily.   Belbuca 900 MCG Film Generic drug: Buprenorphine HCl Take 1 Film by mouth 2 (two) times daily.   benzonatate 100 MG capsule Commonly known as: TESSALON Take 1 capsule (100 mg total) by mouth every 8 (eight) hours.   betamethasone dipropionate 0.05 % cream Apply 1 application topically 2 (two) times daily as needed (irritation).   carvedilol 6.25 MG tablet Commonly known as: COREG Take 1 tablet by mouth in the morning and at bedtime.   cyclobenzaprine 10 MG tablet Commonly known as: FLEXERIL Take 10 mg by mouth 2 (two) times daily.   DULoxetine 60 MG capsule Commonly known as: CYMBALTA Take 60 mg by mouth daily.   gabapentin 100 MG capsule Commonly known as: NEURONTIN Take 100 mg by mouth 3 (three) times daily.   hydrALAZINE 25 MG tablet Commonly known as: APRESOLINE TAKE ONE TABLET BY MOUTH THREE TIMES DAILY   lidocaine-prilocaine cream Commonly known as: EMLA Apply 1 application topically as needed (before dialysis).   multivitamin Tabs tablet Take 1 tablet by mouth daily.   nicotine 7 mg/24hr patch Commonly known as: NICODERM CQ - dosed in mg/24 hr Place 7 mg onto the skin daily.   nitroGLYCERIN 0.4 MG SL tablet Commonly known as: NITROSTAT Place 1 tablet (0.4 mg total) under the tongue every 5 (five) minutes x 3 doses as needed for chest pain (If no relief after 3rd dose, proceed to the ED for an evaluation). I   ondansetron 4 MG tablet Commonly known as: ZOFRAN Take 4 mg by mouth every 8 (eight) hours as needed for nausea or vomiting.   OXYGEN Inhale 3.5 L into the lungs continuous.   polyethylene glycol 17 g packet Commonly known as: MIRALAX / GLYCOLAX Take 17 g by mouth daily.   ProAir HFA 108 (90 Base) MCG/ACT inhaler Generic drug: albuterol Inhale 2 puffs into the lungs every 4 (four) hours as needed.   sucralfate 1 g tablet Commonly known as:  CARAFATE Take 1 g by mouth 3 (three) times daily.   traMADol 50 MG tablet Commonly known as: ULTRAM Take 50 mg by mouth 2 (two) times daily as needed.       Allergies  Allergen Reactions  . Morphine And Related     Upset stomach   . Varenicline Other (See Comments)    Causes vivid dreams    Consultations:  Nephrology   Procedures/Studies: DG Chest 2 View  Result Date: 12/29/2019 CLINICAL DATA:  Constipation and shortness of breath for EXAM: CHEST - 2 VIEW COMPARISON:  December 25, 2019 FINDINGS: There is mild cardiomegaly. There is prominence of the central pulmonary vasculature. Small bilateral pleural effusions are seen, slightly improved from the prior exam. However there remains increased patchy/interstitial opacities seen at both lung bases. No acute osseous abnormality. IMPRESSION: Pulmonary vascular congestion and increased interstitial patchy airspace opacities at both lung bases, not significantly changed since the prior exam which could represent pulmonary edema and/or infectious etiology. Small bilateral pleural effusions. Electronically Signed   By: Prudencio Pair M.D.   On: 12/29/2019 19:14  DG Chest 2 View  Result Date: 12/25/2019 CLINICAL DATA:  Cough.  Shortness of breath. EXAM: CHEST - 2 VIEW COMPARISON:  09/26/2019. FINDINGS: Stable cardiomegaly. Pulmonary venous congestion improved from prior exam. Bilateral interstitial prominence again noted suggesting interstitial edema. Findings have improved slightly from prior exam. Bibasilar atelectasis. Small bilateral pleural effusions again noted. IMPRESSION: 1. Cardiomegaly. Pulmonary venous congestion, improved from prior exam. Bilateral interstitial prominence again noted suggesting pulmonary interstitial edema. Findings have improved slightly from prior exam. Small bilateral pleural effusions again noted. 2.  Bibasilar atelectasis. Electronically Signed   By: Marcello Moores  Register   On: 12/25/2019 13:15   DG Abdomen 1  View  Result Date: 12/29/2019 CLINICAL DATA:  64 year old male with constipation. EXAM: ABDOMEN - 1 VIEW COMPARISON:  None. FINDINGS: Evaluation is limited due to body habitus. There is large amount of stool throughout the colon primarily in the right hemiabdomen. Air distended bowel loops in the left hemiabdomen, likely colonic loops. No definite small bowel dilatation or evidence of obstruction. No free air. Right upper quadrant cholecystectomy clips. Degenerative changes of the spine and lower lumbar fusion hardware. No acute osseous pathology. Small bilateral pleural effusions and bibasilar atelectasis/infiltrate. IMPRESSION: Constipation. No bowel obstruction. Electronically Signed   By: Anner Crete M.D.   On: 12/29/2019 19:10   CT Angio Chest PE W and/or Wo Contrast  Result Date: 12/29/2019 CLINICAL DATA:  64 year old male with increasing shortness of breath. EXAM: CT ANGIOGRAPHY CHEST WITH CONTRAST TECHNIQUE: Multidetector CT imaging of the chest was performed using the standard protocol during bolus administration of intravenous contrast. Multiplanar CT image reconstructions and MIPs were obtained to evaluate the vascular anatomy. CONTRAST:  126mL OMNIPAQUE IOHEXOL 350 MG/ML SOLN COMPARISON:  Chest radiographs earlier tonight and 12/25/2019. [place] Chest CT 06/05/2019. FINDINGS: Cardiovascular: Adequate contrast bolus timing in the pulmonary arterial tree. Only mild respiratory motion. No focal filling defect identified in the pulmonary arteries to suggest acute pulmonary embolism. Stable cardiomegaly since last year. No pericardial effusion. Calcified coronary artery and aortic atherosclerosis. Mediastinum/Nodes: Chronic borderline to mild mediastinal and hilar lymphadenopathy, increased compared to last year. Lungs/Pleura: Major airways are patent. There is nonenhancing bilateral lower lobe and right middle lobe peribronchial and peripheral lung consolidation with additional  bilateral lower lobe inflammatory appearing sub solid nodularity. Small or trace right pleural effusion appears chronic and stable. But trace left costophrenic angle pleural fluid is new from last year. Superimposed major airways remain patent although with some retained secretions in the trachea. Superimposed upper lobe paraseptal and centrilobular emphysema. Superimposed chronic subpleural lung scarring, mostly anteriorly on the right. Upper Abdomen: Negative visible liver, spleen, stomach. Musculoskeletal: Diffuse chronic skeletal sclerosis, such as could be seen with metabolic bone disease or renal osteodystrophy. There are subacute fractures of the left lateral 2nd through 4th ribs. No superimposed acute osseous abnormality identified. Review of the MIP images confirms the above findings. IMPRESSION: 1. Negative for acute pulmonary embolus. 2. Bilateral lower lobe and right middle lobe Bronchopneumonia. Trace left pleural effusion is new, although trace right pleural effusion is chronic. Underlying chronic lung disease including upper lobe Emphysema (ICD10-J43.9). 3. Chronic but increased mediastinal and hilar lymphadenopathy is probably reactive due to #2. 4. Healing fractures of the left lateral 2nd through 4th ribs with suspected underlying renal osteodystrophy or other metabolic bone disease. 5. Cardiomegaly, calcified coronary artery and Aortic atherosclerosis (ICD10-I70.0). Electronically Signed   By: Genevie Ann M.D.   On: 12/29/2019 23:51   DG Chest Portable 1  View  Result Date: 01/09/2020 CLINICAL DATA:  Shortness of breath. EXAM: PORTABLE CHEST 1 VIEW COMPARISON:  CT chest 12/29/2019.  Chest x-ray 12/29/2019. FINDINGS: Severe cardiomegaly again noted. Pulmonary venous congestion. Bilateral interstitial prominence and bilateral pleural effusions. Findings consistent with CHF. Pneumonitis cannot be excluded. No pneumothorax. Degenerative change thoracic spine. IMPRESSION: Severe cardiomegaly again noted.  Pulmonary venous congestion, bilateral interstitial prominence, bilateral pleural effusions are noted consistent with CHF. Pneumonitis cannot be excluded Electronically Signed   By: Marcello Moores  Register   On: 01/09/2020 09:46       Subjective: Shortness of breath has improved.  Feeling significantly better after second dialysis treatment.  No chest pain.  Discharge Exam: Vitals:   01/11/20 1600 01/11/20 1615 01/11/20 1630 01/11/20 1700  BP: 128/66 134/69 (!) 105/55 (!) 107/58  Pulse: (!) 50 62 (!) 51 (!) 52  Resp:    18  Temp:    97.9 F (36.6 C)  TempSrc:    Oral  SpO2:      Weight:    99.3 kg  Height:        General: Pt is alert, awake, not in acute distress Cardiovascular: RRR, S1/S2 +, no rubs, no gallops Respiratory: CTA bilaterally, no wheezing, no rhonchi Abdominal: Soft, NT, ND, bowel sounds + Extremities: Trace edema, no cyanosis    The results of significant diagnostics from this hospitalization (including imaging, microbiology, ancillary and laboratory) are listed below for reference.     Microbiology: Recent Results (from the past 240 hour(s))  SARS Coronavirus 2 by RT PCR (hospital order, performed in [facility] hospital lab) Nasopharyngeal Nasopharyngeal Swab     Status: None   Collection Time: 01/09/20  9:21 AM   Specimen: Nasopharyngeal Swab  Result Value Ref Range Status   SARS Coronavirus 2 NEGATIVE NEGATIVE Final    Comment: (NOTE) SARS-CoV-2 target nucleic acids are NOT DETECTED.  The SARS-CoV-2 RNA is generally detectable in upper and lower respiratory specimens during the acute phase of infection. The lowest concentration of SARS-CoV-2 viral copies this assay can detect is 250 copies / mL. A negative result does not preclude SARS-CoV-2 infection and should not be used as the sole basis for treatment or other patient management decisions.  A negative result may occur with improper specimen collection / handling, submission of specimen other than  nasopharyngeal swab, presence of viral mutation(s) within the areas targeted by this assay, and inadequate number of viral copies (<250 copies / mL). A negative result must be combined with clinical observations, patient history, and epidemiological information.  Fact Sheet for Patients:   StrictlyIdeas.no  Fact Sheet for Healthcare Providers: BankingDealers.co.za  This test is not yet approved or  cleared by the [place] FDA and has been authorized for detection and/or diagnosis of SARS-CoV-2 by FDA under an Emergency Use Authorization (EUA).  This EUA will remain in effect (meaning this test can be used) for the duration of the COVID-19 declaration under Section 564(b)(1) of the Act, 21 U.S.C. section 360bbb-3(b)(1), unless the authorization is terminated or revoked sooner.  Performed at [facility], [address] [place], [place] [address]      Labs: BNP (last 3 results) Recent Labs    12/25/19 1504 01/09/20 1250  BNP 3,151.0* 7,824.2*   Basic Metabolic Panel: Recent Labs  Lab 01/09/20 1250 01/10/20 0653 01/11/20 0724  NA 128* 132* 130*  K 5.0 4.3 4.7  CL 90* 92* 91*  CO2 26 29 28   GLUCOSE 184* 161* 152*  BUN 29*  16 25*  CREATININE 4.75* 3.25* 4.60*  CALCIUM 8.0* 8.1* 7.8*  PHOS  --   --  4.7*   Liver Function Tests: Recent Labs  Lab 01/09/20 1250 01/11/20 0724  AST 13*  --   ALT 16  --   ALKPHOS 355*  --   BILITOT 0.9  --   PROT 7.0  --   ALBUMIN 2.7* 2.8*   No results for input(s): LIPASE, AMYLASE in the last 168 hours. No results for input(s): AMMONIA in the last 168 hours. CBC: Recent Labs  Lab 01/09/20 1250 01/10/20 0653 01/11/20 0724  WBC 7.4 5.2 5.7  NEUTROABS 6.3  --   --   HGB 7.7* 7.9* 7.8*  HCT 26.0* 26.7* 26.2*  MCV 91.5 92.4 92.6  PLT 224 226 215   Cardiac Enzymes: No results for input(s): CKTOTAL, CKMB, CKMBINDEX, TROPONINI in the last 168 hours. BNP: Invalid input(s):  POCBNP CBG: Recent Labs  Lab 01/10/20 1802 01/10/20 2025 01/11/20 0735 01/11/20 1208 01/11/20 1640  GLUCAP 190* 144* 154* 236* 148*   D-Dimer No results for input(s): DDIMER in the last 72 hours. Hgb A1c No results for input(s): HGBA1C in the last 72 hours. Lipid Profile No results for input(s): CHOL, HDL, LDLCALC, TRIG, CHOLHDL, LDLDIRECT in the last 72 hours. Thyroid function studies No results for input(s): TSH, T4TOTAL, T3FREE, THYROIDAB in the last 72 hours.  Invalid input(s): FREET3 Anemia work up No results for input(s): VITAMINB12, FOLATE, FERRITIN, TIBC, IRON, RETICCTPCT in the last 72 hours. Urinalysis    Component Value Date/Time   COLORURINE YELLOW 04/04/2016 [place] 04/04/2016 1445   LABSPEC 1.015 04/04/2016 1445   PHURINE 7.0 04/04/2016 1445   GLUCOSEU 100 (A) 04/04/2016 1445   HGBUR NEGATIVE 04/04/2016 [place] 04/04/2016 1445   KETONESUR NEGATIVE 04/04/2016 1445   PROTEINUR >300 (A) 04/04/2016 1445   NITRITE NEGATIVE 04/04/2016 1445   LEUKOCYTESUR NEGATIVE 04/04/2016 1445   Sepsis Labs Invalid input(s): PROCALCITONIN,  WBC,  LACTICIDVEN Microbiology Recent Results (from the past 240 hour(s))  SARS Coronavirus 2 by RT PCR (hospital order, performed in [place] hospital lab) Nasopharyngeal Nasopharyngeal Swab     Status: None   Collection Time: 01/09/20  9:21 AM   Specimen: Nasopharyngeal Swab  Result Value Ref Range Status   SARS Coronavirus 2 NEGATIVE NEGATIVE Final    Comment: (NOTE) SARS-CoV-2 target nucleic acids are NOT DETECTED.  The SARS-CoV-2 RNA is generally detectable in upper and lower respiratory specimens during the acute phase of infection. The lowest concentration of SARS-CoV-2 viral copies this assay can detect is 250 copies / mL. A negative result does not preclude SARS-CoV-2 infection and should not be used as the sole basis for treatment or other patient management decisions.  A negative  result may occur with improper specimen collection / handling, submission of specimen other than nasopharyngeal swab, presence of viral mutation(s) within the areas targeted by this assay, and inadequate number of viral copies (<250 copies / mL). A negative result must be combined with clinical observations, patient history, and epidemiological information.  Fact Sheet for Patients:   StrictlyIdeas.no  Fact Sheet for Healthcare Providers: BankingDealers.co.za  This test is not yet approved or  cleared by the [place] FDA and has been authorized for detection and/or diagnosis of SARS-CoV-2 by FDA under an Emergency Use Authorization (EUA).  This EUA will remain in effect (meaning this test can be used) for the duration of the COVID-19 declaration under  Section 564(b)(1) of the Act, 21 U.S.C. section 360bbb-3(b)(1), unless the authorization is terminated or revoked sooner.  Performed at [facility], [address], [place], [place] [address]      Time coordinating discharge: 30mins  SIGNED:   Kathie Dike, MD  [facility] 01/11/2020, 8:08 PM   If 7PM-7AM, please contact night-coverage [email or web address]

## 2020-01-11 NOTE — Progress Notes (Signed)
Subjective:  Still with DOE-  Planning for extra sequential HD today for volume only  Objective Vital signs in last 24 hours: Vitals:   01/10/20 2027 01/10/20 2203 01/11/20 0402 01/11/20 0405  BP: (!) 142/70 132/63  (!) 162/65  Pulse: (!) 58 (!) 57  64  Resp: 20   18  Temp: 98.1 F (36.7 C)   97.8 F (36.6 C)  TempSrc: Oral   Oral  SpO2: 100%   98%  Weight:   102.4 kg   Height:       Weight change: -0.113 kg  Intake/Output Summary (Last 24 hours) at 01/11/2020 1247 Last data filed at 01/11/2020 0300 Gross per 24 hour  Intake 360 ml  Output --  Net 360 ml    Assessment/Plan: 64 year old WM-  Multiple medical issues presents with DOE-  CXR showing fluid-  Did not improve with UF yesterday  1 DOE-  Did not improve enough with HD/UF on 8/6 so got admitted-  Echo and other work up per primary team- given lasix but not sure that will be effective.  Will plan to do extra HD today-  Sequential only where we can concentrate on volume removal only -  If feels better after HD today , I am [name] with discharge home to resume regular HD tomorrow at home unit 2 ESRD: normally MWF at [facility] via AVF-  Will do extra treatment today (Sunday) , then back on schedule Monday - can be at OP unit-  Will need EDW lowered quite a bit I think 3 Hypertension: BP not low-   Should support UF 4. Anemia of ESRD: very anemic-  Likely not helping his DOE-  dont know his ESA dose as an OP-  Will give darbe here-   5. Metabolic Bone Disease: no binder on home med list- phos 4.7   Mark [name]    Labs: Basic Metabolic Panel: Recent Labs  Lab 01/09/20 1250 01/10/20 0653 01/11/20 0724  NA 128* 132* 130*  K 5.0 4.3 4.7  CL 90* 92* 91*  CO2 26 29 28   GLUCOSE 184* 161* 152*  BUN 29* 16 25*  CREATININE 4.75* 3.25* 4.60*  CALCIUM 8.0* 8.1* 7.8*  PHOS  --   --  4.7*   Liver Function Tests: Recent Labs  Lab 01/09/20 1250 01/11/20 0724  AST 13*  --   ALT 16  --   ALKPHOS 355*  --   BILITOT  0.9  --   PROT 7.0  --   ALBUMIN 2.7* 2.8*   No results for input(s): LIPASE, AMYLASE in the last 168 hours. No results for input(s): AMMONIA in the last 168 hours. CBC: Recent Labs  Lab 01/09/20 1250 01/10/20 0653 01/11/20 0724  WBC 7.4 5.2 5.7  NEUTROABS 6.3  --   --   HGB 7.7* 7.9* 7.8*  HCT 26.0* 26.7* 26.2*  MCV 91.5 92.4 92.6  PLT 224 226 215   Cardiac Enzymes: No results for input(s): CKTOTAL, CKMB, CKMBINDEX, TROPONINI in the last 168 hours. CBG: Recent Labs  Lab 01/10/20 1128 01/10/20 1802 01/10/20 2025 01/11/20 0735 01/11/20 1208  GLUCAP 177* 190* 144* 154* 236*    Iron Studies: No results for input(s): IRON, TIBC, TRANSFERRIN, FERRITIN in the last 72 hours. Studies/Results: No results found. Medications: Infusions:   Scheduled Medications: . aspirin EC  81 mg Oral Daily  . atorvastatin  40 mg Oral Daily  . Buprenorphine HCl  1 Film Oral BID  . carvedilol  6.25 mg Oral BID WC  . Chlorhexidine Gluconate Cloth  6 each Topical Q0600  . Chlorhexidine Gluconate Cloth  6 each Topical Q0600  . DULoxetine  60 mg Oral Daily  . hydrALAZINE  25 mg Oral TID  . insulin aspart  0-9 Units Subcutaneous TID WC  . polyethylene glycol  17 g Oral Daily  . sucralfate  1 g Oral TID    have reviewed scheduled and prn medications.  Physical Exam: General: NAD- sitting up  Heart: RRR Lungs: dec BS at bases Abdomen: soft, non tender Extremities: pitting edema Dialysis Access: left AVF-  patent    01/11/2020,12:47 PM  LOS: 1 day

## 2020-01-11 NOTE — Progress Notes (Signed)
Reviewed with patient discharge paperwork. No questions from the patient at this time. Patient vital signs obtained. Patient transported in wheelchair to front entrance where wife picked him up at 2047. Patient belongings sent with patient.

## 2020-01-12 LAB — GLUCOSE, CAPILLARY: Glucose-Capillary: 163 mg/dL — ABNORMAL HIGH (ref 70–99)

## 2020-01-14 NOTE — Progress Notes (Addendum)
Cardiology Office Note  Date: 01/15/2020   ID: Mark [name], DOB Oct 08, 1955, MRN [id number]  PCP:  Leeanne Rio, MD  Cardiologist:  No primary care provider on file. Electrophysiologist:  None   Chief Complaint: Follow-up chronic systolic heart failure, CAD, ESRD on dialysis  History of Present Illness: Mark [name] is a 64 y.o. male with a history of CAD, chronic systolic heart failure, ESRD, HLD, HTN.  His last encounter was with Dr. Bronson Ing on 07/15/2019 he denied any chest pain or shortness of breath.  He did state that this only seemed to occur immediately prior to dialysis due to hypervolemia.  His blood pressure is usually low immediately after dialysis.  EF was 35 to 40% by echo in April 2020.  He did not tolerate long-acting nitrates.  Unable to use beta-blockers secondary to long history of bradycardia.  Unable to use ACEs or ARB secondary to end-stage renal disease.  He was continue his Lipitor.  His blood pressure was markedly elevated on that visit but he had not taken his morning medications.  His coronary artery disease was symptomatically stable.  His nuclear stress test on 12/12/2018 showed evidence of MI with minimal peri-infarct ischemia.  Patient was here last status post hospital stay for anterior/posterior L3-S1 ORIF.  After extubation patient converted to ventricular tachycardia in the OR suite.  He was cardioverted and started on amiodarone.  Cardiology was consulted to manage.  EP was also consulted and recommended outpatient catheterization for further management if he would benefit from defibrillator placement.  EP mentioned that in the future they could do an EP study and if able to induce ventricular tachycardia, defibrillator could be implanted.  Meanwhile advised to continue with medical therapy for now until he went to rehab including carvedilol and possible amiodarone.  He has ischemic cardiomyopathy with LVEF of 35 to 40%. He received blood  transfusion for anemia while in the hospital.  Had been receiving inpatient hemodialysis as well.  Patient stated he was going 3-4 times per week to dialysis for 4.5 hours each time to decrease the fluid.  Patient states his blood pressure is always better after dialysis.  Blood pressure on arrival today is 150/62.  He was in a wheelchair with supplemental O2.  He denied any palpitations or arrhythmias.  Recently admitted for dyspnea and hypoxia after HD. He had been experiencing progressively worsening dyspnea 3 weeks prior to presentation.  He was being treated for pneumonia.  He was complaining of productive cough, and lower extremity edema.  He was volume overloaded.  He was dialyzed x 2 during admission.  He had substantial fluid removed during dialysis episodes while in hospital.  He had a repeat echocardiogram during admission.  The results of which have not been released.   Has significant anemia with H/H 7.8 / 26.2 at discharge. Receives darbepoetin.  He presents today complaining of significant fatigue.  He is resting in a wheelchair with supplemental O2.  He has very minimal lower extremity edema today.  He is on amoxicillin for abscessed tooth.    Past Medical History:  Diagnosis Date  . Anxiety    occas. panic attack, takes xanax occas  . Arthritis    herniated disc, lumbar   . Back pain   . CAD (coronary artery disease)    03/25/2015 95% prox to mid LAD treated with Promus Premier DES 3.5x37mm postdilated to 4.32mm, 100% prox to distal RCA lesion filled in the PDA region via collaterals  from LAD and PLA  . Cancer ([place])    - skin ca on face- removed   . CHF (congestive heart failure) ([place])   . Chronic kidney disease   . Complication of anesthesia    " it takes a long time to get over it."   . Diabetes mellitus without complication ([place])    type 2  . Dialysis patient ([place])   . Fibromyalgia   . GERD (gastroesophageal reflux disease)    otc- pepcid , approx. every other  day    .  H/O acute respiratory failure   . Hematuria    being followed by Dr. Hinda Lenis for decreased kidney function   . History of blood product transfusion 02/13/2014   After having back surgery  . Hypercholesteremia   . Hypertension   . Myocardial infarction ([place]) 03/2015  . Pneumonia    "walking"  . Shortness of breath dyspnea   . Sleep apnea    test aborted, due to not able to relax , since the aborted test he had surgery for gallbladder & he reports that he was told that he has sleep apnea     Past Surgical History:  Procedure Laterality Date  . A/V FISTULAGRAM Left 10/31/2016   Procedure: A/V Fistulagram;  Surgeon: Serafina Mitchell, MD;  Location: [place] [facility];  Service: Cardiovascular;  Laterality: Left;  . AV FISTULA PLACEMENT Left 02/02/2015   Procedure: LEFT ARM RADIOCEPHALIC ARTERIOVENOUS (AV) FISTULA CREATION;  Surgeon: Conrad Rollinsville, MD;  Location: [facility];  Service: Vascular;  Laterality: Left;  . AV FISTULA PLACEMENT Left 05/25/2016   Procedure: ARTERIOVENOUS (AV) FISTULA CREATION;  Surgeon: [name], MD;  Location: [facility];  Service: Vascular;  Laterality: Left;  . BACK SURGERY  02/13/14  . BIOPSY  03/08/2018   Procedure: BIOPSY;  Surgeon: Rogene Houston, MD;  [facility];  Service: Endoscopy;;  gastric  . CARDIAC CATHETERIZATION N/A 03/25/2015   Procedure: Left Heart Cath and Coronary Angiography;  Surgeon: Leonie Man, MD;  Location: [facility] [facility];  Service: Cardiovascular;  Laterality: N/A;  . CARDIAC CATHETERIZATION N/A 03/25/2015   Procedure: Coronary Stent Intervention;  Surgeon: Leonie Man, MD;  Location: [place] [facility] [facility];  Service: Cardiovascular;  Laterality: N/A;  . CHOLECYSTECTOMY    . COLONOSCOPY    . COLONOSCOPY WITH PROPOFOL N/A 03/08/2018   Procedure: COLONOSCOPY WITH PROPOFOL;  Surgeon: Rogene Houston, MD;  [facility];  Service: Endoscopy;  Laterality: N/A;  11:10  . EYE SURGERY     cataracts remove,  bilateral, w/IOL.  Marland Kitchen INSERTION OF DIALYSIS CATHETER N/A 03/09/2015   Procedure: INSERTION OF DIALYSIS CATHETER;  Surgeon: Conrad Sabula, MD;  Location: [facility];  Service: Vascular;  Laterality: N/A;  . INSERTION OF DIALYSIS CATHETER Left 04/07/2016   Procedure: INSERTION OF TUNNELED DIALYSIS CATHETER LEFT INTERNAL JUGULAR, ATTEMPTED INSERTION OF TUNNELLED CATHETER RIGHT INTERNAL JUGULAR;  Surgeon: [name], MD;  [facility];  Service: Vascular;  Laterality: Left;  . IR GENERIC HISTORICAL  07/28/2016   IR FLUORO GUIDE CV LINE LEFT 07/28/2016 Greggory Keen, MD [facility]  . IR REMOVAL TUN CV CATH W/O FL  03/28/2017  . LUMBAR LAMINECTOMY/DECOMPRESSION MICRODISCECTOMY Right 02/13/2014   Procedure: RIGHT LUMBAR FOUR-FIVE, RIGHT LUMBAR FIVE- SACRAL ONE LUMBAR LAMINECTOMY/DECOMPRESSION MICRODISCECTOMY ;  Surgeon: Ashok Pall, MD;  Location: [facility];  Service: Neurosurgery;  Laterality: Right;  Right L4-5 and Right L5-S1 diskectomies  .  PERIPHERAL VASCULAR BALLOON ANGIOPLASTY Left 10/31/2016   Procedure: Peripheral Vascular Balloon Angioplasty;  Surgeon: Serafina Mitchell, MD;  Location: [place] [facility] [facility];  Service: Cardiovascular;  Laterality: Left;  AV fistula  . PERIPHERAL VASCULAR BALLOON ANGIOPLASTY Left 01/28/2019   Procedure: PERIPHERAL VASCULAR BALLOON ANGIOPLASTY;  Surgeon: Serafina Mitchell, MD;  Location: [facility] [facility];  Service: Cardiovascular;  Laterality: Left;  arm fistula  . TEE WITHOUT CARDIOVERSION N/A 05/02/2016   Procedure: TRANSESOPHAGEAL ECHOCARDIOGRAM (TEE);  Surgeon: Herminio Commons, MD;  [facility];  Service: Cardiovascular;  Laterality: N/A;  . TONSILLECTOMY      Current Outpatient Medications  Medication Sig Dispense Refill  . albuterol (PROAIR HFA) 108 (90 Base) MCG/ACT inhaler Inhale 2 puffs into the lungs every 4 (four) hours as needed.    Marland Kitchen amoxicillin (AMOXIL) 875 MG tablet Take 875 mg by mouth 2 (two) times daily.    Marland Kitchen aspirin EC 81 MG  tablet Take 1 tablet (81 mg total) by mouth daily.    Marland Kitchen atorvastatin (LIPITOR) 40 MG tablet Take 1 tablet (40 mg total) by mouth daily.    Marland Kitchen BELBUCA 900 MCG FILM Take 1 Film by mouth 2 (two) times daily.    . betamethasone dipropionate (DIPROLENE) 0.05 % cream Apply 1 application topically 2 (two) times daily as needed (irritation).    . carvedilol (COREG) 6.25 MG tablet Take 1 tablet by mouth in the morning and at bedtime.    . cloNIDine (CATAPRES) 0.1 MG tablet Take 0.1 mg by mouth daily.    . DULoxetine (CYMBALTA) 60 MG capsule Take 60 mg by mouth daily.     . hydrALAZINE (APRESOLINE) 25 MG tablet TAKE ONE TABLET BY MOUTH THREE TIMES DAILY 90 tablet 6  . levothyroxine (SYNTHROID) 100 MCG tablet Take 100 mcg by mouth daily.    Marland Kitchen lidocaine-prilocaine (EMLA) cream Apply 1 application topically as needed (before dialysis).    . nitroGLYCERIN (NITROSTAT) 0.4 MG SL tablet Place 1 tablet (0.4 mg total) under the tongue every 5 (five) minutes x 3 doses as needed for chest pain (If no relief after 3rd dose, proceed to the ED for an evaluation). I 25 tablet 0  . OXYGEN Inhale 3.5 L into the lungs continuous.     . polyethylene glycol (MIRALAX / GLYCOLAX) 17 g packet Take 17 g by mouth as needed.     . sucralfate (CARAFATE) 1 g tablet Take 1 g by mouth 3 (three) times daily.    . traMADol (ULTRAM) 50 MG tablet Take 50 mg by mouth 2 (two) times daily as needed.     No current facility-administered medications for this visit.   Facility-Administered Medications Ordered in Other Visits  Medication Dose Route Frequency Provider Last Rate Last Admin  . 0.9 %  sodium chloride infusion   Intravenous Continuous Monia Sabal, PA-C   New Bag at 03/08/18 0920  . ceFAZolin (ANCEF) 2 g in dextrose 5 % 100 mL IVPB  2 g Intravenous Once Monia Sabal, PA-C       Allergies:  Morphine and related and Varenicline   Social History: The patient  reports that he has been smoking cigarettes. He started smoking about  46 years ago. He has a 20.00 pack-year smoking history. He has never used smokeless tobacco. He reports that he does not drink alcohol and does not use drugs.   Family History: The patient's family history includes Cancer in his father; Colon cancer in his maternal grandfather; Heart  attack in his maternal grandfather; Heart failure in his maternal grandmother; Prostate cancer in his father. He was adopted.   ROS:  Please see the history of present illness. Otherwise, complete review of systems is positive for none.  All other systems are reviewed and negative.   Physical Exam: VS:  BP 128/60   Pulse (!) 55   Ht 5\' 11"  (1.803 m)   Wt 219 lb (99.3 kg)   SpO2 100%   BMI 30.54 kg/m , BMI Body mass index is 30.54 kg/m.  Wt Readings from Last 3 Encounters:  01/15/20 219 lb (99.3 kg)  01/11/20 218 lb 14.7 oz (99.3 kg)  12/29/19 (!) 240 lb (108.9 kg)    General:  Patient appears comfortable at rest. Neck: Supple, no elevated JVP or carotid bruits, no thyromegaly. Lungs: Clear to auscultation, nonlabored breathing at rest. Cardiac: Regular rate and rhythm, no S3 or significant systolic murmur, no pericardial rub. Extremities: Mild nonpitting edema, distal pulses 2+. Skin: Warm and dry. Musculoskeletal: No kyphosis. Neuropsychiatric: Alert and oriented x3, affect grossly appropriate.  ECG:  EKG on 01/09/2020 showed sinus rhythm rate of 96, prolonged PR interval at 0.21, probable left atrial enlargement, intraventricular conduction defect, consider atypical left bundle branch block.  Recent Labwork: 01/09/2020: ALT 16; AST 13; B Natriuretic Peptide 4,023.0 01/11/2020: BUN 25; Creatinine, Ser 4.60; Hemoglobin 7.8; Platelets 215; Potassium 4.7; Sodium 130     Component Value Date/Time   CHOL 111 02/13/2015 0225   TRIG 54 02/13/2015 0225   HDL 31 (L) 02/13/2015 0225   CHOLHDL 3.6 02/13/2015 0225   VLDL 11 02/13/2015 0225   LDLCALC 69 02/13/2015 0225    Other Studies Reviewed  Today:  Echocardi[facility] 09/24/2019 at [facility] SUMMARY  There is moderate eccentric left ventricular hypertrophy with basal and mid inferoseptal akinesis and moderate hypokinesis of remaining visualized segments of the left ventricle with probably moderately reduced systolic function. LV ejection fraction 35-40%. Left ventricular filling pattern is indeterminate. The left atrium is moderately dilated. There is mild aortic stenosis. RVSP not able to be calculated. LV and RV function appear to be improved compared with 2020 study. - FINDINGS: LEFT VENTRICLE The left ventricle is mildly dilated. There is moderate eccentric left ventricular hypertrophy. Left ventricular systolic function is moderately reduced. LV ejection fraction = 35-40%. Left ventricular filling pattern is indeterminate. There is moderate global hypokinesis of the left ventricle. Basal and mid inferoseptal akinesis.  - RIGHT VENTRICLE The right ventricle is normal size. The right ventricular systolic function is normal.  LEFT ATRIUM The left atrium is moderately dilated.  RIGHT ATRIUM Right atrium not well visualized. - AORTIC VALVE The aortic valve is normal in structure and function. The aortic valve is trileaflet. There is mild aortic stenosis. There is trace aortic regurgitation. - MITRAL VALVE The mitral valve is normal in structure and function. The mitral valve leaflets appear thickened, but open well. There is trace mitral regurgitation. - TRICUSPID VALVE The tricuspid valve is not well visualized. There is trace tricuspid regurgitation. RVSP not able to be calculated. - PULMONIC VALVE The pulmonic valve is not well visualized. - ARTERIES The aortic sinus is normal size.   Coronary angiography on 03/25/15 demonstrated a 95% stenosis in the proximal to mid LAD for which a drug-eluting stent was placed. He also had 100% stenosis of the proximal to distal  RCA lesion. The RCA filled in the PDA region via collaterals from the LAD and PL via the  circumflex. Long-term Brilinta was recommended.   Echo (10/02/18 at [facility])  SUMMARY The left ventricle is severely dilated. Mild left ventricular hypertrophy  Left ventricular systolic function is moderately reduced. LV ejection fraction = 35-40%. Unable to fully assess LV regional wall motion The right ventricle is normal size. The right ventricular systolic function is normal. The left atrium is mildly to moderately dilated. The right atrium is mildly dilated. There is mild aortic stenosis. There was insufficient TR detected to calculate RV systolic pressure. There is mild tricuspid regurgitation. No significant stenosis seen Estimated right atrial pressure is 10 mmHg.. Mild to moderate pulmonic valvular regurgitation. There is no pericardial effusion. There is a pleural effusion present. Probably no significant change in comparison with the prior study noted  Nuclear stress test 12/12/2018:   There was no ST segment deviation noted during stress.  Findings consistent with prior moderate inferior myocardial infarction with minimal peri-infarct ischemia.  This is an intermediate risk study. Risk based primarily on decreased LVEF, fairly minimal amout of myocardium currently at jeopardy.  The left ventricular ejection fraction is moderately decreased (30-44%).    Assessment and Plan:   1. Chronic systolic (congestive) heart failure (HCC) Recent admission for acute on chronic systolic heart failure.  Patient was dialyzed x2 during hospital stay with significant fluid removal.  Repeat echocardiogram was performed which has not been released at this time.  Patient and wife stated there has been discussion in the past of possible ICD placement due to low EF and history of V. tach.  Previous echo from [facility] earlier this year showed EF of 35 to 40%.  Advised patient and his  wife we would notify them of echo results once we receive the report.  2. Hyperlipidemia LDL goal <70 Continue atorvastatin 40 mg daily.  3. Essential hypertension Blood pressure today 128/60.  He had dialysis today.  Patient states once he has dialysis, his blood pressure gets better.  Continue hydralazine 25 mg p.o. twice daily.  Clonidine 0.1 mg p.o. daily, carvedilol 6.25 mg p.o. twice daily  4. ESRD (end stage renal disease) on dialysis ([place]) Continues with hemodialysis 3-4 times per week at 4.5 hours each.  Has a left upper extremity AV fistula with positive thrill and bruit.  Patient states his dry weight is 98/99 kg.  He states today after dialysis he was at his dry weight.  5. CAD in native artery 03/31/2015.  95% proximal to mid LAD treated with Promus Premier DES 3.5 x 16 mm postdilated to 4.1 mm, 100% proximal to distal RCA lesion filled in the PDA region via collaterals from LAD and PLA.  Patient Denies any anginal or exertional symptoms.  He is not very active.  He had a recent episode of sustained ventricular tachycardia in the operating suite after back surgery. He was defibrillated with 200 J.  Given IV amiodarone and calcium.  He had occasional NSVT afterwards while being monitored on telemetry.   Continue aspirin 81 mg.  Carvedilol 6.25 mg p.o. twice daily.  Nitroglycerin sublingual 0.4 mg as needed for chest pain.   Medication Adjustments/Labs and Tests Ordered: Current medicines are reviewed at length with the patient today.  Concerns regarding medicines are outlined above.   Disposition: Follow-up with Dr. Harl Bowie or APP 3 months.  Signed, Levell July, NP 01/15/2020 4:41 PM    [facility] at [place], [place], [place] [address] Phone: [phone]; Fax: [phone]

## 2020-01-15 ENCOUNTER — Encounter: Payer: Self-pay | Admitting: Family Medicine

## 2020-01-15 ENCOUNTER — Ambulatory Visit: Payer: Medicare Other | Admitting: Cardiovascular Disease

## 2020-01-15 ENCOUNTER — Ambulatory Visit (INDEPENDENT_AMBULATORY_CARE_PROVIDER_SITE_OTHER): Payer: Medicare Other | Admitting: Family Medicine

## 2020-01-15 VITALS — BP 128/60 | HR 55 | Ht 71.0 in | Wt 219.0 lb

## 2020-01-15 DIAGNOSIS — D631 Anemia in chronic kidney disease: Secondary | ICD-10-CM

## 2020-01-15 DIAGNOSIS — I10 Essential (primary) hypertension: Secondary | ICD-10-CM

## 2020-01-15 DIAGNOSIS — I5023 Acute on chronic systolic (congestive) heart failure: Secondary | ICD-10-CM | POA: Diagnosis not present

## 2020-01-15 DIAGNOSIS — N186 End stage renal disease: Secondary | ICD-10-CM

## 2020-01-15 DIAGNOSIS — N189 Chronic kidney disease, unspecified: Secondary | ICD-10-CM | POA: Diagnosis not present

## 2020-01-15 DIAGNOSIS — I2510 Atherosclerotic heart disease of native coronary artery without angina pectoris: Secondary | ICD-10-CM

## 2020-01-15 DIAGNOSIS — Z992 Dependence on renal dialysis: Secondary | ICD-10-CM

## 2020-01-15 NOTE — Patient Instructions (Addendum)
Medication Instructions:  Your physician recommends that you continue on your current medications as directed. Please refer to the Current Medication list given to you today.  *If you need a refill on your cardiac medications before your next appointment, please call your pharmacy*   Lab Work: None ordered  If you have labs (blood work) drawn today and your tests are completely normal, you will receive your results only by: Marland Kitchen MyChart Message (if you have MyChart) OR . A paper copy in the mail If you have any lab test that is abnormal or we need to change your treatment, we will call you to review the results.   Testing/Procedures: None ordered   Follow-Up: At [facility], you and your health needs are our priority.  As part of our continuing mission to provide you with exceptional heart care, we have created designated Provider Care Teams.  These Care Teams include your primary Cardiologist (physician) and Advanced Practice Providers (APPs -  Physician Assistants and Nurse Practitioners) who all work together to provide you with the care you need, when you need it.  We recommend signing up for the patient portal called "MyChart".  Sign up information is provided on this After Visit Summary.  MyChart is used to connect with patients for Virtual Visits (Telemedicine).  Patients are able to view lab/test results, encounter notes, upcoming appointments, etc.  Non-urgent messages can be sent to your provider as well.   To learn more about what you can do with MyChart, go to [email or web address].    Your next appointment:   3 month(s)  The format for your next appointment:   In Person  Provider:   Rozann Lesches, MD or Katina Dung, NP   Other Instructions

## 2020-01-16 LAB — ECHOCARDIOGRAM COMPLETE
AR max vel: 1.57 cm2
AV Area VTI: 1.72 cm2
AV Area mean vel: 1.6 cm2
AV Mean grad: 15 mmHg
AV Peak grad: 29.2 mmHg
Ao pk vel: 2.7 m/s
Area-P 1/2: 4.31 cm2
Calc EF: 40 %
Height: 71 in
S' Lateral: 5.55 cm
Single Plane A2C EF: 37.8 %
Single Plane A4C EF: 43.4 %
Weight: 3555.58 oz

## 2020-01-19 ENCOUNTER — Telehealth: Payer: Self-pay | Admitting: Family Medicine

## 2020-01-19 NOTE — Telephone Encounter (Signed)
Pt's wife Daine Floras called requesting results from Echo that pt had while he was admitted-- please call [phone]

## 2020-01-19 NOTE — Telephone Encounter (Signed)
Echo results were not released when pt had appt are now available in epic - will forward to provider

## 2020-01-19 NOTE — Telephone Encounter (Signed)
Pt and wife voiced understanding    Please call the patient and tell he and his wife that the pumping function is still a little weak but it has improved some since the previous echocardiogram in 2017. The most recent pumping function said his ejection fraction was 40 to 45%. Back in 2017 it was 35 to 40%. He has some wall motion abnormalities where he has global hypokinesis. This means there is decreased movement in the walls of the ventricle. Tell him this was present on his last echocardiogram as well. He has moderate left ventricular hypertrophy. This means the the main pumping chamber is muscular and stiff. He has grade 2 diastolic dysfunction. This means the heart has problems relaxing when it is trying to fill with blood. This was not present on the previous echo. This can cause increased shortness of breath. When combined with his end-stage renal disease anytime the fluid accumulates he can get short of breath. He has mild aortic stenosis. This means the valve leaving the heart to go out into the body is a little narrow. He also has some very mild leaking of the mitral valve. This is better than the previous echo when it was mild to moderate. Thank you

## 2020-02-17 ENCOUNTER — Encounter (HOSPITAL_COMMUNITY): Payer: Self-pay | Admitting: *Deleted

## 2020-02-17 ENCOUNTER — Emergency Department (HOSPITAL_COMMUNITY): Payer: Medicare Other

## 2020-02-17 ENCOUNTER — Emergency Department (HOSPITAL_COMMUNITY)
Admission: EM | Admit: 2020-02-17 | Discharge: 2020-02-17 | Disposition: A | Discharge status: Home / Self Care | Payer: Medicare Other | Attending: Emergency Medicine | Admitting: Emergency Medicine

## 2020-02-17 DIAGNOSIS — Z5321 Procedure and treatment not carried out due to patient leaving prior to being seen by health care provider: Secondary | ICD-10-CM | POA: Insufficient documentation

## 2020-02-17 DIAGNOSIS — M7989 Other specified soft tissue disorders: Secondary | ICD-10-CM | POA: Insufficient documentation

## 2020-02-17 NOTE — ED Triage Notes (Signed)
Swelling right leg onset 0300 today

## 2020-04-12 ENCOUNTER — Emergency Department (HOSPITAL_COMMUNITY): Payer: Medicare Other

## 2020-04-12 ENCOUNTER — Other Ambulatory Visit: Payer: Self-pay

## 2020-04-12 ENCOUNTER — Encounter (HOSPITAL_COMMUNITY): Payer: Self-pay

## 2020-04-12 ENCOUNTER — Emergency Department (HOSPITAL_COMMUNITY)
Admission: EM | Admit: 2020-04-12 | Discharge: 2020-04-12 | Disposition: A | Discharge status: Home / Self Care | Payer: Medicare Other | Attending: Emergency Medicine | Admitting: Emergency Medicine

## 2020-04-12 DIAGNOSIS — S301XXA Contusion of abdominal wall, initial encounter: Secondary | ICD-10-CM

## 2020-04-12 DIAGNOSIS — N189 Chronic kidney disease, unspecified: Secondary | ICD-10-CM | POA: Insufficient documentation

## 2020-04-12 DIAGNOSIS — M5416 Radiculopathy, lumbar region: Secondary | ICD-10-CM

## 2020-04-12 DIAGNOSIS — E119 Type 2 diabetes mellitus without complications: Secondary | ICD-10-CM | POA: Insufficient documentation

## 2020-04-12 DIAGNOSIS — I509 Heart failure, unspecified: Secondary | ICD-10-CM | POA: Insufficient documentation

## 2020-04-12 DIAGNOSIS — I130 Hypertensive heart and chronic kidney disease with heart failure and stage 1 through stage 4 chronic kidney disease, or unspecified chronic kidney disease: Secondary | ICD-10-CM | POA: Insufficient documentation

## 2020-04-12 DIAGNOSIS — F17210 Nicotine dependence, cigarettes, uncomplicated: Secondary | ICD-10-CM | POA: Insufficient documentation

## 2020-04-12 DIAGNOSIS — I2510 Atherosclerotic heart disease of native coronary artery without angina pectoris: Secondary | ICD-10-CM | POA: Insufficient documentation

## 2020-04-12 DIAGNOSIS — R109 Unspecified abdominal pain: Secondary | ICD-10-CM | POA: Diagnosis not present

## 2020-04-12 DIAGNOSIS — M25552 Pain in left hip: Secondary | ICD-10-CM | POA: Diagnosis present

## 2020-04-12 DIAGNOSIS — R778 Other specified abnormalities of plasma proteins: Secondary | ICD-10-CM

## 2020-04-12 LAB — CBC
HCT: 30.4 % — ABNORMAL LOW (ref 39.0–52.0)
Hemoglobin: 9.1 g/dL — ABNORMAL LOW (ref 13.0–17.0)
MCH: 27.8 pg (ref 26.0–34.0)
MCHC: 29.9 g/dL — ABNORMAL LOW (ref 30.0–36.0)
MCV: 93 fL (ref 80.0–100.0)
Platelets: 241 10*3/uL (ref 150–400)
RBC: 3.27 MIL/uL — ABNORMAL LOW (ref 4.22–5.81)
RDW: 16.4 % — ABNORMAL HIGH (ref 11.5–15.5)
WBC: 8.9 10*3/uL (ref 4.0–10.5)
nRBC: 0 % (ref 0.0–0.2)

## 2020-04-12 LAB — COMPREHENSIVE METABOLIC PANEL
ALT: 15 U/L (ref 0–44)
AST: 20 U/L (ref 15–41)
Albumin: 2.3 g/dL — ABNORMAL LOW (ref 3.5–5.0)
Alkaline Phosphatase: 109 U/L (ref 38–126)
Anion gap: 9 (ref 5–15)
BUN: 31 mg/dL — ABNORMAL HIGH (ref 8–23)
CO2: 27 mmol/L (ref 22–32)
Calcium: 8.5 mg/dL — ABNORMAL LOW (ref 8.9–10.3)
Chloride: 93 mmol/L — ABNORMAL LOW (ref 98–111)
Creatinine, Ser: 4.28 mg/dL — ABNORMAL HIGH (ref 0.61–1.24)
GFR, Estimated: 15 mL/min — ABNORMAL LOW (ref >60)
Glucose, Bld: 117 mg/dL — ABNORMAL HIGH (ref 70–99)
Potassium: 4.9 mmol/L (ref 3.5–5.1)
Sodium: 129 mmol/L — ABNORMAL LOW (ref 135–145)
Total Bilirubin: 0.8 mg/dL (ref 0.3–1.2)
Total Protein: 6.7 g/dL (ref 6.5–8.1)

## 2020-04-12 LAB — TROPONIN I (HIGH SENSITIVITY)
Troponin I (High Sensitivity): 236 ng/L (ref <18)
Troponin I (High Sensitivity): 257 ng/L (ref <18)

## 2020-04-12 LAB — LIPASE, BLOOD: Lipase: 32 U/L (ref 11–51)

## 2020-04-12 LAB — BRAIN NATRIURETIC PEPTIDE: B Natriuretic Peptide: 3562 pg/mL — ABNORMAL HIGH (ref 0.0–100.0)

## 2020-04-12 MED ORDER — FENTANYL CITRATE (PF) 100 MCG/2ML IJ SOLN
50.0000 ug | Freq: Once | INTRAMUSCULAR | Status: AC
  Administered 2020-04-12: 50 ug via INTRAVENOUS
  Filled 2020-04-12: qty 2

## 2020-04-12 MED ORDER — METHOCARBAMOL 500 MG PO TABS
1000.0000 mg | ORAL_TABLET | Freq: Once | ORAL | Status: AC
  Administered 2020-04-12: 1000 mg via ORAL
  Filled 2020-04-12: qty 2

## 2020-04-12 MED ORDER — PREDNISONE 20 MG PO TABS: 20.0000 mg | ORAL_TABLET | Freq: Two times a day (BID) | ORAL | 0 refills | Status: DC

## 2020-04-12 MED ORDER — ACETAMINOPHEN 500 MG PO TABS
1000.0000 mg | ORAL_TABLET | Freq: Once | ORAL | Status: AC
  Administered 2020-04-12: 1000 mg via ORAL
  Filled 2020-04-12: qty 2

## 2020-04-12 MED ORDER — IOHEXOL 300 MG/ML  SOLN
100.0000 mL | Freq: Once | INTRAMUSCULAR | Status: AC | PRN
  Administered 2020-04-12: 100 mL via INTRAVENOUS

## 2020-04-12 MED ORDER — PREDNISONE 50 MG PO TABS
60.0000 mg | ORAL_TABLET | Freq: Once | ORAL | Status: AC
  Administered 2020-04-12: 60 mg via ORAL
  Filled 2020-04-12: qty 1

## 2020-04-12 MED ORDER — FENTANYL CITRATE (PF) 100 MCG/2ML IJ SOLN
150.0000 ug | Freq: Once | INTRAMUSCULAR | Status: AC
  Administered 2020-04-12: 150 ug via INTRAVENOUS
  Filled 2020-04-12: qty 4

## 2020-04-12 MED ORDER — LIDOCAINE 5 % EX PTCH
1.0000 | MEDICATED_PATCH | CUTANEOUS | Status: DC
  Administered 2020-04-12: 1 via TRANSDERMAL
  Filled 2020-04-12: qty 1

## 2020-04-12 MED ORDER — FENTANYL CITRATE (PF) 100 MCG/2ML IJ SOLN
100.0000 ug | Freq: Once | INTRAMUSCULAR | Status: AC
  Administered 2020-04-12: 100 ug via INTRAVENOUS
  Filled 2020-04-12: qty 2

## 2020-04-12 NOTE — ED Provider Notes (Signed)
[facility] Emergency Department Provider Note MRN:  [id number]  Arrival date & time: 04/12/20     Chief Complaint   Hip Pain   History of Present Illness   DREAM NODAL is a 64 y.o. year-old male with a history of CAD, CHF, diabetes, ESRD presenting to the ED with chief complaint of hip pain.  Patient is endorsing left-sided pain from the axilla all the way down to the left hip.  Present for the past several days.  Had a fall a few days ago but explains that the pain started prior to the fall.  Denies any numbness or weakness to the arms or legs, no chest pain or shortness of breath, no abdominal pain.  The worst of the pain is located in the left hip, difficulty moving the hip due to the pain.  Denies fever.  Pain is currently 8 out of 10, constant.   Review of Systems  A complete 10 system review of systems was obtained and all systems are negative except as noted in the HPI and PMH.   Patient's Health History    Past Medical History:  Diagnosis Date  . Anxiety    occas. panic attack, takes xanax occas  . Arthritis    herniated disc, lumbar   . Back pain   . CAD (coronary artery disease)    03/25/2015 95% prox to mid LAD treated with Promus Premier DES 3.5x66mm postdilated to 4.44mm, 100% prox to distal RCA lesion filled in the PDA region via collaterals from LAD and PLA  . Cancer ([place])    - skin ca on face- removed   . CHF (congestive heart failure) ([place])   . Chronic kidney disease   . Complication of anesthesia    " it takes a long time to get over it."   . Diabetes mellitus without complication ([place])    type 2  . Dialysis patient ([place])   . Fibromyalgia   . GERD (gastroesophageal reflux disease)    otc- pepcid , approx. every other  day    . H/O acute respiratory failure   . Hematuria    being followed by Dr. Hinda Lenis for decreased kidney function   . History of blood product transfusion 02/13/2014   After having back surgery  .  Hypercholesteremia   . Hypertension   . Myocardial infarction ([place]) 03/2015  . Pneumonia    "walking"  . Shortness of breath dyspnea   . Sleep apnea    test aborted, due to not able to relax , since the aborted test he had surgery for gallbladder & he reports that he was told that he has sleep apnea     Past Surgical History:  Procedure Laterality Date  . A/V FISTULAGRAM Left 10/31/2016   Procedure: A/V Fistulagram;  Surgeon: Serafina Mitchell, MD;  Location: [place] [facility];  Service: Cardiovascular;  Laterality: Left;  . AV FISTULA PLACEMENT Left 02/02/2015   Procedure: LEFT ARM RADIOCEPHALIC ARTERIOVENOUS (AV) FISTULA CREATION;  Surgeon: Conrad Osborne, MD;  Location: [place];  Service: Vascular;  Laterality: Left;  . AV FISTULA PLACEMENT Left 05/25/2016   Procedure: ARTERIOVENOUS (AV) FISTULA CREATION;  Surgeon: [name], MD;  Location: [facility];  Service: Vascular;  Laterality: Left;  . BACK SURGERY  02/13/14  . BIOPSY  03/08/2018   Procedure: BIOPSY;  Surgeon: Rogene Houston, MD;  [facility];  Service: Endoscopy;;  gastric  . CARDIAC CATHETERIZATION N/A 03/25/2015  Procedure: Left Heart Cath and Coronary Angiography;  Surgeon: Leonie Man, MD;  Location: [place] [facility];  Service: Cardiovascular;  Laterality: N/A;  . CARDIAC CATHETERIZATION N/A 03/25/2015   Procedure: Coronary Stent Intervention;  Surgeon: Leonie Man, MD;  Location: [place] [facility];  Service: Cardiovascular;  Laterality: N/A;  . CHOLECYSTECTOMY    . COLONOSCOPY    . COLONOSCOPY WITH PROPOFOL N/A 03/08/2018   Procedure: COLONOSCOPY WITH PROPOFOL;  Surgeon: Rogene Houston, MD;  [facility];  Service: Endoscopy;  Laterality: N/A;  11:10  . EYE SURGERY     cataracts remove, bilateral, w/IOL.  Marland Kitchen INSERTION OF DIALYSIS CATHETER N/A 03/09/2015   Procedure: INSERTION OF DIALYSIS CATHETER;  Surgeon: Conrad Broomfield, MD;  Location: [place];  Service: Vascular;  Laterality: N/A;  .  INSERTION OF DIALYSIS CATHETER Left 04/07/2016   Procedure: INSERTION OF TUNNELED DIALYSIS CATHETER LEFT INTERNAL JUGULAR, ATTEMPTED INSERTION OF TUNNELLED CATHETER RIGHT INTERNAL JUGULAR;  Surgeon: [name], MD;  Location: [facility];  Service: Vascular;  Laterality: Left;  . IR GENERIC HISTORICAL  07/28/2016   IR FLUORO GUIDE CV LINE LEFT 07/28/2016 Greggory Keen, MD [facility]  . IR REMOVAL TUN CV CATH W/O FL  03/28/2017  . LUMBAR LAMINECTOMY/DECOMPRESSION MICRODISCECTOMY Right 02/13/2014   Procedure: RIGHT LUMBAR FOUR-FIVE, RIGHT LUMBAR FIVE- SACRAL ONE LUMBAR LAMINECTOMY/DECOMPRESSION MICRODISCECTOMY ;  Surgeon: Ashok Pall, MD;  Location: [place] [facility];  Service: Neurosurgery;  Laterality: Right;  Right L4-5 and Right L5-S1 diskectomies  . PERIPHERAL VASCULAR BALLOON ANGIOPLASTY Left 10/31/2016   Procedure: Peripheral Vascular Balloon Angioplasty;  Surgeon: Serafina Mitchell, MD;  Location: [place] [facility];  Service: Cardiovascular;  Laterality: Left;  AV fistula  . PERIPHERAL VASCULAR BALLOON ANGIOPLASTY Left 01/28/2019   Procedure: PERIPHERAL VASCULAR BALLOON ANGIOPLASTY;  Surgeon: Serafina Mitchell, MD;  Location: [place] [facility];  Service: Cardiovascular;  Laterality: Left;  arm fistula  . TEE WITHOUT CARDIOVERSION N/A 05/02/2016   Procedure: TRANSESOPHAGEAL ECHOCARDIOGRAM (TEE);  Surgeon: Herminio Commons, MD;  [facility];  Service: Cardiovascular;  Laterality: N/A;  . TONSILLECTOMY      Family History  Adopted: Yes  Problem Relation Age of Onset  . Prostate cancer Father   . Cancer Father        prostate  . Heart failure Maternal Grandmother   . Heart attack Maternal Grandfather   . Colon cancer Maternal Grandfather     Social History   Socioeconomic History  . Marital status: Married    Spouse name: Not on file  . Number of children: 2  . Years of education: HS  . Highest education level: Not on file  Occupational History  . Not on file  Tobacco  Use  . Smoking status: Current Every Day Smoker    Packs/day: 0.50    Years: 40.00    Pack years: 20.00    Types: Cigarettes    Start date: 10/17/1973    Last attempt to quit: 02/07/2015    Years since quitting: 5.1  . Smokeless tobacco: Never Used  Vaping Use  . Vaping Use: Never used  Substance and Sexual Activity  . Alcohol use: No    Alcohol/week: 0.0 standard drinks  . Drug use: No  . Sexual activity: Not on file  Other Topics Concern  . Not on file  Social History Narrative   Drinks 3 Diet sodas a day    Social Determinants of Health   Financial  Resource Strain:   . Difficulty of Paying Living Expenses: Not on file  Food Insecurity:   . Worried About Charity fundraiser in the Last Year: Not on file  . Ran Out of Food in the Last Year: Not on file  Transportation Needs:   . Lack of Transportation (Medical): Not on file  . Lack of Transportation (Non-Medical): Not on file  Physical Activity:   . Days of Exercise per Week: Not on file  . Minutes of Exercise per Session: Not on file  Stress:   . Feeling of Stress : Not on file  Social Connections:   . Frequency of Communication with Friends and Family: Not on file  . Frequency of Social Gatherings with Friends and Family: Not on file  . Attends Religious Services: Not on file  . Active Member of Clubs or Organizations: Not on file  . Attends Archivist Meetings: Not on file  . Marital Status: Not on file  Intimate Partner Violence:   . Fear of Current or Ex-Partner: Not on file  . Emotionally Abused: Not on file  . Physically Abused: Not on file  . Sexually Abused: Not on file     Physical Exam   Vitals:   04/12/20 1134  BP: 119/63  Pulse: 90  Resp: 18  Temp: 97.8 F (36.6 C)  SpO2: 92%    CONSTITUTIONAL: Chronically ill-appearing, NAD NEURO:  Alert and oriented x 3, no focal deficits EYES:  eyes equal and reactive ENT/NECK:  no LAD, no JVD CARDIO: Regular rate, well-perfused, normal S1 and  S2 PULM:  CTAB no wheezing or rhonchi GI/GU:  normal bowel sounds, non-distended, non-tender MSK/SPINE:  No gross deformities, no edema, reduced range of motion of the left hip due to pain SKIN: Edema and chronic skin changes to the bilateral legs PSYCH:  Appropriate speech and behavior  *Additional and/or pertinent findings included in MDM below  Diagnostic and Interventional Summary    EKG Interpretation  Date/Time:  Monday April 12 2020 14:16:57 EST Ventricular Rate:  88 PR Interval:  182 QRS Duration: 120 QT Interval:  412 QTC Calculation: 498 R Axis:   -171 Text Interpretation: Unusual P axis, possible ectopic atrial rhythm Right superior axis deviation Inferior infarct , age undetermined Abnormal ECG Confirmed by Gerlene Fee [phone]) on 04/12/2020 2:59:04 PM      Labs Reviewed  CBC - Abnormal; Notable for the following components:      Result Value   RBC 3.27 (*)    Hemoglobin 9.1 (*)    HCT 30.4 (*)    MCHC 29.9 (*)    RDW 16.4 (*)    All other components within normal limits  COMPREHENSIVE METABOLIC PANEL - Abnormal; Notable for the following components:   Sodium 129 (*)    Chloride 93 (*)    Glucose, Bld 117 (*)    BUN 31 (*)    Creatinine, Ser 4.28 (*)    Calcium 8.5 (*)    Albumin 2.3 (*)    GFR, Estimated 15 (*)    All other components within normal limits  LIPASE, BLOOD  URINALYSIS, ROUTINE W REFLEX MICROSCOPIC  TROPONIN I (HIGH SENSITIVITY)    DG Chest Port 1 View    (Results Pending)  CT ABDOMEN PELVIS W CONTRAST    (Results Pending)    Medications  fentaNYL (SUBLIMAZE) injection 50 mcg (50 mcg Intravenous Given 04/12/20 1351)  iohexol (OMNIPAQUE) 300 MG/ML solution 100 mL (100 mLs Intravenous Contrast Given 04/12/20  1446)     Procedures  /  Critical Care Procedures  ED Course and Medical Decision Making  I have reviewed the triage vital signs, the nursing notes, and pertinent available records from the EMR.  Listed above are laboratory and  imaging tests that I personally ordered, reviewed, and interpreted and then considered in my medical decision making (see below for details).  Diffuse left-sided pain of unclear etiology, favoring musculoskeletal, also considering kidney stone, pyelonephritis, diverticulitis, hip or pelvic fracture given recent fall.  Will evaluate with CT imaging.  No fever, no leukocytosis, no erythema, no increased warmth, nothing to suggest septic joint of the hip.     Awaiting x-ray of chest and CT imaging of the abdomen, if work-up is reassuring will attempt ambulation and discharge.  Signed out to oncoming provider at shift change.  Barth Kirks. Sedonia Small, [place] [email or web address]  Final Clinical Impressions(s) / ED Diagnoses     ICD-10-CM   1. Flank pain  R10.9     ED Discharge Orders    None       Discharge Instructions Discussed with and Provided to Patient:   Discharge Instructions   None        Maudie Flakes, MD 04/12/20 1515

## 2020-04-12 NOTE — ED Provider Notes (Addendum)
3:30 PM-checkout from Dr. Sedonia Small to evaluate for improvement following medication to treat left side pain following a fall.  Patient is due to be dialyzed today but did not go.  His fall was several days ago.  Imaging reported to be negative for acute injury.  4:43 PM-at this time the patient states that he is feeling better, after the fentanyl, and is ready to go home.  4:46 PM-initial troponin has resulted, elevated.  Similar level of troponin, when he was evaluated for SOB, 12/29/2019. He was treated for PNE as outpatient At that time a troponin did not change, but was subsequently lower, on 01/09/2020.  8:10 PM-laboratory evaluation complete.  Delta troponin required because of initial elevated troponin.  Patient has not had any chest pain.  Delta troponin, obtain late, did not show significant elevation.  He has previously had elevations at this level when he needed dialysis.  Patient missed his dialysis today.  I doubt that this represents an acute MI.  BNP elevated, not typically helpful in the scenario the patient presents with.  He typically has very high BNP levels.  Remainder of laboratory testing is consistent with end-stage renal disease, chronic abnormalities.  Vital signs reassuring.  Patient stable for discharge with symptomatic care for subacute injury fall, injuring flank.  Patient asserts that he will get dialysis tomorrow.  He has chronic pain and uses oral dissolving buprenorphine film, twice a day.    Daleen Bo, MD 04/12/20 2016  10:50 PM-after initial discharge, the patient asked to speak to me.  He states he is having "pain in my hip and leg."  He points to his left lower back as the area where he is having pain.  He also has pain in the left hip region.  He states he has had similar pain to this for 6 years, and then it began again 6 days ago.  He fell the day after that because of the pain in his left lower back and left leg.  He states he takes chronic pain because of low back  pain.  I explained to him that this was a chronic problem but he may have gotten some radiculopathy from his chronic back disorder.  He is agreeable to taking prednisone, and understands that it may raise his blood sugar some.  He will follow up with his back surgeon, or PCP for ongoing management.    Daleen Bo, MD 04/12/20 [phone]

## 2020-04-12 NOTE — ED Notes (Signed)
Ambulated patient in hallway. O2 sat maintained at 95%.

## 2020-04-12 NOTE — Discharge Instructions (Addendum)
Take your usual medications.  Make sure you get dialyzed tomorrow since yesterday.  Follow-up with your primary care doctor for checkup in a week or so.  Return here if needed for problems including trouble breathing, chest pain, weakness or dizziness.  The pain in your left lower back, left hip and left leg is likely from a nerve disorder associated with your chronic back pain.  To help this we are prescribing prednisone.  Follow-up with your back surgeon, Dr. Patrice Paradise, or your PCP for further management and treatment of this disorder.

## 2020-04-12 NOTE — ED Notes (Signed)
Date and time results received: 04/12/20 1649 (use smartphrase ".now" to insert current time)  Test: Troponin Critical Value: 236  Name of Provider Notified: Dr Eulis Foster Orders Received? Or Actions Taken?:NA

## 2020-04-12 NOTE — ED Triage Notes (Signed)
Pt brought in via [facility]. Pt complains of hip pain since last Monday that has progressively gotten worse. He states he fell two days ago due to the hip pain. States he has had similar pain in the past, however, this has progressively gotten worse and he can not bare any weight on the leg.

## 2020-04-17 ENCOUNTER — Emergency Department (HOSPITAL_COMMUNITY): Payer: Medicare Other

## 2020-04-17 ENCOUNTER — Other Ambulatory Visit: Payer: Self-pay

## 2020-04-17 ENCOUNTER — Emergency Department (HOSPITAL_COMMUNITY)
Admission: EM | Admit: 2020-04-17 | Discharge: 2020-04-17 | Disposition: A | Discharge status: Home / Self Care | Payer: Medicare Other | Attending: Emergency Medicine | Admitting: Emergency Medicine

## 2020-04-17 ENCOUNTER — Encounter (HOSPITAL_COMMUNITY): Payer: Self-pay

## 2020-04-17 DIAGNOSIS — Z79899 Other long term (current) drug therapy: Secondary | ICD-10-CM | POA: Insufficient documentation

## 2020-04-17 DIAGNOSIS — Z992 Dependence on renal dialysis: Secondary | ICD-10-CM | POA: Insufficient documentation

## 2020-04-17 DIAGNOSIS — E119 Type 2 diabetes mellitus without complications: Secondary | ICD-10-CM | POA: Insufficient documentation

## 2020-04-17 DIAGNOSIS — N186 End stage renal disease: Secondary | ICD-10-CM | POA: Insufficient documentation

## 2020-04-17 DIAGNOSIS — Z7982 Long term (current) use of aspirin: Secondary | ICD-10-CM | POA: Diagnosis not present

## 2020-04-17 DIAGNOSIS — I132 Hypertensive heart and chronic kidney disease with heart failure and with stage 5 chronic kidney disease, or end stage renal disease: Secondary | ICD-10-CM | POA: Insufficient documentation

## 2020-04-17 DIAGNOSIS — Z85828 Personal history of other malignant neoplasm of skin: Secondary | ICD-10-CM | POA: Diagnosis not present

## 2020-04-17 DIAGNOSIS — I2510 Atherosclerotic heart disease of native coronary artery without angina pectoris: Secondary | ICD-10-CM | POA: Diagnosis not present

## 2020-04-17 DIAGNOSIS — Z955 Presence of coronary angioplasty implant and graft: Secondary | ICD-10-CM | POA: Insufficient documentation

## 2020-04-17 DIAGNOSIS — I5023 Acute on chronic systolic (congestive) heart failure: Secondary | ICD-10-CM | POA: Insufficient documentation

## 2020-04-17 DIAGNOSIS — Z20822 Contact with and (suspected) exposure to covid-19: Secondary | ICD-10-CM | POA: Insufficient documentation

## 2020-04-17 DIAGNOSIS — F17210 Nicotine dependence, cigarettes, uncomplicated: Secondary | ICD-10-CM | POA: Diagnosis not present

## 2020-04-17 LAB — RESP PANEL BY RT PCR (RSV, FLU A&B, COVID)
Influenza A by PCR: NEGATIVE
Influenza B by PCR: NEGATIVE
Respiratory Syncytial Virus by PCR: NEGATIVE
SARS Coronavirus 2 by RT PCR: NEGATIVE

## 2020-04-17 LAB — CBC WITH DIFFERENTIAL/PLATELET
Abs Immature Granulocytes: 0.11 10*3/uL — ABNORMAL HIGH (ref 0.00–0.07)
Basophils Absolute: 0 10*3/uL (ref 0.0–0.1)
Basophils Relative: 0 %
Eosinophils Absolute: 0 10*3/uL (ref 0.0–0.5)
Eosinophils Relative: 0 %
HCT: 30.2 % — ABNORMAL LOW (ref 39.0–52.0)
Hemoglobin: 8.8 g/dL — ABNORMAL LOW (ref 13.0–17.0)
Immature Granulocytes: 1 %
Lymphocytes Relative: 1 %
Lymphs Abs: 0.2 10*3/uL — ABNORMAL LOW (ref 0.7–4.0)
MCH: 27.7 pg (ref 26.0–34.0)
MCHC: 29.1 g/dL — ABNORMAL LOW (ref 30.0–36.0)
MCV: 95 fL (ref 80.0–100.0)
Monocytes Absolute: 0.4 10*3/uL (ref 0.1–1.0)
Monocytes Relative: 2 %
Neutro Abs: 16 10*3/uL — ABNORMAL HIGH (ref 1.7–7.7)
Neutrophils Relative %: 96 %
Platelets: 309 10*3/uL (ref 150–400)
RBC: 3.18 MIL/uL — ABNORMAL LOW (ref 4.22–5.81)
RDW: 16.7 % — ABNORMAL HIGH (ref 11.5–15.5)
WBC: 16.7 10*3/uL — ABNORMAL HIGH (ref 4.0–10.5)
nRBC: 0 % (ref 0.0–0.2)

## 2020-04-17 LAB — COMPREHENSIVE METABOLIC PANEL
ALT: 22 U/L (ref 0–44)
AST: 17 U/L (ref 15–41)
Albumin: 2.4 g/dL — ABNORMAL LOW (ref 3.5–5.0)
Alkaline Phosphatase: 93 U/L (ref 38–126)
Anion gap: 10 (ref 5–15)
BUN: 43 mg/dL — ABNORMAL HIGH (ref 8–23)
CO2: 28 mmol/L (ref 22–32)
Calcium: 8.5 mg/dL — ABNORMAL LOW (ref 8.9–10.3)
Chloride: 96 mmol/L — ABNORMAL LOW (ref 98–111)
Creatinine, Ser: 4.23 mg/dL — ABNORMAL HIGH (ref 0.61–1.24)
GFR, Estimated: 15 mL/min — ABNORMAL LOW (ref >60)
Glucose, Bld: 240 mg/dL — ABNORMAL HIGH (ref 70–99)
Potassium: 5 mmol/L (ref 3.5–5.1)
Sodium: 134 mmol/L — ABNORMAL LOW (ref 135–145)
Total Bilirubin: 0.5 mg/dL (ref 0.3–1.2)
Total Protein: 6.6 g/dL (ref 6.5–8.1)

## 2020-04-17 LAB — MAGNESIUM: Magnesium: 1.7 mg/dL (ref 1.7–2.4)

## 2020-04-17 MED ORDER — CHLORHEXIDINE GLUCONATE CLOTH 2 % EX PADS: 6.0000 | MEDICATED_PAD | Freq: Every day | CUTANEOUS | Status: DC

## 2020-04-17 MED ORDER — LIDOCAINE-PRILOCAINE 2.5-2.5 % EX CREA: 1.0000 "application " | TOPICAL_CREAM | CUTANEOUS | Status: DC | PRN

## 2020-04-17 MED ORDER — LIDOCAINE HCL (PF) 1 % IJ SOLN: 5.0000 mL | INTRAMUSCULAR | Status: DC | PRN

## 2020-04-17 MED ORDER — DOXYCYCLINE HYCLATE 100 MG PO CAPS: 100.0000 mg | ORAL_CAPSULE | Freq: Two times a day (BID) | ORAL | 0 refills | Status: DC

## 2020-04-17 MED ORDER — SODIUM CHLORIDE 0.9 % IV SOLN: 100.0000 mL | INTRAVENOUS | Status: DC | PRN

## 2020-04-17 MED ORDER — SODIUM ZIRCONIUM CYCLOSILICATE 5 G PO PACK: 10.0000 g | PACK | Freq: Every day | ORAL | Status: DC

## 2020-04-17 MED ORDER — PENTAFLUOROPROP-TETRAFLUOROETH EX AERO: 1.0000 "application " | INHALATION_SPRAY | CUTANEOUS | Status: DC | PRN

## 2020-04-17 MED ORDER — ACETAMINOPHEN 500 MG PO TABS: 500.0000 mg | ORAL_TABLET | Freq: Once | ORAL | Status: DC

## 2020-04-17 NOTE — ED Triage Notes (Signed)
Pt brought to ED via [facility]. Pt went to dialysis but dialysis would not do dialysis bc pt could not stand and pivot. Pt is suppose to go to assisted living on Monday, arranged by family physician but needs covid and TB test.

## 2020-04-17 NOTE — Discharge Instructions (Signed)
Seen here for dialysis.  X-ray shows possible infection.   started you on antibiotics please take as prescribed.  Please continue with your home medications, and go to your scheduled hemodialysis.  I like you to follow-up with your PCP in 1 week's time for further evaluation management.  Come back to the emergency department if you develop chest pain, shortness of breath, severe abdominal pain, uncontrolled nausea, vomiting, diarrhea.

## 2020-04-17 NOTE — ED Notes (Signed)
Pt remains with staff for dialysis at this time.

## 2020-04-17 NOTE — Procedures (Signed)
° °  HEMODIALYSIS TREATMENT NOTE:  4 hour heparin-free HD completed via LUE AVF (15g/antegrade). Goal met: 5 liters removed without interruption in UF. Hemodynamically stable throughout session.  All blood was returned and hemostasis was achieved in 30 minutes.  No changes from pre-dialysis assessment.  Rockwell Alexandria, RN

## 2020-04-17 NOTE — ED Notes (Signed)
Staff at bedside for dialysis at this time.

## 2020-04-17 NOTE — Consult Note (Signed)
[facility]  INPATIENT CONSULTATION  Reason for Consultation: ESRD  Requesting Provider: Dr. Langston Masker  HPI: Mark [name] is an 64 y.o. male with ESRD on HD MWF since 2016, chronic back issues, tobacco use, HTN, Anemia, HL, DM who is seen for evaluation and management of ESRD and assoc conditions.   Pt typically does HD [facility] MWF but hurt back Thurs PM and couldn't go to HD Friday.  Plan was to go to HD today but wasn't able to transport and sit in chair for HD so EMS brought him here.  He reports plan is to have HD here today, go home and hopefully recover enough to go to outpt HD Mon.  His PCP has already set up from him to go into [facility] for back rehab he report.   He says typically UF 4-5L.  Edema is chronic.  LUE AVF no issues; LUE edema since AVF placed, not different recently.  No orthopnea.   PMH: Past Medical History:  Diagnosis Date  . Anxiety    occas. panic attack, takes xanax occas  . Arthritis    herniated disc, lumbar   . Back pain   . CAD (coronary artery disease)    03/25/2015 95% prox to mid LAD treated with Promus Premier DES 3.5x32mm postdilated to 4.11mm, 100% prox to distal RCA lesion filled in the PDA region via collaterals from LAD and PLA  . Cancer ([place])    - skin ca on face- removed   . CHF (congestive heart failure) ([place])   . Chronic kidney disease   . Complication of anesthesia    " it takes a long time to get over it."   . Diabetes mellitus without complication ([place])    type 2  . Dialysis patient ([place])   . Fibromyalgia   . GERD (gastroesophageal reflux disease)    otc- pepcid , approx. every other  day    . H/O acute respiratory failure   . Hematuria    being followed by Dr. Hinda Lenis for decreased kidney function   . History of blood product transfusion 02/13/2014   After having back surgery  . Hypercholesteremia   . Hypertension   . Myocardial infarction ([place]) 03/2015  . Pneumonia    "walking"  . Shortness of breath  dyspnea   . Sleep apnea    test aborted, due to not able to relax , since the aborted test he had surgery for gallbladder & he reports that he was told that he has sleep apnea    PSH: Past Surgical History:  Procedure Laterality Date  . A/V FISTULAGRAM Left 10/31/2016   Procedure: A/V Fistulagram;  Surgeon: Serafina Mitchell, MD;  Location: [place] [facility];  Service: Cardiovascular;  Laterality: Left;  . AV FISTULA PLACEMENT Left 02/02/2015   Procedure: LEFT ARM RADIOCEPHALIC ARTERIOVENOUS (AV) FISTULA CREATION;  Surgeon: Conrad Woodruff, MD;  Location: [place];  Service: Vascular;  Laterality: Left;  . AV FISTULA PLACEMENT Left 05/25/2016   Procedure: ARTERIOVENOUS (AV) FISTULA CREATION;  Surgeon: [name], MD;  [facility];  Service: Vascular;  Laterality: Left;  . BACK SURGERY  02/13/14  . BIOPSY  03/08/2018   Procedure: BIOPSY;  Surgeon: Rogene Houston, MD;  [facility];  Service: Endoscopy;;  gastric  . CARDIAC CATHETERIZATION N/A 03/25/2015   Procedure: Left Heart Cath and Coronary Angiography;  Surgeon: Leonie Man, MD;  Location: [place] [facility];  Service: Cardiovascular;  Laterality:  N/A;  . CARDIAC CATHETERIZATION N/A 03/25/2015   Procedure: Coronary Stent Intervention;  Surgeon: Leonie Man, MD;  Location: [place] [facility];  Service: Cardiovascular;  Laterality: N/A;  . CHOLECYSTECTOMY    . COLONOSCOPY    . COLONOSCOPY WITH PROPOFOL N/A 03/08/2018   Procedure: COLONOSCOPY WITH PROPOFOL;  Surgeon: Rogene Houston, MD;  [facility];  Service: Endoscopy;  Laterality: N/A;  11:10  . EYE SURGERY     cataracts remove, bilateral, w/IOL.  Marland Kitchen INSERTION OF DIALYSIS CATHETER N/A 03/09/2015   Procedure: INSERTION OF DIALYSIS CATHETER;  Surgeon: Conrad W[place]osa, MD;  Location: [place];  Service: Vascular;  Laterality: N/A;  . INSERTION OF DIALYSIS CATHETER Left 04/07/2016   Procedure: INSERTION OF TUNNELED DIALYSIS CATHETER LEFT INTERNAL JUGULAR,  ATTEMPTED INSERTION OF TUNNELLED CATHETER RIGHT INTERNAL JUGULAR;  Surgeon: [name], MD;  [facility];  Service: Vascular;  Laterality: Left;  . IR GENERIC HISTORICAL  07/28/2016   IR FLUORO GUIDE CV LINE LEFT 07/28/2016 Greggory Keen, MD [facility]  . IR REMOVAL TUN CV CATH W/O FL  03/28/2017  . LUMBAR LAMINECTOMY/DECOMPRESSION MICRODISCECTOMY Right 02/13/2014   Procedure: RIGHT LUMBAR FOUR-FIVE, RIGHT LUMBAR FIVE- SACRAL ONE LUMBAR LAMINECTOMY/DECOMPRESSION MICRODISCECTOMY ;  Surgeon: Ashok Pall, MD;  Location: [place] [facility];  Service: Neurosurgery;  Laterality: Right;  Right L4-5 and Right L5-S1 diskectomies  . PERIPHERAL VASCULAR BALLOON ANGIOPLASTY Left 10/31/2016   Procedure: Peripheral Vascular Balloon Angioplasty;  Surgeon: Serafina Mitchell, MD;  Location: [place] [facility];  Service: Cardiovascular;  Laterality: Left;  AV fistula  . PERIPHERAL VASCULAR BALLOON ANGIOPLASTY Left 01/28/2019   Procedure: PERIPHERAL VASCULAR BALLOON ANGIOPLASTY;  Surgeon: Serafina Mitchell, MD;  Location: [place] [facility];  Service: Cardiovascular;  Laterality: Left;  arm fistula  . TEE WITHOUT CARDIOVERSION N/A 05/02/2016   Procedure: TRANSESOPHAGEAL ECHOCARDIOGRAM (TEE);  Surgeon: Herminio Commons, MD;  [facility];  Service: Cardiovascular;  Laterality: N/A;  . TONSILLECTOMY      DIALYSIS:  Past Medical History:  Diagnosis Date  . Anxiety    occas. panic attack, takes xanax occas  . Arthritis    herniated disc, lumbar   . Back pain   . CAD (coronary artery disease)    03/25/2015 95% prox to mid LAD treated with Promus Premier DES 3.5x60mm postdilated to 4.55mm, 100% prox to distal RCA lesion filled in the PDA region via collaterals from LAD and PLA  . Cancer ([place])    - skin ca on face- removed   . CHF (congestive heart failure) ([place])   . Chronic kidney disease   . Complication of anesthesia    " it takes a long time to get over it."   . Diabetes mellitus without  complication ([place])    type 2  . Dialysis patient ([place])   . Fibromyalgia   . GERD (gastroesophageal reflux disease)    otc- pepcid , approx. every other  day    . H/O acute respiratory failure   . Hematuria    being followed by Dr. Hinda Lenis for decreased kidney function   . History of blood product transfusion 02/13/2014   After having back surgery  . Hypercholesteremia   . Hypertension   . Myocardial infarction ([place]) 03/2015  . Pneumonia    "walking"  . Shortness of breath dyspnea   . Sleep apnea    test aborted, due to not able to relax , since the aborted test he had surgery  for gallbladder & he reports that he was told that he has sleep apnea     Medications:  I have reviewed the patient's current medications.  (Not in a hospital admission)   ALLERGIES:   Allergies  Allergen Reactions  . Morphine And Related     Upset stomach   . Varenicline Other (See Comments)    Causes vivid dreams    FAM HX: Family History  Adopted: Yes  Problem Relation Age of Onset  . Prostate cancer Father   . Cancer Father        prostate  . Heart failure Maternal Grandmother   . Heart attack Maternal Grandfather   . Colon cancer Maternal Grandfather     Social History:   reports that he has been smoking cigarettes. He started smoking about 46 years ago. He has a 20.00 pack-year smoking history. He has never used smokeless tobacco. He reports that he does not drink alcohol and does not use drugs.  ROS: 12 system ROS neg except per HPI  Blood pressure (!) 146/81, pulse 98, temperature 97.9 F (36.6 C), temperature source Oral, resp. rate 20, height 6' (1.829 m), weight 102.1 kg, SpO2 92 %. PHYSICAL EXAM: Gen: chronically ill. nontoxic  Eyes:  anicteric ENT: MMM Neck: supple CV:  RRR, no rub Abd:  Soft, obese Lungs: rales to halfway up fields GU: no foley Extr:  Chronic woody edema, 2+ pitting to knees Neuro: conversant Skin: some ruddy erythema over tibias, no warmth or  drainage   No results found for this or any previous visit (from the past 28 hour(s)).  No results found.  Assessment/Plan **Back and L leg pain:  Limiting mobility and ability to dialyze so in ED.  Plans to go into SNF Monday.Per EDP.   **ESRD on HD: HD today with UF 4-5L as tolerated.  Sounds wet on exam and 11/8 CXR here noted pulmonary edema.  Needs to limit po fluid intake discussed.   His plan is he will be better enough by Mon to attend outpt HD.   **HTN: BP reasonable  **Anemia:  Hb 9.1 on 11/8.  Check pending here today. Don't anticipate any acute needs.   Justin Mend 04/17/2020, 2:22 PM

## 2020-04-17 NOTE — ED Notes (Signed)
I assumed care of this patient at this time. At the time of assuming care, patient appears to be resting in bed with no obvious signs of distress. Bed is locked in the lowest position, side rails x2, call bell within reach. Respirations are even and unlabored with equal chest rise and fall. All questions and concerns voiced addressed at this time. Will continue to monitor.

## 2020-04-17 NOTE — ED Notes (Signed)
Repositioned patient in bed.

## 2020-04-17 NOTE — ED Provider Notes (Signed)
[facility] EMERGENCY DEPARTMENT Provider Note   CSN: [id number] Arrival date & time: 04/17/20  1245     History Chief Complaint  Patient presents with  . Needs Dialysis    Mark [name] is a 64 y.o. male.  HPI    Patient with similar medical history of CHF, end-stage renal disease on dialysis Monday Wednesday Friday, sleep apnea, diabetes, hypertension presents emerged department chief complaint of missed dialysis.  Patient states he was supposed to get his dialysis yesterday but was unable to because his left hip was hurting him and was unable to get out of bed.  He rescheduled to go to dialysis today but again was unable to get out of bed due to pain in his left hip.  Dialysis instructed him that he had to come here to be dialysis reevaluated.  He endorses that his left leg started to hurt him Friday morning.  He endorses that he slept on his left side Thursday night and woke up the following morning with pain.  He believes that is what is causing him his pain.  He denies increased leg swelling, worsening orthopnea, chest pain, shortness of breath, fevers, chills, states he is tolerating p.o. without difficulties, denies any nausea vomiting diarrhea.  He believes this pain on his left hip is mainly because from sleeping on his left side.  He endorses that he has had chronic bilateral leg pain, has had 3 back surgeries and is always having issues with his legs.  He denies any other symptoms at this time.  He also endorses that his primary care doctor has scheduled him to go to rehab starting Monday and needs a Covid test and TB done before he can go.  Patient denies headaches, fevers, chills, shortness of breath, chest pain, abdominal pain, nausea, vomiting, diarrhea, pedal edema.  Past Medical History:  Diagnosis Date  . Anxiety    occas. panic attack, takes xanax occas  . Arthritis    herniated disc, lumbar   . Back pain   . CAD (coronary artery disease)    03/25/2015 95% prox to  mid LAD treated with Promus Premier DES 3.5x61mm postdilated to 4.12mm, 100% prox to distal RCA lesion filled in the PDA region via collaterals from LAD and PLA  . Cancer ([place])    - skin ca on face- removed   . CHF (congestive heart failure) ([place])   . Chronic kidney disease   . Complication of anesthesia    " it takes a long time to get over it."   . Diabetes mellitus without complication ([place])    type 2  . Dialysis patient ([place])   . Fibromyalgia   . GERD (gastroesophageal reflux disease)    otc- pepcid , approx. every other  day    . H/O acute respiratory failure   . Hematuria    being followed by Dr. Hinda Lenis for decreased kidney function   . History of blood product transfusion 02/13/2014   After having back surgery  . Hypercholesteremia   . Hypertension   . Myocardial infarction ([place]) 03/2015  . Pneumonia    "walking"  . Shortness of breath dyspnea   . Sleep apnea    test aborted, due to not able to relax , since the aborted test he had surgery for gallbladder & he reports that he was told that he has sleep apnea     Patient Active Problem List   Diagnosis Date Noted  . Acute on chronic systolic CHF (congestive  heart failure) ([place]) 01/10/2020  . Volume overload 01/10/2020  . Hypoxia 01/09/2020  . Positive colorectal cancer screening using Cologuard test 02/11/2018  . Ischemic cardiomyopathy   . Bacteremia   . Anxiety state 04/06/2016  . Diabetes mellitus type 2 in obese ([place]) 04/06/2016  . Bacteremia associated with intravascular line ([place]) 04/04/2016  . Abnormal stress test 03/25/2015  . Abnormal nuclear stress test   . Cardiomyopathy ([place])   . Chronic systolic heart failure ([place])   . ESRD (end stage renal disease) on dialysis ([place])   . Pre-operative cardiovascular examination   . Positive cardiac stress test 03/24/2015  . Essential hypertension   . Acute respiratory failure ([place]) 02/12/2015  . Renal failure   . Hyperkalemia   . Chronic kidney disease (CKD), stage  IV (severe) ([place]) 01/22/2015  . HNP (herniated nucleus pulposus), lumbar 02/13/2014  . Abnormal ECG 02/12/2014  . HTN (hypertension) 02/12/2014  . Bradycardia 02/12/2014    Past Surgical History:  Procedure Laterality Date  . A/V FISTULAGRAM Left 10/31/2016   Procedure: A/V Fistulagram;  Surgeon: Serafina Mitchell, MD;  Location: [place] [facility];  Service: Cardiovascular;  Laterality: Left;  . AV FISTULA PLACEMENT Left 02/02/2015   Procedure: LEFT ARM RADIOCEPHALIC ARTERIOVENOUS (AV) FISTULA CREATION;  Surgeon: Conrad [place], MD;  Location: [place];  Service: Vascular;  Laterality: Left;  . AV FISTULA PLACEMENT Left 05/25/2016   Procedure: ARTERIOVENOUS (AV) FISTULA CREATION;  Surgeon: [name], MD;  Location: [facility];  Service: Vascular;  Laterality: Left;  . BACK SURGERY  02/13/14  . BIOPSY  03/08/2018   Procedure: BIOPSY;  Surgeon: Rogene Houston, MD;  [facility];  Service: Endoscopy;;  gastric  . CARDIAC CATHETERIZATION N/A 03/25/2015   Procedure: Left Heart Cath and Coronary Angiography;  Surgeon: Leonie Man, MD;  Location: [place] [facility];  Service: Cardiovascular;  Laterality: N/A;  . CARDIAC CATHETERIZATION N/A 03/25/2015   Procedure: Coronary Stent Intervention;  Surgeon: Leonie Man, MD;  Location: [place] [facility];  Service: Cardiovascular;  Laterality: N/A;  . CHOLECYSTECTOMY    . COLONOSCOPY    . COLONOSCOPY WITH PROPOFOL N/A 03/08/2018   Procedure: COLONOSCOPY WITH PROPOFOL;  Surgeon: Rogene Houston, MD;  [facility];  Service: Endoscopy;  Laterality: N/A;  11:10  . EYE SURGERY     cataracts remove, bilateral, w/IOL.  Marland Kitchen INSERTION OF DIALYSIS CATHETER N/A 03/09/2015   Procedure: INSERTION OF DIALYSIS CATHETER;  Surgeon: Conrad [place], MD;  Location: [place];  Service: Vascular;  Laterality: N/A;  . INSERTION OF DIALYSIS CATHETER Left 04/07/2016   Procedure: INSERTION OF TUNNELED DIALYSIS CATHETER LEFT INTERNAL JUGULAR, ATTEMPTED  INSERTION OF TUNNELLED CATHETER RIGHT INTERNAL JUGULAR;  Surgeon: [name], MD;  Location: [facility];  Service: Vascular;  Laterality: Left;  . IR GENERIC HISTORICAL  07/28/2016   IR FLUORO GUIDE CV LINE LEFT 07/28/2016 Greggory Keen, MD [facility]-INTER[facility]  . IR REMOVAL TUN CV CATH W/O FL  03/28/2017  . LUMBAR LAMINECTOMY/DECOMPRESSION MICRODISCECTOMY Right 02/13/2014   Procedure: RIGHT LUMBAR FOUR-FIVE, RIGHT LUMBAR FIVE- SACRAL ONE LUMBAR LAMINECTOMY/DECOMPRESSION MICRODISCECTOMY ;  Surgeon: Ashok Pall, MD;  Location: [place] [facility];  Service: Neurosurgery;  Laterality: Right;  Right L4-5 and Right L5-S1 diskectomies  . PERIPHERAL VASCULAR BALLOON ANGIOPLASTY Left 10/31/2016   Procedure: Peripheral Vascular Balloon Angioplasty;  Surgeon: Serafina Mitchell, MD;  Location: [place] [facility];  Service: Cardiovascular;  Laterality: Left;  AV fistula  .  PERIPHERAL VASCULAR BALLOON ANGIOPLASTY Left 01/28/2019   Procedure: PERIPHERAL VASCULAR BALLOON ANGIOPLASTY;  Surgeon: Serafina Mitchell, MD;  Location: [place] [facility];  Service: Cardiovascular;  Laterality: Left;  arm fistula  . TEE WITHOUT CARDIOVERSION N/A 05/02/2016   Procedure: TRANSESOPHAGEAL ECHOCARDIOGRAM (TEE);  Surgeon: Herminio Commons, MD;  [facility];  Service: Cardiovascular;  Laterality: N/A;  . TONSILLECTOMY         Family History  Adopted: Yes  Problem Relation Age of Onset  . Prostate cancer Father   . Cancer Father        prostate  . Heart failure Maternal Grandmother   . Heart attack Maternal Grandfather   . Colon cancer Maternal Grandfather     Social History   Tobacco Use  . Smoking status: Current Every Day Smoker    Packs/day: 0.50    Years: 40.00    Pack years: 20.00    Types: Cigarettes    Start date: 10/17/1973    Last attempt to quit: 02/07/2015    Years since quitting: 5.1  . Smokeless tobacco: Never Used  Vaping Use  . Vaping Use: Never used  Substance Use Topics  . Alcohol use: No     Alcohol/week: 0.0 standard drinks  . Drug use: No    Home Medications Prior to Admission medications   Medication Sig Start Date End Date Taking? Authorizing Provider  albuterol (PROAIR HFA) 108 (90 Base) MCG/ACT inhaler Inhale 2 puffs into the lungs every 4 (four) hours as needed. 12/05/16   [provider]  amoxicillin (AMOXIL) 875 MG tablet Take 875 mg by mouth 2 (two) times daily. 01/13/20   [provider]  aspirin EC 81 MG tablet Take 1 tablet (81 mg total) by mouth daily. 03/09/18   Rehman, Mechele Dawley, MD  atorvastatin (LIPITOR) 40 MG tablet Take 1 tablet (40 mg total) by mouth daily. 07/15/19 01/15/20  Herminio Commons, MD  BELBUCA 900 MCG FILM Take 1 Film by mouth 2 (two) times daily. 01/02/20   [provider]  betamethasone dipropionate (DIPROLENE) 0.05 % cream Apply 1 application topically 2 (two) times daily as needed (irritation).    [provider]  carvedilol (COREG) 6.25 MG tablet Take 1 tablet by mouth in the morning and at bedtime. 10/13/19   [provider]  cloNIDine (CATAPRES) 0.1 MG tablet Take 0.1 mg by mouth daily.    [provider]  doxycycline (VIBRAMYCIN) 100 MG capsule Take 1 capsule (100 mg total) by mouth 2 (two) times daily for 7 days. 04/17/20 04/24/20  Marcello Fennel, PA-C  DULoxetine (CYMBALTA) 60 MG capsule Take 60 mg by mouth daily.     [provider]  hydrALAZINE (APRESOLINE) 25 MG tablet TAKE ONE TABLET BY MOUTH THREE TIMES DAILY 12/02/19   Verta Ellen., NP  levothyroxine (SYNTHROID) 100 MCG tablet Take 100 mcg by mouth daily. 01/12/20   [provider]  lidocaine-prilocaine (EMLA) cream Apply 1 application topically as needed (before dialysis).    [provider]  nitroGLYCERIN (NITROSTAT) 0.4 MG SL tablet Place 1 tablet (0.4 mg total) under the tongue every 5 (five) minutes x 3 doses as needed for chest pain (If no relief after 3rd dose, proceed to the ED for an  evaluation). I 04/12/18   Herminio Commons, MD  OXYGEN Inhale 3.5 L into the lungs continuous.     [provider]  polyethylene glycol (MIRALAX / GLYCOLAX) 17 g packet Take 17  g by mouth as needed.     [provider]  predniSONE (DELTASONE) 20 MG tablet Take 1 tablet (20 mg total) by mouth 2 (two) times daily. 04/12/20   Daleen Bo, MD  sucralfate (CARAFATE) 1 g tablet Take 1 g by mouth 3 (three) times daily.    [provider]  traMADol (ULTRAM) 50 MG tablet Take 50 mg by mouth 2 (two) times daily as needed. 09/06/19   [provider]    Allergies    Morphine and related and Varenicline  Review of Systems   Review of Systems  Constitutional: Negative for chills and fever.  HENT: Negative for congestion.   Respiratory: Negative for shortness of breath.   Cardiovascular: Negative for chest pain.  Gastrointestinal: Negative for abdominal pain, diarrhea, nausea and vomiting.  Genitourinary: Negative for enuresis.  Musculoskeletal: Negative for back pain.       Left leg weakness.  Skin: Negative for rash.  Neurological: Negative for dizziness.  Hematological: Does not bruise/bleed easily.    Physical Exam Updated Vital Signs BP (!) 149/75 (BP Location: Right Arm) Comment: Simultaneous filing. User may not have seen previous data.  Pulse 95 Comment: Simultaneous filing. User may not have seen previous data.  Temp 98 F (36.7 C) (Oral)   Resp 14 Comment: Simultaneous filing. User may not have seen previous data.  Ht 6' (1.829 m)   Wt 102 kg   SpO2 98%   BMI 30.50 kg/m   Physical Exam Vitals and nursing note reviewed.  Constitutional:      General: He is not in acute distress.    Appearance: Normal appearance. He is not ill-appearing or diaphoretic.  HENT:     Head: Normocephalic and atraumatic.     Nose: No congestion or rhinorrhea.  Eyes:     General: No visual field deficit.    Extraocular Movements: Extraocular movements intact.       Conjunctiva/sclera: Conjunctivae normal.     Pupils: Pupils are equal, round, and reactive to light.  Cardiovascular:     Rate and Rhythm: Normal rate and regular rhythm.     Pulses: Normal pulses.     Heart sounds: No murmur heard.  No friction rub. No gallop.   Pulmonary:     Effort: Pulmonary effort is normal. No respiratory distress.     Breath sounds: No wheezing, rhonchi or rales.  Abdominal:     General: There is no distension.     Palpations: Abdomen is soft.     Tenderness: There is no abdominal tenderness. There is no guarding.  Musculoskeletal:        General: No swelling or tenderness.     Right lower leg: Edema present.     Left lower leg: Edema present.     Comments: Patient is moving all 4 extremities out any difficulty.  Patient had noted 1+ edema bilaterally up to his shins.  Skin:    General: Skin is warm and dry.     Comments: Patient had noted chronic lymphedema changes bilaterally on his legs, no ulcers, lesions noted.  Were normal temperature and neurovascular is fully intact.  Neurological:     General: No focal deficit present.     Mental Status: He is alert.     GCS: GCS eye subscore is 4. GCS verbal subscore is 5. GCS motor subscore is 6.     Cranial Nerves: Cranial nerves are intact. No cranial nerve deficit or facial asymmetry.     Sensory:  Sensation is intact. No sensory deficit.     Motor: Motor function is intact. No weakness or pronator drift.     Coordination: Coordination is intact. Romberg sign negative. Finger-Nose-Finger Test and Heel to [facility] Test normal.  Psychiatric:        Mood and Affect: Mood normal.     ED Results / Procedures / Treatments   Labs (all labs ordered are listed, but only abnormal results are displayed) Labs Reviewed  COMPREHENSIVE METABOLIC PANEL - Abnormal; Notable for the following components:      Result Value   Sodium 134 (*)    Chloride 96 (*)    Glucose, Bld 240 (*)    BUN 43 (*)    Creatinine, Ser 4.23  (*)    Calcium 8.5 (*)    Albumin 2.4 (*)    GFR, Estimated 15 (*)    All other components within normal limits  CBC WITH DIFFERENTIAL/PLATELET - Abnormal; Notable for the following components:   WBC 16.7 (*)    RBC 3.18 (*)    Hemoglobin 8.8 (*)    HCT 30.2 (*)    MCHC 29.1 (*)    RDW 16.7 (*)    Neutro Abs 16.0 (*)    Lymphs Abs 0.2 (*)    Abs Immature Granulocytes 0.11 (*)    All other components within normal limits  RESP PANEL BY RT PCR (RSV, FLU A&B, COVID)  MAGNESIUM  QUANTIFERON-TB GOLD PLUS    EKG EKG Interpretation  Date/Time:  Saturday April 17 2020 14:27:32 EST Ventricular Rate:  96 PR Interval:    QRS Duration: 129 QT Interval:  356 QTC Calculation: 450 R Axis:   45 Text Interpretation: Sinus rhythm Nonspecific intraventricular conduction delay Borderline repolarization abnormality No sig change from prior ecg Confirmed by Octaviano Glow [phone]) on 04/17/2020 2:29:24 PM   Radiology DG Chest 2 View  Result Date: 04/17/2020 CLINICAL DATA:  Shortness of breath EXAM: CHEST - 2 VIEW COMPARISON:  April 12, 2020 FINDINGS: The cardiomediastinal silhouette is unchanged and enlarged in contour.Perihilar vascular fullness, unchanged. Small bilateral pleural effusions, similar comparison to prior. No pneumothorax. Diffuse interstitial prominence. Patchy bibasilar heterogeneous opacities, unchanged. Visualized abdomen is unremarkable. Multilevel degenerative changes of the thoracic spine. Degenerative changes of the RIGHT acromioclavicular joint. IMPRESSION: Constellation of findings are favored to reflect a combination of pulmonary edema and atelectasis. Superimposed infection remains in the differential. Electronically Signed   By: Valentino Saxon MD   On: 04/17/2020 14:59    Procedures Procedures (including critical care time)  Medications Ordered in ED Medications  Chlorhexidine Gluconate Cloth 2 % PADS 6 each (has no administration in time range)    pentafluoroprop-tetrafluoroeth (GEBAUERS) aerosol 1 application (has no administration in time range)  lidocaine (PF) (XYLOCAINE) 1 % injection 5 mL (has no administration in time range)  lidocaine-prilocaine (EMLA) cream 1 application (has no administration in time range)  0.9 %  sodium chloride infusion (has no administration in time range)  0.9 %  sodium chloride infusion (has no administration in time range)  acetaminophen (TYLENOL) tablet 500 mg (has no administration in time range)    ED Course  I have reviewed the triage vital signs and the nursing notes.  Pertinent labs & imaging results that were available during my care of the patient were reviewed by me and considered in my medical decision making (see chart for details).    MDM Rules/Calculators/A&P  Patient presents chief complaint of missing dialysis.  Patient was alert, does not appear in acute distress, vital signs reassuring, will order basic lab work EKG x-ray for further evaluation.  CBC shows leukocytosis of 16.7, normocytic anemia appears to be a baseline for patient, CMP showing slight hyponatremia of 779, no metabolic acidosis, hyperglycemia of 240, elevated BUN of 43, creatinine 4.23 at baseline for patient, hypoalbuminemia at baseline for patient.  Magnesium 1.7, chest x-ray shows constellation of findings favored to reflect a combination of pulmonary edema and atelectasis.  Possible superimposed infection.  EKG sinus rhythm unchanged from previous EKGs, no signs of ischemia ST elevation depression, no arrhythmias noted.  Will start dialysis today and have him continue with his normal dialysis schedule on Monday.  Will give [facility] for patient receives his dialysis.  Patient was reassessed after hemodialysis states he is feeling much better, has no complaints at this time.  Low suspicion for ACS or arrhythmias as patient denies chest pain, shortness of breath, no signs of hypoperfusion fluid  overload on exam, EKG was sinus rhythm without signs of ischemia.  Patient did complain of left leg weakness, on exam there is no objective weakness noted or neuro deficits noted, no signs of limb ischemia or increased swelling for concerns of possible DVT.  I suspect patient's weakness may be secondary to his chronic bilateral leg pain.  Low suspicion for systemic infection as patient is nontoxic-appearing, vital signs reassuring.  Patient does have isolated leukocytosis of 16.7, with an x-ray which shows possible respiratory infection versus edema.  Possible patient may be suffering from the beginning of a pneumonia will start him on doxycycline and have him follow-up outpatient for further evaluation.   Vital signs have remained stable, no indication for hospital admission.  Patient discussed with attending and they agreed with assessment and plan.  Patient given at home care as well strict return precautions.  Patient verbalized that they understood agreed to said plan.  Final Clinical Impression(s) / ED Diagnoses Final diagnoses:  Encounter for hemodialysis [facility])    Rx / DC Orders ED Discharge Orders         Ordered    doxycycline (VIBRAMYCIN) 100 MG capsule  2 times daily        04/17/20 2059           Aron Baba 04/17/20 2148    Wyvonnia Dusky, MD 04/18/20 [phone]

## 2020-04-17 NOTE — ED Notes (Signed)
Pt provided with a warm blanket and pillow per request. Pt repositioned at this time. All questions and concerns voiced addressed. Bed is locked in the lowest position, side rails x2, cal bell within reach. Will continue to monitor at this time.

## 2020-04-17 NOTE — ED Notes (Signed)
Pt provided with graham crackers, Ginger Ale and peanut butter per request. All questions and concerns voiced addressed at this time. Will continue to monitor. Bed is locked in the lowest position, side rails x2, call bell within reach.

## 2020-04-21 NOTE — Progress Notes (Deleted)
Cardiology Office Note  Date: 04/21/2020   ID: Mark [name], DOB 06/08/1955, MRN [id number]  PCP:  Leeanne Rio, MD  Cardiologist:  No primary care provider on file. Electrophysiologist:  None   Chief Complaint: Follow-up chronic systolic heart failure, CAD, ESRD on dialysis  History of Present Illness: Mark [name] is a 64 y.o. male with a history of CAD, chronic systolic heart failure, ESRD, HLD, HTN.  His last encounter was with Dr. Bronson Ing on 07/15/2019 he denied any chest pain or shortness of breath.  He did state that this only seemed to occur immediately prior to dialysis due to hypervolemia.  His blood pressure is usually low immediately after dialysis.  EF was 35 to 40% by echo in April 2020.  He did not tolerate long-acting nitrates.  Unable to use beta-blockers secondary to long history of bradycardia.  Unable to use ACEs or ARB secondary to end-stage renal disease.  He was continue his Lipitor.  His blood pressure was markedly elevated on that visit but he had not taken his morning medications.  His coronary artery disease was symptomatically stable.  His nuclear stress test on 12/12/2018 showed evidence of MI with minimal peri-infarct ischemia.  Patient was here last visit status post hospital stay for anterior/posterior L3-S1 ORIF.  After extubation patient converted to ventricular tachycardia in the OR suite.  He was cardioverted and started on amiodarone.  Cardiology was consulted to manage.  EP was also consulted and recommended outpatient catheterization for further management if he would benefit from defibrillator placement.  EP mentioned that in the future they could do an EP study and if able to induce ventricular tachycardia, defibrillator could be implanted.  Meanwhile advised to continue with medical therapy for now until he went to rehab including carvedilol and possible amiodarone.  He has ischemic cardiomyopathy with LVEF of 35 to 40%. He received blood  transfusion for anemia while in the hospital.  Had been receiving inpatient hemodialysis as well.  Patient stated he was going 3-4 times per week to dialysis for 4.5 hours each time to decrease the fluid.  Patient states his blood pressure is always better after dialysis.  Blood pressure on arrival today is 150/62.  He was in a wheelchair with supplemental O2.  He denied any palpitations or arrhythmias.  Recently admitted for dyspnea and hypoxia after HD. He had been experiencing progressively worsening dyspnea 3 weeks prior to presentation.  He was being treated for pneumonia.  He was complaining of productive cough, and lower extremity edema.  He was volume overloaded.  He was dialyzed x 2 during admission.  He had substantial fluid removed during dialysis episodes while in hospital.  He had a repeat echocardiogram during admission.  The results of which have not been released.   Has significant anemia with H/H 7.8 / 26.2 at discharge. Receives darbepoetin.  He presents today complaining of significant fatigue.  He is resting in a wheelchair with supplemental O2.  He has very minimal lower extremity edema today.  He is on amoxicillin for abscessed tooth.    Past Medical History:  Diagnosis Date  . Anxiety    occas. panic attack, takes xanax occas  . Arthritis    herniated disc, lumbar   . Back pain   . CAD (coronary artery disease)    03/25/2015 95% prox to mid LAD treated with Promus Premier DES 3.5x27mm postdilated to 4.14mm, 100% prox to distal RCA lesion filled in the PDA region via  collaterals from LAD and PLA  . Cancer ([place])    - skin ca on face- removed   . CHF (congestive heart failure) ([place])   . Chronic kidney disease   . Complication of anesthesia    " it takes a long time to get over it."   . Diabetes mellitus without complication ([place])    type 2  . Dialysis patient ([place])   . Fibromyalgia   . GERD (gastroesophageal reflux disease)    otc- pepcid , approx. every other  day    .  H/O acute respiratory failure   . Hematuria    being followed by Dr. Hinda Lenis for decreased kidney function   . History of blood product transfusion 02/13/2014   After having back surgery  . Hypercholesteremia   . Hypertension   . Myocardial infarction ([place]) 03/2015  . Pneumonia    "walking"  . Shortness of breath dyspnea   . Sleep apnea    test aborted, due to not able to relax , since the aborted test he had surgery for gallbladder & he reports that he was told that he has sleep apnea     Past Surgical History:  Procedure Laterality Date  . A/V FISTULAGRAM Left 10/31/2016   Procedure: A/V Fistulagram;  Surgeon: Serafina Mitchell, MD;  Location: [facility];  Service: Cardiovascular;  Laterality: Left;  . AV FISTULA PLACEMENT Left 02/02/2015   Procedure: LEFT ARM RADIOCEPHALIC ARTERIOVENOUS (AV) FISTULA CREATION;  Surgeon: Conrad Carnelian Bay, MD;  Location: [facility];  Service: Vascular;  Laterality: Left;  . AV FISTULA PLACEMENT Left 05/25/2016   Procedure: ARTERIOVENOUS (AV) FISTULA CREATION;  Surgeon: [name], MD;  Location: [facility];  Service: Vascular;  Laterality: Left;  . BACK SURGERY  02/13/14  . BIOPSY  03/08/2018   Procedure: BIOPSY;  Surgeon: Rogene Houston, MD;  [facility];  Service: Endoscopy;;  gastric  . CARDIAC CATHETERIZATION N/A 03/25/2015   Procedure: Left Heart Cath and Coronary Angiography;  Surgeon: Leonie Man, MD;  Location: [facility];  Service: Cardiovascular;  Laterality: N/A;  . CARDIAC CATHETERIZATION N/A 03/25/2015   Procedure: Coronary Stent Intervention;  Surgeon: Leonie Man, MD;  Location: [facility];  Service: Cardiovascular;  Laterality: N/A;  . CHOLECYSTECTOMY    . COLONOSCOPY    . COLONOSCOPY WITH PROPOFOL N/A 03/08/2018   Procedure: COLONOSCOPY WITH PROPOFOL;  Surgeon: Rogene Houston, MD;  [facility];  Service: Endoscopy;  Laterality: N/A;  11:10  . EYE SURGERY     cataracts remove,  bilateral, w/IOL.  Marland Kitchen INSERTION OF DIALYSIS CATHETER N/A 03/09/2015   Procedure: INSERTION OF DIALYSIS CATHETER;  Surgeon: Conrad Broadlands, MD;  Location: [facility];  Service: Vascular;  Laterality: N/A;  . INSERTION OF DIALYSIS CATHETER Left 04/07/2016   Procedure: INSERTION OF TUNNELED DIALYSIS CATHETER LEFT INTERNAL JUGULAR, ATTEMPTED INSERTION OF TUNNELLED CATHETER RIGHT INTERNAL JUGULAR;  Surgeon: [name], MD;  [facility];  Service: Vascular;  Laterality: Left;  . IR GENERIC HISTORICAL  07/28/2016   IR FLUORO GUIDE CV LINE LEFT 07/28/2016 Greggory Keen, MD [facility]  . IR REMOVAL TUN CV CATH W/O FL  03/28/2017  . LUMBAR LAMINECTOMY/DECOMPRESSION MICRODISCECTOMY Right 02/13/2014   Procedure: RIGHT LUMBAR FOUR-FIVE, RIGHT LUMBAR FIVE- SACRAL ONE LUMBAR LAMINECTOMY/DECOMPRESSION MICRODISCECTOMY ;  Surgeon: Ashok Pall, MD;  Location: [facility];  Service: Neurosurgery;  Laterality: Right;  Right L4-5 and Right L5-S1 diskectomies  .  PERIPHERAL VASCULAR BALLOON ANGIOPLASTY Left 10/31/2016   Procedure: Peripheral Vascular Balloon Angioplasty;  Surgeon: Serafina Mitchell, MD;  Location: [facility];  Service: Cardiovascular;  Laterality: Left;  AV fistula  . PERIPHERAL VASCULAR BALLOON ANGIOPLASTY Left 01/28/2019   Procedure: PERIPHERAL VASCULAR BALLOON ANGIOPLASTY;  Surgeon: Serafina Mitchell, MD;  Location: [facility];  Service: Cardiovascular;  Laterality: Left;  arm fistula  . TEE WITHOUT CARDIOVERSION N/A 05/02/2016   Procedure: TRANSESOPHAGEAL ECHOCARDIOGRAM (TEE);  Surgeon: Herminio Commons, MD;  [facility];  Service: Cardiovascular;  Laterality: N/A;  . TONSILLECTOMY      Current Outpatient Medications  Medication Sig Dispense Refill  . albuterol (PROAIR HFA) 108 (90 Base) MCG/ACT inhaler Inhale 2 puffs into the lungs every 4 (four) hours as needed.    Marland Kitchen amoxicillin (AMOXIL) 875 MG tablet Take 875 mg by mouth 2 (two) times daily.    Marland Kitchen aspirin EC 81 MG  tablet Take 1 tablet (81 mg total) by mouth daily.    Marland Kitchen atorvastatin (LIPITOR) 40 MG tablet Take 1 tablet (40 mg total) by mouth daily.    Marland Kitchen BELBUCA 900 MCG FILM Take 1 Film by mouth 2 (two) times daily.    . betamethasone dipropionate (DIPROLENE) 0.05 % cream Apply 1 application topically 2 (two) times daily as needed (irritation).    . carvedilol (COREG) 6.25 MG tablet Take 1 tablet by mouth in the morning and at bedtime.    . cloNIDine (CATAPRES) 0.1 MG tablet Take 0.1 mg by mouth daily.    Marland Kitchen doxycycline (VIBRAMYCIN) 100 MG capsule Take 1 capsule (100 mg total) by mouth 2 (two) times daily for 7 days. 14 capsule 0  . DULoxetine (CYMBALTA) 60 MG capsule Take 60 mg by mouth daily.     . hydrALAZINE (APRESOLINE) 25 MG tablet TAKE ONE TABLET BY MOUTH THREE TIMES DAILY 90 tablet 6  . levothyroxine (SYNTHROID) 100 MCG tablet Take 100 mcg by mouth daily.    Marland Kitchen lidocaine-prilocaine (EMLA) cream Apply 1 application topically as needed (before dialysis).    . nitroGLYCERIN (NITROSTAT) 0.4 MG SL tablet Place 1 tablet (0.4 mg total) under the tongue every 5 (five) minutes x 3 doses as needed for chest pain (If no relief after 3rd dose, proceed to the ED for an evaluation). I 25 tablet 0  . OXYGEN Inhale 3.5 L into the lungs continuous.     . polyethylene glycol (MIRALAX / GLYCOLAX) 17 g packet Take 17 g by mouth as needed.     . predniSONE (DELTASONE) 20 MG tablet Take 1 tablet (20 mg total) by mouth 2 (two) times daily. 10 tablet 0  . sucralfate (CARAFATE) 1 g tablet Take 1 g by mouth 3 (three) times daily.    . traMADol (ULTRAM) 50 MG tablet Take 50 mg by mouth 2 (two) times daily as needed.     No current facility-administered medications for this visit.   Facility-Administered Medications Ordered in Other Visits  Medication Dose Route Frequency Provider Last Rate Last Admin  . 0.9 %  sodium chloride infusion   Intravenous Continuous Monia Sabal, PA-C   New Bag at 03/08/18 0920  . ceFAZolin  (ANCEF) 2 g in dextrose 5 % 100 mL IVPB  2 g Intravenous Once Monia Sabal, PA-C       Allergies:  Morphine and related and Varenicline   Social History: The patient  reports that he has been smoking cigarettes. He started smoking about 46 years  ago. He has a 20.00 pack-year smoking history. He has never used smokeless tobacco. He reports that he does not drink alcohol and does not use drugs.   Family History: The patient's family history includes Cancer in his father; Colon cancer in his maternal grandfather; Heart attack in his maternal grandfather; Heart failure in his maternal grandmother; Prostate cancer in his father. He was adopted.   ROS:  Please see the history of present illness. Otherwise, complete review of systems is positive for none.  All other systems are reviewed and negative.   Physical Exam: VS:  There were no vitals taken for this visit., BMI There is no height or weight on file to calculate BMI.  Wt Readings from Last 3 Encounters:  04/17/20 224 lb 13.9 oz (102 kg)  04/12/20 215 lb (97.5 kg)  01/15/20 219 lb (99.3 kg)    General:  Patient appears comfortable at rest. Neck: Supple, no elevated JVP or carotid bruits, no thyromegaly. Lungs: Clear to auscultation, nonlabored breathing at rest. Cardiac: Regular rate and rhythm, no S3 or significant systolic murmur, no pericardial rub. Extremities: Mild nonpitting edema, distal pulses 2+. Skin: Warm and dry. Musculoskeletal: No kyphosis. Neuropsychiatric: Alert and oriented x3, affect grossly appropriate.  ECG:  EKG on 01/09/2020 showed sinus rhythm rate of 96, prolonged PR interval at 0.21, probable left atrial enlargement, intraventricular conduction defect, consider atypical left bundle branch block.  Recent Labwork: 04/12/2020: B Natriuretic Peptide 3,562.0 04/17/2020: ALT 22; AST 17; BUN 43; Creatinine, Ser 4.23; Hemoglobin 8.8; Magnesium 1.7; Platelets 309; Potassium 5.0; Sodium 134     Component Value Date/Time    CHOL 111 02/13/2015 0225   TRIG 54 02/13/2015 0225   HDL 31 (L) 02/13/2015 0225   CHOLHDL 3.6 02/13/2015 0225   VLDL 11 02/13/2015 0225   LDLCALC 69 02/13/2015 0225    Other Studies Reviewed Today:  Echocardiogram 01/10/2020 1. Left ventricular ejection fraction, by estimation, is 40 to 45%. The left ventricle has mildly decreased function. The left ventricle demonstrates global hypokinesis. The left ventricular internal cavity size was moderately dilated. There is moderate left ventricular hypertrophy. Left ventricular diastolic parameters are consistent with Grade II diastolic dysfunction (pseudonormalization). Elevated left atrial pressure. 2. Right ventricular systolic function is normal. The right ventricular size is normal. 3. Left atrial size was severely dilated. 4. Right atrial size was severely dilated. 5. The mitral valve is normal in structure. Trivial mitral valve regurgitation. No evidence of mitral stenosis. 6. The aortic valve is tricuspid. Aortic valve regurgitation is not visualized. Mild aortic valve stenosis. 7. The inferior vena cava is dilated in size with >50% respiratory variability, suggesting right atrial pressure of 8 mmHg.   Echocardiogram 09/24/2019 at [facility] SUMMARY  There is moderate eccentric left ventricular hypertrophy with basal and mid inferoseptal akinesis and moderate hypokinesis of remaining visualized segments of the left ventricle with probably moderately reduced systolic function. LV ejection fraction 35-40%. Left ventricular filling pattern is indeterminate. The left atrium is moderately dilated. There is mild aortic stenosis. RVSP not able to be calculated. LV and RV function appear to be improved compared with 2020 study. - FINDINGS: LEFT VENTRICLE The left ventricle is mildly dilated. There is moderate eccentric left ventricular hypertrophy. Left ventricular systolic function  is moderately reduced. LV ejection fraction = 35-40%. Left ventricular filling pattern is indeterminate. There is moderate global hypokinesis of the left ventricle. Basal and mid inferoseptal akinesis.  - RIGHT VENTRICLE The right ventricle is  normal size. The right ventricular systolic function is normal.  LEFT ATRIUM The left atrium is moderately dilated.  RIGHT ATRIUM Right atrium not well visualized. - AORTIC VALVE The aortic valve is normal in structure and function. The aortic valve is trileaflet. There is mild aortic stenosis. There is trace aortic regurgitation. - MITRAL VALVE The mitral valve is normal in structure and function. The mitral valve leaflets appear thickened, but open well. There is trace mitral regurgitation. - TRICUSPID VALVE The tricuspid valve is not well visualized. There is trace tricuspid regurgitation. RVSP not able to be calculated. - PULMONIC VALVE The pulmonic valve is not well visualized. - ARTERIES The aortic sinus is normal size.   Coronary angiography on 03/25/15 demonstrated a 95% stenosis in the proximal to mid LAD for which a drug-eluting stent was placed. He also had 100% stenosis of the proximal to distal RCA lesion. The RCA filled in the PDA region via collaterals from the LAD and PL via the circumflex. Long-term Brilinta was recommended.   Echo (10/02/18 at [facility])  SUMMARY The left ventricle is severely dilated. Mild left ventricular hypertrophy  Left ventricular systolic function is moderately reduced. LV ejection fraction = 35-40%. Unable to fully assess LV regional wall motion The right ventricle is normal size. The right ventricular systolic function is normal. The left atrium is mildly to moderately dilated. The right atrium is mildly dilated. There is mild aortic stenosis. There was insufficient TR detected to calculate RV systolic pressure. There is mild tricuspid regurgitation. No significant stenosis  seen Estimated right atrial pressure is 10 mmHg.. Mild to moderate pulmonic valvular regurgitation. There is no pericardial effusion. There is a pleural effusion present. Probably no significant change in comparison with the prior study noted  Nuclear stress test 12/12/2018:   There was no ST segment deviation noted during stress.  Findings consistent with prior moderate inferior myocardial infarction with minimal peri-infarct ischemia.  This is an intermediate risk study. Risk based primarily on decreased LVEF, fairly minimal amout of myocardium currently at jeopardy.  The left ventricular ejection fraction is moderately decreased (30-44%).    Assessment and Plan:   1. Chronic systolic (congestive) heart failure (HCC) Recent admission for acute on chronic systolic heart failure.  Patient was dialyzed x2 during hospital stay with significant fluid removal.  Repeat echocardiogram was performed which has not been released at this time.  Patient and wife stated there has been discussion in the past of possible ICD placement due to low EF and history of V. tach.  Previous echo from [facility] earlier this year showed EF of 35 to 40%.  Advised patient and his wife we would notify them of echo results once we receive the report.  2. Hyperlipidemia LDL goal <70 Continue atorvastatin 40 mg daily.  3. Essential hypertension Blood pressure today 128/60.  He had dialysis today.  Patient states once he has dialysis, his blood pressure gets better.  Continue hydralazine 25 mg p.o. twice daily.  Clonidine 0.1 mg p.o. daily, carvedilol 6.25 mg p.o. twice daily  4. ESRD (end stage renal disease) on dialysis ([place]) Continues with hemodialysis 3-4 times per week at 4.5 hours each.  Has a left upper extremity AV fistula with positive thrill and bruit.  Patient states his dry weight is 98/99 kg.  He states today after dialysis he was at his dry weight.  5. CAD in native artery 03/31/2015.   95% proximal to mid LAD treated with Promus Premier DES 3.5 x  16 mm postdilated to 4.1 mm, 100% proximal to distal RCA lesion filled in the PDA region via collaterals from LAD and PLA.  Patient Denies any anginal or exertional symptoms.  He is not very active.  He had a recent episode of sustained ventricular tachycardia in the operating suite after back surgery. He was defibrillated with 200 J.  Given IV amiodarone and calcium.  He had occasional NSVT afterwards while being monitored on telemetry.   Continue aspirin 81 mg.  Carvedilol 6.25 mg p.o. twice daily.  Nitroglycerin sublingual 0.4 mg as needed for chest pain.   Medication Adjustments/Labs and Tests Ordered: Current medicines are reviewed at length with the patient today.  Concerns regarding medicines are outlined above.   Disposition: Follow-up with Dr. Harl Bowie or APP 3 months.  Signed, Levell July, NP 04/21/2020 8:33 PM    [facility] [place], [place], [place] [address] Phone: [phone]; Fax: [phone]

## 2020-04-22 ENCOUNTER — Emergency Department (HOSPITAL_COMMUNITY): Payer: Medicare Other

## 2020-04-22 ENCOUNTER — Inpatient Hospital Stay (HOSPITAL_COMMUNITY)
Admission: EM | Admit: 2020-04-22 | Discharge: 2020-04-27 | DRG: 291 | Disposition: A | Discharge status: Skilled Nursing Facility | Payer: Medicare Other | Attending: Family Medicine | Admitting: Family Medicine

## 2020-04-22 ENCOUNTER — Ambulatory Visit: Payer: BC Managed Care – PPO | Admitting: Family Medicine

## 2020-04-22 ENCOUNTER — Observation Stay (HOSPITAL_COMMUNITY): Payer: Medicare Other

## 2020-04-22 ENCOUNTER — Other Ambulatory Visit: Payer: Self-pay

## 2020-04-22 DIAGNOSIS — I10 Essential (primary) hypertension: Secondary | ICD-10-CM | POA: Diagnosis present

## 2020-04-22 DIAGNOSIS — E7800 Pure hypercholesterolemia, unspecified: Secondary | ICD-10-CM | POA: Diagnosis present

## 2020-04-22 DIAGNOSIS — K219 Gastro-esophageal reflux disease without esophagitis: Secondary | ICD-10-CM | POA: Diagnosis present

## 2020-04-22 DIAGNOSIS — I252 Old myocardial infarction: Secondary | ICD-10-CM

## 2020-04-22 DIAGNOSIS — F17210 Nicotine dependence, cigarettes, uncomplicated: Secondary | ICD-10-CM | POA: Diagnosis present

## 2020-04-22 DIAGNOSIS — M797 Fibromyalgia: Secondary | ICD-10-CM | POA: Diagnosis present

## 2020-04-22 DIAGNOSIS — I2510 Atherosclerotic heart disease of native coronary artery without angina pectoris: Secondary | ICD-10-CM | POA: Diagnosis present

## 2020-04-22 DIAGNOSIS — R9431 Abnormal electrocardiogram [ECG] [EKG]: Secondary | ICD-10-CM | POA: Diagnosis present

## 2020-04-22 DIAGNOSIS — E875 Hyperkalemia: Secondary | ICD-10-CM | POA: Diagnosis not present

## 2020-04-22 DIAGNOSIS — K5900 Constipation, unspecified: Secondary | ICD-10-CM | POA: Diagnosis present

## 2020-04-22 DIAGNOSIS — Z79890 Hormone replacement therapy (postmenopausal): Secondary | ICD-10-CM

## 2020-04-22 DIAGNOSIS — R296 Repeated falls: Secondary | ICD-10-CM | POA: Diagnosis present

## 2020-04-22 DIAGNOSIS — F410 Panic disorder [episodic paroxysmal anxiety] without agoraphobia: Secondary | ICD-10-CM | POA: Diagnosis present

## 2020-04-22 DIAGNOSIS — Z955 Presence of coronary angioplasty implant and graft: Secondary | ICD-10-CM

## 2020-04-22 DIAGNOSIS — E1122 Type 2 diabetes mellitus with diabetic chronic kidney disease: Secondary | ICD-10-CM | POA: Diagnosis present

## 2020-04-22 DIAGNOSIS — I5023 Acute on chronic systolic (congestive) heart failure: Secondary | ICD-10-CM | POA: Diagnosis not present

## 2020-04-22 DIAGNOSIS — I5043 Acute on chronic combined systolic (congestive) and diastolic (congestive) heart failure: Secondary | ICD-10-CM | POA: Diagnosis present

## 2020-04-22 DIAGNOSIS — Z7189 Other specified counseling: Secondary | ICD-10-CM

## 2020-04-22 DIAGNOSIS — E785 Hyperlipidemia, unspecified: Secondary | ICD-10-CM | POA: Diagnosis present

## 2020-04-22 DIAGNOSIS — Z66 Do not resuscitate: Secondary | ICD-10-CM | POA: Diagnosis present

## 2020-04-22 DIAGNOSIS — I132 Hypertensive heart and chronic kidney disease with heart failure and with stage 5 chronic kidney disease, or end stage renal disease: Principal | ICD-10-CM | POA: Diagnosis present

## 2020-04-22 DIAGNOSIS — D631 Anemia in chronic kidney disease: Secondary | ICD-10-CM | POA: Diagnosis present

## 2020-04-22 DIAGNOSIS — N186 End stage renal disease: Secondary | ICD-10-CM | POA: Diagnosis present

## 2020-04-22 DIAGNOSIS — J9621 Acute and chronic respiratory failure with hypoxia: Secondary | ICD-10-CM | POA: Diagnosis present

## 2020-04-22 DIAGNOSIS — Z79899 Other long term (current) drug therapy: Secondary | ICD-10-CM

## 2020-04-22 DIAGNOSIS — Z9119 Patient's noncompliance with other medical treatment and regimen: Secondary | ICD-10-CM

## 2020-04-22 DIAGNOSIS — Z9981 Dependence on supplemental oxygen: Secondary | ICD-10-CM

## 2020-04-22 DIAGNOSIS — F32A Depression, unspecified: Secondary | ICD-10-CM | POA: Diagnosis present

## 2020-04-22 DIAGNOSIS — M25552 Pain in left hip: Secondary | ICD-10-CM

## 2020-04-22 DIAGNOSIS — Z7982 Long term (current) use of aspirin: Secondary | ICD-10-CM

## 2020-04-22 DIAGNOSIS — Z992 Dependence on renal dialysis: Secondary | ICD-10-CM

## 2020-04-22 DIAGNOSIS — Z9115 Patient's noncompliance with renal dialysis: Secondary | ICD-10-CM

## 2020-04-22 DIAGNOSIS — E876 Hypokalemia: Secondary | ICD-10-CM | POA: Diagnosis present

## 2020-04-22 DIAGNOSIS — Z515 Encounter for palliative care: Secondary | ICD-10-CM

## 2020-04-22 DIAGNOSIS — Z20822 Contact with and (suspected) exposure to covid-19: Secondary | ICD-10-CM | POA: Diagnosis present

## 2020-04-22 DIAGNOSIS — I255 Ischemic cardiomyopathy: Secondary | ICD-10-CM | POA: Diagnosis present

## 2020-04-22 DIAGNOSIS — N2581 Secondary hyperparathyroidism of renal origin: Secondary | ICD-10-CM | POA: Diagnosis present

## 2020-04-22 DIAGNOSIS — G894 Chronic pain syndrome: Secondary | ICD-10-CM | POA: Diagnosis present

## 2020-04-22 DIAGNOSIS — F329 Major depressive disorder, single episode, unspecified: Secondary | ICD-10-CM | POA: Diagnosis present

## 2020-04-22 DIAGNOSIS — R627 Adult failure to thrive: Secondary | ICD-10-CM | POA: Diagnosis present

## 2020-04-22 LAB — CBC WITH DIFFERENTIAL/PLATELET
Abs Immature Granulocytes: 0.09 10*3/uL — ABNORMAL HIGH (ref 0.00–0.07)
Basophils Absolute: 0 10*3/uL (ref 0.0–0.1)
Basophils Relative: 0 %
Eosinophils Absolute: 0.1 10*3/uL (ref 0.0–0.5)
Eosinophils Relative: 1 %
HCT: 30.1 % — ABNORMAL LOW (ref 39.0–52.0)
Hemoglobin: 8.7 g/dL — ABNORMAL LOW (ref 13.0–17.0)
Immature Granulocytes: 1 %
Lymphocytes Relative: 2 %
Lymphs Abs: 0.3 10*3/uL — ABNORMAL LOW (ref 0.7–4.0)
MCH: 27.7 pg (ref 26.0–34.0)
MCHC: 28.9 g/dL — ABNORMAL LOW (ref 30.0–36.0)
MCV: 95.9 fL (ref 80.0–100.0)
Monocytes Absolute: 0.8 10*3/uL (ref 0.1–1.0)
Monocytes Relative: 6 %
Neutro Abs: 12.6 10*3/uL — ABNORMAL HIGH (ref 1.7–7.7)
Neutrophils Relative %: 90 %
Platelets: 251 10*3/uL (ref 150–400)
RBC: 3.14 MIL/uL — ABNORMAL LOW (ref 4.22–5.81)
RDW: 17 % — ABNORMAL HIGH (ref 11.5–15.5)
WBC: 13.8 10*3/uL — ABNORMAL HIGH (ref 4.0–10.5)
nRBC: 0 % (ref 0.0–0.2)

## 2020-04-22 LAB — TROPONIN I (HIGH SENSITIVITY): Troponin I (High Sensitivity): 50 ng/L — ABNORMAL HIGH (ref <18)

## 2020-04-22 LAB — BASIC METABOLIC PANEL
Anion gap: 10 (ref 5–15)
BUN: 49 mg/dL — ABNORMAL HIGH (ref 8–23)
CO2: 28 mmol/L (ref 22–32)
Calcium: 8.3 mg/dL — ABNORMAL LOW (ref 8.9–10.3)
Chloride: 94 mmol/L — ABNORMAL LOW (ref 98–111)
Creatinine, Ser: 4.2 mg/dL — ABNORMAL HIGH (ref 0.61–1.24)
GFR, Estimated: 15 mL/min — ABNORMAL LOW (ref >60)
Glucose, Bld: 137 mg/dL — ABNORMAL HIGH (ref 70–99)
Potassium: 5.9 mmol/L — ABNORMAL HIGH (ref 3.5–5.1)
Sodium: 132 mmol/L — ABNORMAL LOW (ref 135–145)

## 2020-04-22 LAB — RESP PANEL BY RT-PCR (FLU A&B, COVID) ARPGX2
Influenza A by PCR: NEGATIVE
Influenza B by PCR: NEGATIVE
SARS Coronavirus 2 by RT PCR: NEGATIVE

## 2020-04-22 MED ORDER — SODIUM ZIRCONIUM CYCLOSILICATE 5 G PO PACK
10.0000 g | PACK | Freq: Once | ORAL | Status: AC
  Administered 2020-04-22: 10 g via ORAL
  Filled 2020-04-22: qty 2

## 2020-04-22 MED ORDER — ATORVASTATIN CALCIUM 40 MG PO TABS
40.0000 mg | ORAL_TABLET | Freq: Every day | ORAL | Status: DC
  Administered 2020-04-23 – 2020-04-27 (×5): 40 mg via ORAL
  Filled 2020-04-22 (×5): qty 1

## 2020-04-22 MED ORDER — ACETAMINOPHEN 325 MG PO TABS
650.0000 mg | ORAL_TABLET | Freq: Four times a day (QID) | ORAL | Status: DC | PRN
  Administered 2020-04-27: 650 mg via ORAL
  Filled 2020-04-22 (×2): qty 2

## 2020-04-22 MED ORDER — DULOXETINE HCL 60 MG PO CPEP
60.0000 mg | ORAL_CAPSULE | Freq: Every day | ORAL | Status: DC
  Administered 2020-04-22 – 2020-04-27 (×6): 60 mg via ORAL
  Filled 2020-04-22 (×3): qty 1
  Filled 2020-04-22 (×2): qty 2
  Filled 2020-04-22: qty 1

## 2020-04-22 MED ORDER — ONDANSETRON HCL 4 MG/2ML IJ SOLN: 4.0000 mg | Freq: Four times a day (QID) | INTRAMUSCULAR | Status: DC | PRN

## 2020-04-22 MED ORDER — LEVOTHYROXINE SODIUM 100 MCG PO TABS
100.0000 ug | ORAL_TABLET | Freq: Every day | ORAL | Status: DC
  Administered 2020-04-23 – 2020-04-27 (×5): 100 ug via ORAL
  Filled 2020-04-22 (×3): qty 1
  Filled 2020-04-22: qty 2
  Filled 2020-04-22: qty 1

## 2020-04-22 MED ORDER — ASPIRIN EC 81 MG PO TBEC
81.0000 mg | DELAYED_RELEASE_TABLET | Freq: Every day | ORAL | Status: DC
  Administered 2020-04-23 – 2020-04-27 (×5): 81 mg via ORAL
  Filled 2020-04-22 (×5): qty 1

## 2020-04-22 MED ORDER — TRAMADOL HCL 50 MG PO TABS
50.0000 mg | ORAL_TABLET | Freq: Once | ORAL | Status: AC
  Administered 2020-04-22: 50 mg via ORAL
  Filled 2020-04-22: qty 1

## 2020-04-22 MED ORDER — ACETAMINOPHEN 650 MG RE SUPP: 650.0000 mg | Freq: Four times a day (QID) | RECTAL | Status: DC | PRN

## 2020-04-22 MED ORDER — SUCRALFATE 1 G PO TABS
1.0000 g | ORAL_TABLET | Freq: Three times a day (TID) | ORAL | Status: DC
  Administered 2020-04-22 – 2020-04-27 (×12): 1 g via ORAL
  Filled 2020-04-22 (×13): qty 1

## 2020-04-22 MED ORDER — BUPRENORPHINE HCL 900 MCG BU FILM
1.0000 | ORAL_FILM | Freq: Two times a day (BID) | BUCCAL | Status: DC
  Administered 2020-04-23 – 2020-04-27 (×6): 1 via ORAL
  Filled 2020-04-22 (×2): qty 1

## 2020-04-22 MED ORDER — HEPARIN SODIUM (PORCINE) 5000 UNIT/ML IJ SOLN
5000.0000 [IU] | Freq: Three times a day (TID) | INTRAMUSCULAR | Status: DC
  Administered 2020-04-22 – 2020-04-27 (×10): 5000 [IU] via SUBCUTANEOUS
  Filled 2020-04-22 (×11): qty 1

## 2020-04-22 MED ORDER — ONDANSETRON HCL 4 MG PO TABS: 4.0000 mg | ORAL_TABLET | Freq: Four times a day (QID) | ORAL | Status: DC | PRN

## 2020-04-22 MED ORDER — HYDRALAZINE HCL 25 MG PO TABS
25.0000 mg | ORAL_TABLET | Freq: Three times a day (TID) | ORAL | Status: DC
  Administered 2020-04-22 – 2020-04-27 (×8): 25 mg via ORAL
  Filled 2020-04-22 (×11): qty 1

## 2020-04-22 MED ORDER — HYDROCODONE-ACETAMINOPHEN 5-325 MG PO TABS
1.0000 | ORAL_TABLET | Freq: Once | ORAL | Status: AC
  Administered 2020-04-22: 1 via ORAL
  Filled 2020-04-22: qty 1

## 2020-04-22 MED ORDER — POLYETHYLENE GLYCOL 3350 17 G PO PACK: 17.0000 g | PACK | Freq: Every day | ORAL | Status: DC | PRN

## 2020-04-22 NOTE — H&P (Signed)
History and Physical    Mark [name] PPI:[id number] DOB: 1956-04-16 DOA: 04/22/2020  PCP: Leeanne Rio, MD   Patient coming from: Home  I have personally briefly reviewed patient's old medical records in [place]  Chief Complaint: Right hip pain  HPI: Mark [name] is a 64 y.o. male with medical history significant for ESRD, ischemic cardiomyopathy with systolic heart failure, hypertension with chronic respiratory failure on 3 l. Patient presented to the ED with complaints of left hip pain.  Patient has chronic pain in his left hip, but reports over the past 2 days the pain is significantly worsened.  He reports a fall on his left side recently.  He did not hit his head.  He ambulates with a walker.  Patient was also Dialyzed yesterday, but because of his hip pain he was not able to go for dialysis.  Patient reports some increase in his difficulty breathing.  He has chronic lower extremity swelling.  He does not make any urine.  Patient was dialyzed when he was in the ED 04/17/2020. Patient was also in the process of being transitioned over to stay at an assisted living facility/nursing home.  ED Course: 1.6.  Heart rate 90s, blood pressure systolic 063K.  O2 sat greater than 93% on 3 L.  Potassium 5.9.  WBC 13.8.  Pulmonary vascular congestion, volume overload.  Suspected loculated effusion along the lateral right base with atelectatic change in this area.  No consolidation.  Portable pelvic x-ray is negative for fracture no dislocation mild narrowing of left hip joint.. EDP talked to nephrologist Dr. Posey Pronto, patient will be seen in the morning if admitted.  Review of Systems: As per HPI all other systems reviewed and negative.  Past Medical History:  Diagnosis Date  . Anxiety    occas. panic attack, takes xanax occas  . Arthritis    herniated disc, lumbar   . Back pain   . CAD (coronary artery disease)    03/25/2015 95% prox to mid LAD treated with Promus Premier  DES 3.5x80mm postdilated to 4.27mm, 100% prox to distal RCA lesion filled in the PDA region via collaterals from LAD and PLA  . Cancer ([place])    - skin ca on face- removed   . CHF (congestive heart failure) ([place])   . Chronic kidney disease   . Complication of anesthesia    " it takes a long time to get over it."   . Diabetes mellitus without complication ([place])    type 2  . Dialysis patient ([place])   . Fibromyalgia   . GERD (gastroesophageal reflux disease)    otc- pepcid , approx. every other  day    . H/O acute respiratory failure   . Hematuria    being followed by Dr. Hinda Lenis for decreased kidney function   . History of blood product transfusion 02/13/2014   After having back surgery  . Hypercholesteremia   . Hypertension   . Myocardial infarction ([place]) 03/2015  . Pneumonia    "walking"  . Shortness of breath dyspnea   . Sleep apnea    test aborted, due to not able to relax , since the aborted test he had surgery for gallbladder & he reports that he was told that he has sleep apnea     Past Surgical History:  Procedure Laterality Date  . A/V FISTULAGRAM Left 10/31/2016   Procedure: A/V Fistulagram;  Surgeon: Serafina Mitchell, MD;  Location: [facility];  Service:  Cardiovascular;  Laterality: Left;  . AV FISTULA PLACEMENT Left 02/02/2015   Procedure: LEFT ARM RADIOCEPHALIC ARTERIOVENOUS (AV) FISTULA CREATION;  Surgeon: Conrad Fenwick, MD;  Location: [place];  Service: Vascular;  Laterality: Left;  . AV FISTULA PLACEMENT Left 05/25/2016   Procedure: ARTERIOVENOUS (AV) FISTULA CREATION;  Surgeon: [name], MD;  Location: [facility];  Service: Vascular;  Laterality: Left;  . BACK SURGERY  02/13/14  . BIOPSY  03/08/2018   Procedure: BIOPSY;  Surgeon: Rogene Houston, MD;  [facility];  Service: Endoscopy;;  gastric  . CARDIAC CATHETERIZATION N/A 03/25/2015   Procedure: Left Heart Cath and Coronary Angiography;  Surgeon: Leonie Man, MD;  Location: [facility];  Service: Cardiovascular;  Laterality: N/A;  . CARDIAC CATHETERIZATION N/A 03/25/2015   Procedure: Coronary Stent Intervention;  Surgeon: Leonie Man, MD;  Location: [facility];  Service: Cardiovascular;  Laterality: N/A;  . CHOLECYSTECTOMY    . COLONOSCOPY    . COLONOSCOPY WITH PROPOFOL N/A 03/08/2018   Procedure: COLONOSCOPY WITH PROPOFOL;  Surgeon: Rogene Houston, MD;  [facility];  Service: Endoscopy;  Laterality: N/A;  11:10  . EYE SURGERY     cataracts remove, bilateral, w/IOL.  Marland Kitchen INSERTION OF DIALYSIS CATHETER N/A 03/09/2015   Procedure: INSERTION OF DIALYSIS CATHETER;  Surgeon: Conrad , MD;  Location: [place];  Service: Vascular;  Laterality: N/A;  . INSERTION OF DIALYSIS CATHETER Left 04/07/2016   Procedure: INSERTION OF TUNNELED DIALYSIS CATHETER LEFT INTERNAL JUGULAR, ATTEMPTED INSERTION OF TUNNELLED CATHETER RIGHT INTERNAL JUGULAR;  Surgeon: [name], MD;  Location[facility];  Service: Vascular;  Laterality: Left;  . IR GENERIC HISTORICAL  07/28/2016   IR FLUORO GUIDE CV LINE LEFT 07/28/2016 Greggory Keen, MD [facility]  . IR REMOVAL TUN CV CATH W/O FL  03/28/2017  . LUMBAR LAMINECTOMY/DECOMPRESSION MICRODISCECTOMY Right 02/13/2014   Procedure: RIGHT LUMBAR FOUR-FIVE, RIGHT LUMBAR FIVE- SACRAL ONE LUMBAR LAMINECTOMY/DECOMPRESSION MICRODISCECTOMY ;  Surgeon: Ashok Pall, MD;  Location: [place] [facility];  Service: Neurosurgery;  Laterality: Right;  Right L4-5 and Right L5-S1 diskectomies  . PERIPHERAL VASCULAR BALLOON ANGIOPLASTY Left 10/31/2016   Procedure: Peripheral Vascular Balloon Angioplasty;  Surgeon: Serafina Mitchell, MD;  Location: [facility];  Service: Cardiovascular;  Laterality: Left;  AV fistula  . PERIPHERAL VASCULAR BALLOON ANGIOPLASTY Left 01/28/2019   Procedure: PERIPHERAL VASCULAR BALLOON ANGIOPLASTY;  Surgeon: Serafina Mitchell, MD;  Location: [facility];  Service: Cardiovascular;  Laterality: Left;  arm fistula  . TEE  WITHOUT CARDIOVERSION N/A 05/02/2016   Procedure: TRANSESOPHAGEAL ECHOCARDIOGRAM (TEE);  Surgeon: Herminio Commons, MD;  [facility];  Service: Cardiovascular;  Laterality: N/A;  . TONSILLECTOMY       reports that he has been smoking cigarettes. He started smoking about 46 years ago. He has a 20.00 pack-year smoking history. He has never used smokeless tobacco. He reports that he does not drink alcohol and does not use drugs.  Allergies  Allergen Reactions  . Morphine And Related     Upset stomach   . Varenicline Other (See Comments)    Causes vivid dreams    Family History  Adopted: Yes  Problem Relation Age of Onset  . Prostate cancer Father   . Cancer Father        prostate  . Heart failure Maternal Grandmother   . Heart attack Maternal Grandfather   . Colon cancer Maternal Grandfather  Prior to Admission medications   Medication Sig Start Date End Date Taking? Authorizing Provider  albuterol (PROAIR HFA) 108 (90 Base) MCG/ACT inhaler Inhale 2 puffs into the lungs every 4 (four) hours as needed. 12/05/16  Yes [provider]  aspirin EC 81 MG tablet Take 1 tablet (81 mg total) by mouth daily. 03/09/18  Yes Rehman, Mechele Dawley, MD  atorvastatin (LIPITOR) 40 MG tablet Take 1 tablet (40 mg total) by mouth daily. 07/15/19 04/22/20 Yes Herminio Commons, MD  BELBUCA 900 MCG FILM Take 1 Film by mouth 2 (two) times daily. 01/02/20  Yes [provider]  betamethasone dipropionate (DIPROLENE) 0.05 % cream Apply 1 application topically 2 (two) times daily as needed (irritation).   Yes [provider]  doxycycline (VIBRAMYCIN) 100 MG capsule Take 1 capsule (100 mg total) by mouth 2 (two) times daily for 7 days. 04/17/20 04/24/20 Yes Marcello Fennel, PA-C  DULoxetine (CYMBALTA) 60 MG capsule Take 60 mg by mouth daily.    Yes [provider]  gabapentin (NEURONTIN) 100 MG capsule Take 100 mg by mouth 3 (three) times daily.   Yes [provider]  hydrALAZINE (APRESOLINE) 25 MG tablet TAKE ONE TABLET BY MOUTH THREE TIMES DAILY 12/02/19  Yes Verta Ellen., NP  levothyroxine (SYNTHROID) 100 MCG tablet Take 100 mcg by mouth daily. 01/12/20  Yes [provider]  lidocaine-prilocaine (EMLA) cream Apply 1 application topically as needed (before dialysis).   Yes [provider]  nitroGLYCERIN (NITROSTAT) 0.4 MG SL tablet Place 1 tablet (0.4 mg total) under the tongue every 5 (five) minutes x 3 doses as needed for chest pain (If no relief after 3rd dose, proceed to the ED for an evaluation). I 04/12/18  Yes Herminio Commons, MD  ondansetron (ZOFRAN) 4 MG tablet Take 4 mg by mouth every 8 (eight) hours as needed. 04/12/20  Yes [provider]  OXYGEN Inhale 3.5 L into the lungs continuous.    Yes [provider]  pregabalin (LYRICA) 100 MG capsule Take 1 capsule by mouth in the morning, at noon, and at bedtime. 04/21/20  Yes [provider]  sucralfate (CARAFATE) 1 g tablet Take 1 g by mouth 3 (three) times daily.   Yes [provider]  traMADol (ULTRAM) 50 MG tablet Take 50 mg by mouth 2 (two) times daily as needed. 09/06/19  Yes [provider]  amoxicillin (AMOXIL) 875 MG tablet Take 875 mg by mouth 2 (two) times daily. Patient not taking: Reported on 04/22/2020 01/13/20   [provider]  carvedilol (COREG) 6.25 MG tablet Take 1 tablet by mouth in the morning and at bedtime. Patient not taking: Reported on 04/22/2020 10/13/19   [provider]  cloNIDine (CATAPRES) 0.1 MG tablet Take 0.1 mg by mouth daily. Patient not taking: Reported on 04/22/2020    [provider]  polyethylene glycol (MIRALAX / GLYCOLAX) 17 g packet Take 17 g by mouth as needed.  Patient not taking: Reported on 04/22/2020    [provider]  predniSONE (DELTASONE) 20 MG tablet Take 1 tablet (20 mg total) by mouth 2 (two) times daily. Patient not taking:  Reported on 04/22/2020 04/12/20   Daleen Bo, MD    Physical Exam: Vitals:   04/22/20 1320 04/22/20 1613 04/22/20 1945 04/22/20 2009  BP: 137/72 138/65 135/68   Pulse: 79 91 99   Resp: 19 (!) 21 (!) 22   Temp:  97.9 F (36.6 C)  TempSrc:  Oral    SpO2: 93% 95% (!) 87% 95%  Weight:      Height:        Constitutional: NAD, calm, comfortable Vitals:   04/22/20 1320 04/22/20 1613 04/22/20 1945 04/22/20 2009  BP: 137/72 138/65 135/68   Pulse: 79 91 99   Resp: 19 (!) 21 (!) 22   Temp:  97.9 F (36.6 C)    TempSrc:  Oral    SpO2: 93% 95% (!) 87% 95%  Weight:      Height:       Eyes: PERRL, lids and conjunctivae normal ENMT: Mucous membranes are moist.  Neck: normal, supple, no masses, no thyromegaly Respiratory:Normal respiratory effort. No accessory muscle use.  Cardiovascular: Regular rate and rhythm, no murmurs / rubs / gallops.  Swelling to bilateral lower extremity with pitting . 2+ pedal pulses.   Abdomen: no tenderness, no masses palpated. No hepatosplenomegaly. Bowel sounds positive.  Musculoskeletal: no clubbing / cyanosis. No joint deformity upper and lower extremities. Good ROM, no contractures. Normal muscle tone.  Skin: woody induration of the lower extremity no rashes, lesions, ulcers. No induration Neurologic: No apparent cranial abnormality, moving extremities spontaneously. Psychiatric: Normal judgment and insight. Alert and oriented x 3. Normal mood.   Labs on Admission: I have personally reviewed following labs and imaging studies  CBC: Recent Labs  Lab 04/17/20 1435 04/22/20 1454  WBC 16.7* 13.8*  NEUTROABS 16.0* 12.6*  HGB 8.8* 8.7*  HCT 30.2* 30.1*  MCV 95.0 95.9  PLT 309 761   Basic Metabolic Panel: Recent Labs  Lab 04/17/20 1435 04/22/20 1454  NA 134* 132*  K 5.0 5.9*  CL 96* 94*  CO2 28 28  GLUCOSE 240* 137*  BUN 43* 49*  CREATININE 4.23* 4.20*  CALCIUM 8.5* 8.3*  MG 1.7  --    Liver Function Tests: Recent Labs  Lab  04/17/20 1435  AST 17  ALT 22  ALKPHOS 93  BILITOT 0.5  PROT 6.6  ALBUMIN 2.4*    Radiological Exams on Admission: DG Chest Portable 1 View  Result Date: 04/22/2020 CLINICAL DATA:  Shortness of breath and weakness. Chronic renal failure EXAM: PORTABLE CHEST 1 VIEW COMPARISON:  April 17, 2020 FINDINGS: There is cardiomegaly with mild pulmonary venous hypertension. There is interstitial edema with small pleural effusions bilaterally. There is probable loculated effusion on the right laterally with atelectatic change. No airspace consolidation evident. No adenopathy. Degenerative change in each shoulder noted. IMPRESSION: Cardiomegaly with pulmonary vascular congestion. Pleural effusions and pulmonary edema. The appearance is consistent with a degree of volume overload/congestive heart failure. Suspect loculated effusion along the lateral right base with atelectatic change in this area. No consolidation. Electronically Signed   By: Lowella Grip III M.D.   On: 04/22/2020 14:54   DG Hip Port Unilat W or Wo Pelvis 1 View Left  Result Date: 04/22/2020 CLINICAL DATA:  Pain following fall EXAM: DG HIP (WITH OR WITHOUT PELVIS) 1V PORT LEFT COMPARISON:  None. FINDINGS: Frontal view left hip obtained. On frontal view, no fracture or dislocation is appreciable. There is mild narrowing of the left hip joint. No erosive change evident. There are multiple foci of arterial vascular calcification IMPRESSION: No fracture seen on frontal view. No dislocation. Mild narrowing left hip joint. Given history of trauma, correlation with lateral view may well be advisable for more precise assessment. Electronically Signed   By: Lowella Grip III M.D.   On: 04/22/2020 16:28    EKG: Independently reviewed.  Sinus rhythm rate 77, QTc 442.  T wave changes to V5 and V6-new.  T wave abnormalities to lead I aVL are old and unchanged.  Assessment/Plan Principal Problem:   Acute on chronic systolic CHF (congestive  heart failure) (HCC) Active Problems:   Abnormal ECG   HTN (hypertension)   ESRD (end stage renal disease) on dialysis [facility])   Ischemic cardiomyopathy   Left hip pain  Acute on chronic systolic and diastolic heart failure-dyspnea, chest x-ray showing pulmonary vascular congestion, lower extremity edema, per charts weights 10 days ago was 215, his weight today is 240 ?  Accuracy.  On home 3 L of O2.  Sats  . 93%. Last echo 01/2020 EF 40 to 45% with grade 2 diastolic dysfunction.. -Diuresis per HD -Strict input output, daily weights -Fluid restriction per renal diet  Left hip pain-chronic.  Reports a fall.  Limited portable x-ray negative for fracture or dislocation. -Obtain 2 view left hip x-ray -Resume home belbuca - PT eval.  Hyperkalemia-potassium 5.9. -Lokelma given in ED. -BMP a.m.  EKG abnormalities-new T wave changes in lead V5 V6.  Patient denies chest pain.  Likely due to decompensated CHF. -Trend troponin -EKG in the morning  Leukocytosis-13.8.  At this time no infectious etiology identified. - Trend  ESRD-schedule Monday Wednesday Friday.  Patient missed HD session yesterday Wednesday due to hip pain. - EDP consulted nephrology, will see in a.m.  Hypertension.-Patient not taking carvedilol and clonidine, listed on medication list -Hydralazine.  Diabetes. He is not on any chronic medications. Appears to be diet controlled.  Chronic pain syndrome.-Resume Belbuca  Hyperlipidemia. Resume statin  DVT prophylaxis: Heparin Code Status: Full code Family Communication: None at bedside. Disposition Plan: ~ 2 days Consults called: nephrology Admission status: Observation, telemetry   [place] MD [facility]  04/22/2020, 8:59 PM

## 2020-04-22 NOTE — ED Notes (Addendum)
Limb restriction band placed on the left upper extremity due to AV fistula.  Introduced self to patient and explained role in plan of care. Bed is locked in the lowest position, side rails x2, call bell within reach. Educated on hourly rounding and call light use and is in agreement at this time. Pt placed on continuous cardiac monitor, vital signs cycling q30 minutes.  Pt reports having dialysis on Monday and is requesting dialysis today and tomorrow. Reporting missing on Wednesday due to left hip pain.  Reports left hip pain has been on-going, but has worsened over the past two days, difficulty with mobility for ADLs reported.  Will continue to monitor.

## 2020-04-22 NOTE — ED Notes (Signed)
Patient transported to X-ray 

## 2020-04-22 NOTE — ED Notes (Signed)
Pt O2 sats dropping while he is sleeping. Pt advised RN that he is ordered to wear CPAP but refuses to wear it here and at home. MD notified, verbal order received to titrate O2 [place] up to aid in oxygenation during the night.

## 2020-04-22 NOTE — ED Notes (Signed)
Lab at bedside at this time.  

## 2020-04-22 NOTE — ED Notes (Signed)
Admitting provider to bedside at this time.

## 2020-04-22 NOTE — ED Notes (Signed)
While at bedside, patient educated on the need for an ambulation trial. Pt reports to this RN "Up to a few months ago, I was able to walk as far as the car, but now I can't walk at all." This RN educated that staff will assist with ambulation to wall roughly 10 feet away and patient reports "No. I'm sorry but I just can't walk." Pt is able to sit on the edge of bed and pivot with a two person assist. PA notified at this time.

## 2020-04-22 NOTE — ED Notes (Signed)
X Ray at bedside at this time.  

## 2020-04-22 NOTE — ED Triage Notes (Signed)
Pt here for admission. Pt is unable to be taken care of at home. Lives at home with wife. History of dialysis. He missed dialysis yesterday. From recent hospital stay, patient was supposed to go to assisted living this past Monday, but has not gone yet.

## 2020-04-22 NOTE — Progress Notes (Addendum)
11:31 PM RN called due to patient's O2 sat dropping into the 80s when he goes to sleep. Patient was admitted earlier this evening (11/18) due to acute on chronic systolic and diastolic heart failure, he has a history of sleep apnea and was noncompliant with CPAP at home.  CPAP was offered here, but he refused.  He was on supplemental oxygen at 3 LPM via [place], despite this, O2 sat dropped to 87% when he goes to sleep, we shall titrate O2 sat via [place] to 6 LPM and continue to monitor patient. Patient was in no acute distress at this time.  4:15 AM RN called due to patient's K+ at 6.4.  EKG showed sinus rhythm at a rate of 88 bpm with no peaked T wave.  IV insulin and bicarb given.  Calcium gluconate and Lokelma given.  Patient will have dialysis in the morning.

## 2020-04-22 NOTE — ED Notes (Signed)
Pt provided with a cup of ice water per request and approval by PA at this time.

## 2020-04-22 NOTE — TOC Initial Note (Signed)
Transition of Care [facility]) - Initial/Assessment Note   Patient Details  Name: Mark [name] MRN: [id number] Date of Birth: [birth year]  [facility]) CM/SW Contact:    Sherie Don, LCSW Phone Number: 04/22/2020, 2:54 PM  Clinical Narrative: Patient is a 64 year old male who presented to the ED after missing dialysis yesterday and family being unable to care for him at home. TOC consulted regarding family wanting to place patient in an ALF, but this did not happen earlier this week.  CSW spoke with patient's wife, Dayson Aboud, regarding the ALF. Per wife, patient's PCP was assisting with placement, but told the family the patient will need a 3 midnight stay at the hospital prior to placement. CSW explained ALF vs. SNF and wife stated the patient will need SNF as he cannot care for himself and is missing dialysis frequently because the family cannot physically assist the patient. Wife reported patient is currently active with hospice services, "but they're not doing much for him." CSW explained patient will need to discharge from hospice services if SNF is pursued. Wife agreeable to discharging patient from hospice.  PASRR number received and FL2 started. TOC awaiting PT evaluation.  Expected Discharge Plan: Skilled Nursing Facility Barriers to Discharge: ED Patient Insisting on an Alternate Living Situation/Facility  Patient Goals and CMS Choice Patient states their goals for this hospitalization and ongoing recovery are:: SNF for rehab CMS Medicare.gov Compare Post Acute Care list provided to:: Patient Represenative (must comment) ([name] Dunklee (wife)) Choice offered to / list presented to : Spouse  Expected Discharge Plan and Services Expected Discharge Plan: [place] In-house Referral: Clinical Social Work Discharge Planning Services: NA Post Acute Care Choice: [place] Living arrangements for the past 2 months: Single Family Home              DME Arranged: N/A DME Agency: NA HH Arranged: NA [place] Agency: NA  Prior Living Arrangements/Services Living arrangements for the past 2 months: [place] Lives with:: Spouse Patient language and need for interpreter reviewed:: Yes Do you feel safe going back to the place where you live?: No   Patient and family cannot care for him at home  Need for Family Participation in Patient Care: Yes (Comment) Care giver support system in place?: Yes (comment) Current home services: Hospice Criminal Activity/Legal Involvement Pertinent to Current Situation/Hospitalization: No - Comment as needed  Permission Sought/Granted Permission sought to share information with : Chartered certified accountant granted to share info w AGENCY: SNFs  Emotional Assessment Appearance:: Appears stated age Orientation: : Oriented to Self, Oriented to Place, Oriented to  Time, Oriented to Situation Alcohol / Substance Use: Not Applicable Psych Involvement: No (comment)  Admission diagnosis:  ems Patient Active Problem List   Diagnosis Date Noted  . Acute on chronic systolic CHF (congestive heart failure) ([place]) 01/10/2020  . Volume overload 01/10/2020  . Hypoxia 01/09/2020  . Positive colorectal cancer screening using Cologuard test 02/11/2018  . Ischemic cardiomyopathy   . Bacteremia   . Anxiety state 04/06/2016  . Diabetes mellitus type 2 in obese ([place]) 04/06/2016  . Bacteremia associated with intravascular line ([place]) 04/04/2016  . Abnormal stress test 03/25/2015  . Abnormal nuclear stress test   . Cardiomyopathy ([place])   . Chronic systolic heart failure ([place])   . ESRD (end stage renal disease) on dialysis ([place])   . Pre-operative cardiovascular examination   . Positive cardiac stress test 03/24/2015  . Essential hypertension   .  Acute respiratory failure ([place]) 02/12/2015  . Renal failure   . Hyperkalemia   . Chronic kidney disease (CKD), stage IV (severe) ([place]) 01/22/2015  .  HNP (herniated nucleus pulposus), lumbar 02/13/2014  . Abnormal ECG 02/12/2014  . HTN (hypertension) 02/12/2014  . Bradycardia 02/12/2014   PCP:  Leeanne Rio, MD Pharmacy:   [facility], [place] [address] Phone: [phone] Fax: [phone]  [facility] [place], [place] [address] [place] [address] Phone: [phone] Fax: [phone]  Readmission Risk Interventions No flowsheet data found.

## 2020-04-22 NOTE — ED Notes (Signed)
Pt provided with a warm blanket per request

## 2020-04-22 NOTE — ED Notes (Signed)
Pt repositioned in bed with a pillow under bilateral lower extremities. Offer for turning and other repositioning, but patient refused at this time. Educated to call for staff if needed, and verbalized understanding at this time. Pt appears to be resting in bed with no signs of acute distress noted. Will continue to monitor at this time.

## 2020-04-22 NOTE — ED Provider Notes (Signed)
[facility] EMERGENCY DEPARTMENT Provider Note   CSN: [id number] Arrival date & time: 04/22/20  1131     History Chief Complaint  Patient presents with  . Admission    Mark [name] is a 64 y.o. male.  HPI      Mark [name] is a 64 y.o. male with past medical history of CHF, chronic kidney disease, (currently hemodialyzed on Monday Wednesday Friday) diabetes, hypertension and sleep apnea.  He is chronically on 3L oxygen at home.  He presents to the Emergency Department complaining of left hip pain and requesting dialysis.  He states that he has chronic pain of his left hip.  He was seen by his orthopedic provider yesterday and suffered an accidental fall when he returned home.  [place] while trying to get in the front door.  Uses a walker at baseline.  states he fell over his walker and landed on his left hip.  Unable to bear weight or walk since fall. EMS was called but patient refused transport to the hospital yesterday.  He subsequently missed his dialysis yesterday.  He is requesting to be dialyzed today.  He denies chest pain or shortness of breath, fever, cough or chills.  No head injury or LOC, headache or dizziness.  He also states that he waiting for approval to go to assisted living, but has not heard from his PCP about this.    Past Medical History:  Diagnosis Date  . Anxiety    occas. panic attack, takes xanax occas  . Arthritis    herniated disc, lumbar   . Back pain   . CAD (coronary artery disease)    03/25/2015 95% prox to mid LAD treated with Promus Premier DES 3.5x57mm postdilated to 4.63mm, 100% prox to distal RCA lesion filled in the PDA region via collaterals from LAD and PLA  . Cancer ([place])    - skin ca on face- removed   . CHF (congestive heart failure) ([place])   . Chronic kidney disease   . Complication of anesthesia    " it takes a long time to get over it."   . Diabetes mellitus without complication ([place])    type 2  . Dialysis patient ([place])   .  Fibromyalgia   . GERD (gastroesophageal reflux disease)    otc- pepcid , approx. every other  day    . H/O acute respiratory failure   . Hematuria    being followed by Dr. Hinda Lenis for decreased kidney function   . History of blood product transfusion 02/13/2014   After having back surgery  . Hypercholesteremia   . Hypertension   . Myocardial infarction ([place]) 03/2015  . Pneumonia    "walking"  . Shortness of breath dyspnea   . Sleep apnea    test aborted, due to not able to relax , since the aborted test he had surgery for gallbladder & he reports that he was told that he has sleep apnea     Patient Active Problem List   Diagnosis Date Noted  . Acute on chronic systolic CHF (congestive heart failure) ([place]) 01/10/2020  . Volume overload 01/10/2020  . Hypoxia 01/09/2020  . Positive colorectal cancer screening using Cologuard test 02/11/2018  . Ischemic cardiomyopathy   . Bacteremia   . Anxiety state 04/06/2016  . Diabetes mellitus type 2 in obese ([place]) 04/06/2016  . Bacteremia associated with intravascular line ([place]) 04/04/2016  . Abnormal stress test 03/25/2015  . Abnormal nuclear stress test   .  Cardiomyopathy ([place])   . Chronic systolic heart failure ([place])   . ESRD (end stage renal disease) on dialysis ([place])   . Pre-operative cardiovascular examination   . Positive cardiac stress test 03/24/2015  . Essential hypertension   . Acute respiratory failure ([place]) 02/12/2015  . Renal failure   . Hyperkalemia   . Chronic kidney disease (CKD), stage IV (severe) ([place]) 01/22/2015  . HNP (herniated nucleus pulposus), lumbar 02/13/2014  . Abnormal ECG 02/12/2014  . HTN (hypertension) 02/12/2014  . Bradycardia 02/12/2014    Past Surgical History:  Procedure Laterality Date  . A/V FISTULAGRAM Left 10/31/2016   Procedure: A/V Fistulagram;  Surgeon: Serafina Mitchell, MD;  Location: [place] [facility];  Service: Cardiovascular;  Laterality: Left;  . AV FISTULA PLACEMENT Left 02/02/2015     Procedure: LEFT ARM RADIOCEPHALIC ARTERIOVENOUS (AV) FISTULA CREATION;  Surgeon: Conrad Haworth, MD;  Location: [place];  Service: Vascular;  Laterality: Left;  . AV FISTULA PLACEMENT Left 05/25/2016   Procedure: ARTERIOVENOUS (AV) FISTULA CREATION;  Surgeon: [name], MD;  Location: [facility];  Service: Vascular;  Laterality: Left;  . BACK SURGERY  02/13/14  . BIOPSY  03/08/2018   Procedure: BIOPSY;  Surgeon: Rogene Houston, MD;  Location: [facility];  Service: Endoscopy;;  gastric  . CARDIAC CATHETERIZATION N/A 03/25/2015   Procedure: Left Heart Cath and Coronary Angiography;  Surgeon: Leonie Man, MD;  Location: [place] [facility];  Service: Cardiovascular;  Laterality: N/A;  . CARDIAC CATHETERIZATION N/A 03/25/2015   Procedure: Coronary Stent Intervention;  Surgeon: Leonie Man, MD;  Location: [facility];  Service: Cardiovascular;  Laterality: N/A;  . CHOLECYSTECTOMY    . COLONOSCOPY    . COLONOSCOPY WITH PROPOFOL N/A 03/08/2018   Procedure: COLONOSCOPY WITH PROPOFOL;  Surgeon: Rogene Houston, MD;  Location[facility];  Service: Endoscopy;  Laterality: N/A;  11:10  . EYE SURGERY     cataracts remove, bilateral, w/IOL.  Marland Kitchen INSERTION OF DIALYSIS CATHETER N/A 03/09/2015   Procedure: INSERTION OF DIALYSIS CATHETER;  Surgeon: Conrad M[place] Corner, MD;  Location: [place];  Service: Vascular;  Laterality: N/A;  . INSERTION OF DIALYSIS CATHETER Left 04/07/2016   Procedure: INSERTION OF TUNNELED DIALYSIS CATHETER LEFT INTERNAL JUGULAR, ATTEMPTED INSERTION OF TUNNELLED CATHETER RIGHT INTERNAL JUGULAR;  Surgeon: [name], MD;  Location: [facility];  Service: Vascular;  Laterality: Left;  . IR GENERIC HISTORICAL  07/28/2016   IR FLUORO GUIDE CV LINE LEFT 07/28/2016 Greggory Keen, MD [facility]-INTERV RAD  . IR REMOVAL TUN CV CATH W/O FL  03/28/2017  . LUMBAR LAMINECTOMY/DECOMPRESSION MICRODISCECTOMY Right 02/13/2014   Procedure: RIGHT LUMBAR FOUR-FIVE, RIGHT LUMBAR FIVE- SACRAL ONE LUMBAR  LAMINECTOMY/DECOMPRESSION MICRODISCECTOMY ;  Surgeon: Ashok Pall, MD;  Location: [facility];  Service: Neurosurgery;  Laterality: Right;  Right L4-5 and Right L5-S1 diskectomies  . PERIPHERAL VASCULAR BALLOON ANGIOPLASTY Left 10/31/2016   Procedure: Peripheral Vascular Balloon Angioplasty;  Surgeon: Serafina Mitchell, MD;  Location: [facility];  Service: Cardiovascular;  Laterality: Left;  AV fistula  . PERIPHERAL VASCULAR BALLOON ANGIOPLASTY Left 01/28/2019   Procedure: PERIPHERAL VASCULAR BALLOON ANGIOPLASTY;  Surgeon: Serafina Mitchell, MD;  Location: [facility] [facility];  Service: Cardiovascular;  Laterality: Left;  arm fistula  . TEE WITHOUT CARDIOVERSION N/A 05/02/2016   Procedure: TRANSESOPHAGEAL ECHOCARDIOGRAM (TEE);  Surgeon: Herminio Commons, MD;  [facility];  Service: Cardiovascular;  Laterality: N/A;  . TONSILLECTOMY  Family History  Adopted: Yes  Problem Relation Age of Onset  . Prostate cancer Father   . Cancer Father        prostate  . Heart failure Maternal Grandmother   . Heart attack Maternal Grandfather   . Colon cancer Maternal Grandfather     Social History   Tobacco Use  . Smoking status: Current Every Day Smoker    Packs/day: 0.50    Years: 40.00    Pack years: 20.00    Types: Cigarettes    Start date: 10/17/1973    Last attempt to quit: 02/07/2015    Years since quitting: 5.2  . Smokeless tobacco: Never Used  Vaping Use  . Vaping Use: Never used  Substance Use Topics  . Alcohol use: No    Alcohol/week: 0.0 standard drinks  . Drug use: No    Home Medications Prior to Admission medications   Medication Sig Start Date End Date Taking? Authorizing Provider  albuterol (PROAIR HFA) 108 (90 Base) MCG/ACT inhaler Inhale 2 puffs into the lungs every 4 (four) hours as needed. 12/05/16  Yes [provider]  aspirin EC 81 MG tablet Take 1 tablet (81 mg total) by mouth daily. 03/09/18  Yes Rehman, Mechele Dawley, MD  atorvastatin  (LIPITOR) 40 MG tablet Take 1 tablet (40 mg total) by mouth daily. 07/15/19 04/22/20 Yes Herminio Commons, MD  BELBUCA 900 MCG FILM Take 1 Film by mouth 2 (two) times daily. 01/02/20  Yes [provider]  betamethasone dipropionate (DIPROLENE) 0.05 % cream Apply 1 application topically 2 (two) times daily as needed (irritation).   Yes [provider]  doxycycline (VIBRAMYCIN) 100 MG capsule Take 1 capsule (100 mg total) by mouth 2 (two) times daily for 7 days. 04/17/20 04/24/20 Yes Marcello Fennel, PA-C  DULoxetine (CYMBALTA) 60 MG capsule Take 60 mg by mouth daily.    Yes [provider]  gabapentin (NEURONTIN) 100 MG capsule Take 100 mg by mouth 3 (three) times daily.   Yes [provider]  hydrALAZINE (APRESOLINE) 25 MG tablet TAKE ONE TABLET BY MOUTH THREE TIMES DAILY 12/02/19  Yes Verta Ellen., NP  levothyroxine (SYNTHROID) 100 MCG tablet Take 100 mcg by mouth daily. 01/12/20  Yes [provider]  lidocaine-prilocaine (EMLA) cream Apply 1 application topically as needed (before dialysis).   Yes [provider]  nitroGLYCERIN (NITROSTAT) 0.4 MG SL tablet Place 1 tablet (0.4 mg total) under the tongue every 5 (five) minutes x 3 doses as needed for chest pain (If no relief after 3rd dose, proceed to the ED for an evaluation). I 04/12/18  Yes Herminio Commons, MD  ondansetron (ZOFRAN) 4 MG tablet Take 4 mg by mouth every 8 (eight) hours as needed. 04/12/20  Yes [provider]  OXYGEN Inhale 3.5 L into the lungs continuous.    Yes [provider]  pregabalin (LYRICA) 100 MG capsule Take 1 capsule by mouth in the morning, at noon, and at bedtime. 04/21/20  Yes [provider]  sucralfate (CARAFATE) 1 g tablet Take 1 g by mouth 3 (three) times daily.   Yes [provider]  traMADol (ULTRAM) 50 MG tablet Take 50 mg by mouth 2 (two) times daily as needed. 09/06/19  Yes [provider]  amoxicillin  (AMOXIL) 875 MG tablet Take 875 mg by mouth 2 (two) times daily. Patient not taking: Reported on 04/22/2020 01/13/20   [provider]  carvedilol (COREG) 6.25 MG tablet Take 1  tablet by mouth in the morning and at bedtime. Patient not taking: Reported on 04/22/2020 10/13/19   [provider]  cloNIDine (CATAPRES) 0.1 MG tablet Take 0.1 mg by mouth daily. Patient not taking: Reported on 04/22/2020    [provider]  polyethylene glycol (MIRALAX / GLYCOLAX) 17 g packet Take 17 g by mouth as needed.  Patient not taking: Reported on 04/22/2020    [provider]  predniSONE (DELTASONE) 20 MG tablet Take 1 tablet (20 mg total) by mouth 2 (two) times daily. Patient not taking: Reported on 04/22/2020 04/12/20   Daleen Bo, MD    Allergies    Morphine and related and Varenicline  Review of Systems   Review of Systems  Constitutional: Negative for chills and fever.  Respiratory: Negative for cough and shortness of breath.   Cardiovascular: Positive for leg swelling. Negative for chest pain.  Gastrointestinal: Negative for abdominal pain, diarrhea, nausea and vomiting.  Genitourinary: Negative for difficulty urinating and dysuria.  Musculoskeletal: Positive for arthralgias (left hip pain). Negative for joint swelling and neck pain.  Skin: Negative for color change and wound.  Neurological: Negative for dizziness, seizures, syncope, weakness and headaches.  Psychiatric/Behavioral: Negative for confusion.    Physical Exam Updated Vital Signs BP 137/72 (BP Location: Right Arm)   Pulse 79   Temp 97.6 F (36.4 C) (Oral)   Resp 19   Ht 5\' 11"  (1.803 m)   Wt 109 kg   SpO2 93%   BMI 33.52 kg/m   Physical Exam Vitals and nursing note reviewed.  Constitutional:      General: He is not in acute distress.    Appearance: He is ill-appearing.  HENT:     Mouth/Throat:     Mouth: Mucous membranes are moist.  Eyes:     Conjunctiva/sclera: Conjunctivae  normal.  Cardiovascular:     Rate and Rhythm: Normal rate and regular rhythm.     Pulses: Normal pulses.  Pulmonary:     Effort: Pulmonary effort is normal.     Comments: Mild rales throughout.  Patient able to speak in complete sentences without increased work of breathing.  Currently on 3 L O2 by nasal cannula Abdominal:     General: There is no distension.     Palpations: Abdomen is soft.     Tenderness: There is no abdominal tenderness.  Musculoskeletal:        General: Tenderness and signs of injury present.     Cervical back: Normal range of motion.     Right lower leg: Edema present.     Left lower leg: Edema present.     Comments: Patient has tenderness palpation of the anterior and lateral left hip on range of motion.  Chronic appearing lymphedema of the bilateral lower extremities.  No open wounds or erythema.  No excessive warmth.  Skin:    Capillary Refill: Capillary refill takes less than 2 seconds.  Neurological:     General: No focal deficit present.     Mental Status: He is alert.     Sensory: Sensation is intact. No sensory deficit.     Motor: No weakness.     Comments: CN II-XII grossly intact.  Speech clear.       ED Results / Procedures / Treatments   Labs (all labs ordered are listed, but only abnormal results are displayed) Labs Reviewed  BASIC METABOLIC PANEL - Abnormal; Notable for the following components:      Result Value  Sodium 132 (*)    Potassium 5.9 (*)    Chloride 94 (*)    Glucose, Bld 137 (*)    BUN 49 (*)    Creatinine, Ser 4.20 (*)    Calcium 8.3 (*)    GFR, Estimated 15 (*)    All other components within normal limits  CBC WITH DIFFERENTIAL/PLATELET - Abnormal; Notable for the following components:   WBC 13.8 (*)    RBC 3.14 (*)    Hemoglobin 8.7 (*)    HCT 30.1 (*)    MCHC 28.9 (*)    RDW 17.0 (*)    Neutro Abs 12.6 (*)    Lymphs Abs 0.3 (*)    Abs Immature Granulocytes 0.09 (*)    All other components within normal limits    TROPONIN I (HIGH SENSITIVITY) - Abnormal; Notable for the following components:   Troponin I (High Sensitivity) 50 (*)    All other components within normal limits  RESP PANEL BY RT-PCR (FLU A&B, COVID) [id number]  BASIC METABOLIC PANEL  CBC    EKG None  Radiology DG Chest Portable 1 View  Result Date: 04/22/2020 CLINICAL DATA:  Shortness of breath and weakness. Chronic renal failure EXAM: PORTABLE CHEST 1 VIEW COMPARISON:  April 17, 2020 FINDINGS: There is cardiomegaly with mild pulmonary venous hypertension. There is interstitial edema with small pleural effusions bilaterally. There is probable loculated effusion on the right laterally with atelectatic change. No airspace consolidation evident. No adenopathy. Degenerative change in each shoulder noted. IMPRESSION: Cardiomegaly with pulmonary vascular congestion. Pleural effusions and pulmonary edema. The appearance is consistent with a degree of volume overload/congestive heart failure. Suspect loculated effusion along the lateral right base with atelectatic change in this area. No consolidation. Electronically Signed   By: Lowella Grip III M.D.   On: 04/22/2020 14:54   DG Hip Port Unilat W or Wo Pelvis 1 View Left  Result Date: 04/22/2020 CLINICAL DATA:  Pain following fall EXAM: DG HIP (WITH OR WITHOUT PELVIS) 1V PORT LEFT COMPARISON:  None. FINDINGS: Frontal view left hip obtained. On frontal view, no fracture or dislocation is appreciable. There is mild narrowing of the left hip joint. No erosive change evident. There are multiple foci of arterial vascular calcification IMPRESSION: No fracture seen on frontal view. No dislocation. Mild narrowing left hip joint. Given history of trauma, correlation with lateral view may well be advisable for more precise assessment. Electronically Signed   By: Lowella Grip III M.D.   On: 04/22/2020 16:28    Procedures Procedures (including critical care time)  Medications Ordered in  ED Medications  sodium zirconium cyclosilicate (LOKELMA) packet 10 g (has no administration in time range)    ED Course  I have reviewed the triage vital signs and the nursing notes.  Pertinent labs & imaging results that were available during my care of the patient were reviewed by me and considered in my medical decision making (see chart for details).  Clinical Course as of Apr 23 2323  Thu Apr 22, 2020  2323 EKG 12-Lead [TT]    Clinical Course User Index [TT] Kem Parkinson, PA-C   MDM Rules/Calculators/A&P                          I spoke with social worker, Tobi Bastos.  Patient is currently under hospice care and family was requesting placement into a skilled facility.  This was not relayed to me by the patient.  On  review on medical record, it appears that his PCP has been trying to get placement, but having issues with insurance.    Patient scheduled for dialysis yesterday missed his appointment due to a mechanical fall.  Here today for persistent hip pain and requesting dialysis again.  Overall, he does not appear in acute distress. He does have some LE edema which appears chronic.   His O2 saturation is in the mid 90's on 3L O2 which appears to be his baseline.  He is having significant pain to the left hip and unable to stand or walk.  Portable XR obtained and w/o evidence of acute bony process.  Labs show hyperkalemia, kidney functions appear baseline. Mild leukocytosis, improved from 5 days ago.  hgb 8.7, stable from previous.  EKG w/o acute ischemic change.  CXR shows likely volume overload.  Since pt is continuing to have left hip pain and unable to walk, I will consult for admission.  I will also consult nephrology regarding need for emergent dialysis today although I doubt this necessary   [place] nephrology, Dr. Posey Pronto and discussed findings.  Recommends Lokelma and will see tomorrow for dialysis if admitted.   [place] hospitalist and discussed findings,  she agrees to admit.     Final Clinical Impression(s) / ED Diagnoses Final diagnoses:  Left hip pain  Hyperkalemia    Rx / DC Orders ED Discharge Orders    None       Kem Parkinson, PA-C 04/22/20 2331    Milton Ferguson, MD 04/23/20 [phone]

## 2020-04-23 DIAGNOSIS — Z515 Encounter for palliative care: Secondary | ICD-10-CM | POA: Diagnosis not present

## 2020-04-23 DIAGNOSIS — E785 Hyperlipidemia, unspecified: Secondary | ICD-10-CM | POA: Diagnosis present

## 2020-04-23 DIAGNOSIS — E875 Hyperkalemia: Secondary | ICD-10-CM | POA: Diagnosis present

## 2020-04-23 DIAGNOSIS — Z9981 Dependence on supplemental oxygen: Secondary | ICD-10-CM | POA: Diagnosis not present

## 2020-04-23 DIAGNOSIS — F410 Panic disorder [episodic paroxysmal anxiety] without agoraphobia: Secondary | ICD-10-CM | POA: Diagnosis present

## 2020-04-23 DIAGNOSIS — Z79899 Other long term (current) drug therapy: Secondary | ICD-10-CM | POA: Diagnosis not present

## 2020-04-23 DIAGNOSIS — F17210 Nicotine dependence, cigarettes, uncomplicated: Secondary | ICD-10-CM | POA: Diagnosis present

## 2020-04-23 DIAGNOSIS — Z7189 Other specified counseling: Secondary | ICD-10-CM | POA: Diagnosis not present

## 2020-04-23 DIAGNOSIS — Z20822 Contact with and (suspected) exposure to covid-19: Secondary | ICD-10-CM | POA: Diagnosis present

## 2020-04-23 DIAGNOSIS — M797 Fibromyalgia: Secondary | ICD-10-CM | POA: Diagnosis present

## 2020-04-23 DIAGNOSIS — I255 Ischemic cardiomyopathy: Secondary | ICD-10-CM | POA: Diagnosis present

## 2020-04-23 DIAGNOSIS — I5023 Acute on chronic systolic (congestive) heart failure: Secondary | ICD-10-CM | POA: Diagnosis not present

## 2020-04-23 DIAGNOSIS — N186 End stage renal disease: Secondary | ICD-10-CM | POA: Diagnosis present

## 2020-04-23 DIAGNOSIS — E7800 Pure hypercholesterolemia, unspecified: Secondary | ICD-10-CM | POA: Diagnosis present

## 2020-04-23 DIAGNOSIS — K219 Gastro-esophageal reflux disease without esophagitis: Secondary | ICD-10-CM | POA: Diagnosis present

## 2020-04-23 DIAGNOSIS — Z9119 Patient's noncompliance with other medical treatment and regimen: Secondary | ICD-10-CM | POA: Diagnosis not present

## 2020-04-23 DIAGNOSIS — N2581 Secondary hyperparathyroidism of renal origin: Secondary | ICD-10-CM | POA: Diagnosis present

## 2020-04-23 DIAGNOSIS — J9621 Acute and chronic respiratory failure with hypoxia: Secondary | ICD-10-CM | POA: Diagnosis present

## 2020-04-23 DIAGNOSIS — I252 Old myocardial infarction: Secondary | ICD-10-CM | POA: Diagnosis not present

## 2020-04-23 DIAGNOSIS — G894 Chronic pain syndrome: Secondary | ICD-10-CM | POA: Diagnosis present

## 2020-04-23 DIAGNOSIS — I5043 Acute on chronic combined systolic (congestive) and diastolic (congestive) heart failure: Secondary | ICD-10-CM | POA: Diagnosis present

## 2020-04-23 DIAGNOSIS — Z79890 Hormone replacement therapy (postmenopausal): Secondary | ICD-10-CM | POA: Diagnosis not present

## 2020-04-23 DIAGNOSIS — Z66 Do not resuscitate: Secondary | ICD-10-CM | POA: Diagnosis present

## 2020-04-23 DIAGNOSIS — I2510 Atherosclerotic heart disease of native coronary artery without angina pectoris: Secondary | ICD-10-CM | POA: Diagnosis present

## 2020-04-23 DIAGNOSIS — Z7982 Long term (current) use of aspirin: Secondary | ICD-10-CM | POA: Diagnosis not present

## 2020-04-23 DIAGNOSIS — Z992 Dependence on renal dialysis: Secondary | ICD-10-CM | POA: Diagnosis not present

## 2020-04-23 DIAGNOSIS — I132 Hypertensive heart and chronic kidney disease with heart failure and with stage 5 chronic kidney disease, or end stage renal disease: Secondary | ICD-10-CM | POA: Diagnosis present

## 2020-04-23 LAB — BASIC METABOLIC PANEL
Anion gap: 10 (ref 5–15)
BUN: 51 mg/dL — ABNORMAL HIGH (ref 8–23)
CO2: 27 mmol/L (ref 22–32)
Calcium: 8.4 mg/dL — ABNORMAL LOW (ref 8.9–10.3)
Chloride: 96 mmol/L — ABNORMAL LOW (ref 98–111)
Creatinine, Ser: 4.41 mg/dL — ABNORMAL HIGH (ref 0.61–1.24)
GFR, Estimated: 14 mL/min — ABNORMAL LOW (ref >60)
Glucose, Bld: 152 mg/dL — ABNORMAL HIGH (ref 70–99)
Potassium: 6.4 mmol/L (ref 3.5–5.1)
Sodium: 133 mmol/L — ABNORMAL LOW (ref 135–145)

## 2020-04-23 LAB — CBC
HCT: 29.4 % — ABNORMAL LOW (ref 39.0–52.0)
Hemoglobin: 8.6 g/dL — ABNORMAL LOW (ref 13.0–17.0)
MCH: 27.4 pg (ref 26.0–34.0)
MCHC: 29.3 g/dL — ABNORMAL LOW (ref 30.0–36.0)
MCV: 93.6 fL (ref 80.0–100.0)
Platelets: 246 10*3/uL (ref 150–400)
RBC: 3.14 MIL/uL — ABNORMAL LOW (ref 4.22–5.81)
RDW: 17.2 % — ABNORMAL HIGH (ref 11.5–15.5)
WBC: 14.2 10*3/uL — ABNORMAL HIGH (ref 4.0–10.5)
nRBC: 0 % (ref 0.0–0.2)

## 2020-04-23 LAB — GLUCOSE, CAPILLARY: Glucose-Capillary: 141 mg/dL — ABNORMAL HIGH (ref 70–99)

## 2020-04-23 LAB — HEPATITIS B SURFACE ANTIGEN: Hepatitis B Surface Ag: NONREACTIVE

## 2020-04-23 LAB — POTASSIUM: Potassium: 6.3 mmol/L (ref 3.5–5.1)

## 2020-04-23 MED ORDER — LIDOCAINE HCL (PF) 1 % IJ SOLN: 5.0000 mL | INTRAMUSCULAR | Status: DC | PRN

## 2020-04-23 MED ORDER — SODIUM CHLORIDE 0.9 % IV BOLUS: 500.0000 mL | Freq: Once | INTRAVENOUS | Status: DC

## 2020-04-23 MED ORDER — TRAMADOL HCL 50 MG PO TABS: 50.0000 mg | ORAL_TABLET | Freq: Two times a day (BID) | ORAL | Status: DC | PRN

## 2020-04-23 MED ORDER — LIDOCAINE-PRILOCAINE 2.5-2.5 % EX CREA: 1.0000 "application " | TOPICAL_CREAM | CUTANEOUS | Status: DC | PRN

## 2020-04-23 MED ORDER — ALBUMIN HUMAN 25 % IV SOLN
25.0000 g | INTRAVENOUS | Status: DC | PRN
  Administered 2020-04-24: 25 g via INTRAVENOUS

## 2020-04-23 MED ORDER — BUPRENORPHINE HCL-NALOXONE HCL 2-0.5 MG SL SUBL
2.0000 | SUBLINGUAL_TABLET | Freq: Once | SUBLINGUAL | Status: AC
  Administered 2020-04-23: 2 via SUBLINGUAL
  Filled 2020-04-23: qty 2

## 2020-04-23 MED ORDER — CALCIUM GLUCONATE-NACL 1-0.675 GM/50ML-% IV SOLN: 1.0000 g | Freq: Once | INTRAVENOUS | Status: DC

## 2020-04-23 MED ORDER — PENTAFLUOROPROP-TETRAFLUOROETH EX AERO: 1.0000 "application " | INHALATION_SPRAY | CUTANEOUS | Status: DC | PRN

## 2020-04-23 MED ORDER — BUPRENORPHINE HCL-NALOXONE HCL 2-0.5 MG SL SUBL: 2.0000 | SUBLINGUAL_TABLET | Freq: Every day | SUBLINGUAL | Status: DC

## 2020-04-23 MED ORDER — CHLORHEXIDINE GLUCONATE CLOTH 2 % EX PADS: 6.0000 | MEDICATED_PAD | Freq: Every day | CUTANEOUS | Status: DC

## 2020-04-23 MED ORDER — SODIUM ZIRCONIUM CYCLOSILICATE 5 G PO PACK: 10.0000 g | PACK | Freq: Once | ORAL | Status: DC

## 2020-04-23 MED ORDER — SODIUM CHLORIDE 0.9 % IV SOLN: 100.0000 mL | INTRAVENOUS | Status: DC | PRN

## 2020-04-23 MED ORDER — CALCIUM GLUCONATE-NACL 1-0.675 GM/50ML-% IV SOLN
1.0000 g | Freq: Once | INTRAVENOUS | Status: AC
  Administered 2020-04-23: 1000 mg via INTRAVENOUS

## 2020-04-23 MED ORDER — INSULIN ASPART 100 UNIT/ML ~~LOC~~ SOLN: 0.0000 [IU] | Freq: Three times a day (TID) | SUBCUTANEOUS | Status: DC

## 2020-04-23 MED ORDER — INSULIN ASPART 100 UNIT/ML ~~LOC~~ SOLN: 0.0000 [IU] | Freq: Every day | SUBCUTANEOUS | Status: DC

## 2020-04-23 NOTE — BH Assessment (Signed)
Comprehensive Clinical Assessment (CCA) Screening, Triage and Referral Note  04/23/2020 Mark [name] [id number] Patient presents this date with ongoing depression. Patient denies any S/I, H/I or AVH. Patient reports that he has dealt with depression "on and off" since his health has been declining over the last few years. Patient reports multiple health issues to include back issues, renal failure, coronary artery disease and CHF. Patient denies any prior attempts or gestures at self harm. Patient denies any prior psychiatric history beyond depression. Patient denies any prior inpatient admissions associated with mental health. Patient is observed to be very drowsy and renders limited history and finds it difficult to stay awake. Patient answers mostly "yes and no" to questions associated with assessment. Patient would not respond to some of the assessment questions. Patient states his PCP has diagnosed him with depression and he has been on medications in the past for symptom management that were prescribed by that PCP. Patient is unsure of the time frame or what those medications were. Patient is unsure when he last was prescribed medications for symptom management. Patient is vague this date in reference to symptoms just stating, "he is depressed." Patient denies any current SA issues. Patient reports he is not interested in receiving any services related to psychiatry. Patient reports he is unsure if he is interested in any medications for depressive symptoms. Patient states he "already is on a lot of medicines." Patient states his PCP can assist if medications are needed for depression. Patient renders limited history this date. Per chart review patient has a limited psychiatric history. Patient is currently contracting for safety.   Patient is oriented and speaks in a low soft voice. Patient denies any S/I, H/I or AVH. Patient provides limited history during assessment and finds it difficult to stay  awake. Patient's thoughts are organized although only answers "yes and no" to most questions. Patient does not appear to be responding to internal stimuli. Case was staffed with [place] NP who recommended patient be cleared by psychiatry and does not meet inpatient criteria.            Chief Complaint:  Chief Complaint  Patient presents with  . Admission   Visit Diagnosis: MDD recurrent without psychotic features, severe   Patient Reported Information How did you hear about [place]? Self   Referral name: No data recorded  Referral phone number: No data recorded Whom do you see for routine medical problems? I don't have a doctor   Practice/Facility Name: No data recorded  Practice/Facility Phone Number: No data recorded  Name of Contact: No data recorded  Contact Number: No data recorded  Contact Fax Number: No data recorded  Prescriber Name: No data recorded  Prescriber Address (if known): No data recorded What Is the Reason for Your Visit/Call Today? Consult put in for depression  How Long Has This Been Causing You Problems? 1 wk - 1 month  Have You Recently Been in Any Inpatient Treatment (Hospital/Detox/Crisis Center/28-Day Program)? No   Name/Location of Program/Hospital:No data recorded  How Long Were You There? No data recorded  When Were You Discharged? No data recorded Have You Ever Received Services From [facility] Before? No   Who Do You See at [facility]? No data recorded Have You Recently Had Any Thoughts About Hurting Yourself? No   Are You Planning to Commit Suicide/Harm Yourself At This time?  No  Have you Recently Had Thoughts About [place]? No   Explanation: No data recorded Have You  Used Any Alcohol or Drugs in the Past 24 Hours? No   How Long Ago Did You Use Drugs or Alcohol?  No data recorded  What Did You Use and How Much? No data recorded What Do You Feel Would Help You the Most Today? No data recorded Do You Currently Have a  Therapist/Psychiatrist? No   Name of Therapist/Psychiatrist: No data recorded  Have You Been Recently Discharged From Any Office Practice or Programs? No   Explanation of Discharge From Practice/Program:  No data recorded    CCA Screening Triage Referral Assessment Type of Contact: Tele-Assessment   Is this Initial or Reassessment? Initial Assessment   Date Telepsych consult ordered in [facility]:  04/23/20   Time Telepsych consult ordered in [facility]:  No data recorded Patient Reported Information Reviewed? Yes   Patient Left Without Being Seen? No data recorded  Reason for Not Completing Assessment: No data recorded Collateral Involvement: No data recorded Does Patient Have a [place]? No data recorded  Name and Contact of Legal Guardian:  No data recorded If Minor and Not Living with Parent(s), Who has Custody? No data recorded Is CPS involved or ever been involved? Never  Is APS involved or ever been involved? Never  Patient Determined To Be At Risk for Harm To Self or Others Based on Review of Patient Reported Information or Presenting Complaint? No   Method: No data recorded  Availability of Means: No data recorded  Intent: No data recorded  Notification Required: No data recorded  Additional Information for Danger to Others Potential:  No data recorded  Additional Comments for Danger to Others Potential:  No data recorded  Are There Guns or Other Weapons in Your Home?  No data recorded   Types of Guns/Weapons: No data recorded   Are These Weapons Safely Secured?                              No data recorded   Who Could Verify You Are Able To Have These Secured:    No data recorded Do You Have any Outstanding Charges, Pending Court Dates, Parole/Probation? No data recorded Contacted To Inform of Risk of Harm To Self or Others: No data recorded Location of Assessment: [facility]  Does Patient Present under Involuntary Commitment? No   IVC Papers Initial File  Date: No data recorded  [place] of Residence: [place]  Patient Currently Receiving the Following Services: Not Receiving Services   Determination of Need: No data recorded  Options For Referral: Other: Comment (Follow up with current provider)   Mamie Nick, LCAS

## 2020-04-23 NOTE — Care Management Obs Status (Signed)
[facility] NOTIFICATION   Patient Details  Name: Mark [name] MRN: [id number] Date of Birth: Oct 17, 1955   Medicare Observation Status Notification Given:  Yes    Tommy Medal 04/23/2020, 3:50 PM

## 2020-04-23 NOTE — ED Notes (Signed)
CRITICAL VALUE ALERT  Critical Value:  K+  Date & Time Notied:  04/23/2020 0850  Provider Notified: Dr. Denton Brick  Orders Received/Actions taken: see chat

## 2020-04-23 NOTE — Progress Notes (Signed)
Patient Demographics:    Mark [name], is a 64 y.o. male, DOB - 1956/04/06, DGL:[id number]  Admit date - 04/22/2020   Admitting Physician Mazi Brailsford Denton Brick, MD  Outpatient Primary MD for the patient is Leeanne Rio, MD  LOS - 0   Chief Complaint  Patient presents with  . Admission        Subjective:    Mark [name] today has no fevers, no emesis,  No chest pain,   --Shortness of breath and hypoxia persist -During this admission on 3 L patient's O2 sats dropped down to 86%, O2 sats were titrated up to 5 L/min to maintain O2 sats around 90% --Complains of depressed mood due to marital strife and chronic health conditions -Evaluated by TTS and deemed not to be suicidal  Assessment  & Plan :    Principal Problem:   Acute on chronic systolic CHF (congestive heart failure) (HCC) Active Problems:   Abnormal ECG   HTN (hypertension)   ESRD (end stage renal disease) on dialysis [facility])   Ischemic cardiomyopathy   Left hip pain   ESRD (end stage renal disease) ([facility])  Brief Summary:- 64 y.o. male with medical history significant for ESRD, ischemic cardiomyopathy with systolic heart failure, hypertension with chronic hypoxic respiratory failure on home O2--admitted on 04/22/2020 with worsening hypoxia and volume overload after missing hemodialysis sessions  A/p 1)Acute on chronic hypoxic respiratory failure--- at baseline patient is usually on 3 L of oxygen via nasal cannula -During this admission on 3 L patient's O2 sats dropped down to 86%, O2 sats were titrated up to 5 L/min to maintain O2 sats around 90% -Anticipate hypoxia will improve with hemodialysis to address volume overload status  2)ESRD--usually gets HD MWF last HD 04/19/2020--hyperkalemia noted  -discussed with nephrology service plan is for serial/back-to-back hemodialysis sessions to address patient's volume overload status and  worsening hypoxia -Anticipate that hyperkalemia will improve with hemodialysis, patient previously received Lokelma   Medication Issue:- Preferred narcotic agents for pain control are hydromorphone, fentanyl, and methadone. Morphine should not be used.  Baclofen should be avoided Avoid oral sodium phosphate and magnesium citrate based laxatives / bowel preps    3) depression--- -Complains of depressed mood due to marital strife and chronic health conditions -Evaluated by TTS and deemed not to be suicidal -Patient will need outpatient follow-up with behavioral health provider  4) chronic back pain/chronic pain syndrome--- continue PTA meds for pain control  5) acute on chronic combined systolic and diastolic dysfunction CHF--last echo from August 2021 with EF of 40 to 45% and grade 2 diastolic dysfunction --Manage as above #1 #2 with hemodialysis  6)DM2-diet controlled, no recent A1c Use Novolog/Humalog Sliding scale insulin with Accu-Cheks/Fingersticks as ordered   7) chronic anemia of ESRD--- hemoglobin is 8.6, -No evidence of ongoing bleeding, defer decision on ESA/EPO to nephrology team -Transfuse if clinically indicated  8)Social/Ethics--- previously had conversations with hospice, currently full code, will get additional palliative care consult for goals of care conversations  9) generalized weakness/recurrent falls--- PT eval appreciated recommends SNF rehab  Disposition/Need for in-Hospital Stay- patient unable to be discharged at this time due to -plan is for serial/back-to-back hemodialysis sessions to address patient's volume overload status and worsening hypoxia -  Status is:  Inpatient  Remains inpatient appropriate because:plan is for serial/back-to-back hemodialysis sessions to address patient's volume overload status and worsening hypoxia   Disposition: The patient is from: Home              Anticipated d/c is to: SNF              Anticipated d/c date is: 2 days               Patient currently is not medically stable to d/c. Barriers: Not Clinically Stable- -plan is for serial/back-to-back hemodialysis sessions to address patient's volume overload status and worsening hypoxia  Code Status : Full code  Family Communication:  (patient is alert, awake and coherent) *   Consults  : Neurology/palliative care  DVT Prophylaxis  :   - Heparin - SCDs   Lab Results  Component Value Date   PLT 246 04/23/2020    Inpatient Medications  Scheduled Meds: . aspirin EC  81 mg Oral Daily  . atorvastatin  40 mg Oral Daily  . Buprenorphine HCl  1 Film Oral BID  . Chlorhexidine Gluconate Cloth  6 each Topical Q0600  . DULoxetine  60 mg Oral Daily  . heparin  5,000 Units Subcutaneous Q8H  . hydrALAZINE  25 mg Oral TID  . levothyroxine  100 mcg Oral Daily  . sucralfate  1 g Oral TID   Continuous Infusions: . sodium chloride    . sodium chloride    . albumin human     PRN Meds:.sodium chloride, sodium chloride, acetaminophen **OR** acetaminophen, albumin human, lidocaine (PF), lidocaine-prilocaine, ondansetron **OR** ondansetron (ZOFRAN) IV, pentafluoroprop-tetrafluoroeth, polyethylene glycol    Anti-infectives (From admission, onward)   None        Objective:   Vitals:   04/23/20 1430 04/23/20 1500 04/23/20 1530 04/23/20 1600  BP: 110/64 117/68 119/68 108/64  Pulse: 97 98 96 95  Resp:      Temp:      TempSrc:      SpO2:      Weight:      Height:        Wt Readings from Last 3 Encounters:  04/23/20 115.7 kg  04/17/20 102 kg  04/12/20 97.5 kg     Intake/Output Summary (Last 24 hours) at 04/23/2020 1727 Last data filed at 04/23/2020 1100 Gross per 24 hour  Intake 550 ml  Output --  Net 550 ml     Physical Exam  Gen:- Awake Alert, chronically ill-appearing HEENT:- [place].AT, No sclera icterus Neck-Supple Neck, +ve JVD,.  Lungs-diminished breath sounds, bibasilar rales CV- S1, S2 normal, regular  Abd-  +ve B.Sounds, Abd Soft, No  tenderness,    Extremity/Skin:-2+ edema, pedal pulses present  Psych-affect is flat to depressed, oriented x3 Neuro-generalized weakness, no new focal deficits, no tremors   Data Review:   Micro Results Recent Results (from the past 240 hour(s))  Resp Panel by RT PCR (RSV, Flu A&B, Covid) - Nasopharyngeal Swab     Status: None   Collection Time: 04/17/20  6:18 PM   Specimen: Nasopharyngeal Swab  Result Value Ref Range Status   SARS Coronavirus 2 by RT PCR NEGATIVE NEGATIVE Final    Comment: (NOTE) SARS-CoV-2 target nucleic acids are NOT DETECTED.  The SARS-CoV-2 RNA is generally detectable in upper respiratoy specimens during the acute phase of infection. The lowest concentration of SARS-CoV-2 viral copies this assay can detect is 131 copies/mL. A negative result does not preclude SARS-Cov-2 infection and should not be used  as the sole basis for treatment or other patient management decisions. A negative result may occur with  improper specimen collection/handling, submission of specimen other than nasopharyngeal swab, presence of viral mutation(s) within the areas targeted by this assay, and inadequate number of viral copies (<131 copies/mL). A negative result must be combined with clinical observations, patient history, and epidemiological information. The expected result is Negative.  Fact Sheet for Patients:  PinkCheek.be  Fact Sheet for Healthcare Providers:  GravelBags.it  This test is no t yet approved or cleared by the [place] FDA and  has been authorized for detection and/or diagnosis of SARS-CoV-2 by FDA under an Emergency Use Authorization (EUA). This EUA will remain  in effect (meaning this test can be used) for the duration of the COVID-19 declaration under Section 564(b)(1) of the Act, 21 U.S.C. section 360bbb-3(b)(1), unless the authorization is terminated or revoked sooner.     Influenza A by  PCR NEGATIVE NEGATIVE Final   Influenza B by PCR NEGATIVE NEGATIVE Final    Comment: (NOTE) The Xpert Xpress SARS-CoV-2/FLU/RSV assay is intended as an aid in  the diagnosis of influenza from Nasopharyngeal swab specimens and  should not be used as a sole basis for treatment. Nasal washings and  aspirates are unacceptable for Xpert Xpress SARS-CoV-2/FLU/RSV  testing.  Fact Sheet for Patients: PinkCheek.be  Fact Sheet for Healthcare Providers: GravelBags.it  This test is not yet approved or cleared by the [place] FDA and  has been authorized for detection and/or diagnosis of SARS-CoV-2 by  FDA under an Emergency Use Authorization (EUA). This EUA will remain  in effect (meaning this test can be used) for the duration of the  Covid-19 declaration under Section 564(b)(1) of the Act, 21  U.S.C. section 360bbb-3(b)(1), unless the authorization is  terminated or revoked.    Respiratory Syncytial Virus by PCR NEGATIVE NEGATIVE Final    Comment: (NOTE) Fact Sheet for Patients: PinkCheek.be  Fact Sheet for Healthcare Providers: GravelBags.it  This test is not yet approved or cleared by the [place] FDA and  has been authorized for detection and/or diagnosis of SARS-CoV-2 by  FDA under an Emergency Use Authorization (EUA). This EUA will remain  in effect (meaning this test can be used) for the duration of the  COVID-19 declaration under Section 564(b)(1) of the Act, 21 U.S.C.  section 360bbb-3(b)(1), unless the authorization is terminated or  revoked. Performed at [facility], [address] [place], [place] [address]   Resp Panel by RT-PCR (Flu A&B, Covid) Nasopharyngeal Swab     Status: None   Collection Time: 04/22/20  8:10 PM   Specimen: Nasopharyngeal Swab; Nasopharyngeal(NP) swabs in vial transport medium  Result Value Ref Range Status   SARS Coronavirus  2 by RT PCR NEGATIVE NEGATIVE Final    Comment: (NOTE) SARS-CoV-2 target nucleic acids are NOT DETECTED.  The SARS-CoV-2 RNA is generally detectable in upper respiratory specimens during the acute phase of infection. The lowest concentration of SARS-CoV-2 viral copies this assay can detect is 138 copies/mL. A negative result does not preclude SARS-Cov-2 infection and should not be used as the sole basis for treatment or other patient management decisions. A negative result may occur with  improper specimen collection/handling, submission of specimen other than nasopharyngeal swab, presence of viral mutation(s) within the areas targeted by this assay, and inadequate number of viral copies(<138 copies/mL). A negative result must be combined with clinical observations, patient history, and epidemiological information. The expected result is Negative.  Fact Sheet for Patients:  EntrepreneurPulse.com.au  Fact Sheet for Healthcare Providers:  IncredibleEmployment.be  This test is no t yet approved or cleared by the [place] FDA and  has been authorized for detection and/or diagnosis of SARS-CoV-2 by FDA under an Emergency Use Authorization (EUA). This EUA will remain  in effect (meaning this test can be used) for the duration of the COVID-19 declaration under Section 564(b)(1) of the Act, 21 U.S.C.section 360bbb-3(b)(1), unless the authorization is terminated  or revoked sooner.       Influenza A by PCR NEGATIVE NEGATIVE Final   Influenza B by PCR NEGATIVE NEGATIVE Final    Comment: (NOTE) The Xpert Xpress SARS-CoV-2/FLU/RSV plus assay is intended as an aid in the diagnosis of influenza from Nasopharyngeal swab specimens and should not be used as a sole basis for treatment. Nasal washings and aspirates are unacceptable for Xpert Xpress SARS-CoV-2/FLU/RSV testing.  Fact Sheet for Patients: EntrepreneurPulse.com.au  Fact  Sheet for Healthcare Providers: IncredibleEmployment.be  This test is not yet approved or cleared by the [place] FDA and has been authorized for detection and/or diagnosis of SARS-CoV-2 by FDA under an Emergency Use Authorization (EUA). This EUA will remain in effect (meaning this test can be used) for the duration of the COVID-19 declaration under Section 564(b)(1) of the Act, 21 U.S.C. section 360bbb-3(b)(1), unless the authorization is terminated or revoked.  Performed at [facility], [address], [place], [place] [address]     Radiology Reports DG Chest 2 View  Result Date: 04/17/2020 CLINICAL DATA:  Shortness of breath EXAM: CHEST - 2 VIEW COMPARISON:  April 12, 2020 FINDINGS: The cardiomediastinal silhouette is unchanged and enlarged in contour.Perihilar vascular fullness, unchanged. Small bilateral pleural effusions, similar comparison to prior. No pneumothorax. Diffuse interstitial prominence. Patchy bibasilar heterogeneous opacities, unchanged. Visualized abdomen is unremarkable. Multilevel degenerative changes of the thoracic spine. Degenerative changes of the RIGHT acromioclavicular joint. IMPRESSION: Constellation of findings are favored to reflect a combination of pulmonary edema and atelectasis. Superimposed infection remains in the differential. Electronically Signed   By: Valentino Saxon MD   On: 04/17/2020 14:59   CT ABDOMEN PELVIS W CONTRAST  Result Date: 04/12/2020 CLINICAL DATA:  64 year old male with flank pain and left hip pain for 2 days. No known injury. In stage renal disease on dialysis since 2016. EXAM: CT ABDOMEN AND PELVIS WITH CONTRAST TECHNIQUE: Multidetector CT imaging of the abdomen and pelvis was performed using the standard protocol following bolus administration of intravenous contrast. CONTRAST:  12mL OMNIPAQUE IOHEXOL 300 MG/ML  SOLN COMPARISON:  [facility] CT Abdomen and Pelvis 03/31/2020 and earlier.  FINDINGS: Lower chest: Stable cardiomegaly. No pericardial effusion. Calcified coronary artery and aortic atherosclerosis. Mildly increased bilateral lung base pleural effusions which appear mildly loculated. Associated increased lung base opacity which is mostly enhancing and more resembles atelectasis than pneumonia. Hepatobiliary: Chronically absent gallbladder. Geographic decreased liver density in keeping with hepatic steatosis. Pancreas: Negative aside from dystrophic calcifications of the pancreatic head and uncinate, stable. Spleen: Stable spleen size at the upper limits of normal. Several punctate calcified granuloma are stable. Adrenals/Urinary Tract: Adrenal glands remain within normal limits. Stable kidneys with chronic renal atrophy, small bilateral renal cysts, renal vascular calcifications. Superimposed bilateral collecting system calculi, including a 7 mm diameter calculus unchanged within the left renal pelvis (series 6, image 64) and a smaller more amorphous calcification at the junction of the right renal pelvis and upper pole calyx (series 6, image 70). No subsequent hydronephrosis, but  left pararenal stranding and soft tissue edema has increased since last month (series 2, image 44). Decreased renal enhancement in the setting of renal failure limits evaluation for acute kidney infection. Streak artifact from lumbar hardware limits visualization of the left ureter but both ureters appear to remain decompressed. Stable, unremarkable urinary bladder. Stomach/Bowel: Increased moderate retained stool in the rectosigmoid colon including a moderate stool ball in the rectum (series 2, image 79). Stable retained stool elsewhere throughout redundant large bowel from the CT last month. The cecum is on a lax mesentery located in the right upper quadrant just below the liver edge. No large bowel inflammation identified. Negative terminal ileum. Appendix not delineated. No dilated small bowel. Oral contrast  not administered today. Decompressed stomach and duodenum. No free air, free fluid. Vascular/Lymphatic: Extensive Aortoiliac calcified atherosclerosis. Partially calcified chronic infrarenal aortic soft plaque or thrombus (series 2, image 37). Major arterial structures remain patent despite atherosclerosis. Portal venous system is grossly patent on delayed images. Stable lymph nodes, upper limits of normal in the retroperitoneum. Reproductive: Negative. Other: Presacral stranding is stable from last month. No pelvic free fluid. Musculoskeletal: Substantially increased asymmetric subcutaneous edema in the visible lower chest, abdomen and pelvis. Confluent soft tissue edema in the right lower chest wall laterally (series 2, image 22). No soft tissue gas. No organized fluid collection. Abnormal bone mineralization compatible with renal osteodystrophy is stable. Superimposed multilevel lower lumbar fusion sequelae. Stable hardware. No paraspinal soft tissue inflammation identified. No acute osseous abnormality identified. IMPRESSION: 1. Increased left pararenal soft tissue edema since last month with a chronic 7 mm calculus at the left renal pelvis but no hydronephrosis at this time. This might reflect intermittent renal obstruction. Decreased renal enhancement in the setting of chronic renal failure limits the sensitivity for acute pyelonephritis. 2. Substantially increased body wall edema since last month. Small partially loculated lung base pleural effusions have increased along with bilateral lower lobe opacity favored to be atelectasis. 3. Stable cardiomegaly. Calcified coronary artery and Aortic Atherosclerosis (ICD10-I70.0). Increased retained stool in redundant large bowel. Renal osteodystrophy. Hepatic steatosis. Mild chronic pancreatic calcifications. Electronically Signed   By: Genevie Ann M.D.   On: 04/12/2020 15:17   DG Chest Portable 1 View  Result Date: 04/22/2020 CLINICAL DATA:  Shortness of breath and  weakness. Chronic renal failure EXAM: PORTABLE CHEST 1 VIEW COMPARISON:  April 17, 2020 FINDINGS: There is cardiomegaly with mild pulmonary venous hypertension. There is interstitial edema with small pleural effusions bilaterally. There is probable loculated effusion on the right laterally with atelectatic change. No airspace consolidation evident. No adenopathy. Degenerative change in each shoulder noted. IMPRESSION: Cardiomegaly with pulmonary vascular congestion. Pleural effusions and pulmonary edema. The appearance is consistent with a degree of volume overload/congestive heart failure. Suspect loculated effusion along the lateral right base with atelectatic change in this area. No consolidation. Electronically Signed   By: Lowella Grip III M.D.   On: 04/22/2020 14:54   DG Chest Port 1 View  Result Date: 04/12/2020 CLINICAL DATA:  Chest pain. Fell 2 days ago. History of congestive heart failure. EXAM: PORTABLE CHEST 1 VIEW COMPARISON:  01/09/2020 FINDINGS: Chronic cardiomegaly. Pulmonary venous hypertension. Interstitial and early alveolar pulmonary edema. Worsened density in the lower lungs which could simply be secondary to congestive heart failure. Basilar pneumonia not excluded. Inferior left lung excluded from the film. IMPRESSION: Congestive heart failure with pulmonary edema. Worsened density in the lower lungs which could simply be secondary to congestive heart failure. Basilar pneumonia not  excluded. Electronically Signed   By: Nelson Chimes M.D.   On: 04/12/2020 15:24   DG Hip Port Unilat W or Wo Pelvis 1 View Left  Result Date: 04/22/2020 CLINICAL DATA:  Pain following fall EXAM: DG HIP (WITH OR WITHOUT PELVIS) 1V PORT LEFT COMPARISON:  None. FINDINGS: Frontal view left hip obtained. On frontal view, no fracture or dislocation is appreciable. There is mild narrowing of the left hip joint. No erosive change evident. There are multiple foci of arterial vascular calcification IMPRESSION:  No fracture seen on frontal view. No dislocation. Mild narrowing left hip joint. Given history of trauma, correlation with lateral view may well be advisable for more precise assessment. Electronically Signed   By: Lowella Grip III M.D.   On: 04/22/2020 16:28   DG HIP UNILAT WITH PELVIS 2-3 VIEWS LEFT  Result Date: 04/22/2020 CLINICAL DATA:  64 year old male with hip pain. EXAM: DG HIP (WITH OR WITHOUT PELVIS) 2-3V LEFT COMPARISON:  Left hip radiograph dated 04/22/2020. FINDINGS: Evaluation is very limited due to body habitus and osteopenia. No definite acute fracture identified. There is no dislocation. The bones are osteopenic. Extensive lower lumbar fixation hardware. The soft tissues are unremarkable. IMPRESSION: No definite acute fracture or dislocation. Electronically Signed   By: Anner Crete M.D.   On: 04/22/2020 21:34     CBC Recent Labs  Lab 04/17/20 1435 04/22/20 1454 04/23/20 0345  WBC 16.7* 13.8* 14.2*  HGB 8.8* 8.7* 8.6*  HCT 30.2* 30.1* 29.4*  PLT 309 251 246  MCV 95.0 95.9 93.6  MCH 27.7 27.7 27.4  MCHC 29.1* 28.9* 29.3*  RDW 16.7* 17.0* 17.2*  LYMPHSABS 0.2* 0.3*  --   MONOABS 0.4 0.8  --   EOSABS 0.0 0.1  --   BASOSABS 0.0 0.0  --     Chemistries  Recent Labs  Lab 04/17/20 1435 04/22/20 1454 04/23/20 0345 04/23/20 0814  NA 134* 132* 133*  --   K 5.0 5.9* 6.4* 6.3*  CL 96* 94* 96*  --   CO2 28 28 27   --   GLUCOSE 240* 137* 152*  --   BUN 43* 49* 51*  --   CREATININE 4.23* 4.20* 4.41*  --   CALCIUM 8.5* 8.3* 8.4*  --   MG 1.7  --   --   --   AST 17  --   --   --   ALT 22  --   --   --   ALKPHOS 93  --   --   --   BILITOT 0.5  --   --   --    ------------------------------------------------------------------------------------------------------------------ No results for input(s): CHOL, HDL, LDLCALC, TRIG, CHOLHDL, LDLDIRECT in the last 72 hours.  Lab Results  Component Value Date   HGBA1C 6.1 (H) 02/14/2015    ------------------------------------------------------------------------------------------------------------------ No results for input(s): TSH, T4TOTAL, T3FREE, THYROIDAB in the last 72 hours.  Invalid input(s): FREET3 ------------------------------------------------------------------------------------------------------------------ No results for input(s): VITAMINB12, FOLATE, FERRITIN, TIBC, IRON, RETICCTPCT in the last 72 hours.  Coagulation profile No results for input(s): INR, PROTIME in the last 168 hours.  No results for input(s): DDIMER in the last 72 hours.  Cardiac Enzymes No results for input(s): CKMB, TROPONINI, MYOGLOBIN in the last 168 hours.  Invalid input(s): CK ------------------------------------------------------------------------------------------------------------------    Component Value Date/Time   BNP 3,562.0 (H) 04/12/2020 1356    Roxan Hockey M.D on 04/23/2020 at 5:27 PM  Go to [email or web address] - for contact info  [facility] - Office  [phone]

## 2020-04-23 NOTE — Evaluation (Signed)
Physical Therapy Evaluation Patient Details Name: Mark [name] MRN: [id number] DOB: 1955-06-16 Today's Date: 04/23/2020   History of Present Illness  Mark [name] is a 64 y.o. male with medical history significant for ESRD, ischemic cardiomyopathy with systolic heart failure, hypertension with chronic respiratory failure on 3 l. Patient presented to the ED with complaints of left hip pain.  Patient has chronic pain in his left hip, but reports over the past 2 days the pain is significantly worsened.  He reports a fall on his left side recently.  He did not hit his head.  He ambulates with a walker.  Patient was also Dialyzed yesterday, but because of his hip pain he was not able to go for dialysis.  Patient reports some increase in his difficulty breathing.  He has chronic lower extremity swelling.  He does not make any urine.  Patient was dialyzed when he was in the ED 04/17/2020. Patient was also in the process of being transitioned over to stay at an assisted living facility/nursing home.   Clinical Impression  Patient limited to rolling side to side with Mod assist, only tolerated partial long sitting in bed due to c/o severe low back paint with radiation down left hip to knee, unable to attempt dangling legs off side of bed.  Patient put back to bed with Mod/max assist to reposition - RN notified concerning pain.  Patient will benefit from continued physical therapy in hospital and recommended venue below to increase strength, balance, endurance for safe ADLs and gait.    Follow Up Recommendations SNF    Equipment Recommendations  None recommended by PT    Recommendations for Other Services       Precautions / Restrictions Precautions Precautions: Fall Restrictions Weight Bearing Restrictions: No      Mobility  Bed Mobility Overal bed mobility: Needs Assistance Bed Mobility: Rolling;Supine to Sit;Sit to Supine Rolling: Mod assist   Supine to sit: Mod assist;Max  assist Sit to supine: Mod assist   General bed mobility comments: limited to partial long sitting due to c/o severe pain left hip    Transfers                    Ambulation/Gait                Stairs            Wheelchair Mobility    Modified Rankin (Stroke Patients Only)       Balance Overall balance assessment: Needs assistance Sitting-balance support: Bilateral upper extremity supported;Feet unsupported Sitting balance-Leahy Scale: Poor Sitting balance - Comments: limited mostly due to c/o severe pain in left thigh                                     Pertinent Vitals/Pain Pain Assessment: Faces Faces Pain Scale: Hurts whole lot Pain Location: low back with radiation left anterior thigh to knee Pain Descriptors / Indicators: Grimacing;Guarding;Sharp Pain Intervention(s): Limited activity within patient's tolerance;Monitored during session;Repositioned    Home Living Family/patient expects to be discharged to:: Private residence Living Arrangements: Spouse/significant other Available Help at Discharge: Family;Available PRN/intermittently Type of Home: House Home Access: Stairs to enter Entrance Stairs-Rails: None Entrance Stairs-Number of Steps: 2 Home Layout: One level Home Equipment: Walker - 2 wheels;Shower seat;Grab bars - toilet;Grab bars - tub/shower      Prior Function Level of Independence: Independent with assistive  device(s)         Comments: household ambulator using RW     Hand Dominance        Extremity/Trunk Assessment   Upper Extremity Assessment Upper Extremity Assessment: Generalized weakness    Lower Extremity Assessment Lower Extremity Assessment: Generalized weakness    Cervical / Trunk Assessment Cervical / Trunk Assessment: Normal  Communication   Communication: No difficulties  Cognition Arousal/Alertness: Awake/alert Behavior During Therapy: WFL for tasks assessed/performed Overall  Cognitive Status: Within Functional Limits for tasks assessed                                        General Comments      Exercises     Assessment/Plan    PT Assessment Patient needs continued PT services  PT Problem List Decreased strength;Decreased activity tolerance;Decreased balance;Decreased mobility;Pain       PT Treatment Interventions DME instruction;Gait training;Stair training;Functional mobility training;Therapeutic activities;Therapeutic exercise;Balance training;Patient/family education    PT Goals (Current goals can be found in the Care Plan section)  Acute Rehab PT Goals Patient Stated Goal: return home after rehab PT Goal Formulation: With patient Time For Goal Achievement: 05/07/20 Potential to Achieve Goals: Good    Frequency Min 3X/week   Barriers to discharge        Co-evaluation               AM-PAC PT "6 Clicks" Mobility  Outcome Measure Help needed turning from your back to your side while in a flat bed without using bedrails?: A Lot Help needed moving from lying on your back to sitting on the side of a flat bed without using bedrails?: A Lot Help needed moving to and from a bed to a chair (including a wheelchair)?: Total Help needed standing up from a chair using your arms (e.g., wheelchair or bedside chair)?: Total Help needed to walk in hospital room?: Total Help needed climbing 3-5 steps with a railing? : Total 6 Click Score: 8    End of Session Equipment Utilized During Treatment: Oxygen Activity Tolerance: Patient tolerated treatment well;Patient limited by fatigue;Patient limited by pain Patient left: in bed;with call bell/phone within reach Nurse Communication: Mobility status PT Visit Diagnosis: Unsteadiness on feet (R26.81);Other abnormalities of gait and mobility (R26.89);Muscle weakness (generalized) (M62.81)    Time: 8299-3716 PT Time Calculation (min) (ACUTE ONLY): 23 min   Charges:   PT  Evaluation $PT Eval Moderate Complexity: 1 Mod PT Treatments $Therapeutic Activity: 23-37 mins        9:50 AM, 04/23/20 Lonell Grandchild, MPT Physical Therapist with [facility] [phone] office [phone] mobile phone

## 2020-04-23 NOTE — ED Notes (Signed)
Pt increased to 5L [place].

## 2020-04-23 NOTE — ED Notes (Signed)
Pts. Buprenorphine taken to pharmacy to be verified.

## 2020-04-23 NOTE — Plan of Care (Signed)
  Problem: Acute Rehab PT Goals(only PT should resolve) Goal: Pt Will Go Supine/Side To Sit Outcome: Progressing Flowsheets (Taken 04/23/2020 0955) Pt will go Supine/Side to Sit:  with minimal assist  with moderate assist Goal: Patient Will Transfer Sit To/From Stand Outcome: Progressing Flowsheets (Taken 04/23/2020 0955) Patient will transfer sit to/from stand:  with minimal assist  with moderate assist Goal: Pt Will Ambulate Outcome: Progressing Flowsheets (Taken 04/23/2020 0955) Pt will Ambulate:  25 feet  with minimal assist  with moderate assist  with rolling walker   Problem: Acute Rehab PT Goals(only PT should resolve) Goal: Pt Will Transfer Bed To Chair/Chair To Bed Outcome: Progressing Flowsheets (Taken 04/23/2020 0955) Pt will Transfer Bed to Chair/Chair to Bed: with mod assist   9:56 AM, 04/23/20 Lonell Grandchild, MPT Physical Therapist with [facility] [phone] office [phone] mobile phone

## 2020-04-23 NOTE — Consult Note (Signed)
ESRD Consult Note [facility]  Requesting provider: Roxan Hockey, MD  Outpatient dialysis unit: [facility] Outpatient dialysis schedule: MWF  Assessment/Recommendations:  ESRD: -tried calling his facility multiple times for outpatient orders--no answer. Bath and dialyzer unknown (he does not report any adverse events I.e. allergic rxn at his outpatient HD unit). Will go off of his previous admission given the fact that he tolerated those treatments without incident. HD today and then will plan for his next treatment on Monday if he's still here and provided there are no other issues over the weekend  Volume/ hypertension: EDW unknown.  Hyperkalemia: s/p Lokelma, HD today with 2K bath. Restrict K in diet.  Can utilize [facility] over the weekend if needed  Anemia of Chronic Kidney Disease: Hemoglobin 8.6. It is unknown what he is receiving in regards to iron/ESA, transfuse for hgb <7.   Secondary Hyperparathyroidism/Hyperphosphatemia: monitor phos, place on phos restriction in diet   Vascular access: left AVF +b/t. Suspecting he might have an outflow stenosis, recommend that he gets referred to an access center for a fistulogram after discharge (his dialysis unit can take care of this)  Acute on chronic systolic and diastolic heart failure with pulmonary vascular congestion.  Will UF as tolerated, on fluid restriction  Diabetes mellitus type 2.  Apparently diet controlled, management per primary service  Leukocytosis.  Work-up and management per primary service   # Additional recommendations: - Dose all meds for creatinine clearance < 10 ml/min  - Unless absolutely necessary, no MRIs with gadolinium.  - Implement save arm precautions.  Prefer needle sticks in the dorsum of the hands or wrists.  No blood pressure measurements in arm. - If blood transfusion is requested during hemodialysis sessions, please alert [place] prior to the session.  - If a hemodialysis catheter  line culture is requested, please alert [place] as only hemodialysis nurses are able to collect those specimens.   Recommendations were discussed with the primary team.  Will not be physically seen over the weekend from a nephrology perspective, please call with any questions/concerns.   History of Present Illness: Mark [name] is a 64 y.o. male with a past medical history of ESRD, ischemic cardiomyopathy with systolic heart failure, hypertension, chronic respiratory failure on home O2, diabetes mellitus, coronary disease, GERD, hyperlipidemia who presents with right hip pain.  Was found to have hyperkalemia and pulmonary vascular congestion/volume overload on presentation.  He did receive Lokelma for potassium 6.4. Apparently he is under hospice service and being transitioned to ALF/SNF (SW/CM and primary team are working on this)? Last HD on Monday (missed Wed tx). Typically has 5L UF'ed. He does report feeling more 'puffy' which is typically how he knows he his volume up. Interestingly, has been having some prolonged bleeding (~15 min) after decannulation post HD. Has not had his AVF worked on over a year. Denies fevers, chest pain, worsening SOB, dizziness.   Medications:  Current Facility-Administered Medications  Medication Dose Route Frequency Provider Last Rate Last Admin  . acetaminophen (TYLENOL) tablet 650 mg  650 mg Oral Q6H PRN Emokpae, Ejiroghene E, MD       Or  . acetaminophen (TYLENOL) suppository 650 mg  650 mg Rectal Q6H PRN Emokpae, Ejiroghene E, MD      . aspirin EC tablet 81 mg  81 mg Oral Daily Emokpae, Ejiroghene E, MD      . atorvastatin (LIPITOR) tablet 40 mg  40 mg Oral Daily Emokpae, Ejiroghene E, MD      .  Buprenorphine HCl FILM 1 Film  1 Film Oral BID Emokpae, Ejiroghene E, MD      . DULoxetine (CYMBALTA) DR capsule 60 mg  60 mg Oral Daily Emokpae, Ejiroghene E, MD   60 mg at 04/22/20 2222  . heparin injection 5,000 Units  5,000 Units Subcutaneous Q8H Emokpae,  Ejiroghene E, MD   5,000 Units at 04/22/20 2224  . hydrALAZINE (APRESOLINE) tablet 25 mg  25 mg Oral TID Emokpae, Ejiroghene E, MD   25 mg at 04/22/20 2223  . levothyroxine (SYNTHROID) tablet 100 mcg  100 mcg Oral Daily Emokpae, Ejiroghene E, MD      . ondansetron (ZOFRAN) tablet 4 mg  4 mg Oral Q6H PRN Emokpae, Ejiroghene E, MD       Or  . ondansetron (ZOFRAN) injection 4 mg  4 mg Intravenous Q6H PRN Emokpae, Ejiroghene E, MD      . polyethylene glycol (MIRALAX / GLYCOLAX) packet 17 g  17 g Oral Daily PRN Emokpae, Ejiroghene E, MD      . sucralfate (CARAFATE) tablet 1 g  1 g Oral TID Emokpae, Ejiroghene E, MD   1 g at 04/22/20 2224   Current Outpatient Medications  Medication Sig Dispense Refill  . albuterol (PROAIR HFA) 108 (90 Base) MCG/ACT inhaler Inhale 2 puffs into the lungs every 4 (four) hours as needed.    Mar[place] Kitchen aspirin EC 81 MG tablet Take 1 tablet (81 mg total) by mouth daily.    Marland Kitchen atorvastatin (LIPITOR) 40 MG tablet Take 1 tablet (40 mg total) by mouth daily.    Marland Kitchen BELBUCA 900 MCG FILM Take 1 Film by mouth 2 (two) times daily.    . betamethasone dipropionate (DIPROLENE) 0.05 % cream Apply 1 application topically 2 (two) times daily as needed (irritation).    Marland Kitchen doxycycline (VIBRAMYCIN) 100 MG capsule Take 1 capsule (100 mg total) by mouth 2 (two) times daily for 7 days. 14 capsule 0  . DULoxetine (CYMBALTA) 60 MG capsule Take 60 mg by mouth daily.     Marland Kitchen gabapentin (NEURONTIN) 100 MG capsule Take 100 mg by mouth 3 (three) times daily.    . hydrALAZINE (APRESOLINE) 25 MG tablet TAKE ONE TABLET BY MOUTH THREE TIMES DAILY 90 tablet 6  . levothyroxine (SYNTHROID) 100 MCG tablet Take 100 mcg by mouth daily.    Marland Kitchen lidocaine-prilocaine (EMLA) cream Apply 1 application topically as needed (before dialysis).    . nitroGLYCERIN (NITROSTAT) 0.4 MG SL tablet Place 1 tablet (0.4 mg total) under the tongue every 5 (five) minutes x 3 doses as needed for chest pain (If no relief after 3rd dose, proceed to  the ED for an evaluation). I 25 tablet 0  . ondansetron (ZOFRAN) 4 MG tablet Take 4 mg by mouth every 8 (eight) hours as needed.    . OXYGEN Inhale 3.5 L into the lungs continuous.     . pregabalin (LYRICA) 100 MG capsule Take 1 capsule by mouth in the morning, at noon, and at bedtime.    . sucralfate (CARAFATE) 1 g tablet Take 1 g by mouth 3 (three) times daily.    . traMADol (ULTRAM) 50 MG tablet Take 50 mg by mouth 2 (two) times daily as needed.    Marland Kitchen amoxicillin (AMOXIL) 875 MG tablet Take 875 mg by mouth 2 (two) times daily. (Patient not taking: Reported on 04/22/2020)    . carvedilol (COREG) 6.25 MG tablet Take 1 tablet by mouth in the morning and at bedtime. (Patient not taking:  Reported on 04/22/2020)    . cloNIDine (CATAPRES) 0.1 MG tablet Take 0.1 mg by mouth daily. (Patient not taking: Reported on 04/22/2020)    . polyethylene glycol (MIRALAX / GLYCOLAX) 17 g packet Take 17 g by mouth as needed.  (Patient not taking: Reported on 04/22/2020)    . predniSONE (DELTASONE) 20 MG tablet Take 1 tablet (20 mg total) by mouth 2 (two) times daily. (Patient not taking: Reported on 04/22/2020) 10 tablet 0   Facility-Administered Medications Ordered in Other Encounters  Medication Dose Route Frequency Provider Last Rate Last Admin  . 0.9 %  sodium chloride infusion   Intravenous Continuous Monia Sabal, PA-C   New Bag at 03/08/18 0920  . ceFAZolin (ANCEF) 2 g in dextrose 5 % 100 mL IVPB  2 g Intravenous Once Monia Sabal, PA-C         ALLERGIES Morphine and related and Varenicline  MEDICAL HISTORY Past Medical History:  Diagnosis Date  . Anxiety    occas. panic attack, takes xanax occas  . Arthritis    herniated disc, lumbar   . Back pain   . CAD (coronary artery disease)    03/25/2015 95% prox to mid LAD treated with Promus Premier DES 3.5x27mm postdilated to 4.19mm, 100% prox to distal RCA lesion filled in the PDA region via collaterals from LAD and PLA  . Cancer ([place])    - skin ca  on face- removed   . CHF (congestive heart failure) ([place])   . Chronic kidney disease   . Complication of anesthesia    " it takes a long time to get over it."   . Diabetes mellitus without complication ([place])    type 2  . Dialysis patient ([place])   . Fibromyalgia   . GERD (gastroesophageal reflux disease)    otc- pepcid , approx. every other  day    . H/O acute respiratory failure   . Hematuria    being followed by Dr. Hinda Lenis for decreased kidney function   . History of blood product transfusion 02/13/2014   After having back surgery  . Hypercholesteremia   . Hypertension   . Myocardial infarction ([place]) 03/2015  . Pneumonia    "walking"  . Shortness of breath dyspnea   . Sleep apnea    test aborted, due to not able to relax , since the aborted test he had surgery for gallbladder & he reports that he was told that he has sleep apnea      SOCIAL HISTORY Social History   Socioeconomic History  . Marital status: Married    Spouse name: Not on file  . Number of children: 2  . Years of education: HS  . Highest education level: Not on file  Occupational History  . Not on file  Tobacco Use  . Smoking status: Current Every Day Smoker    Packs/day: 0.50    Years: 40.00    Pack years: 20.00    Types: Cigarettes    Start date: 10/17/1973    Last attempt to quit: 02/07/2015    Years since quitting: 5.2  . Smokeless tobacco: Never Used  Vaping Use  . Vaping Use: Never used  Substance and Sexual Activity  . Alcohol use: No    Alcohol/week: 0.0 standard drinks  . Drug use: No  . Sexual activity: Not on file  Other Topics Concern  . Not on file  Social History Narrative   Drinks 3 Diet sodas a day    Social Determinants  of Health   Financial Resource Strain:   . Difficulty of Paying Living Expenses: Not on file  Food Insecurity:   . Worried About Charity fundraiser in the Last Year: Not on file  . Ran Out of Food in the Last Year: Not on file  Transportation Needs:   .  Lack of Transportation (Medical): Not on file  . Lack of Transportation (Non-Medical): Not on file  Physical Activity:   . Days of Exercise per Week: Not on file  . Minutes of Exercise per Session: Not on file  Stress:   . Feeling of Stress : Not on file  Social Connections:   . Frequency of Communication with Friends and Family: Not on file  . Frequency of Social Gatherings with Friends and Family: Not on file  . Attends Religious Services: Not on file  . Active Member of Clubs or Organizations: Not on file  . Attends Archivist Meetings: Not on file  . Marital Status: Not on file  Intimate Partner Violence:   . Fear of Current or Ex-Partner: Not on file  . Emotionally Abused: Not on file  . Physically Abused: Not on file  . Sexually Abused: Not on file     FAMILY HISTORY Family History  Adopted: Yes  Problem Relation Age of Onset  . Prostate cancer Father   . Cancer Father        prostate  . Heart failure Maternal Grandmother   . Heart attack Maternal Grandfather   . Colon cancer Maternal Grandfather      Review of Systems: 12 systems were reviewed and negative except per HPI  Physical Exam: Vitals:   04/23/20 0608 04/23/20 0700  BP: 136/83 134/70  Pulse: 87 77  Resp: 14 17  Temp:    SpO2: 92% 95%   No intake/output data recorded.  Intake/Output Summary (Last 24 hours) at 04/23/2020 0804 Last data filed at 04/23/2020 0547 Gross per 24 hour  Intake 50 ml  Output --  Net 50 ml   General: well-appearing, no acute distress HEENT: anicteric sclera, MMM, +perioribital edema CV: normal rate, no murmurs Lungs: poor air exchange diffusely, on O2, unlabored, b/l chest expansion Abd: soft, non-tender, non-distended Skin: no visible lesions or rashes Msk: tight edema bilateral LE's  Neuro: normal speech, no gross focal deficits  Access: left AVF +b/t/patent  Test Results Reviewed Lab Results  Component Value Date   NA 133 (L) 04/23/2020   K 6.4  (HH) 04/23/2020   CL 96 (L) 04/23/2020   CO2 27 04/23/2020   BUN 51 (H) 04/23/2020   CREATININE 4.41 (H) 04/23/2020   CALCIUM 8.4 (L) 04/23/2020   ALBUMIN 2.4 (L) 04/17/2020   PHOS 4.7 (H) 01/11/2020    I have reviewed relevant outside healthcare records

## 2020-04-23 NOTE — NC FL2 (Signed)
[place] LEVEL OF CARE SCREENING TOOL     IDENTIFICATION  Patient Name: Mark [name] Birthdate: [birth year] Sex: male Admission Date (Current Location): 04/22/2020  [facility] and [place] Number:  [employer] and Address:  [place] [address] [place]      Provider Number: [id number]  Attending Physician Name and Address:  Roxan Hockey, MD  Relative Name and Phone Number:  Edker Punt (wife) Ph: [phone]    Current Level of Care: Hospital Recommended Level of Care: [place] Prior Approval Number:    Date Approved/Denied:   PASRR Number: [id number]  Discharge Plan: SNF    Current Diagnoses: Patient Active Problem List   Diagnosis Date Noted  . Left hip pain 04/22/2020  . Acute on chronic systolic CHF (congestive heart failure) ([place]) 01/10/2020  . Volume overload 01/10/2020  . Hypoxia 01/09/2020  . Positive colorectal cancer screening using Cologuard test 02/11/2018  . Ischemic cardiomyopathy   . Bacteremia   . Anxiety state 04/06/2016  . Diabetes mellitus type 2 in obese ([place]) 04/06/2016  . Bacteremia associated with intravascular line ([place]) 04/04/2016  . Abnormal stress test 03/25/2015  . Abnormal nuclear stress test   . Cardiomyopathy ([place])   . Chronic systolic heart failure ([place])   . ESRD (end stage renal disease) on dialysis ([place])   . Pre-operative cardiovascular examination   . Positive cardiac stress test 03/24/2015  . Essential hypertension   . Acute respiratory failure ([place]) 02/12/2015  . Renal failure   . Hyperkalemia   . Chronic kidney disease (CKD), stage IV (severe) ([place]) 01/22/2015  . HNP (herniated nucleus pulposus), lumbar 02/13/2014  . Abnormal ECG 02/12/2014  . HTN (hypertension) 02/12/2014  . Bradycardia 02/12/2014    Orientation RESPIRATION BLADDER Height & Weight     Self, Time, Situation, Place  O2 (3L/min) Continent Weight: 240 lb 4.8 oz (109  kg) Height:  5\' 11"  (180.3 cm)  BEHAVIORAL SYMPTOMS/MOOD NEUROLOGICAL BOWEL NUTRITION STATUS      Continent Diet  AMBULATORY STATUS COMMUNICATION OF NEEDS Skin   Extensive Assist Verbally Normal (AV fistula left arm)                       Personal Care Assistance Level of Assistance  Bathing, Feeding, Dressing Bathing Assistance: Maximum assistance Feeding assistance: Independent Dressing Assistance: Maximum assistance     Functional Limitations Info  Sight, Hearing, Speech Sight Info: Adequate Hearing Info: Adequate Speech Info: Adequate    SPECIAL CARE FACTORS FREQUENCY  PT (By licensed PT)     PT Frequency: 5x              Contractures Contractures Info: Not present    Additional Factors Info  Allergies, Code Status Code Status Info: Full Allergies Info: Morphine And Related; Varenicline           Current Medications (04/23/2020):  This is the current hospital active medication list Current Facility-Administered Medications  Medication Dose Route Frequency Provider Last Rate Last Admin  . acetaminophen (TYLENOL) tablet 650 mg  650 mg Oral Q6H PRN Emokpae, Ejiroghene E, MD       Or  . acetaminophen (TYLENOL) suppository 650 mg  650 mg Rectal Q6H PRN Emokpae, Ejiroghene E, MD      . aspirin EC tablet 81 mg  81 mg Oral Daily Emokpae, Ejiroghene E, MD   81 mg at 04/23/20 1002  . atorvastatin (LIPITOR) tablet 40 mg  40  mg Oral Daily Emokpae, Ejiroghene E, MD   40 mg at 04/23/20 1003  . Buprenorphine HCl FILM 1 Film  1 Film Oral BID Emokpae, Ejiroghene E, MD   1 Film at 04/23/20 1105  . Chlorhexidine Gluconate Cloth 2 % PADS 6 each  6 each Topical Q0600 Gean Quint, MD      . DULoxetine (CYMBALTA) DR capsule 60 mg  60 mg Oral Daily Emokpae, Ejiroghene E, MD   60 mg at 04/23/20 1003  . heparin injection 5,000 Units  5,000 Units Subcutaneous Q8H Emokpae, Ejiroghene E, MD   5,000 Units at 04/22/20 2224  . hydrALAZINE (APRESOLINE) tablet 25 mg  25 mg Oral TID  Emokpae, Ejiroghene E, MD   25 mg at 04/23/20 1004  . levothyroxine (SYNTHROID) tablet 100 mcg  100 mcg Oral Daily Emokpae, Ejiroghene E, MD   100 mcg at 04/23/20 1003  . ondansetron (ZOFRAN) tablet 4 mg  4 mg Oral Q6H PRN Emokpae, Ejiroghene E, MD       Or  . ondansetron (ZOFRAN) injection 4 mg  4 mg Intravenous Q6H PRN Emokpae, Ejiroghene E, MD      . polyethylene glycol (MIRALAX / GLYCOLAX) packet 17 g  17 g Oral Daily PRN Emokpae, Ejiroghene E, MD      . sucralfate (CARAFATE) tablet 1 g  1 g Oral TID Emokpae, Ejiroghene E, MD   1 g at 04/23/20 1004   Current Outpatient Medications  Medication Sig Dispense Refill  . albuterol (PROAIR HFA) 108 (90 Base) MCG/ACT inhaler Inhale 2 puffs into the lungs every 4 (four) hours as needed.    Marland Kitchen aspirin EC 81 MG tablet Take 1 tablet (81 mg total) by mouth daily.    Marland Kitchen atorvastatin (LIPITOR) 40 MG tablet Take 1 tablet (40 mg total) by mouth daily.    Marland Kitchen BELBUCA 900 MCG FILM Take 1 Film by mouth 2 (two) times daily.    . betamethasone dipropionate (DIPROLENE) 0.05 % cream Apply 1 application topically 2 (two) times daily as needed (irritation).    Marland Kitchen doxycycline (VIBRAMYCIN) 100 MG capsule Take 1 capsule (100 mg total) by mouth 2 (two) times daily for 7 days. 14 capsule 0  . DULoxetine (CYMBALTA) 60 MG capsule Take 60 mg by mouth daily.     Marland Kitchen gabapentin (NEURONTIN) 100 MG capsule Take 100 mg by mouth 3 (three) times daily.    . hydrALAZINE (APRESOLINE) 25 MG tablet TAKE ONE TABLET BY MOUTH THREE TIMES DAILY 90 tablet 6  . levothyroxine (SYNTHROID) 100 MCG tablet Take 100 mcg by mouth daily.    Marland Kitchen lidocaine-prilocaine (EMLA) cream Apply 1 application topically as needed (before dialysis).    . nitroGLYCERIN (NITROSTAT) 0.4 MG SL tablet Place 1 tablet (0.4 mg total) under the tongue every 5 (five) minutes x 3 doses as needed for chest pain (If no relief after 3rd dose, proceed to the ED for an evaluation). I 25 tablet 0  . ondansetron (ZOFRAN) 4 MG tablet Take  4 mg by mouth every 8 (eight) hours as needed.    . OXYGEN Inhale 3.5 L into the lungs continuous.     . pregabalin (LYRICA) 100 MG capsule Take 1 capsule by mouth in the morning, at noon, and at bedtime.    . sucralfate (CARAFATE) 1 g tablet Take 1 g by mouth 3 (three) times daily.    . traMADol (ULTRAM) 50 MG tablet Take 50 mg by mouth 2 (two) times daily as needed.    Marland Kitchen  amoxicillin (AMOXIL) 875 MG tablet Take 875 mg by mouth 2 (two) times daily. (Patient not taking: Reported on 04/22/2020)    . carvedilol (COREG) 6.25 MG tablet Take 1 tablet by mouth in the morning and at bedtime. (Patient not taking: Reported on 04/22/2020)    . cloNIDine (CATAPRES) 0.1 MG tablet Take 0.1 mg by mouth daily. (Patient not taking: Reported on 04/22/2020)    . polyethylene glycol (MIRALAX / GLYCOLAX) 17 g packet Take 17 g by mouth as needed.  (Patient not taking: Reported on 04/22/2020)    . predniSONE (DELTASONE) 20 MG tablet Take 1 tablet (20 mg total) by mouth 2 (two) times daily. (Patient not taking: Reported on 04/22/2020) 10 tablet 0   Facility-Administered Medications Ordered in Other Encounters  Medication Dose Route Frequency Provider Last Rate Last Admin  . 0.9 %  sodium chloride infusion   Intravenous Continuous Monia Sabal, PA-C   New Bag at 03/08/18 0920  . ceFAZolin (ANCEF) 2 g in dextrose 5 % 100 mL IVPB  2 g Intravenous Once Monia Sabal, PA-C         Discharge Medications: Please see discharge summary for a list of discharge medications.  Relevant Imaging Results:  Relevant Lab Results:   Additional Information SSN: [id number]     Patient is a dialysis patient.  Natasha Bence, LCSW

## 2020-04-23 NOTE — ED Notes (Signed)
Pt O2 sats dropping down to 68% on 5L [place]. Pt woke up by staff and sats improved back to the mid 90's. Pt still refusing to wear CPAP, MD notified. Pt education provided with no improvements.

## 2020-04-23 NOTE — Procedures (Signed)
   HEMODIALYSIS TREATMENT NOTE:  4 hour heparin-free HD completed via LUE AVF (15g/antegrade). Goal met: 4 liters removed.  No interruption in UF.  All blood was returned and hemostasis was achieved in 30 minutes.  Lung sounds improved post-HD; otherwise, no changes from pre-HD assessment.  Rockwell Alexandria, RN.

## 2020-04-23 NOTE — ED Notes (Addendum)
Date and time results received: 04/23/20 0413  Test: potassium Critical Value: 6.4  Name of Provider Notified: Josephine Cables, MD  Orders Received? Or Actions Taken?: acknowledged

## 2020-04-23 NOTE — Plan of Care (Signed)
  Problem: Education: Goal: Knowledge of General Education information will improve Description Including pain rating scale, medication(s)/side effects and non-pharmacologic comfort measures Outcome: Progressing   Problem: Health Behavior/Discharge Planning: Goal: Ability to manage health-related needs will improve Outcome: Progressing   

## 2020-04-24 DIAGNOSIS — I5023 Acute on chronic systolic (congestive) heart failure: Secondary | ICD-10-CM | POA: Diagnosis not present

## 2020-04-24 LAB — GLUCOSE, CAPILLARY
Glucose-Capillary: 88 mg/dL (ref 70–99)
Glucose-Capillary: 72 mg/dL (ref 70–99)
Glucose-Capillary: 112 mg/dL — ABNORMAL HIGH (ref 70–99)
Glucose-Capillary: 82 mg/dL (ref 70–99)

## 2020-04-24 LAB — POTASSIUM: Potassium: 5.2 mmol/L — ABNORMAL HIGH (ref 3.5–5.1)

## 2020-04-24 LAB — BASIC METABOLIC PANEL
Anion gap: 10 (ref 5–15)
BUN: 35 mg/dL — ABNORMAL HIGH (ref 8–23)
CO2: 28 mmol/L (ref 22–32)
Calcium: 8.3 mg/dL — ABNORMAL LOW (ref 8.9–10.3)
Chloride: 95 mmol/L — ABNORMAL LOW (ref 98–111)
Creatinine, Ser: 3.28 mg/dL — ABNORMAL HIGH (ref 0.61–1.24)
GFR, Estimated: 20 mL/min — ABNORMAL LOW (ref >60)
Glucose, Bld: 94 mg/dL (ref 70–99)
Potassium: 5.4 mmol/L — ABNORMAL HIGH (ref 3.5–5.1)
Sodium: 133 mmol/L — ABNORMAL LOW (ref 135–145)

## 2020-04-24 LAB — HEMOGLOBIN A1C
Hgb A1c MFr Bld: 6.2 % — ABNORMAL HIGH (ref 4.8–5.6)
Mean Plasma Glucose: 131.24 mg/dL

## 2020-04-24 MED ORDER — CHLORHEXIDINE GLUCONATE CLOTH 2 % EX PADS
6.0000 | MEDICATED_PAD | Freq: Every day | CUTANEOUS | Status: DC
  Administered 2020-04-24 – 2020-04-25 (×2): 6 via TOPICAL

## 2020-04-24 MED ORDER — SODIUM CHLORIDE 0.9 % IV SOLN: 100.0000 mL | INTRAVENOUS | Status: DC | PRN

## 2020-04-24 MED ORDER — ALBUMIN HUMAN 25 % IV SOLN: 25.0000 g | Freq: Once | INTRAVENOUS | Status: DC

## 2020-04-24 NOTE — Progress Notes (Signed)
[facility] ROUNDING NOTE   Subjective:   Brief history: 64 year old gentleman with a history of end-stage renal disease ischemic cardiomyopathy with systolic dysfunction hypertension chronic respiratory failure with oxygen dependence.  Presented with right hip pain.  Was found to be hypokalemia with significant pulmonary vascular congestion.  His last dialysis treatment was 04/23/2020.  With 4 L removed.  He continues to be volume overloaded and will plan of additional dialysis treatment 04/24/2020  Blood pressure 122/59 pulse 78 temperature 98.2 O2 sats 93% 7 L of oxygen.  Sodium 133 potassium 5.4 chloride 95 CO2 28 BUN 35 creatinine 3.28 glucose 94 hemoglobin 8.6   Objective:  Vital signs in last 24 hours:  Temp:  [97.9 F (36.6 C)-98.2 F (36.8 C)] 98.2 F (36.8 C) (11/19 2149) Pulse Rate:  [78-98] 88 (11/19 2149) Resp:  [16-18] 18 (11/19 2149) BP: (102-153)/(55-76) 122/59 (11/19 2149) SpO2:  [90 %-94 %] 93 % (11/19 2149) Weight:  [111.7 kg-115.7 kg] 111.7 kg (11/20 0500)  Weight change: 6.7 kg Filed Weights   04/22/20 1144 04/23/20 1210 04/24/20 0500  Weight: 109 kg 115.7 kg 111.7 kg    Intake/Output: I/O last 3 completed shifts: In: 790 [P.O.:740; IV Piggyback:50] Out: 4000 [Other:4000]   Intake/Output this shift:  No intake/output data recorded.   General: well-appearing, no acute distress HEENT: anicteric sclera, MMM, +perioribital edema CV: normal rate, no murmurs Lungs: poor air exchange diffusely, on O2, unlabored, b/l chest expansion Abd: soft, non-tender, non-distended Skin: no visible lesions or rashes Msk: tight edema bilateral LE's  Neuro: normal speech, no gross focal deficits  Access: left AVF +b/t/patent   Basic Metabolic Panel: Recent Labs  Lab 04/17/20 1435 04/17/20 1435 04/22/20 1454 04/23/20 0345 04/23/20 0814 04/24/20 0043 04/24/20 0622  NA 134*  --  132* 133*  --   --  133*  K 5.0   < > 5.9* 6.4* 6.3* 5.2* 5.4*  CL  96*  --  94* 96*  --   --  95*  CO2 28  --  28 27  --   --  28  GLUCOSE 240*  --  137* 152*  --   --  94  BUN 43*  --  49* 51*  --   --  35*  CREATININE 4.23*  --  4.20* 4.41*  --   --  3.28*  CALCIUM 8.5*   < > 8.3* 8.4*  --   --  8.3*  MG 1.7  --   --   --   --   --   --    < > = values in this interval not displayed.    Liver Function Tests: Recent Labs  Lab 04/17/20 1435  AST 17  ALT 22  ALKPHOS 93  BILITOT 0.5  PROT 6.6  ALBUMIN 2.4*   No results for input(s): LIPASE, AMYLASE in the last 168 hours. No results for input(s): AMMONIA in the last 168 hours.  CBC: Recent Labs  Lab 04/17/20 1435 04/22/20 1454 04/23/20 0345  WBC 16.7* 13.8* 14.2*  NEUTROABS 16.0* 12.6*  --   HGB 8.8* 8.7* 8.6*  HCT 30.2* 30.1* 29.4*  MCV 95.0 95.9 93.6  PLT 309 251 246    Cardiac Enzymes: No results for input(s): CKTOTAL, CKMB, CKMBINDEX, TROPONINI in the last 168 hours.  BNP: Invalid input(s): POCBNP  CBG: Recent Labs  Lab 04/23/20 2206 04/24/20 0731  GLUCAP 141* 88    Microbiology: Results for orders placed or performed during the hospital encounter  of 04/22/20  Resp Panel by RT-PCR (Flu A&B, Covid) Nasopharyngeal Swab     Status: None   Collection Time: 04/22/20  8:10 PM   Specimen: Nasopharyngeal Swab; Nasopharyngeal(NP) swabs in vial transport medium  Result Value Ref Range Status   SARS Coronavirus 2 by RT PCR NEGATIVE NEGATIVE Final    Comment: (NOTE) SARS-CoV-2 target nucleic acids are NOT DETECTED.  The SARS-CoV-2 RNA is generally detectable in upper respiratory specimens during the acute phase of infection. The lowest concentration of SARS-CoV-2 viral copies this assay can detect is 138 copies/mL. A negative result does not preclude SARS-Cov-2 infection and should not be used as the sole basis for treatment or other patient management decisions. A negative result may occur with  improper specimen collection/handling, submission of specimen other than  nasopharyngeal swab, presence of viral mutation(s) within the areas targeted by this assay, and inadequate number of viral copies(<138 copies/mL). A negative result must be combined with clinical observations, patient history, and epidemiological information. The expected result is Negative.  Fact Sheet for Patients:  EntrepreneurPulse.com.au  Fact Sheet for Healthcare Providers:  IncredibleEmployment.be  This test is no t yet approved or cleared by the [place] FDA and  has been authorized for detection and/or diagnosis of SARS-CoV-2 by FDA under an Emergency Use Authorization (EUA). This EUA will remain  in effect (meaning this test can be used) for the duration of the COVID-19 declaration under Section 564(b)(1) of the Act, 21 U.S.C.section 360bbb-3(b)(1), unless the authorization is terminated  or revoked sooner.       Influenza A by PCR NEGATIVE NEGATIVE Final   Influenza B by PCR NEGATIVE NEGATIVE Final    Comment: (NOTE) The Xpert Xpress SARS-CoV-2/FLU/RSV plus assay is intended as an aid in the diagnosis of influenza from Nasopharyngeal swab specimens and should not be used as a sole basis for treatment. Nasal washings and aspirates are unacceptable for Xpert Xpress SARS-CoV-2/FLU/RSV testing.  Fact Sheet for Patients: EntrepreneurPulse.com.au  Fact Sheet for Healthcare Providers: IncredibleEmployment.be  This test is not yet approved or cleared by the [place] FDA and has been authorized for detection and/or diagnosis of SARS-CoV-2 by FDA under an Emergency Use Authorization (EUA). This EUA will remain in effect (meaning this test can be used) for the duration of the COVID-19 declaration under Section 564(b)(1) of the Act, 21 U.S.C. section 360bbb-3(b)(1), unless the authorization is terminated or revoked.  Performed at [facility], [address] [place], [place] [address]      Coagulation Studies: No results for input(s): LABPROT, INR in the last 72 hours.  Urinalysis: No results for input(s): COLORURINE, LABSPEC, PHURINE, GLUCOSEU, HGBUR, BILIRUBINUR, KETONESUR, PROTEINUR, UROBILINOGEN, NITRITE, LEUKOCYTESUR in the last 72 hours.  Invalid input(s): APPERANCEUR    Imaging: DG Chest Portable 1 View  Result Date: 04/22/2020 CLINICAL DATA:  Shortness of breath and weakness. Chronic renal failure EXAM: PORTABLE CHEST 1 VIEW COMPARISON:  April 17, 2020 FINDINGS: There is cardiomegaly with mild pulmonary venous hypertension. There is interstitial edema with small pleural effusions bilaterally. There is probable loculated effusion on the right laterally with atelectatic change. No airspace consolidation evident. No adenopathy. Degenerative change in each shoulder noted. IMPRESSION: Cardiomegaly with pulmonary vascular congestion. Pleural effusions and pulmonary edema. The appearance is consistent with a degree of volume overload/congestive heart failure. Suspect loculated effusion along the lateral right base with atelectatic change in this area. No consolidation. Electronically Signed   By: Lowella Grip III M.D.   On: 04/22/2020 14:54  DG Hip Port Unilat W or [place] Pelvis 1 View Left  Result Date: 04/22/2020 CLINICAL DATA:  Pain following fall EXAM: DG HIP (WITH OR WITHOUT PELVIS) 1V PORT LEFT COMPARISON:  None. FINDINGS: Frontal view left hip obtained. On frontal view, no fracture or dislocation is appreciable. There is mild narrowing of the left hip joint. No erosive change evident. There are multiple foci of arterial vascular calcification IMPRESSION: No fracture seen on frontal view. No dislocation. Mild narrowing left hip joint. Given history of trauma, correlation with lateral view may well be advisable for more precise assessment. Electronically Signed   By: Lowella Grip III M.D.   On: 04/22/2020 16:28   DG HIP UNILAT WITH PELVIS 2-3 VIEWS  LEFT  Result Date: 04/22/2020 CLINICAL DATA:  64 year old male with hip pain. EXAM: DG HIP (WITH OR WITHOUT PELVIS) 2-3V LEFT COMPARISON:  Left hip radiograph dated 04/22/2020. FINDINGS: Evaluation is very limited due to body habitus and osteopenia. No definite acute fracture identified. There is no dislocation. The bones are osteopenic. Extensive lower lumbar fixation hardware. The soft tissues are unremarkable. IMPRESSION: No definite acute fracture or dislocation. Electronically Signed   By: Anner Crete M.D.   On: 04/22/2020 21:34     Medications:    sodium chloride     sodium chloride     albumin human      aspirin EC  81 mg Oral Daily   atorvastatin  40 mg Oral Daily   Buprenorphine HCl  1 Film Oral BID   Chlorhexidine Gluconate Cloth  6 each Topical Q0600   Chlorhexidine Gluconate Cloth  6 each Topical Q0600   DULoxetine  60 mg Oral Daily   heparin  5,000 Units Subcutaneous Q8H   hydrALAZINE  25 mg Oral TID   insulin aspart  0-5 Units Subcutaneous QHS   insulin aspart  0-6 Units Subcutaneous TID WC   levothyroxine  100 mcg Oral Daily   sucralfate  1 g Oral TID   sodium chloride, sodium chloride, acetaminophen **OR** acetaminophen, albumin human, lidocaine (PF), lidocaine-prilocaine, ondansetron **OR** ondansetron (ZOFRAN) IV, pentafluoroprop-tetrafluoroeth, polyethylene glycol, traMADol  Assessment/ Plan:   ESRD-underwent dialysis 04/23/2020 will continue dialysis 04/24/2020 for significant volume overload.  Usual schedule Monday Wednesday Friday  ANEMIA-we will add darbepoetin and check iron studies  MBD-follow renal panel and adjust binders  HTN/VOL-significant volume overload vascular congestion ultrafilter to remove further volume.  ACCESS-left AV fistula  Diabetes mellitus as per primary service   LOS: [place] @TODAY @8 :39 AM

## 2020-04-24 NOTE — Procedures (Signed)
   HEMODIALYSIS TREATMENT NOTE:   4 hour heparin-free HD completed via LUE AVF (15g/antegrade).  We discussed complications of hyperkalemia and identified potassium-rich foods to limit / avoid.  Pt had a 20 oz. bottle of Gatorade on his bed table.  Goal met: 4.1 liters removed without interruption in UF.  Albumin 25g was given x1 for declining BP.  All blood was returned and hemostasis was achieved in 20 minutes.  Weaned from 7L O2 via HFNC to 3L.  Saturating 96%.    Rockwell Alexandria, RN

## 2020-04-24 NOTE — Plan of Care (Signed)
  Problem: Education: Goal: Knowledge of General Education information will improve Description Including pain rating scale, medication(s)/side effects and non-pharmacologic comfort measures Outcome: Progressing   Problem: Health Behavior/Discharge Planning: Goal: Ability to manage health-related needs will improve Outcome: Progressing   

## 2020-04-24 NOTE — Progress Notes (Signed)
Patient Demographics:    Mark [name], is a 64 y.o. male, DOB - Sep 19, 1955, BMW:[id number]  Admit date - 04/22/2020   Admitting Physician Reygan Heagle Denton Brick, MD  Outpatient Primary MD for the patient is Leeanne Rio, MD  LOS - 1  Chief Complaint  Patient presents with  . Admission        Subjective:    Mark [name] today has no fevers, no emesis,  No chest pain,   - --Tolerated hemodialysis sessions for 4 hours on 04/23/2020 -Dyspnea and hypoxia improving slowly  Assessment  & Plan :    Principal Problem:   Acute on chronic systolic CHF (congestive heart failure) (HCC) Active Problems:   Abnormal ECG   HTN (hypertension)   ESRD (end stage renal disease) on dialysis [facility])   Ischemic cardiomyopathy   Left hip pain   ESRD (end stage renal disease) ([facility])  Brief Summary:- 64 y.o. male with medical history significant for ESRD, ischemic cardiomyopathy with systolic heart failure, hypertension with chronic hypoxic respiratory failure on home O2--admitted on 04/22/2020 with worsening hypoxia and volume overload after missing hemodialysis sessions  A/p 1)Acute on chronic hypoxic respiratory failure--- at baseline patient is usually on 3 L of oxygen via nasal cannula -During this admission on 3 L patient's O2 sats dropped down to 86%, O2 sats were titrated up to 5 L/min to maintain O2 sats around 90% -Anticipate hypoxia will improve with hemodialysis to address volume overload status  2)ESRD--usually gets HD MWF missed HD on 04/21/2020-- -discussed with nephrology service plan is for serial/back-to-back hemodialysis sessions to address patient's volume overload status and worsening hypoxia -Patient had hyperkalemia, received Lokelma, hyperkalemia improved, but did not resolve after hemodialysis on 04/23/2020, plan is for additional hemodialysis session on 04/24/2020 to address volume status and  hyperkalemia   Medication Issue:- Preferred narcotic agents for pain control are hydromorphone, fentanyl, and methadone. Morphine should not be used.  Baclofen should be avoided Avoid oral sodium phosphate and magnesium citrate based laxatives / bowel preps    3) major depression--- - depressed mood due to marital strife and chronic health conditions -Evaluated by TTS and deemed not to be suicidal -Patient will need outpatient follow-up with behavioral health provider -Cymbalta ordered for chronic pain and mood disorder  4) chronic back pain/chronic pain syndrome--- continue PTA meds for pain control -Including buprenorphine, Cymbalta added  5) acute on chronic combined systolic and diastolic dysfunction CHF--last echo from August 2021 with EF of 40 to 45% and grade 2 diastolic dysfunction --Manage as above #1 #2 with hemodialysis  6)DM2-diet controlled, A1c 6.2 reflecting excellent diabetic control PTA Use Novolog/Humalog Sliding scale insulin with Accu-Cheks/Fingersticks as ordered   7) chronic anemia of ESRD--- hemoglobin is 8.6, -No evidence of ongoing bleeding, defer decision on ESA/EPO to nephrology team -Transfuse if clinically indicated  8)Social/Ethics--- previously had conversations with hospice, currently full code, awaiting additional palliative care consult for goals of care conversations  9) generalized weakness/recurrent falls--- PT eval appreciated recommends SNF rehab  Disposition/Need for in-Hospital Stay- patient unable to be discharged at this time due to -plan is for serial/back-to-back hemodialysis sessions to address patient's volume overload status and worsening hypoxia -  Status is: Inpatient  Remains inpatient appropriate because:plan is for serial/back-to-back  hemodialysis sessions to address patient's volume overload status and worsening hypoxia   Disposition: The patient is from: Home              Anticipated d/c is to: SNF              Anticipated  d/c date is: 2 days              Patient currently is not medically stable to d/c. Barriers: Not Clinically Stable- -plan is for serial/back-to-back hemodialysis sessions to address patient's volume overload status and worsening hypoxia  Code Status : Full code  Family Communication:  (patient is alert, awake and coherent) *   Consults  : Nephrology/palliative care  DVT Prophylaxis  :   - Heparin - SCDs   Lab Results  Component Value Date   PLT 246 04/23/2020    Inpatient Medications  Scheduled Meds: . aspirin EC  81 mg Oral Daily  . atorvastatin  40 mg Oral Daily  . Buprenorphine HCl  1 Film Oral BID  . Chlorhexidine Gluconate Cloth  6 each Topical Q0600  . Chlorhexidine Gluconate Cloth  6 each Topical Q0600  . DULoxetine  60 mg Oral Daily  . heparin  5,000 Units Subcutaneous Q8H  . hydrALAZINE  25 mg Oral TID  . insulin aspart  0-5 Units Subcutaneous QHS  . insulin aspart  0-6 Units Subcutaneous TID WC  . levothyroxine  100 mcg Oral Daily  . sucralfate  1 g Oral TID   Continuous Infusions: . sodium chloride    . sodium chloride    . albumin human     PRN Meds:.sodium chloride, sodium chloride, acetaminophen **OR** acetaminophen, albumin human, lidocaine (PF), lidocaine-prilocaine, ondansetron **OR** ondansetron (ZOFRAN) IV, pentafluoroprop-tetrafluoroeth, polyethylene glycol, traMADol    Anti-infectives (From admission, onward)   None        Objective:   Vitals:   04/23/20 1640 04/23/20 2000 04/23/20 2149 04/24/20 0500  BP: 120/63 120/60 (!) 122/59   Pulse: 89 90 88   Resp: 16 18 18    Temp: 98 F (36.7 C) 98 F (36.7 C) 98.2 F (36.8 C)   TempSrc: Oral Oral    SpO2: 94% 93% 93%   Weight:    111.7 kg  Height:        Wt Readings from Last 3 Encounters:  04/24/20 111.7 kg  04/17/20 102 kg  04/12/20 97.5 kg     Intake/Output Summary (Last 24 hours) at 04/24/2020 1131 Last data filed at 04/23/2020 1900 Gross per 24 hour  Intake 240 ml  Output  4000 ml  Net -3760 ml     Physical Exam  Gen:- Awake Alert, chronically ill-appearing HEENT:- .AT, No sclera icterus, periorbital edema is improving Neck-Supple Neck, +ve JVD,.  Lungs-diminished breath sounds, bibasilar rales CV- S1, S2 normal, regular  Abd-  +ve B.Sounds, Abd Soft, No tenderness,    Extremity/Skin:-2+ edema, pedal pulses present  Psych-affect is flat to depressed, oriented x3 Neuro-generalized weakness, no new focal deficits, no tremors   Data Review:   Micro Results Recent Results (from the past 240 hour(s))  Resp Panel by RT PCR (RSV, Flu A&B, Covid) - Nasopharyngeal Swab     Status: None   Collection Time: 04/17/20  6:18 PM   Specimen: Nasopharyngeal Swab  Result Value Ref Range Status   SARS Coronavirus 2 by RT PCR NEGATIVE NEGATIVE Final    Comment: (NOTE) SARS-CoV-2 target nucleic acids are NOT DETECTED.  The SARS-CoV-2 RNA is generally detectable  in upper respiratoy specimens during the acute phase of infection. The lowest concentration of SARS-CoV-2 viral copies this assay can detect is 131 copies/mL. A negative result does not preclude SARS-Cov-2 infection and should not be used as the sole basis for treatment or other patient management decisions. A negative result may occur with  improper specimen collection/handling, submission of specimen other than nasopharyngeal swab, presence of viral mutation(s) within the areas targeted by this assay, and inadequate number of viral copies (<131 copies/mL). A negative result must be combined with clinical observations, patient history, and epidemiological information. The expected result is Negative.  Fact Sheet for Patients:  PinkCheek.be  Fact Sheet for Healthcare Providers:  GravelBags.it  This test is no t yet approved or cleared by the [place] FDA and  has been authorized for detection and/or diagnosis of SARS-CoV-2 by FDA under  an Emergency Use Authorization (EUA). This EUA will remain  in effect (meaning this test can be used) for the duration of the COVID-19 declaration under Section 564(b)(1) of the Act, 21 U.S.C. section 360bbb-3(b)(1), unless the authorization is terminated or revoked sooner.     Influenza A by PCR NEGATIVE NEGATIVE Final   Influenza B by PCR NEGATIVE NEGATIVE Final    Comment: (NOTE) The Xpert Xpress SARS-CoV-2/FLU/RSV assay is intended as an aid in  the diagnosis of influenza from Nasopharyngeal swab specimens and  should not be used as a sole basis for treatment. Nasal washings and  aspirates are unacceptable for Xpert Xpress SARS-CoV-2/FLU/RSV  testing.  Fact Sheet for Patients: PinkCheek.be  Fact Sheet for Healthcare Providers: GravelBags.it  This test is not yet approved or cleared by the [place] FDA and  has been authorized for detection and/or diagnosis of SARS-CoV-2 by  FDA under an Emergency Use Authorization (EUA). This EUA will remain  in effect (meaning this test can be used) for the duration of the  Covid-19 declaration under Section 564(b)(1) of the Act, 21  U.S.C. section 360bbb-3(b)(1), unless the authorization is  terminated or revoked.    Respiratory Syncytial Virus by PCR NEGATIVE NEGATIVE Final    Comment: (NOTE) Fact Sheet for Patients: PinkCheek.be  Fact Sheet for Healthcare Providers: GravelBags.it  This test is not yet approved or cleared by the [place] FDA and  has been authorized for detection and/or diagnosis of SARS-CoV-2 by  FDA under an Emergency Use Authorization (EUA). This EUA will remain  in effect (meaning this test can be used) for the duration of the  COVID-19 declaration under Section 564(b)(1) of the Act, 21 U.S.C.  section 360bbb-3(b)(1), unless the authorization is terminated or  revoked. Performed at [facility], [address] [place], [place] [address]   Resp Panel by RT-PCR (Flu A&B, Covid) Nasopharyngeal Swab     Status: None   Collection Time: 04/22/20  8:10 PM   Specimen: Nasopharyngeal Swab; Nasopharyngeal(NP) swabs in vial transport medium  Result Value Ref Range Status   SARS Coronavirus 2 by RT PCR NEGATIVE NEGATIVE Final    Comment: (NOTE) SARS-CoV-2 target nucleic acids are NOT DETECTED.  The SARS-CoV-2 RNA is generally detectable in upper respiratory specimens during the acute phase of infection. The lowest concentration of SARS-CoV-2 viral copies this assay can detect is 138 copies/mL. A negative result does not preclude SARS-Cov-2 infection and should not be used as the sole basis for treatment or other patient management decisions. A negative result may occur with  improper specimen collection/handling, submission of specimen other than nasopharyngeal swab, presence  of viral mutation(s) within the areas targeted by this assay, and inadequate number of viral copies(<138 copies/mL). A negative result must be combined with clinical observations, patient history, and epidemiological information. The expected result is Negative.  Fact Sheet for Patients:  EntrepreneurPulse.com.au  Fact Sheet for Healthcare Providers:  IncredibleEmployment.be  This test is no t yet approved or cleared by the [place] FDA and  has been authorized for detection and/or diagnosis of SARS-CoV-2 by FDA under an Emergency Use Authorization (EUA). This EUA will remain  in effect (meaning this test can be used) for the duration of the COVID-19 declaration under Section 564(b)(1) of the Act, 21 U.S.C.section 360bbb-3(b)(1), unless the authorization is terminated  or revoked sooner.       Influenza A by PCR NEGATIVE NEGATIVE Final   Influenza B by PCR NEGATIVE NEGATIVE Final    Comment: (NOTE) The Xpert Xpress SARS-CoV-2/FLU/RSV plus assay is intended as  an aid in the diagnosis of influenza from Nasopharyngeal swab specimens and should not be used as a sole basis for treatment. Nasal washings and aspirates are unacceptable for Xpert Xpress SARS-CoV-2/FLU/RSV testing.  Fact Sheet for Patients: EntrepreneurPulse.com.au  Fact Sheet for Healthcare Providers: IncredibleEmployment.be  This test is not yet approved or cleared by the [place] FDA and has been authorized for detection and/or diagnosis of SARS-CoV-2 by FDA under an Emergency Use Authorization (EUA). This EUA will remain in effect (meaning this test can be used) for the duration of the COVID-19 declaration under Section 564(b)(1) of the Act, 21 U.S.C. section 360bbb-3(b)(1), unless the authorization is terminated or revoked.  Performed at [facility], [address], [place], [place] [address]     Radiology Reports DG Chest 2 View  Result Date: 04/17/2020 CLINICAL DATA:  Shortness of breath EXAM: CHEST - 2 VIEW COMPARISON:  April 12, 2020 FINDINGS: The cardiomediastinal silhouette is unchanged and enlarged in contour.Perihilar vascular fullness, unchanged. Small bilateral pleural effusions, similar comparison to prior. No pneumothorax. Diffuse interstitial prominence. Patchy bibasilar heterogeneous opacities, unchanged. Visualized abdomen is unremarkable. Multilevel degenerative changes of the thoracic spine. Degenerative changes of the RIGHT acromioclavicular joint. IMPRESSION: Constellation of findings are favored to reflect a combination of pulmonary edema and atelectasis. Superimposed infection remains in the differential. Electronically Signed   By: Valentino Saxon MD   On: 04/17/2020 14:59   CT ABDOMEN PELVIS W CONTRAST  Result Date: 04/12/2020 CLINICAL DATA:  63 year old male with flank pain and left hip pain for 2 days. No known injury. In stage renal disease on dialysis since 2016. EXAM: CT ABDOMEN AND PELVIS WITH CONTRAST  TECHNIQUE: Multidetector CT imaging of the abdomen and pelvis was performed using the standard protocol following bolus administration of intravenous contrast. CONTRAST:  148mL OMNIPAQUE IOHEXOL 300 MG/ML  SOLN COMPARISON:  [facility] CT Abdomen and Pelvis 03/31/2020 and earlier. FINDINGS: Lower chest: Stable cardiomegaly. No pericardial effusion. Calcified coronary artery and aortic atherosclerosis. Mildly increased bilateral lung base pleural effusions which appear mildly loculated. Associated increased lung base opacity which is mostly enhancing and more resembles atelectasis than pneumonia. Hepatobiliary: Chronically absent gallbladder. Geographic decreased liver density in keeping with hepatic steatosis. Pancreas: Negative aside from dystrophic calcifications of the pancreatic head and uncinate, stable. Spleen: Stable spleen size at the upper limits of normal. Several punctate calcified granuloma are stable. Adrenals/Urinary Tract: Adrenal glands remain within normal limits. Stable kidneys with chronic renal atrophy, small bilateral renal cysts, renal vascular calcifications. Superimposed bilateral collecting system calculi, including a 7 mm diameter  calculus unchanged within the left renal pelvis (series 6, image 64) and a smaller more amorphous calcification at the junction of the right renal pelvis and upper pole calyx (series 6, image 70). No subsequent hydronephrosis, but left pararenal stranding and soft tissue edema has increased since last month (series 2, image 44). Decreased renal enhancement in the setting of renal failure limits evaluation for acute kidney infection. Streak artifact from lumbar hardware limits visualization of the left ureter but both ureters appear to remain decompressed. Stable, unremarkable urinary bladder. Stomach/Bowel: Increased moderate retained stool in the rectosigmoid colon including a moderate stool ball in the rectum (series 2, image 79). Stable retained  stool elsewhere throughout redundant large bowel from the CT last month. The cecum is on a lax mesentery located in the right upper quadrant just below the liver edge. No large bowel inflammation identified. Negative terminal ileum. Appendix not delineated. No dilated small bowel. Oral contrast not administered today. Decompressed stomach and duodenum. No free air, free fluid. Vascular/Lymphatic: Extensive Aortoiliac calcified atherosclerosis. Partially calcified chronic infrarenal aortic soft plaque or thrombus (series 2, image 37). Major arterial structures remain patent despite atherosclerosis. Portal venous system is grossly patent on delayed images. Stable lymph nodes, upper limits of normal in the retroperitoneum. Reproductive: Negative. Other: Presacral stranding is stable from last month. No pelvic free fluid. Musculoskeletal: Substantially increased asymmetric subcutaneous edema in the visible lower chest, abdomen and pelvis. Confluent soft tissue edema in the right lower chest wall laterally (series 2, image 22). No soft tissue gas. No organized fluid collection. Abnormal bone mineralization compatible with renal osteodystrophy is stable. Superimposed multilevel lower lumbar fusion sequelae. Stable hardware. No paraspinal soft tissue inflammation identified. No acute osseous abnormality identified. IMPRESSION: 1. Increased left pararenal soft tissue edema since last month with a chronic 7 mm calculus at the left renal pelvis but no hydronephrosis at this time. This might reflect intermittent renal obstruction. Decreased renal enhancement in the setting of chronic renal failure limits the sensitivity for acute pyelonephritis. 2. Substantially increased body wall edema since last month. Small partially loculated lung base pleural effusions have increased along with bilateral lower lobe opacity favored to be atelectasis. 3. Stable cardiomegaly. Calcified coronary artery and Aortic Atherosclerosis  (ICD10-I70.0). Increased retained stool in redundant large bowel. Renal osteodystrophy. Hepatic steatosis. Mild chronic pancreatic calcifications. Electronically Signed   By: Genevie Ann M.D.   On: 04/12/2020 15:17   DG Chest Portable 1 View  Result Date: 04/22/2020 CLINICAL DATA:  Shortness of breath and weakness. Chronic renal failure EXAM: PORTABLE CHEST 1 VIEW COMPARISON:  April 17, 2020 FINDINGS: There is cardiomegaly with mild pulmonary venous hypertension. There is interstitial edema with small pleural effusions bilaterally. There is probable loculated effusion on the right laterally with atelectatic change. No airspace consolidation evident. No adenopathy. Degenerative change in each shoulder noted. IMPRESSION: Cardiomegaly with pulmonary vascular congestion. Pleural effusions and pulmonary edema. The appearance is consistent with a degree of volume overload/congestive heart failure. Suspect loculated effusion along the lateral right base with atelectatic change in this area. No consolidation. Electronically Signed   By: Lowella Grip III M.D.   On: 04/22/2020 14:54   DG Chest Port 1 View  Result Date: 04/12/2020 CLINICAL DATA:  Chest pain. Fell 2 days ago. History of congestive heart failure. EXAM: PORTABLE CHEST 1 VIEW COMPARISON:  01/09/2020 FINDINGS: Chronic cardiomegaly. Pulmonary venous hypertension. Interstitial and early alveolar pulmonary edema. Worsened density in the lower lungs which could simply be secondary to congestive heart  failure. Basilar pneumonia not excluded. Inferior left lung excluded from the film. IMPRESSION: Congestive heart failure with pulmonary edema. Worsened density in the lower lungs which could simply be secondary to congestive heart failure. Basilar pneumonia not excluded. Electronically Signed   By: Nelson Chimes M.D.   On: 04/12/2020 15:24   DG Hip Port Unilat W or Wo Pelvis 1 View Left  Result Date: 04/22/2020 CLINICAL DATA:  Pain following fall EXAM: DG  HIP (WITH OR WITHOUT PELVIS) 1V PORT LEFT COMPARISON:  None. FINDINGS: Frontal view left hip obtained. On frontal view, no fracture or dislocation is appreciable. There is mild narrowing of the left hip joint. No erosive change evident. There are multiple foci of arterial vascular calcification IMPRESSION: No fracture seen on frontal view. No dislocation. Mild narrowing left hip joint. Given history of trauma, correlation with lateral view may well be advisable for more precise assessment. Electronically Signed   By: Lowella Grip III M.D.   On: 04/22/2020 16:28   DG HIP UNILAT WITH PELVIS 2-3 VIEWS LEFT  Result Date: 04/22/2020 CLINICAL DATA:  64 year old male with hip pain. EXAM: DG HIP (WITH OR WITHOUT PELVIS) 2-3V LEFT COMPARISON:  Left hip radiograph dated 04/22/2020. FINDINGS: Evaluation is very limited due to body habitus and osteopenia. No definite acute fracture identified. There is no dislocation. The bones are osteopenic. Extensive lower lumbar fixation hardware. The soft tissues are unremarkable. IMPRESSION: No definite acute fracture or dislocation. Electronically Signed   By: Anner Crete M.D.   On: 04/22/2020 21:34     CBC Recent Labs  Lab 04/17/20 1435 04/22/20 1454 04/23/20 0345  WBC 16.7* 13.8* 14.2*  HGB 8.8* 8.7* 8.6*  HCT 30.2* 30.1* 29.4*  PLT 309 251 246  MCV 95.0 95.9 93.6  MCH 27.7 27.7 27.4  MCHC 29.1* 28.9* 29.3*  RDW 16.7* 17.0* 17.2*  LYMPHSABS 0.2* 0.3*  --   MONOABS 0.4 0.8  --   EOSABS 0.0 0.1  --   BASOSABS 0.0 0.0  --     Chemistries  Recent Labs  Lab 04/17/20 1435 04/17/20 1435 04/22/20 1454 04/23/20 0345 04/23/20 0814 04/24/20 0043 04/24/20 0622  NA 134*  --  132* 133*  --   --  133*  K 5.0   < > 5.9* 6.4* 6.3* 5.2* 5.4*  CL 96*  --  94* 96*  --   --  95*  CO2 28  --  28 27  --   --  28  GLUCOSE 240*  --  137* 152*  --   --  94  BUN 43*  --  49* 51*  --   --  35*  CREATININE 4.23*  --  4.20* 4.41*  --   --  3.28*  CALCIUM 8.5*   --  8.3* 8.4*  --   --  8.3*  MG 1.7  --   --   --   --   --   --   AST 17  --   --   --   --   --   --   ALT 22  --   --   --   --   --   --   ALKPHOS 93  --   --   --   --   --   --   BILITOT 0.5  --   --   --   --   --   --    < > = values in this  interval not displayed.   ------------------------------------------------------------------------------------------------------------------ No results for input(s): CHOL, HDL, LDLCALC, TRIG, CHOLHDL, LDLDIRECT in the last 72 hours.  Lab Results  Component Value Date   HGBA1C 6.2 (H) 04/24/2020   ------------------------------------------------------------------------------------------------------------------ No results for input(s): TSH, T4TOTAL, T3FREE, THYROIDAB in the last 72 hours.  Invalid input(s): FREET3 ------------------------------------------------------------------------------------------------------------------ No results for input(s): VITAMINB12, FOLATE, FERRITIN, TIBC, IRON, RETICCTPCT in the last 72 hours.  Coagulation profile No results for input(s): INR, PROTIME in the last 168 hours.  No results for input(s): DDIMER in the last 72 hours.  Cardiac Enzymes No results for input(s): CKMB, TROPONINI, MYOGLOBIN in the last 168 hours.  Invalid input(s): CK ------------------------------------------------------------------------------------------------------------------    Component Value Date/Time   BNP 3,562.0 (H) 04/12/2020 1356    Roxan Hockey M.D on 04/24/2020 at 11:31 AM  Go to [email or web address] - for contact info  [facility] - Office  [phone]

## 2020-04-25 ENCOUNTER — Encounter (HOSPITAL_COMMUNITY): Payer: Self-pay | Admitting: Family Medicine

## 2020-04-25 DIAGNOSIS — I5023 Acute on chronic systolic (congestive) heart failure: Secondary | ICD-10-CM | POA: Diagnosis not present

## 2020-04-25 LAB — GLUCOSE, CAPILLARY
Glucose-Capillary: 97 mg/dL (ref 70–99)
Glucose-Capillary: 108 mg/dL — ABNORMAL HIGH (ref 70–99)
Glucose-Capillary: 85 mg/dL (ref 70–99)
Glucose-Capillary: 109 mg/dL — ABNORMAL HIGH (ref 70–99)

## 2020-04-25 MED ORDER — OXYCODONE-ACETAMINOPHEN 5-325 MG PO TABS: 1.0000 | ORAL_TABLET | Freq: Three times a day (TID) | ORAL | Status: DC | PRN

## 2020-04-25 MED ORDER — CHLORHEXIDINE GLUCONATE CLOTH 2 % EX PADS
6.0000 | MEDICATED_PAD | Freq: Every day | CUTANEOUS | Status: DC
  Administered 2020-04-25: 6 via TOPICAL

## 2020-04-25 MED ORDER — OXYCODONE-ACETAMINOPHEN 5-325 MG PO TABS
1.0000 | ORAL_TABLET | Freq: Two times a day (BID) | ORAL | Status: DC | PRN
  Administered 2020-04-25 – 2020-04-27 (×4): 1 via ORAL
  Filled 2020-04-25 (×4): qty 1

## 2020-04-25 MED ORDER — BUPRENORPHINE HCL-NALOXONE HCL 2-0.5 MG SL SUBL
2.0000 | SUBLINGUAL_TABLET | Freq: Every day | SUBLINGUAL | Status: AC
  Administered 2020-04-25: 2 via SUBLINGUAL
  Filled 2020-04-25: qty 2

## 2020-04-25 NOTE — Progress Notes (Signed)
Patient Demographics:    Mark [name], is a 64 y.o. male, DOB - Jun 08, 1955, BZJ:[id number]  Admit date - 04/22/2020   Admitting Physician Ambyr Qadri Denton Brick, MD  Outpatient Primary MD for the patient is Leeanne Rio, MD  LOS - 2  Chief Complaint  Patient presents with  . Admission        Subjective:    Mark [name] today has no fevers, no emesis,  No chest pain,   - --Complains of generalized pain -Tolerated HD session well on 04/24/2020 except for hypotension requiring albumin infusion -Hypoxia and dyspnea has improved significantly after back-to-back hemodialysis sessions -awaiting transfer to SNF facility-  Assessment  & Plan :    Principal Problem:   Acute on chronic systolic CHF (congestive heart failure) (HCC) Active Problems:   Abnormal ECG   HTN (hypertension)   ESRD (end stage renal disease) on dialysis [facility])   Ischemic cardiomyopathy   Left hip pain   ESRD (end stage renal disease) ([place])  Brief Summary:- 64 y.o. male with medical history significant for ESRD, ischemic cardiomyopathy with systolic heart failure, hypertension with chronic hypoxic respiratory failure on home O2--admitted on 04/22/2020 with worsening hypoxia and volume overload after missing hemodialysis sessions  A/p 1)Acute on chronic hypoxic respiratory failure--- at baseline patient is usually on 3 L of oxygen via nasal cannula -During this admission on 3 L patient's O2 sats dropped down to 86%, O2 sats were titrated up to 7L/min to maintain O2 sats around 90% -After serial/back-to-back hemodialysis sessions with fluid removal on 04/23/2020 and 04/24/2020 pt's acute on chronic hypoxic respiratory failure improved --patient has been titrated down back to 3 L of nasal cannula from 7 L  2)ESRD--usually gets HD MWF missed HD on 04/21/2020-- -discussed with nephrology service pt had  serial/back-to-back  hemodialysis sessions on 04/23/2020 AND 04/24/20,  next HD session is planned for 04/26/2020 Hyperkalemia resolved after Lokelma and back-to-back hemodialysis sessions,  --  Medication Issue:- Preferred narcotic agents for pain control are hydromorphone, fentanyl, and methadone. Morphine should not be used.  Baclofen should be avoided Avoid oral sodium phosphate and magnesium citrate based laxatives / bowel preps    3) major depression--- - depressed mood due to marital strife and chronic health conditions -Evaluated by TTS and deemed not to be suicidal -Patient will need outpatient follow-up with behavioral health provider -Cymbalta ordered for chronic pain and mood disorder  4) chronic back pain/chronic pain syndrome--- continue PTA meds for pain control -Including buprenorphine, Cymbalta added -Stop tramadol, okay to use Percocet-every 12 hours as needed breakthrough pain  5) acute on chronic combined systolic and diastolic dysfunction CHF--last echo from August 2021 with EF of 40 to 45% and grade 2 diastolic dysfunction --Manage as above #1 #2 with hemodialysis  6)DM2-diet controlled, A1c 6.2 reflecting excellent diabetic control PTA Use Novolog/Humalog Sliding scale insulin with Accu-Cheks/Fingersticks as ordered   7) chronic anemia of ESRD--- hemoglobin is 8.6, -No evidence of ongoing bleeding, defer decision on ESA/EPO to nephrology team -Transfuse if clinically indicated  8)Social/Ethics--- previously had conversations with hospice, currently full code, awaiting additional palliative care consult for goals of care conversations  9) generalized weakness/recurrent falls--- PT eval appreciated recommends SNF rehab  Disposition/Need for in-Hospital Stay- patient unable to  be discharged at this time due to --awaiting transfer to SNF facility-  Status is: Inpatient  Remains inpatient appropriate because:awaiting transfer to SNF facility-   Disposition: The patient is from:  Home              Anticipated d/c is to: SNF              Anticipated d/c date is: 1 day              Patient currently is medically stable to d/c. Barriers: Not Clinically Stable- -awaiting transfer to SNF facility- Code Status : Full code  Family Communication:  (patient is alert, awake and coherent) *   Consults  : Nephrology/palliative care  DVT Prophylaxis  :   - Heparin - SCDs   Lab Results  Component Value Date   PLT 246 04/23/2020    Inpatient Medications  Scheduled Meds: . aspirin EC  81 mg Oral Daily  . atorvastatin  40 mg Oral Daily  . Buprenorphine HCl  1 Film Oral BID  . Chlorhexidine Gluconate Cloth  6 each Topical Q0600  . Chlorhexidine Gluconate Cloth  6 each Topical Q0600  . Chlorhexidine Gluconate Cloth  6 each Topical Q0600  . DULoxetine  60 mg Oral Daily  . heparin  5,000 Units Subcutaneous Q8H  . hydrALAZINE  25 mg Oral TID  . insulin aspart  0-5 Units Subcutaneous QHS  . insulin aspart  0-6 Units Subcutaneous TID WC  . levothyroxine  100 mcg Oral Daily  . sucralfate  1 g Oral TID   Continuous Infusions: . sodium chloride    . sodium chloride    . sodium chloride    . sodium chloride    . albumin human 25 g (04/24/20 1650)   PRN Meds:.sodium chloride, sodium chloride, sodium chloride, sodium chloride, acetaminophen **OR** acetaminophen, albumin human, lidocaine (PF), lidocaine-prilocaine, ondansetron **OR** ondansetron (ZOFRAN) IV, oxyCODONE-acetaminophen, pentafluoroprop-tetrafluoroeth, polyethylene glycol    Anti-infectives (From admission, onward)   None        Objective:   Vitals:   04/24/20 2015 04/24/20 2110 04/25/20 0500 04/25/20 0630  BP: 122/72 128/66  115/63  Pulse: 90 91  91  Resp: 18 18  18   Temp: 98 F (36.7 C) 98.2 F (36.8 C)  98.2 F (36.8 C)  TempSrc: Oral Oral  Oral  SpO2: 96% 93%  91%  Weight:   106.7 kg   Height:        Wt Readings from Last 3 Encounters:  04/25/20 106.7 kg  04/17/20 102 kg  04/12/20 97.5  kg     Intake/Output Summary (Last 24 hours) at 04/25/2020 1028 Last data filed at 04/25/2020 1018 Gross per 24 hour  Intake 960 ml  Output 4144 ml  Net -3184 ml     Physical Exam  Gen:- Awake Alert, chronically ill-appearing HEENT:- [place].AT, No sclera icterus, much improved periorbital edema is improving Neck-Supple Neck, +ve JVD,.  Lungs-improving air movement, no wheezing CV- S1, S2 normal, regular  Abd-  +ve B.Sounds, Abd Soft, No tenderness,    Extremity/Skin:- improving pitting edema, pedal pulses present  Psych-affect is flat, oriented x3 Neuro-generalized weakness, no new focal deficits, no tremors MSK-left arm AV fistula with positive thrill and bruit   Data Review:   Micro Results Recent Results (from the past 240 hour(s))  Resp Panel by RT PCR (RSV, Flu A&B, Covid) - Nasopharyngeal Swab     Status: None   Collection Time: 04/17/20  6:18 PM  Specimen: Nasopharyngeal Swab  Result Value Ref Range Status   SARS Coronavirus 2 by RT PCR NEGATIVE NEGATIVE Final    Comment: (NOTE) SARS-CoV-2 target nucleic acids are NOT DETECTED.  The SARS-CoV-2 RNA is generally detectable in upper respiratoy specimens during the acute phase of infection. The lowest concentration of SARS-CoV-2 viral copies this assay can detect is 131 copies/mL. A negative result does not preclude SARS-Cov-2 infection and should not be used as the sole basis for treatment or other patient management decisions. A negative result may occur with  improper specimen collection/handling, submission of specimen other than nasopharyngeal swab, presence of viral mutation(s) within the areas targeted by this assay, and inadequate number of viral copies (<131 copies/mL). A negative result must be combined with clinical observations, patient history, and epidemiological information. The expected result is Negative.  Fact Sheet for Patients:  PinkCheek.be  Fact Sheet for  Healthcare Providers:  GravelBags.it  This test is no t yet approved or cleared by the [place] FDA and  has been authorized for detection and/or diagnosis of SARS-CoV-2 by FDA under an Emergency Use Authorization (EUA). This EUA will remain  in effect (meaning this test can be used) for the duration of the COVID-19 declaration under Section 564(b)(1) of the Act, 21 U.S.C. section 360bbb-3(b)(1), unless the authorization is terminated or revoked sooner.     Influenza A by PCR NEGATIVE NEGATIVE Final   Influenza B by PCR NEGATIVE NEGATIVE Final    Comment: (NOTE) The Xpert Xpress SARS-CoV-2/FLU/RSV assay is intended as an aid in  the diagnosis of influenza from Nasopharyngeal swab specimens and  should not be used as a sole basis for treatment. Nasal washings and  aspirates are unacceptable for Xpert Xpress SARS-CoV-2/FLU/RSV  testing.  Fact Sheet for Patients: PinkCheek.be  Fact Sheet for Healthcare Providers: GravelBags.it  This test is not yet approved or cleared by the [place] FDA and  has been authorized for detection and/or diagnosis of SARS-CoV-2 by  FDA under an Emergency Use Authorization (EUA). This EUA will remain  in effect (meaning this test can be used) for the duration of the  Covid-19 declaration under Section 564(b)(1) of the Act, 21  U.S.C. section 360bbb-3(b)(1), unless the authorization is  terminated or revoked.    Respiratory Syncytial Virus by PCR NEGATIVE NEGATIVE Final    Comment: (NOTE) Fact Sheet for Patients: PinkCheek.be  Fact Sheet for Healthcare Providers: GravelBags.it  This test is not yet approved or cleared by the [place] FDA and  has been authorized for detection and/or diagnosis of SARS-CoV-2 by  FDA under an Emergency Use Authorization (EUA). This EUA will remain  in effect  (meaning this test can be used) for the duration of the  COVID-19 declaration under Section 564(b)(1) of the Act, 21 U.S.C.  section 360bbb-3(b)(1), unless the authorization is terminated or  revoked. Performed at [facility], [address], [place], [place] [address]   Resp Panel by RT-PCR (Flu A&B, Covid) Nasopharyngeal Swab     Status: None   Collection Time: 04/22/20  8:10 PM   Specimen: Nasopharyngeal Swab; Nasopharyngeal(NP) swabs in vial transport medium  Result Value Ref Range Status   SARS Coronavirus 2 by RT PCR NEGATIVE NEGATIVE Final    Comment: (NOTE) SARS-CoV-2 target nucleic acids are NOT DETECTED.  The SARS-CoV-2 RNA is generally detectable in upper respiratory specimens during the acute phase of infection. The lowest concentration of SARS-CoV-2 viral copies this assay can detect is 138 copies/mL. A negative result  does not preclude SARS-Cov-2 infection and should not be used as the sole basis for treatment or other patient management decisions. A negative result may occur with  improper specimen collection/handling, submission of specimen other than nasopharyngeal swab, presence of viral mutation(s) within the areas targeted by this assay, and inadequate number of viral copies(<138 copies/mL). A negative result must be combined with clinical observations, patient history, and epidemiological information. The expected result is Negative.  Fact Sheet for Patients:  EntrepreneurPulse.com.au  Fact Sheet for Healthcare Providers:  IncredibleEmployment.be  This test is no t yet approved or cleared by the [place] FDA and  has been authorized for detection and/or diagnosis of SARS-CoV-2 by FDA under an Emergency Use Authorization (EUA). This EUA will remain  in effect (meaning this test can be used) for the duration of the COVID-19 declaration under Section 564(b)(1) of the Act, 21 U.S.C.section 360bbb-3(b)(1), unless the  authorization is terminated  or revoked sooner.       Influenza A by PCR NEGATIVE NEGATIVE Final   Influenza B by PCR NEGATIVE NEGATIVE Final    Comment: (NOTE) The Xpert Xpress SARS-CoV-2/FLU/RSV plus assay is intended as an aid in the diagnosis of influenza from Nasopharyngeal swab specimens and should not be used as a sole basis for treatment. Nasal washings and aspirates are unacceptable for Xpert Xpress SARS-CoV-2/FLU/RSV testing.  Fact Sheet for Patients: EntrepreneurPulse.com.au  Fact Sheet for Healthcare Providers: IncredibleEmployment.be  This test is not yet approved or cleared by the [place] FDA and has been authorized for detection and/or diagnosis of SARS-CoV-2 by FDA under an Emergency Use Authorization (EUA). This EUA will remain in effect (meaning this test can be used) for the duration of the COVID-19 declaration under Section 564(b)(1) of the Act, 21 U.S.C. section 360bbb-3(b)(1), unless the authorization is terminated or revoked.  Performed at [facility] [address] [place], [place] [address]     Radiology Reports DG Chest 2 View  Result Date: 04/17/2020 CLINICAL DATA:  Shortness of breath EXAM: CHEST - 2 VIEW COMPARISON:  April 12, 2020 FINDINGS: The cardiomediastinal silhouette is unchanged and enlarged in contour.Perihilar vascular fullness, unchanged. Small bilateral pleural effusions, similar comparison to prior. No pneumothorax. Diffuse interstitial prominence. Patchy bibasilar heterogeneous opacities, unchanged. Visualized abdomen is unremarkable. Multilevel degenerative changes of the thoracic spine. Degenerative changes of the RIGHT acromioclavicular joint. IMPRESSION: Constellation of findings are favored to reflect a combination of pulmonary edema and atelectasis. Superimposed infection remains in the differential. Electronically Signed   By: Valentino Saxon MD   On: 04/17/2020 14:59   CT ABDOMEN  PELVIS W CONTRAST  Result Date: 04/12/2020 CLINICAL DATA:  64 year old male with flank pain and left hip pain for 2 days. No known injury. In stage renal disease on dialysis since 2016. EXAM: CT ABDOMEN AND PELVIS WITH CONTRAST TECHNIQUE: Multidetector CT imaging of the abdomen and pelvis was performed using the standard protocol following bolus administration of intravenous contrast. CONTRAST:  147mL OMNIPAQUE IOHEXOL 300 MG/ML  SOLN COMPARISON:  [facility] CT Abdomen and Pelvis 03/31/2020 and earlier. FINDINGS: Lower chest: Stable cardiomegaly. No pericardial effusion. Calcified coronary artery and aortic atherosclerosis. Mildly increased bilateral lung base pleural effusions which appear mildly loculated. Associated increased lung base opacity which is mostly enhancing and more resembles atelectasis than pneumonia. Hepatobiliary: Chronically absent gallbladder. Geographic decreased liver density in keeping with hepatic steatosis. Pancreas: Negative aside from dystrophic calcifications of the pancreatic head and uncinate, stable. Spleen: Stable spleen size at the upper limits  of normal. Several punctate calcified granuloma are stable. Adrenals/Urinary Tract: Adrenal glands remain within normal limits. Stable kidneys with chronic renal atrophy, small bilateral renal cysts, renal vascular calcifications. Superimposed bilateral collecting system calculi, including a 7 mm diameter calculus unchanged within the left renal pelvis (series 6, image 64) and a smaller more amorphous calcification at the junction of the right renal pelvis and upper pole calyx (series 6, image 70). No subsequent hydronephrosis, but left pararenal stranding and soft tissue edema has increased since last month (series 2, image 44). Decreased renal enhancement in the setting of renal failure limits evaluation for acute kidney infection. Streak artifact from lumbar hardware limits visualization of the left ureter but both ureters  appear to remain decompressed. Stable, unremarkable urinary bladder. Stomach/Bowel: Increased moderate retained stool in the rectosigmoid colon including a moderate stool ball in the rectum (series 2, image 79). Stable retained stool elsewhere throughout redundant large bowel from the CT last month. The cecum is on a lax mesentery located in the right upper quadrant just below the liver edge. No large bowel inflammation identified. Negative terminal ileum. Appendix not delineated. No dilated small bowel. Oral contrast not administered today. Decompressed stomach and duodenum. No free air, free fluid. Vascular/Lymphatic: Extensive Aortoiliac calcified atherosclerosis. Partially calcified chronic infrarenal aortic soft plaque or thrombus (series 2, image 37). Major arterial structures remain patent despite atherosclerosis. Portal venous system is grossly patent on delayed images. Stable lymph nodes, upper limits of normal in the retroperitoneum. Reproductive: Negative. Other: Presacral stranding is stable from last month. No pelvic free fluid. Musculoskeletal: Substantially increased asymmetric subcutaneous edema in the visible lower chest, abdomen and pelvis. Confluent soft tissue edema in the right lower chest wall laterally (series 2, image 22). No soft tissue gas. No organized fluid collection. Abnormal bone mineralization compatible with renal osteodystrophy is stable. Superimposed multilevel lower lumbar fusion sequelae. Stable hardware. No paraspinal soft tissue inflammation identified. No acute osseous abnormality identified. IMPRESSION: 1. Increased left pararenal soft tissue edema since last month with a chronic 7 mm calculus at the left renal pelvis but no hydronephrosis at this time. This might reflect intermittent renal obstruction. Decreased renal enhancement in the setting of chronic renal failure limits the sensitivity for acute pyelonephritis. 2. Substantially increased body wall edema since last  month. Small partially loculated lung base pleural effusions have increased along with bilateral lower lobe opacity favored to be atelectasis. 3. Stable cardiomegaly. Calcified coronary artery and Aortic Atherosclerosis (ICD10-I70.0). Increased retained stool in redundant large bowel. Renal osteodystrophy. Hepatic steatosis. Mild chronic pancreatic calcifications. Electronically Signed   By: Genevie Ann M.D.   On: 04/12/2020 15:17   DG Chest Portable 1 View  Result Date: 04/22/2020 CLINICAL DATA:  Shortness of breath and weakness. Chronic renal failure EXAM: PORTABLE CHEST 1 VIEW COMPARISON:  April 17, 2020 FINDINGS: There is cardiomegaly with mild pulmonary venous hypertension. There is interstitial edema with small pleural effusions bilaterally. There is probable loculated effusion on the right laterally with atelectatic change. No airspace consolidation evident. No adenopathy. Degenerative change in each shoulder noted. IMPRESSION: Cardiomegaly with pulmonary vascular congestion. Pleural effusions and pulmonary edema. The appearance is consistent with a degree of volume overload/congestive heart failure. Suspect loculated effusion along the lateral right base with atelectatic change in this area. No consolidation. Electronically Signed   By: Lowella Grip III M.D.   On: 04/22/2020 14:54   DG Chest Port 1 View  Result Date: 04/12/2020 CLINICAL DATA:  Chest pain. Fell 2 days ago.  History of congestive heart failure. EXAM: PORTABLE CHEST 1 VIEW COMPARISON:  01/09/2020 FINDINGS: Chronic cardiomegaly. Pulmonary venous hypertension. Interstitial and early alveolar pulmonary edema. Worsened density in the lower lungs which could simply be secondary to congestive heart failure. Basilar pneumonia not excluded. Inferior left lung excluded from the film. IMPRESSION: Congestive heart failure with pulmonary edema. Worsened density in the lower lungs which could simply be secondary to congestive heart failure.  Basilar pneumonia not excluded. Electronically Signed   By: Nelson Chimes M.D.   On: 04/12/2020 15:24   DG Hip Port Unilat W or Wo Pelvis 1 View Left  Result Date: 04/22/2020 CLINICAL DATA:  Pain following fall EXAM: DG HIP (WITH OR WITHOUT PELVIS) 1V PORT LEFT COMPARISON:  None. FINDINGS: Frontal view left hip obtained. On frontal view, no fracture or dislocation is appreciable. There is mild narrowing of the left hip joint. No erosive change evident. There are multiple foci of arterial vascular calcification IMPRESSION: No fracture seen on frontal view. No dislocation. Mild narrowing left hip joint. Given history of trauma, correlation with lateral view may well be advisable for more precise assessment. Electronically Signed   By: Lowella Grip III M.D.   On: 04/22/2020 16:28   DG HIP UNILAT WITH PELVIS 2-3 VIEWS LEFT  Result Date: 04/22/2020 CLINICAL DATA:  64 year old male with hip pain. EXAM: DG HIP (WITH OR WITHOUT PELVIS) 2-3V LEFT COMPARISON:  Left hip radiograph dated 04/22/2020. FINDINGS: Evaluation is very limited due to body habitus and osteopenia. No definite acute fracture identified. There is no dislocation. The bones are osteopenic. Extensive lower lumbar fixation hardware. The soft tissues are unremarkable. IMPRESSION: No definite acute fracture or dislocation. Electronically Signed   By: Anner Crete M.D.   On: 04/22/2020 21:34     CBC Recent Labs  Lab 04/22/20 1454 04/23/20 0345  WBC 13.8* 14.2*  HGB 8.7* 8.6*  HCT 30.1* 29.4*  PLT 251 246  MCV 95.9 93.6  MCH 27.7 27.4  MCHC 28.9* 29.3*  RDW 17.0* 17.2*  LYMPHSABS 0.3*  --   MONOABS 0.8  --   EOSABS 0.1  --   BASOSABS 0.0  --     Chemistries  Recent Labs  Lab 04/22/20 1454 04/23/20 0345 04/23/20 0814 04/24/20 0043 04/24/20 0622  NA 132* 133*  --   --  133*  K 5.9* 6.4* 6.3* 5.2* 5.4*  CL 94* 96*  --   --  95*  CO2 28 27  --   --  28  GLUCOSE 137* 152*  --   --  94  BUN 49* 51*  --   --  35*   CREATININE 4.20* 4.41*  --   --  3.28*  CALCIUM 8.3* 8.4*  --   --  8.3*   ------------------------------------------------------------------------------------------------------------------ No results for input(s): CHOL, HDL, LDLCALC, TRIG, CHOLHDL, LDLDIRECT in the last 72 hours.  Lab Results  Component Value Date   HGBA1C 6.2 (H) 04/24/2020   ------------------------------------------------------------------------------------------------------------------ No results for input(s): TSH, T4TOTAL, T3FREE, THYROIDAB in the last 72 hours.  Invalid input(s): FREET3 ------------------------------------------------------------------------------------------------------------------ No results for input(s): VITAMINB12, FOLATE, FERRITIN, TIBC, IRON, RETICCTPCT in the last 72 hours.  Coagulation profile No results for input(s): INR, PROTIME in the last 168 hours.  No results for input(s): DDIMER in the last 72 hours.  Cardiac Enzymes No results for input(s): CKMB, TROPONINI, MYOGLOBIN in the last 168 hours.  Invalid input(s): CK ------------------------------------------------------------------------------------------------------------------    Component Value Date/Time   BNP 3,562.0 (H) 04/12/2020 1356  Roxan Hockey M.D on 04/25/2020 at 10:28 AM  Go to [email or web address] - for contact info  [facility] - Office  [phone]

## 2020-04-25 NOTE — Progress Notes (Signed)
[facility] ROUNDING NOTE   Subjective:   Brief history: 64 year old gentleman with a history of end-stage renal disease ischemic cardiomyopathy with systolic dysfunction hypertension chronic respiratory failure with oxygen dependence.  Presented with right hip pain.  Was found to be hypokalemia with significant pulmonary vascular congestion.  His last dialysis treatment was 04/23/2020.  With 4 L removed.  He was dialyzed 04/24/2020 with 4 L removed.    Subjective: Breathing significantly improved  Blood pressure 114/63 pulse 91 temperature 98 O2 sats 91% 2.5 L nasal cannula  Labs pending   Objective:  Vital signs in last 24 hours:  Temp:  [98 F (36.7 C)-98.2 F (36.8 C)] 98.2 F (36.8 C) (11/21 0630) Pulse Rate:  [81-94] 91 (11/21 0630) Resp:  [18-20] 18 (11/21 0630) BP: (100-128)/(58-75) 115/63 (11/21 0630) SpO2:  [91 %-97 %] 91 % (11/21 0630) Weight:  [106.7 kg-111.7 kg] 106.7 kg (11/21 0500)  Weight change: -4 kg Filed Weights   04/24/20 0500 04/24/20 1550 04/25/20 0500  Weight: 111.7 kg 111.7 kg 106.7 kg    Intake/Output: I/O last 3 completed shifts: In: 720 [P.O.:720] Out: 1660 [[id number]]   Intake/Output this shift:  No intake/output data recorded.   General: well-appearing, no acute distress HEENT: anicteric sclera, MMM, +perioribital edema CV: normal rate, no murmurs Lungs: poor air exchange diffusely, on O2, unlabored, b/l chest expansion Abd: soft, non-tender, non-distended Skin: no visible lesions or rashes Msk: tight edema bilateral LE's  Neuro: normal speech, no gross focal deficits  Access: left AVF +b/t/patent   Basic Metabolic Panel: Recent Labs  Lab 04/22/20 1454 04/23/20 0345 04/23/20 0814 04/24/20 0043 04/24/20 0622  NA 132* 133*  --   --  133*  K 5.9* 6.4* 6.3* 5.2* 5.4*  CL 94* 96*  --   --  95*  CO2 28 27  --   --  28  GLUCOSE 137* 152*  --   --  94  BUN 49* 51*  --   --  35*  CREATININE 4.20* 4.41*  --   --   3.28*  CALCIUM 8.3* 8.4*  --   --  8.3*    Liver Function Tests: No results for input(s): AST, ALT, ALKPHOS, BILITOT, PROT, ALBUMIN in the last 168 hours. No results for input(s): LIPASE, AMYLASE in the last 168 hours. No results for input(s): AMMONIA in the last 168 hours.  CBC: Recent Labs  Lab 04/22/20 1454 04/23/20 0345  WBC 13.8* 14.2*  NEUTROABS 12.6*  --   HGB 8.7* 8.6*  HCT 30.1* 29.4*  MCV 95.9 93.6  PLT 251 246    Cardiac Enzymes: No results for input(s): CKTOTAL, CKMB, CKMBINDEX, TROPONINI in the last 168 hours.  BNP: Invalid input(s): POCBNP  CBG: Recent Labs  Lab 04/24/20 0731 04/24/20 1119 04/24/20 1620 04/24/20 2214 04/25/20 0743  GLUCAP 88 72 112* 82 97    Microbiology: Results for orders placed or performed during the hospital encounter of 04/22/20  Resp Panel by RT-PCR (Flu A&B, Covid) Nasopharyngeal Swab     Status: None   Collection Time: 04/22/20  8:10 PM   Specimen: Nasopharyngeal Swab; Nasopharyngeal(NP) swabs in vial transport medium  Result Value Ref Range Status   SARS Coronavirus 2 by RT PCR NEGATIVE NEGATIVE Final    Comment: (NOTE) SARS-CoV-2 target nucleic acids are NOT DETECTED.  The SARS-CoV-2 RNA is generally detectable in upper respiratory specimens during the acute phase of infection. The lowest concentration of SARS-CoV-2 viral copies this assay can detect is  138 copies/mL. A negative result does not preclude SARS-Cov-2 infection and should not be used as the sole basis for treatment or other patient management decisions. A negative result may occur with  improper specimen collection/handling, submission of specimen other than nasopharyngeal swab, presence of viral mutation(s) within the areas targeted by this assay, and inadequate number of viral copies(<138 copies/mL). A negative result must be combined with clinical observations, patient history, and epidemiological information. The expected result is Negative.  Fact  Sheet for Patients:  EntrepreneurPulse.com.au  Fact Sheet for Healthcare Providers:  IncredibleEmployment.be  This test is no t yet approved or cleared by the [place] FDA and  has been authorized for detection and/or diagnosis of SARS-CoV-2 by FDA under an Emergency Use Authorization (EUA). This EUA will remain  in effect (meaning this test can be used) for the duration of the COVID-19 declaration under Section 564(b)(1) of the Act, 21 U.S.C.section 360bbb-3(b)(1), unless the authorization is terminated  or revoked sooner.       Influenza A by PCR NEGATIVE NEGATIVE Final   Influenza B by PCR NEGATIVE NEGATIVE Final    Comment: (NOTE) The Xpert Xpress SARS-CoV-2/FLU/RSV plus assay is intended as an aid in the diagnosis of influenza from Nasopharyngeal swab specimens and should not be used as a sole basis for treatment. Nasal washings and aspirates are unacceptable for Xpert Xpress SARS-CoV-2/FLU/RSV testing.  Fact Sheet for Patients: EntrepreneurPulse.com.au  Fact Sheet for Healthcare Providers: IncredibleEmployment.be  This test is not yet approved or cleared by the [place] FDA and has been authorized for detection and/or diagnosis of SARS-CoV-2 by FDA under an Emergency Use Authorization (EUA). This EUA will remain in effect (meaning this test can be used) for the duration of the COVID-19 declaration under Section 564(b)(1) of the Act, 21 U.S.C. section 360bbb-3(b)(1), unless the authorization is terminated or revoked.  Performed at [facility], [address], [place], [place] [address]     Coagulation Studies: No results for input(s): LABPROT, INR in the last 72 hours.  Urinalysis: No results for input(s): COLORURINE, LABSPEC, PHURINE, GLUCOSEU, HGBUR, BILIRUBINUR, KETONESUR, PROTEINUR, UROBILINOGEN, NITRITE, LEUKOCYTESUR in the last 72 hours.  Invalid input(s): APPERANCEUR     Imaging: No results found.   Medications:   . sodium chloride    . sodium chloride    . sodium chloride    . sodium chloride    . albumin human 25 g (04/24/20 1650)   . aspirin EC  81 mg Oral Daily  . atorvastatin  40 mg Oral Daily  . Buprenorphine HCl  1 Film Oral BID  . buprenorphine-naloxone  2 tablet Sublingual Daily  . Chlorhexidine Gluconate Cloth  6 each Topical Q0600  . Chlorhexidine Gluconate Cloth  6 each Topical Q0600  . DULoxetine  60 mg Oral Daily  . heparin  5,000 Units Subcutaneous Q8H  . hydrALAZINE  25 mg Oral TID  . insulin aspart  0-5 Units Subcutaneous QHS  . insulin aspart  0-6 Units Subcutaneous TID WC  . levothyroxine  100 mcg Oral Daily  . sucralfate  1 g Oral TID   sodium chloride, sodium chloride, sodium chloride, sodium chloride, acetaminophen **OR** acetaminophen, albumin human, lidocaine (PF), lidocaine-prilocaine, ondansetron **OR** ondansetron (ZOFRAN) IV, pentafluoroprop-tetrafluoroeth, polyethylene glycol, traMADol  Assessment/ Plan:   ESRD-underwent dialysis 04/23/2020 and 04/24/2020 will dialyze 04/26/2020 for additional volume removal  ANEMIA-we will add darbepoetin and check iron studies  MBD-follow renal panel and adjust binders  HTN/VOL-significant volume overload vascular congestion ultrafilter to remove  further volume.  ACCESS-left AV fistula  Diabetes mellitus as per primary service   LOS: 2 Sherril Croon @TODAY @7 :52 AM

## 2020-04-26 ENCOUNTER — Encounter (HOSPITAL_COMMUNITY): Payer: Self-pay | Admitting: Family Medicine

## 2020-04-26 DIAGNOSIS — N186 End stage renal disease: Secondary | ICD-10-CM | POA: Diagnosis not present

## 2020-04-26 DIAGNOSIS — Z7189 Other specified counseling: Secondary | ICD-10-CM

## 2020-04-26 DIAGNOSIS — I5023 Acute on chronic systolic (congestive) heart failure: Secondary | ICD-10-CM | POA: Diagnosis not present

## 2020-04-26 DIAGNOSIS — Z515 Encounter for palliative care: Secondary | ICD-10-CM | POA: Diagnosis not present

## 2020-04-26 DIAGNOSIS — Z992 Dependence on renal dialysis: Secondary | ICD-10-CM

## 2020-04-26 LAB — BASIC METABOLIC PANEL
Anion gap: 10 (ref 5–15)
BUN: 34 mg/dL — ABNORMAL HIGH (ref 8–23)
CO2: 28 mmol/L (ref 22–32)
Calcium: 8.4 mg/dL — ABNORMAL LOW (ref 8.9–10.3)
Chloride: 95 mmol/L — ABNORMAL LOW (ref 98–111)
Creatinine, Ser: 3.59 mg/dL — ABNORMAL HIGH (ref 0.61–1.24)
GFR, Estimated: 18 mL/min — ABNORMAL LOW (ref >60)
Glucose, Bld: 109 mg/dL — ABNORMAL HIGH (ref 70–99)
Potassium: 4.5 mmol/L (ref 3.5–5.1)
Sodium: 133 mmol/L — ABNORMAL LOW (ref 135–145)

## 2020-04-26 LAB — CBC
HCT: 26.5 % — ABNORMAL LOW (ref 39.0–52.0)
Hemoglobin: 7.8 g/dL — ABNORMAL LOW (ref 13.0–17.0)
MCH: 27.3 pg (ref 26.0–34.0)
MCHC: 29.4 g/dL — ABNORMAL LOW (ref 30.0–36.0)
MCV: 92.7 fL (ref 80.0–100.0)
Platelets: 211 10*3/uL (ref 150–400)
RBC: 2.86 MIL/uL — ABNORMAL LOW (ref 4.22–5.81)
RDW: 16.8 % — ABNORMAL HIGH (ref 11.5–15.5)
WBC: 9 10*3/uL (ref 4.0–10.5)
nRBC: 0 % (ref 0.0–0.2)

## 2020-04-26 LAB — GLUCOSE, CAPILLARY
Glucose-Capillary: 110 mg/dL — ABNORMAL HIGH (ref 70–99)
Glucose-Capillary: 102 mg/dL — ABNORMAL HIGH (ref 70–99)
Glucose-Capillary: 117 mg/dL — ABNORMAL HIGH (ref 70–99)
Glucose-Capillary: 116 mg/dL — ABNORMAL HIGH (ref 70–99)

## 2020-04-26 MED ORDER — DARBEPOETIN ALFA 100 MCG/0.5ML IJ SOSY
100.0000 ug | PREFILLED_SYRINGE | INTRAMUSCULAR | Status: DC
  Administered 2020-04-26: 100 ug via INTRAVENOUS
  Filled 2020-04-26 (×2): qty 0.5

## 2020-04-26 MED ORDER — OXYCODONE-ACETAMINOPHEN 7.5-325 MG PO TABS
1.0000 | ORAL_TABLET | Freq: Once | ORAL | Status: AC
  Administered 2020-04-26: 1 via ORAL
  Filled 2020-04-26: qty 1

## 2020-04-26 MED ORDER — LACTULOSE 10 GM/15ML PO SOLN
30.0000 g | Freq: Once | ORAL | Status: AC
  Administered 2020-04-26: 30 g via ORAL
  Filled 2020-04-26: qty 60

## 2020-04-26 NOTE — Progress Notes (Signed)
Admit: 04/22/2020 LOS: 3  17M ESRD MWF [place] with AoC Resp Failure, weakness/FTT for SNF  Subjective:   No c/o this AM  No AM labs  11/21 0701 - 11/22 0700 In: 480 [P.O.:480] Out: -   Filed Weights   04/24/20 1550 04/25/20 0500 04/26/20 0533  Weight: 111.7 kg 106.7 kg 106.8 kg    Scheduled Meds:  aspirin EC  81 mg Oral Daily   atorvastatin  40 mg Oral Daily   Buprenorphine HCl  1 Film Oral BID   Chlorhexidine Gluconate Cloth  6 each Topical Q0600   Chlorhexidine Gluconate Cloth  6 each Topical Q0600   Chlorhexidine Gluconate Cloth  6 each Topical Q0600   DULoxetine  60 mg Oral Daily   heparin  5,000 Units Subcutaneous Q8H   hydrALAZINE  25 mg Oral TID   insulin aspart  0-5 Units Subcutaneous QHS   insulin aspart  0-6 Units Subcutaneous TID WC   levothyroxine  100 mcg Oral Daily   sucralfate  1 g Oral TID   Continuous Infusions:  sodium chloride     sodium chloride     albumin human 25 g (04/24/20 1650)   PRN Meds:.sodium chloride, sodium chloride, acetaminophen **OR** acetaminophen, albumin human, lidocaine (PF), lidocaine-prilocaine, ondansetron **OR** ondansetron (ZOFRAN) IV, oxyCODONE-acetaminophen, pentafluoroprop-tetrafluoroeth, polyethylene glycol  Current Labs: reviewed    Physical Exam:  Blood pressure 124/65, pulse 91, temperature 98.2 F (36.8 C), temperature source Oral, resp. rate 20, height 5\' 11"  (1.803 m), weight 106.8 kg, SpO2 92 %. Chronically ill appearing NAD RRR RRR CTAB LFA AVF    A 1. ESRD MWF [facility] LFA AVF, on schedule 2. A/C s/d CHF 3. FTT,  Weakness, for SNF 4. Anemia 5. DM2 6. Vol?HTN 7. Chronic Pain 8. Hyperkalemia 9. Depression  P  HD today on schedule, 2K, 4-5L UF  OK For outpt HD after today  Medication Issues; o Preferred narcotic agents for pain control are hydromorphone, fentanyl, and methadone. Morphine should not be used.  o Baclofen should be avoided o Avoid oral sodium phosphate  and magnesium citrate based laxatives / bowel preps    Pearson Grippe MD 04/26/2020, 8:57 AM  Recent Labs  Lab 04/22/20 1454 04/22/20 1454 04/23/20 0345 04/23/20 0345 04/23/20 0814 04/24/20 0043 04/24/20 0622  NA 132*  --  133*  --   --   --  133*  K 5.9*   < > 6.4*   < > 6.3* 5.2* 5.4*  CL 94*  --  96*  --   --   --  95*  CO2 28  --  27  --   --   --  28  GLUCOSE 137*  --  152*  --   --   --  94  BUN 49*  --  51*  --   --   --  35*  CREATININE 4.20*  --  4.41*  --   --   --  3.28*  CALCIUM 8.3*  --  8.4*  --   --   --  8.3*   < > = values in this interval not displayed.   Recent Labs  Lab 04/22/20 1454 04/23/20 0345  WBC 13.8* 14.2*  NEUTROABS 12.6*  --   HGB 8.7* 8.6*  HCT 30.1* 29.4*  MCV 95.9 93.6  PLT 251 246

## 2020-04-26 NOTE — Care Management Important Message (Signed)
Important Message  Patient Details  Name: Mark [name] MRN: [id number] Date of Birth: 01-Jun-1956   Medicare Important Message Given:  Yes     Tommy Medal 04/26/2020, 12:51 PM

## 2020-04-26 NOTE — TOC Progression Note (Signed)
Transition of Care [facility]) - Progression Note    Patient Details  Name: Mark [name] MRN: [id number] Date of Birth: 14-Nov-1955  [facility]) CM/SW Contact  Shade Flood, LCSW Phone Number: 04/26/2020, 1:11 PM  Clinical Narrative:     TOC following. Pt status reviewed with MD in Progression this AM. Per MD, pt will likely be medically stable for transfer to SNF tomorrow. Met with pt and his wife at bedside to review dc planning and SNF bed offers. Pt selects [facility]. Updated [name] at [facility] and she will follow up with pt's wife for admission information.  TOC will follow up tomorrow to further assist with dc planning.  Expected Discharge Plan: Skilled Nursing Facility Barriers to Discharge: ED Patient Insisting on an Alternate Living Situation/Facility  Expected Discharge Plan and Services Expected Discharge Plan: [place] In-house Referral: Clinical Social Work Discharge Planning Services: NA Post Acute Care Choice: [place] Living arrangements for the past 2 months: Single Family Home                 DME Arranged: N/A DME Agency: NA       HH Arranged: NA HH Agency: NA         Social Determinants of Health (SDOH) Interventions    Readmission Risk Interventions No flowsheet data found.

## 2020-04-26 NOTE — Consult Note (Signed)
Consultation Note Date: 04/26/2020   Patient Name: Mark [name]  DOB: 1956/02/25  MRN: [id number]  Age / Sex: 64 y.o., male  PCP: Leeanne Rio, MD Referring Physician: Roxan Hockey, MD  Reason for Consultation: Establishing goals of care and Psychosocial/spiritual support  HPI/Patient Profile: 64 y.o. male  with past medical history of ESRD, ischemic cardiomyopathy/ sHF, HTN, chronic resp fail/ 3L [employer] admitted on 04/22/2020 with acute on chronic heart failure, ESRD.   Clinical Assessment and Goals of Care: I have reviewed medical records including EPIC notes, labs and imaging, received report from attending, examined the patient and met at bedside with wife of 83 years, Mark [name], to discuss diagnosis prognosis, GOC, EOL wishes, disposition and options.  Mark [name], [name], is lying quietly in bed having HD treatment.  He is resting with his eyes closed, but will open his eyes when he speaks with me.  He is alert and oriented x3, able to make his needs known.  His wife is at bedside.  I introduced Palliative Medicine as specialized medical care for people living with serious illness. It focuses on providing relief from the symptoms and stress of a serious illness.  Mark [name] is active with outpatient palliative services through hospice of [facility].  We discussed a brief life review of the patient.  Mr. Mrs. [name] have been married for 57 years, they have 2 adult sons.  One lives locally, one lives in [place].  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  As far as functional and nutritional status, family describes a decline over the last few months, last 1 month in particular.  [name] is to the point of needing help with ADLs.  He also experiences fluid overload.  We discussed current illness and what it means in the larger context of on-going co-morbidities.  Natural  disease trajectory and expectations at EOL were discussed.  At this point, Mark [name] would like to continue to treat the treatable, continue with hemodialysis.  He tells me that he understands if he were to stop hemodialysis he would only live a few weeks.  I attempted to elicit values and goals of care important to the patient.  Mark [name] states his ultimate goal is to get strengthen his legs and return home.  We talked about his worries, that his disability will worsen, and his hopes that he would be able to stand.  The difference between aggressive medical intervention and comfort care was considered in light of the patient's goals of care.   Advanced directives, concepts specific to code status, were considered and discussed.  Mark [name] shares that they have completed healthcare power of attorney/DURABLE POWER OF ATTORNEY/living will.  They are in agreement for DNR, orders changed, goldenrod form completed.  Hospice and Palliative Care services outpatient were explained and offered.  [name] is active with outpatient palliative services through hospice of [facility].  Questions and concerns were addressed.  The family was encouraged to call with questions  or concerns.   Conference with attending, bedside nursing staff, HD nursing staff transition of care team, chaplaincy related to patient condition, needs, goals of care.  Contact from hemodialysis nurse stating that Mark [name] would like to speak to palliative provider.  I call and speak with her and her concerns are psychosocial related.  She is agreeable to have chaplaincy reach out.  HCPOA   HCPOA -Mark [name] names his wife of 19 years, [name] as his Ambulance person.  She shares that they have completed healthcare and [place] paperwork and living will paperwork.    SUMMARY OF RECOMMENDATIONS   At this point continue to treat the treatable but no CPR or intubation. Agreeable to short-term  rehab with ultimate goal of returning home. We talked about his hopes and fears, the what if's about his ability to return to home Active with outpatient palliative services through hospice of [place]:  DNR -per patient and wife.  Goldenrod form completed and placed on chart  Symptom Management:   Per hospitalist, no additional needs at this time.  Palliative Prophylaxis:   Frequent Pain Assessment and Turn Reposition  Additional Recommendations (Limitations, Scope, Preferences):  Treat the treatable but no CPR or intubation, continue HD  Psycho-social/Spiritual:   Desire for further Chaplaincy support:no  Additional Recommendations: Caregiving  Support/Resources and Grief/Bereavement Support  Prognosis:   Unable to determine, based on outcomes.  Mark [name] shares that he understands that time would be sort if he were to stop dialysis.  At this point he shares that he would like to continue treatment.   Discharge Planning: Agreeable to short-term rehab.  May need long-term care      Primary Diagnoses: Present on Admission: . Abnormal ECG . HTN (hypertension) . Ischemic cardiomyopathy . Acute on chronic systolic CHF (congestive heart failure) ([place]) . ESRD (end stage renal disease) ([place])   I have reviewed the medical record, interviewed the patient and family, and examined the patient. The following aspects are pertinent.  Past Medical History:  Diagnosis Date  . Anxiety    occas. panic attack, takes xanax occas  . Arthritis    herniated disc, lumbar   . Back pain   . CAD (coronary artery disease)    03/25/2015 95% prox to mid LAD treated with Promus Premier DES 3.5x64m postdilated to 4.153m 100% prox to distal RCA lesion filled in the PDA region via collaterals from LAD and PLA  . [facility] ([facility]   - skin ca on face- removed   . [place]ive heart failure) ([facility] [place]  . Chronic kidney disease   . Complication of  anesthesia    " it takes a long time to get over it."   . Diabetes mellitus without complication ([facility]   type 2  . Dialysis patient ([facility]  . Fibromyalgia   . GERD (gastroesophageal reflux disease)    otc- pepcid , approx. every other  day    . H/O acute respiratory failure   . Hematuria    being followed by Dr. BeHinda Lenisor decreased kidney function   . History of blood product transfusion 02/13/2014   After having back surgery  . Hypercholesteremia   . Hypertension   . Myocardial infarction ([facility]  . Pneumonia    "walking"  . Shortness of breath dyspnea   . Sleep apnea    test aborted, due to not able to relax , since the aborted test  he had surgery for gallbladder & he reports that he was told that he has sleep apnea    Social History   Socioeconomic History  . Marital status: Married    Spouse name: Not on file  . Number of children: 2  . Years of education: HS  . Highest education level: Not on file  Occupational History  . Not on file  Tobacco Use  . Smoking status: Current Every Day Smoker    Packs/day: 0.50    Years: 40.00    Pack years: 20.00    Types: Cigarettes    Start date: 10/17/1973    Last attempt to quit: 02/07/2015    Years since quitting: 5.2  . Smokeless tobacco: Never Used  Vaping Use  . Vaping Use: Never used  Substance and Sexual Activity  . Alcohol use: No    Alcohol/week: 0.0 standard drinks  . Drug use: No  . Sexual activity: Not on file  Other Topics Concern  . Not on file  Social History Narrative   Drinks 3 Diet sodas a day    Social Determinants of Health   Financial Resource Strain:   . Difficulty of Paying Living Expenses: Not on file  Food Insecurity:   . Worried About Charity fundraiser in the Last Year: Not on file  . Ran Out of Food in the Last Year: Not on file  Transportation Needs:   . Lack of Transportation (Medical): Not on file  . Lack of Transportation (Non-Medical): Not on file  Physical Activity:   .  Days of Exercise per Week: Not on file  . Minutes of Exercise per Session: Not on file  Stress:   . Feeling of Stress : Not on file  Social Connections:   . Frequency of Communication with Friends and Family: Not on file  . Frequency of Social Gatherings with Friends and Family: Not on file  . Attends Religious Services: Not on file  . Active Member of Clubs or Organizations: Not on file  . Attends Archivist Meetings: Not on file  . Marital Status: Not on file   Family History  Adopted: Yes  Problem Relation Age of Onset  . Prostate cancer Father   . Cancer Father        prostate  . Heart failure Maternal Grandmother   . Heart attack Maternal Grandfather   . Colon cancer Maternal Grandfather    Scheduled Meds: . aspirin EC  81 mg Oral Daily  . atorvastatin  40 mg Oral Daily  . Buprenorphine HCl  1 Film Oral BID  . Chlorhexidine Gluconate Cloth  6 each Topical Q0600  . Chlorhexidine Gluconate Cloth  6 each Topical Q0600  . Chlorhexidine Gluconate Cloth  6 each Topical Q0600  . darbepoetin (ARANESP) injection - DIALYSIS  100 mcg Intravenous Q Mon-HD  . DULoxetine  60 mg Oral Daily  . heparin  5,000 Units Subcutaneous Q8H  . hydrALAZINE  25 mg Oral TID  . insulin aspart  0-5 Units Subcutaneous QHS  . insulin aspart  0-6 Units Subcutaneous TID WC  . levothyroxine  100 mcg Oral Daily  . sucralfate  1 g Oral TID   Continuous Infusions: . sodium chloride    . sodium chloride    . albumin human 25 g (04/24/20 1650)   PRN Meds:.sodium chloride, sodium chloride, acetaminophen **OR** acetaminophen, albumin human, lidocaine (PF), lidocaine-prilocaine, ondansetron **OR** ondansetron (ZOFRAN) IV, oxyCODONE-acetaminophen, pentafluoroprop-tetrafluoroeth, polyethylene glycol Medications Prior to Admission:  Prior  to Admission medications   Medication Sig Start Date End Date Taking? Authorizing Provider  albuterol (PROAIR HFA) 108 (90 Base) MCG/ACT inhaler Inhale 2 puffs into  the lungs every 4 (four) hours as needed. 12/05/16  Yes [provider]  aspirin EC 81 MG tablet Take 1 tablet (81 mg total) by mouth daily. 03/09/18  Yes Rehman, Mechele Dawley, MD  atorvastatin (LIPITOR) 40 MG tablet Take 1 tablet (40 mg total) by mouth daily. 07/15/19 04/22/20 Yes Herminio Commons, MD  BELBUCA 900 MCG FILM Take 1 Film by mouth 2 (two) times daily. 01/02/20  Yes [provider]  betamethasone dipropionate (DIPROLENE) 0.05 % cream Apply 1 application topically 2 (two) times daily as needed (irritation).   Yes [provider]  DULoxetine (CYMBALTA) 60 MG capsule Take 60 mg by mouth daily.    Yes [provider]  gabapentin (NEURONTIN) 100 MG capsule Take 100 mg by mouth 3 (three) times daily.   Yes [provider]  hydrALAZINE (APRESOLINE) 25 MG tablet TAKE ONE TABLET BY MOUTH THREE TIMES DAILY 12/02/19  Yes Verta Ellen., NP  levothyroxine (SYNTHROID) 100 MCG tablet Take 100 mcg by mouth daily. 01/12/20  Yes [provider]  lidocaine-prilocaine (EMLA) cream Apply 1 application topically as needed (before dialysis).   Yes [provider]  nitroGLYCERIN (NITROSTAT) 0.4 MG SL tablet Place 1 tablet (0.4 mg total) under the tongue every 5 (five) minutes x 3 doses as needed for chest pain (If no relief after 3rd dose, proceed to the ED for an evaluation). I 04/12/18  Yes Herminio Commons, MD  ondansetron (ZOFRAN) 4 MG tablet Take 4 mg by mouth every 8 (eight) hours as needed. 04/12/20  Yes [provider]  OXYGEN Inhale 3.5 L into the lungs continuous.    Yes [provider]  pregabalin (LYRICA) 100 MG capsule Take 1 capsule by mouth in the morning, at noon, and at bedtime. 04/21/20  Yes [provider]  sucralfate (CARAFATE) 1 g tablet Take 1 g by mouth 3 (three) times daily.   Yes [provider]  traMADol (ULTRAM) 50 MG tablet Take 50 mg by mouth 2 (two) times daily as needed. 09/06/19  Yes  [provider]  amoxicillin (AMOXIL) 875 MG tablet Take 875 mg by mouth 2 (two) times daily. Patient not taking: Reported on 04/22/2020 01/13/20   [provider]  carvedilol (COREG) 6.25 MG tablet Take 1 tablet by mouth in the morning and at bedtime. Patient not taking: Reported on 04/22/2020 10/13/19   [provider]  cloNIDine (CATAPRES) 0.1 MG tablet Take 0.1 mg by mouth daily. Patient not taking: Reported on 04/22/2020    [provider]  polyethylene glycol (MIRALAX / GLYCOLAX) 17 g packet Take 17 g by mouth as needed.  Patient not taking: Reported on 04/22/2020    [provider]  predniSONE (DELTASONE) 20 MG tablet Take 1 tablet (20 mg total) by mouth 2 (two) times daily. Patient not taking: Reported on 04/22/2020 04/12/20   Daleen Bo, MD   Allergies  Allergen Reactions  . Morphine And Related     Upset stomach   . Varenicline Other (See Comments)    Causes vivid dreams   Review of Systems  Unable to perform ROS: Other    Physical Exam Vitals and nursing note reviewed.  Constitutional:      General: He is not in acute distress.    Appearance: He is obese. He is ill-appearing.  HENT:     Head: Normocephalic and atraumatic.     Mouth/Throat:     Mouth: Mucous membranes are moist.  Cardiovascular:     Rate and Rhythm: Normal rate.  Pulmonary:     Effort: Pulmonary effort is normal. No respiratory distress.  Abdominal:     Palpations: Abdomen is soft.  Musculoskeletal:        General: Swelling present.  Skin:    General: Skin is warm and dry.  Neurological:     Mental Status: He is alert and oriented to person, place, and time.  Psychiatric:        Mood and Affect: Mood normal.        Behavior: Behavior normal.     Vital Signs: BP (!) 147/75   Pulse 95   Temp 98.1 F (36.7 C) (Oral)   Resp 16   Ht 5' 11"  (1.803 m)   Wt 106.8 kg   SpO2 93%   BMI 32.84 kg/m  Pain Scale: 0-10   Pain Score: 6    SpO2:  SpO2: 93 % O2 Device:SpO2: 93 % O2 Flow Rate: .O2 Flow Rate (L/min): 3 L/min  IO: Intake/output summary:   Intake/Output Summary (Last 24 hours) at 04/26/2020 1217 Last data filed at 04/25/2020 1500 Gross per 24 hour  Intake 240 ml  Output --  Net 240 ml    LBM: Last BM Date: 04/23/20 Baseline Weight: Weight: 109 kg Most recent weight: Weight: 106.8 kg     Palliative Assessment/Data:   Flowsheet Rows     Most Recent Value  Intake Tab  Referral Department Hospitalist  Unit at Time of Referral Cardiac/Telemetry Unit  Palliative Care Primary Diagnosis Cardiac  Date Notified 04/23/20  Palliative Care Type New Palliative care  Reason for referral Clarify Goals of Care  Date of Admission 04/22/20  Date first seen by Palliative Care 04/26/20  # of days Palliative referral response time 3 Day(s)  # of days IP prior to Palliative referral 1  Clinical Assessment  Palliative Performance Scale Score 50%  Pain Max last 24 hours Not able to report  Pain Min Last 24 hours Not able to report  Dyspnea Max Last 24 Hours Not able to report  Dyspnea Min Last 24 hours Not able to report  Psychosocial & Spiritual Assessment  Palliative Care Outcomes      Time In: 0850 Time Out: 1000 Time Total: 70 minutes  Greater than 50%  of this time was spent counseling and coordinating care related to the above assessment and plan.  Signed by: Drue Novel, NP   Please contact Palliative Medicine Team phone at [phone] for questions and concerns.  For individual provider: See Shea Evans

## 2020-04-26 NOTE — Progress Notes (Signed)
Patient Demographics:    Daveyon Kitchings, is a 64 y.o. male, DOB - May 01, 1956, QPR:[id number]  Admit date - 04/22/2020   Admitting Physician Darina Hartwell Denton Brick, MD  Outpatient Primary MD for the patient is Leeanne Rio, MD  LOS - 3  Chief Complaint  Patient presents with   Admission        Subjective:    Tylon Kemmerling today has no fevers, no emesis,  No chest pain,   - --Complains of constipation --Plan is for hemodialysis session today 04/26/2020  -A.m. labs are pending - -awaiting transfer to SNF facility-  Assessment  & Plan :    Principal Problem:   Acute on chronic systolic CHF (congestive heart failure) (HCC) Active Problems:   Abnormal ECG   HTN (hypertension)   ESRD (end stage renal disease) on dialysis [facility])   Ischemic cardiomyopathy   Left hip pain   ESRD (end stage renal disease) ([place])  Brief Summary:- 64 y.o. male with medical history significant for ESRD, ischemic cardiomyopathy with systolic heart failure, hypertension with chronic hypoxic respiratory failure on home O2--admitted on 04/22/2020 with worsening hypoxia and volume overload after missing hemodialysis sessions  A/p 1)Acute on chronic hypoxic respiratory failure--- at baseline patient is usually on 3 L of oxygen via nasal cannula -During this admission on 3 L patient's O2 sats dropped down to 86%, O2 sats were titrated up to 7L/min to maintain O2 sats around 90% -After serial/back-to-back hemodialysis sessions with fluid removal on 04/23/2020 and 04/24/2020 pt's acute on chronic hypoxic respiratory failure improved --patient has been titrated down back to 3 L of nasal cannula from 7 L   2)ESRD--usually gets HD MWF usually at [facility] missed HD on 04/21/2020-- -discussed with nephrology service pt had  serial/back-to-back hemodialysis sessions on 04/23/2020 AND 04/24/20,   --Plan is for additional  hemodialysis session on 04/26/2020 per patient's usual schedule Hyperkalemia resolved after Lokelma and back-to-back hemodialysis sessions,  --  Medication Issue:- Preferred narcotic agents for pain control are hydromorphone, fentanyl, and methadone. Morphine should not be used.  Baclofen should be avoided Avoid oral sodium phosphate and magnesium citrate based laxatives / bowel preps    3) major depression--- - depressed mood due to marital strife and chronic health conditions -Evaluated by TTS and deemed not to be suicidal -Patient will need outpatient follow-up with behavioral health provider -Cymbalta ordered for chronic pain and mood disorder  4) chronic back pain/chronic pain syndrome--- continue PTA meds for pain control -Including buprenorphine, Cymbalta added -Stopped tramadol, okay to use Percocet-every 12 hours as needed breakthrough pain  5) acute on chronic combined systolic and diastolic dysfunction CHF--last echo from August 2021 with EF of 40 to 45% and grade 2 diastolic dysfunction --Manage as above #1 #2 with hemodialysis  6)DM2-diet controlled, A1c 6.2 reflecting excellent diabetic control PTA Use Novolog/Humalog Sliding scale insulin with Accu-Cheks/Fingersticks as ordered   7) chronic anemia of ESRD--- hemoglobin is 8.6, -No evidence of ongoing bleeding, defer decision on ESA/EPO to nephrology team -Transfuse if clinically indicated  8)Social/Ethics--- previously had conversations with hospice, currently full code, awaiting additional palliative care consult for goals of care conversations  9) generalized weakness/recurrent falls--- PT eval appreciated recommends SNF rehab  Disposition/Need for in-Hospital Stay- patient unable  to be discharged at this time due to --awaiting transfer to SNF facility-  Status is: Inpatient  Remains inpatient appropriate because:awaiting transfer to SNF facility-   Disposition: The patient is from: Home              Anticipated  d/c is to: SNF              Anticipated d/c date is: 1 day              Patient currently is medically stable to d/c. Barriers: Not Clinically Stable- -awaiting transfer to SNF facility- Code Status : Full code  Family Communication:  (patient is alert, awake and coherent) *   Consults  : Nephrology/palliative care  DVT Prophylaxis  :   - Heparin - SCDs   Lab Results  Component Value Date   PLT 246 04/23/2020    Inpatient Medications  Scheduled Meds:  aspirin EC  81 mg Oral Daily   atorvastatin  40 mg Oral Daily   Buprenorphine HCl  1 Film Oral BID   Chlorhexidine Gluconate Cloth  6 each Topical Q0600   Chlorhexidine Gluconate Cloth  6 each Topical Q0600   Chlorhexidine Gluconate Cloth  6 each Topical Q0600   DULoxetine  60 mg Oral Daily   heparin  5,000 Units Subcutaneous Q8H   hydrALAZINE  25 mg Oral TID   insulin aspart  0-5 Units Subcutaneous QHS   insulin aspart  0-6 Units Subcutaneous TID WC   lactulose  30 g Oral Once   levothyroxine  100 mcg Oral Daily   sucralfate  1 g Oral TID   Continuous Infusions:  sodium chloride     sodium chloride     albumin human 25 g (04/24/20 1650)   PRN Meds:.sodium chloride, sodium chloride, acetaminophen **OR** acetaminophen, albumin human, lidocaine (PF), lidocaine-prilocaine, ondansetron **OR** ondansetron (ZOFRAN) IV, oxyCODONE-acetaminophen, pentafluoroprop-tetrafluoroeth, polyethylene glycol    Anti-infectives (From admission, onward)   None        Objective:   Vitals:   04/25/20 0630 04/25/20 1443 04/25/20 2014 04/26/20 0533  BP: 115/63 124/61 139/72 124/65  Pulse: 91 84 89 91  Resp: 18 20 20    Temp: 98.2 F (36.8 C) 97.8 F (36.6 C) 97.9 F (36.6 C) 98.2 F (36.8 C)  TempSrc: Oral  Oral Oral  SpO2: 91% 91% 92% 92%  Weight:    106.8 kg  Height:        Wt Readings from Last 3 Encounters:  04/26/20 106.8 kg  04/17/20 102 kg  04/12/20 97.5 kg     Intake/Output Summary (Last 24 hours)  at 04/26/2020 0951 Last data filed at 04/25/2020 1500 Gross per 24 hour  Intake 480 ml  Output --  Net 480 ml     Physical Exam  Gen:- Awake Alert, chronically ill-appearing HEENT:- [place].AT, No sclera icterus, much improved periorbital edema is improving Neck-Supple Neck, +ve JVD,.  Lungs-improving air movement, no wheezing CV- S1, S2 normal, regular  Abd-  +ve B.Sounds, Abd Soft, No tenderness,    Extremity/Skin:- improving pitting edema, pedal pulses present  Psych-affect is flat, oriented x3 Neuro-generalized weakness, no new focal deficits, no tremors MSK-left arm AV fistula with positive thrill and bruit   Data Review:   Micro Results Recent Results (from the past 240 hour(s))  Resp Panel by RT PCR (RSV, Flu A&B, Covid) - Nasopharyngeal Swab     Status: None   Collection Time: 04/17/20  6:18 PM   Specimen: Nasopharyngeal Swab  Result Value Ref Range Status   SARS Coronavirus 2 by RT PCR NEGATIVE NEGATIVE Final    Comment: (NOTE) SARS-CoV-2 target nucleic acids are NOT DETECTED.  The SARS-CoV-2 RNA is generally detectable in upper respiratoy specimens during the acute phase of infection. The lowest concentration of SARS-CoV-2 viral copies this assay can detect is 131 copies/mL. A negative result does not preclude SARS-Cov-2 infection and should not be used as the sole basis for treatment or other patient management decisions. A negative result may occur with  improper specimen collection/handling, submission of specimen other than nasopharyngeal swab, presence of viral mutation(s) within the areas targeted by this assay, and inadequate number of viral copies (<131 copies/mL). A negative result must be combined with clinical observations, patient history, and epidemiological information. The expected result is Negative.  Fact Sheet for Patients:  PinkCheek.be  Fact Sheet for Healthcare Providers:   GravelBags.it  This test is no t yet approved or cleared by the [place] FDA and  has been authorized for detection and/or diagnosis of SARS-CoV-2 by FDA under an Emergency Use Authorization (EUA). This EUA will remain  in effect (meaning this test can be used) for the duration of the COVID-19 declaration under Section 564(b)(1) of the Act, 21 U.S.C. section 360bbb-3(b)(1), unless the authorization is terminated or revoked sooner.     Influenza A by PCR NEGATIVE NEGATIVE Final   Influenza B by PCR NEGATIVE NEGATIVE Final    Comment: (NOTE) The Xpert Xpress SARS-CoV-2/FLU/RSV assay is intended as an aid in  the diagnosis of influenza from Nasopharyngeal swab specimens and  should not be used as a sole basis for treatment. Nasal washings and  aspirates are unacceptable for Xpert Xpress SARS-CoV-2/FLU/RSV  testing.  Fact Sheet for Patients: PinkCheek.be  Fact Sheet for Healthcare Providers: GravelBags.it  This test is not yet approved or cleared by the [place] FDA and  has been authorized for detection and/or diagnosis of SARS-CoV-2 by  FDA under an Emergency Use Authorization (EUA). This EUA will remain  in effect (meaning this test can be used) for the duration of the  Covid-19 declaration under Section 564(b)(1) of the Act, 21  U.S.C. section 360bbb-3(b)(1), unless the authorization is  terminated or revoked.    Respiratory Syncytial Virus by PCR NEGATIVE NEGATIVE Final    Comment: (NOTE) Fact Sheet for Patients: PinkCheek.be  Fact Sheet for Healthcare Providers: GravelBags.it  This test is not yet approved or cleared by the [place] FDA and  has been authorized for detection and/or diagnosis of SARS-CoV-2 by  FDA under an Emergency Use Authorization (EUA). This EUA will remain  in effect (meaning this test can be  used) for the duration of the  COVID-19 declaration under Section 564(b)(1) of the Act, 21 U.S.C.  section 360bbb-3(b)(1), unless the authorization is terminated or  revoked. Performed at [facility], [address] [place], [place] [address]   Resp Panel by RT-PCR (Flu A&B, Covid) Nasopharyngeal Swab     Status: None   Collection Time: 04/22/20  8:10 PM   Specimen: Nasopharyngeal Swab; Nasopharyngeal(NP) swabs in vial transport medium  Result Value Ref Range Status   SARS Coronavirus 2 by RT PCR NEGATIVE NEGATIVE Final    Comment: (NOTE) SARS-CoV-2 target nucleic acids are NOT DETECTED.  The SARS-CoV-2 RNA is generally detectable in upper respiratory specimens during the acute phase of infection. The lowest concentration of SARS-CoV-2 viral copies this assay can detect is 138 copies/mL. A negative result does not preclude SARS-Cov-2  infection and should not be used as the sole basis for treatment or other patient management decisions. A negative result may occur with  improper specimen collection/handling, submission of specimen other than nasopharyngeal swab, presence of viral mutation(s) within the areas targeted by this assay, and inadequate number of viral copies(<138 copies/mL). A negative result must be combined with clinical observations, patient history, and epidemiological information. The expected result is Negative.  Fact Sheet for Patients:  EntrepreneurPulse.com.au  Fact Sheet for Healthcare Providers:  IncredibleEmployment.be  This test is no t yet approved or cleared by the [place] FDA and  has been authorized for detection and/or diagnosis of SARS-CoV-2 by FDA under an Emergency Use Authorization (EUA). This EUA will remain  in effect (meaning this test can be used) for the duration of the COVID-19 declaration under Section 564(b)(1) of the Act, 21 U.S.C.section 360bbb-3(b)(1), unless the authorization is terminated  or  revoked sooner.       Influenza A by PCR NEGATIVE NEGATIVE Final   Influenza B by PCR NEGATIVE NEGATIVE Final    Comment: (NOTE) The Xpert Xpress SARS-CoV-2/FLU/RSV plus assay is intended as an aid in the diagnosis of influenza from Nasopharyngeal swab specimens and should not be used as a sole basis for treatment. Nasal washings and aspirates are unacceptable for Xpert Xpress SARS-CoV-2/FLU/RSV testing.  Fact Sheet for Patients: EntrepreneurPulse.com.au  Fact Sheet for Healthcare Providers: IncredibleEmployment.be  This test is not yet approved or cleared by the [place] FDA and has been authorized for detection and/or diagnosis of SARS-CoV-2 by FDA under an Emergency Use Authorization (EUA). This EUA will remain in effect (meaning this test can be used) for the duration of the COVID-19 declaration under Section 564(b)(1) of the Act, 21 U.S.C. section 360bbb-3(b)(1), unless the authorization is terminated or revoked.  Performed at [facility], [address], [place], [place] [address]     Radiology Reports DG Chest 2 View  Result Date: 04/17/2020 CLINICAL DATA:  Shortness of breath EXAM: CHEST - 2 VIEW COMPARISON:  April 12, 2020 FINDINGS: The cardiomediastinal silhouette is unchanged and enlarged in contour.Perihilar vascular fullness, unchanged. Small bilateral pleural effusions, similar comparison to prior. No pneumothorax. Diffuse interstitial prominence. Patchy bibasilar heterogeneous opacities, unchanged. Visualized abdomen is unremarkable. Multilevel degenerative changes of the thoracic spine. Degenerative changes of the RIGHT acromioclavicular joint. IMPRESSION: Constellation of findings are favored to reflect a combination of pulmonary edema and atelectasis. Superimposed infection remains in the differential. Electronically Signed   By: Valentino Saxon MD   On: 04/17/2020 14:59   CT ABDOMEN PELVIS W CONTRAST  Result Date:  04/12/2020 CLINICAL DATA:  64 year old male with flank pain and left hip pain for 2 days. No known injury. In stage renal disease on dialysis since 2016. EXAM: CT ABDOMEN AND PELVIS WITH CONTRAST TECHNIQUE: Multidetector CT imaging of the abdomen and pelvis was performed using the standard protocol following bolus administration of intravenous contrast. CONTRAST:  178mL OMNIPAQUE IOHEXOL 300 MG/ML  SOLN COMPARISON:  [facility] CT Abdomen and Pelvis 03/31/2020 and earlier. FINDINGS: Lower chest: Stable cardiomegaly. No pericardial effusion. Calcified coronary artery and aortic atherosclerosis. Mildly increased bilateral lung base pleural effusions which appear mildly loculated. Associated increased lung base opacity which is mostly enhancing and more resembles atelectasis than pneumonia. Hepatobiliary: Chronically absent gallbladder. Geographic decreased liver density in keeping with hepatic steatosis. Pancreas: Negative aside from dystrophic calcifications of the pancreatic head and uncinate, stable. Spleen: Stable spleen size at the upper limits of normal. Several punctate  calcified granuloma are stable. Adrenals/Urinary Tract: Adrenal glands remain within normal limits. Stable kidneys with chronic renal atrophy, small bilateral renal cysts, renal vascular calcifications. Superimposed bilateral collecting system calculi, including a 7 mm diameter calculus unchanged within the left renal pelvis (series 6, image 64) and a smaller more amorphous calcification at the junction of the right renal pelvis and upper pole calyx (series 6, image 70). No subsequent hydronephrosis, but left pararenal stranding and soft tissue edema has increased since last month (series 2, image 44). Decreased renal enhancement in the setting of renal failure limits evaluation for acute kidney infection. Streak artifact from lumbar hardware limits visualization of the left ureter but both ureters appear to remain decompressed.  Stable, unremarkable urinary bladder. Stomach/Bowel: Increased moderate retained stool in the rectosigmoid colon including a moderate stool ball in the rectum (series 2, image 79). Stable retained stool elsewhere throughout redundant large bowel from the CT last month. The cecum is on a lax mesentery located in the right upper quadrant just below the liver edge. No large bowel inflammation identified. Negative terminal ileum. Appendix not delineated. No dilated small bowel. Oral contrast not administered today. Decompressed stomach and duodenum. No free air, free fluid. Vascular/Lymphatic: Extensive Aortoiliac calcified atherosclerosis. Partially calcified chronic infrarenal aortic soft plaque or thrombus (series 2, image 37). Major arterial structures remain patent despite atherosclerosis. Portal venous system is grossly patent on delayed images. Stable lymph nodes, upper limits of normal in the retroperitoneum. Reproductive: Negative. Other: Presacral stranding is stable from last month. No pelvic free fluid. Musculoskeletal: Substantially increased asymmetric subcutaneous edema in the visible lower chest, abdomen and pelvis. Confluent soft tissue edema in the right lower chest wall laterally (series 2, image 22). No soft tissue gas. No organized fluid collection. Abnormal bone mineralization compatible with renal osteodystrophy is stable. Superimposed multilevel lower lumbar fusion sequelae. Stable hardware. No paraspinal soft tissue inflammation identified. No acute osseous abnormality identified. IMPRESSION: 1. Increased left pararenal soft tissue edema since last month with a chronic 7 mm calculus at the left renal pelvis but no hydronephrosis at this time. This might reflect intermittent renal obstruction. Decreased renal enhancement in the setting of chronic renal failure limits the sensitivity for acute pyelonephritis. 2. Substantially increased body wall edema since last month. Small partially loculated  lung base pleural effusions have increased along with bilateral lower lobe opacity favored to be atelectasis. 3. Stable cardiomegaly. Calcified coronary artery and Aortic Atherosclerosis (ICD10-I70.0). Increased retained stool in redundant large bowel. Renal osteodystrophy. Hepatic steatosis. Mild chronic pancreatic calcifications. Electronically Signed   By: Genevie Ann M.D.   On: 04/12/2020 15:17   DG Chest Portable 1 View  Result Date: 04/22/2020 CLINICAL DATA:  Shortness of breath and weakness. Chronic renal failure EXAM: PORTABLE CHEST 1 VIEW COMPARISON:  April 17, 2020 FINDINGS: There is cardiomegaly with mild pulmonary venous hypertension. There is interstitial edema with small pleural effusions bilaterally. There is probable loculated effusion on the right laterally with atelectatic change. No airspace consolidation evident. No adenopathy. Degenerative change in each shoulder noted. IMPRESSION: Cardiomegaly with pulmonary vascular congestion. Pleural effusions and pulmonary edema. The appearance is consistent with a degree of volume overload/congestive heart failure. Suspect loculated effusion along the lateral right base with atelectatic change in this area. No consolidation. Electronically Signed   By: Lowella Grip III M.D.   On: 04/22/2020 14:54   DG Chest Port 1 View  Result Date: 04/12/2020 CLINICAL DATA:  Chest pain. Fell 2 days ago. History of congestive heart  failure. EXAM: PORTABLE CHEST 1 VIEW COMPARISON:  01/09/2020 FINDINGS: Chronic cardiomegaly. Pulmonary venous hypertension. Interstitial and early alveolar pulmonary edema. Worsened density in the lower lungs which could simply be secondary to congestive heart failure. Basilar pneumonia not excluded. Inferior left lung excluded from the film. IMPRESSION: Congestive heart failure with pulmonary edema. Worsened density in the lower lungs which could simply be secondary to congestive heart failure. Basilar pneumonia not excluded.  Electronically Signed   By: Nelson Chimes M.D.   On: 04/12/2020 15:24   DG Hip Port Unilat W or Wo Pelvis 1 View Left  Result Date: 04/22/2020 CLINICAL DATA:  Pain following fall EXAM: DG HIP (WITH OR WITHOUT PELVIS) 1V PORT LEFT COMPARISON:  None. FINDINGS: Frontal view left hip obtained. On frontal view, no fracture or dislocation is appreciable. There is mild narrowing of the left hip joint. No erosive change evident. There are multiple foci of arterial vascular calcification IMPRESSION: No fracture seen on frontal view. No dislocation. Mild narrowing left hip joint. Given history of trauma, correlation with lateral view may well be advisable for more precise assessment. Electronically Signed   By: Lowella Grip III M.D.   On: 04/22/2020 16:28   DG HIP UNILAT WITH PELVIS 2-3 VIEWS LEFT  Result Date: 04/22/2020 CLINICAL DATA:  64 year old male with hip pain. EXAM: DG HIP (WITH OR WITHOUT PELVIS) 2-3V LEFT COMPARISON:  Left hip radiograph dated 04/22/2020. FINDINGS: Evaluation is very limited due to body habitus and osteopenia. No definite acute fracture identified. There is no dislocation. The bones are osteopenic. Extensive lower lumbar fixation hardware. The soft tissues are unremarkable. IMPRESSION: No definite acute fracture or dislocation. Electronically Signed   By: Anner Crete M.D.   On: 04/22/2020 21:34     CBC Recent Labs  Lab 04/22/20 1454 04/23/20 0345  WBC 13.8* 14.2*  HGB 8.7* 8.6*  HCT 30.1* 29.4*  PLT 251 246  MCV 95.9 93.6  MCH 27.7 27.4  MCHC 28.9* 29.3*  RDW 17.0* 17.2*  LYMPHSABS 0.3*  --   MONOABS 0.8  --   EOSABS 0.1  --   BASOSABS 0.0  --     Chemistries  Recent Labs  Lab 04/22/20 1454 04/23/20 0345 04/23/20 0814 04/24/20 0043 04/24/20 0622  NA 132* 133*  --   --  133*  K 5.9* 6.4* 6.3* 5.2* 5.4*  CL 94* 96*  --   --  95*  CO2 28 27  --   --  28  GLUCOSE 137* 152*  --   --  94  BUN 49* 51*  --   --  35*  CREATININE 4.20* 4.41*  --   --   3.28*  CALCIUM 8.3* 8.4*  --   --  8.3*   ------------------------------------------------------------------------------------------------------------------ No results for input(s): CHOL, HDL, LDLCALC, TRIG, CHOLHDL, LDLDIRECT in the last 72 hours.  Lab Results  Component Value Date   HGBA1C 6.2 (H) 04/24/2020   ------------------------------------------------------------------------------------------------------------------ No results for input(s): TSH, T4TOTAL, T3FREE, THYROIDAB in the last 72 hours.  Invalid input(s): FREET3 ------------------------------------------------------------------------------------------------------------------ No results for input(s): VITAMINB12, FOLATE, FERRITIN, TIBC, IRON, RETICCTPCT in the last 72 hours.  Coagulation profile No results for input(s): INR, PROTIME in the last 168 hours.  No results for input(s): DDIMER in the last 72 hours.  Cardiac Enzymes No results for input(s): CKMB, TROPONINI, MYOGLOBIN in the last 168 hours.  Invalid input(s): CK ------------------------------------------------------------------------------------------------------------------    Component Value Date/Time   BNP 3,562.0 (H) 04/12/2020 1356    Shaheen Mende  Rodriguez Aguinaldo M.D on 04/26/2020 at 9:51 AM  Go to [email or web address] - for contact info  [facility] - Office  [phone]

## 2020-04-26 NOTE — Progress Notes (Signed)
MD notified of scheduled medication (patient provided) Buprenorphine being unavailable. AC advised she is unable to retrieve from locked location. Awaiting further instruction

## 2020-04-26 NOTE — Procedures (Addendum)
   HEMODIALYSIS TREATMENT NOTE:  4 hour heparin-free HD completed via LUE AVF (15g/antegrade). Goal met:  5 liters removed without interruption in UF.  Hgb trending down, started on Darbepoetin 100 mcg / week . All blood was returned and hemostasis was achieved in 15 minutes.  No changes from pre-dialysis assessment.    Addendum: Would benefit from chaplain support.  He is overwhelmed with today's palliative care discussions, his own physical limitations, and tension in his marriage--states his wife told him six weeks ago that she was leaving him, although he acknowledges that she has visited daily during this hospitalization and remains an active participant in his care.  Chaplain Luz Brazen was notified of above by Quinn Axe, NP.  Mark Alexandria, RN

## 2020-04-26 NOTE — Progress Notes (Signed)
PT Cancellation Note  Patient Details Name: KAEMON BARNETT MRN: [id number] DOB: 1956-06-04   Cancelled Treatment:    Reason Eval/Treat Not Completed: Patient at procedure or test/unavailable.  Patient receiving dialysis, will check back tomorrow.   3:29 PM, 04/26/20 Lonell Grandchild, MPT Physical Therapist with [facility] [phone] office [phone] mobile phone

## 2020-04-27 DIAGNOSIS — I5023 Acute on chronic systolic (congestive) heart failure: Secondary | ICD-10-CM | POA: Diagnosis not present

## 2020-04-27 DIAGNOSIS — F32A Depression, unspecified: Secondary | ICD-10-CM | POA: Diagnosis present

## 2020-04-27 LAB — GLUCOSE, CAPILLARY
Glucose-Capillary: 84 mg/dL (ref 70–99)
Glucose-Capillary: 84 mg/dL (ref 70–99)

## 2020-04-27 LAB — RESP PANEL BY RT-PCR (FLU A&B, COVID) ARPGX2
Influenza A by PCR: NEGATIVE
Influenza B by PCR: NEGATIVE
SARS Coronavirus 2 by RT PCR: NEGATIVE

## 2020-04-27 MED ORDER — TRAMADOL HCL 50 MG PO TABS: 50.0000 mg | ORAL_TABLET | Freq: Two times a day (BID) | ORAL | 0 refills | Status: AC | PRN

## 2020-04-27 MED ORDER — ASPIRIN EC 81 MG PO TBEC: 81.0000 mg | DELAYED_RELEASE_TABLET | Freq: Every day | ORAL | 11 refills | Status: AC

## 2020-04-27 MED ORDER — ACETAMINOPHEN 325 MG PO TABS: 650.0000 mg | ORAL_TABLET | Freq: Four times a day (QID) | ORAL | 3 refills | Status: AC | PRN

## 2020-04-27 MED ORDER — BELBUCA 900 MCG BU FILM: 1.0000 | ORAL_FILM | Freq: Two times a day (BID) | BUCCAL | 0 refills | Status: AC

## 2020-04-27 MED ORDER — ATORVASTATIN CALCIUM 40 MG PO TABS: 40.0000 mg | ORAL_TABLET | Freq: Every day | ORAL | 2 refills | Status: AC

## 2020-04-27 MED ORDER — CARVEDILOL 3.125 MG PO TABS: 3.1250 mg | ORAL_TABLET | Freq: Two times a day (BID) | ORAL | 11 refills | Status: AC

## 2020-04-27 NOTE — Progress Notes (Signed)
Physical Therapy Treatment Patient Details Name: Mark [name] MRN: [id number] DOB: [birth year] Today's Date: 04/27/2020    History of Present Illness Mark [name] is a 64 y.o. male with medical history significant for ESRD, ischemic cardiomyopathy with systolic heart failure, hypertension with chronic respiratory failure on 3 l.  Patient presented to the ED with complaints of left hip pain.  Patient has chronic pain in his left hip, but reports over the past 2 days the pain is significantly worsened.  He reports a fall on his left side recently.  He did not hit his head.  He ambulates with a walker.     PT Comments    Patient received pain medication prior to therapy, demonstrates increased tolerance for functional mobility, able to stand using RW for up to 10-20 seconds before having to sit due to c/o severe left sided low back pain and unable to take steps or transfer to chair.  Patient had to frequently lean over to the right side while seated at bedside to take pressure of left side low back/hip to relieve pain, demonstrated fair/good return for laterally scooting with Mod assist and put back to bed after therapy.  Patient will benefit from continued physical therapy in hospital and recommended venue below to increase strength, balance, endurance for safe ADLs and gait.    Follow Up Recommendations  SNF     Equipment Recommendations  None recommended by PT    Recommendations for Other Services       Precautions / Restrictions Precautions Precautions: Fall    Mobility  Bed Mobility Overal bed mobility: Needs Assistance Bed Mobility: Rolling;Sidelying to Sit;Sit to Sidelying Rolling: Min assist;Mod assist Sidelying to sit: Mod assist Supine to sit: Mod assist     General bed mobility comments: slow labored movement with c/o severe pain left side low back and hip  Transfers Overall transfer level: Needs assistance Equipment used: Rolling walker (2 wheeled) Transfers:  Sit to/from Stand Sit to Stand: Mod assist;Max assist         General transfer comment: limited to stand with RW due to c/o severe left sided low back pain  Ambulation/Gait                 Stairs             Wheelchair Mobility    Modified Rankin (Stroke Patients Only)       Balance Overall balance assessment: Needs assistance Sitting-balance support: Feet supported;No upper extremity supported Sitting balance-Leahy Scale: Fair Sitting balance - Comments: seated at EOB   Standing balance support: During functional activity;Bilateral upper extremity supported Standing balance-Leahy Scale: Poor Standing balance comment: using RW                            Cognition Arousal/Alertness: Awake/alert Behavior During Therapy: WFL for tasks assessed/performed Overall Cognitive Status: Within Functional Limits for tasks assessed                                        Exercises      General Comments        Pertinent Vitals/Pain Pain Assessment: Faces Faces Pain Scale: Hurts whole lot Pain Location: low back with radiation left anterior thigh to knee Pain Descriptors / Indicators: Grimacing;Guarding;Sharp Pain Intervention(s): Limited activity within patient's tolerance    Home Living  Prior Function            PT Goals (current goals can now be found in the care plan section) Acute Rehab PT Goals Patient Stated Goal: return home after rehab PT Goal Formulation: With patient Time For Goal Achievement: 05/07/20 Potential to Achieve Goals: Good Progress towards PT goals: Progressing toward goals    Frequency    Min 3X/week      PT Plan Current plan remains appropriate    Co-evaluation              AM-PAC PT "6 Clicks" Mobility   Outcome Measure  Help needed turning from your back to your side while in a flat bed without using bedrails?: A Lot Help needed moving from lying on  your back to sitting on the side of a flat bed without using bedrails?: A Lot Help needed moving to and from a bed to a chair (including a wheelchair)?: A Lot Help needed standing up from a chair using your arms (e.g., wheelchair or bedside chair)?: A Lot Help needed to walk in hospital room?: Total Help needed climbing 3-5 steps with a railing? : Total 6 Click Score: 10    End of Session Equipment Utilized During Treatment: Oxygen Activity Tolerance: Patient tolerated treatment well;Patient limited by fatigue;Patient limited by pain Patient left: in bed;with call bell/phone within reach;with bed alarm set Nurse Communication: Mobility status PT Visit Diagnosis: Unsteadiness on feet (R26.81);Other abnormalities of gait and mobility (R26.89);Muscle weakness (generalized) (M62.81)     Time: 2111-5520 PT Time Calculation (min) (ACUTE ONLY): 26 min  Charges:  $Therapeutic Exercise: 8-22 mins $Therapeutic Activity: 8-22 mins                     2:22 PM, 04/27/20 Lonell Grandchild, MPT Physical Therapist with [facility] [phone] office [phone] mobile phone

## 2020-04-27 NOTE — Discharge Summary (Signed)
Mark [name], is a 63 y.o. male  DOB April 18, 1956  MRN [id number].  Admission date:  04/22/2020  Admitting Physician  Roxan Hockey, MD  Discharge Date:  04/27/2020   Primary MD  Leeanne Rio, MD  Recommendations for primary care physician for things to follow:   1)Very low-salt diet advised 2)Weigh yourself daily, call if you gain more than 3 pounds in 1 day or more than 5 pounds in 1 week as your hemodialysis parameters/schedule may need to be adjusted 3)Limit your Fluid  intake to no more than 50 ounces (1.5 Liters) per day 4)Avoid ibuprofen/Advil/Aleve/Motrin/Goody Powders/Naproxen/BC powders/Meloxicam/Diclofenac/Indomethacin and other Nonsteroidal anti-inflammatory medications as these will make you more likely to bleed and can cause stomach ulcers, can also cause Kidney problems.  5) continue hemodialysis per his usual schedule   Admission Diagnosis  Hyperkalemia [E87.5] Left hip pain [M25.552] Right hip pain [M25.551] ESRD (end stage renal disease) ([place]) [N18.6]   Discharge Diagnosis  Hyperkalemia [E87.5] Left hip pain [M25.552] Right hip pain [M25.551] ESRD (end stage renal disease) ([place]) [N18.6]    Principal Problem:   Acute on chronic systolic CHF (congestive heart failure) (HCC) Active Problems:   ESRD (end stage renal disease) on dialysis (HCC)   ESRD (end stage renal disease) ([place])   Goals of care, counseling/discussion   Palliative care by specialist   DNR (do not resuscitate) discussion   Abnormal ECG   HTN (hypertension)   Ischemic cardiomyopathy   Left hip pain   Depression      Past Medical History:  Diagnosis Date  . Anxiety    occas. panic attack, takes xanax occas  . Arthritis    herniated disc, lumbar   . Back pain   . CAD (coronary artery disease)    03/25/2015 95% prox to mid LAD treated with Promus Premier DES 3.5x81mm postdilated to 4.68mm, 100%  prox to distal RCA lesion filled in the PDA region via collaterals from LAD and PLA  . Cancer ([place])    - skin ca on face- removed   . CHF (congestive heart failure) ([place])   . Chronic kidney disease   . Complication of anesthesia    " it takes a long time to get over it."   . Diabetes mellitus without complication ([place])    type 2  . Dialysis patient ([place])   . Fibromyalgia   . GERD (gastroesophageal reflux disease)    otc- pepcid , approx. every other  day    . H/O acute respiratory failure   . Hematuria    being followed by Dr. Hinda Lenis for decreased kidney function   . History of blood product transfusion 02/13/2014   After having back surgery  . Hypercholesteremia   . Hypertension   . Myocardial infarction ([place]) 03/2015  . Pneumonia    "walking"  . Shortness of breath dyspnea   . Sleep apnea    test aborted, due to not able to relax , since the aborted test he had surgery for gallbladder & he reports  that he was told that he has sleep apnea     Past Surgical History:  Procedure Laterality Date  . A/V FISTULAGRAM Left 10/31/2016   Procedure: A/V Fistulagram;  Surgeon: Serafina Mitchell, MD;  Location: [facility];  Service: Cardiovascular;  Laterality: Left;  . AV FISTULA PLACEMENT Left 02/02/2015   Procedure: LEFT ARM RADIOCEPHALIC ARTERIOVENOUS (AV) FISTULA CREATION;  Surgeon: Conrad Brush, MD;  Location: [facility];  Service: Vascular;  Laterality: Left;  . AV FISTULA PLACEMENT Left 05/25/2016   Procedure: ARTERIOVENOUS (AV) FISTULA CREATION;  Surgeon: [name], MD;  Location: [facility];  Service: Vascular;  Laterality: Left;  . BACK SURGERY  02/13/14  . BIOPSY  03/08/2018   Procedure: BIOPSY;  Surgeon: Rogene Houston, MD;  [facility];  Service: Endoscopy;;  gastric  . CARDIAC CATHETERIZATION N/A 03/25/2015   Procedure: Left Heart Cath and Coronary Angiography;  Surgeon: Leonie Man, MD;  Location: [facility];  Service: Cardiovascular;   Laterality: N/A;  . CARDIAC CATHETERIZATION N/A 03/25/2015   Procedure: Coronary Stent Intervention;  Surgeon: Leonie Man, MD;  Location: [facility];  Service: Cardiovascular;  Laterality: N/A;  . CHOLECYSTECTOMY    . COLONOSCOPY    . COLONOSCOPY WITH PROPOFOL N/A 03/08/2018   Procedure: COLONOSCOPY WITH PROPOFOL;  Surgeon: Rogene Houston, MD;  [facility];  Service: Endoscopy;  Laterality: N/A;  11:10  . EYE SURGERY     cataracts remove, bilateral, w/IOL.  Marland Kitchen INSERTION OF DIALYSIS CATHETER N/A 03/09/2015   Procedure: INSERTION OF DIALYSIS CATHETER;  Surgeon: Conrad Spearville, MD;  Location: [facility];  Service: Vascular;  Laterality: N/A;  . INSERTION OF DIALYSIS CATHETER Left 04/07/2016   Procedure: INSERTION OF TUNNELED DIALYSIS CATHETER LEFT INTERNAL JUGULAR, ATTEMPTED INSERTION OF TUNNELLED CATHETER RIGHT INTERNAL JUGULAR;  Surgeon: [name], MD;  [facility];  Service: Vascular;  Laterality: Left;  . IR GENERIC HISTORICAL  07/28/2016   IR FLUORO GUIDE CV LINE LEFT 07/28/2016 Greggory Keen, MD [facility]  . IR REMOVAL TUN CV CATH W/O FL  03/28/2017  . LUMBAR LAMINECTOMY/DECOMPRESSION MICRODISCECTOMY Right 02/13/2014   Procedure: RIGHT LUMBAR FOUR-FIVE, RIGHT LUMBAR FIVE- SACRAL ONE LUMBAR LAMINECTOMY/DECOMPRESSION MICRODISCECTOMY ;  Surgeon: Ashok Pall, MD;  Location: [facility];  Service: Neurosurgery;  Laterality: Right;  Right L4-5 and Right L5-S1 diskectomies  . PERIPHERAL VASCULAR BALLOON ANGIOPLASTY Left 10/31/2016   Procedure: Peripheral Vascular Balloon Angioplasty;  Surgeon: Serafina Mitchell, MD;  Location: [facility];  Service: Cardiovascular;  Laterality: Left;  AV fistula  . PERIPHERAL VASCULAR BALLOON ANGIOPLASTY Left 01/28/2019   Procedure: PERIPHERAL VASCULAR BALLOON ANGIOPLASTY;  Surgeon: Serafina Mitchell, MD;  Location: [facility];  Service: Cardiovascular;  Laterality: Left;  arm fistula  . TEE WITHOUT CARDIOVERSION N/A  05/02/2016   Procedure: TRANSESOPHAGEAL ECHOCARDIOGRAM (TEE);  Surgeon: Herminio Commons, MD;  [facility];  Service: Cardiovascular;  Laterality: N/A;  . TONSILLECTOMY       HPI  from the history and physical done on the day of admission:    Chief Complaint: Right hip pain  HPI: Mark [name] is a 64 y.o. male with medical history significant for ESRD, ischemic cardiomyopathy with systolic heart failure, hypertension with chronic respiratory failure on 3 l. Patient presented to the ED with complaints of left hip pain.  Patient has chronic pain in his left hip, but reports over the past  2 days the pain is significantly worsened.  He reports a fall on his left side recently.  He did not hit his head.  He ambulates with a walker.  Patient was also Dialyzed yesterday, but because of his hip pain he was not able to go for dialysis.  Patient reports some increase in his difficulty breathing.  He has chronic lower extremity swelling.  He does not make any urine.  Patient was dialyzed when he was in the ED 04/17/2020. Patient was also in the process of being transitioned over to stay at an assisted living facility/nursing home.  ED Course: 1.6.  Heart rate 90s, blood pressure systolic 277O.  O2 sat greater than 93% on 3 L.  Potassium 5.9.  WBC 13.8.  Pulmonary vascular congestion, volume overload.  Suspected loculated effusion along the lateral right base with atelectatic change in this area.  No consolidation.  Portable pelvic x-ray is negative for fracture no dislocation mild narrowing of left hip joint.. EDP talked to nephrologist Dr. Posey Pronto, patient will be seen in the morning if admitted.  Review of Systems: As per HPI all other systems reviewed and negative     Hospital Course:   Brief Summary:- 64 y.o.malewith medical history significant forESRD, ischemic cardiomyopathy with systolic heart failure,hypertension with chronic hypoxic respiratory failure on home  O2--admitted on 04/22/2020 with worsening hypoxia and volume overload after missing hemodialysis sessions  A/p 1)Acute on chronic hypoxic respiratory failure--- at baseline patient is usually on 3 L of oxygen via nasal cannula -During this admission on 3 L patient's O2 sats dropped down to 86%, O2 sats were titrated up to 7L/min to maintain O2 sats around 90% -After serial/back-to-back hemodialysis sessions with fluid removal on 04/23/2020 and 04/24/2020 pt's acute on chronic hypoxic respiratory failure improved --patient has been titrated down back to 3 L of nasal cannula from 7 L  2)ESRD--usually gets HD MWF usually at [facility] missed HD on 04/21/2020-- -discussed with nephrology service pt had  serial/back-to-back hemodialysis sessions on 04/23/2020 AND 04/24/20,    --Plan had  additional hemodialysis session on 04/26/2020 per patient's usual schedule Hyperkalemia resolved after [place] and back-to-back hemodialysis sessions,  --  Medication Issue:- Preferred narcotic agents for pain control are hydromorphone, fentanyl, and methadone. Morphine should not be used.  Baclofen should be avoided Avoid oral sodium phosphate and magnesium citrate based laxatives / bowel preps   3) major depression--- - depressed mood due to marital strife and chronic health conditions -Evaluated by TTS and deemed not to be suicidal -Patient will need outpatient follow-up with behavioral health provider -Chaplain and palliative care conversations have been helpful -Cymbalta ordered for chronic pain and mood disorder  4)chronic back pain/chronic pain syndrome--- continue PTA meds for pain control -Including buprenorphine, Cymbalta and use tramadol for breakthrough pain  5) acute on chronic combined systolic and diastolic dysfunction CHF--last echo from August 2021 with EF of 40 to 45% and grade 2 diastolic dysfunction --Manage as above #1 #2 with hemodialysis -Volume status has improved significantly  with 3 sessions of dialysis in the last 5 days  6)DM2-diet controlled, A1c 6.2 reflecting excellent diabetic control PTA Use Novolog/Humalog Sliding scale insulin with Accu-Cheks/Fingersticks as ordered   7) chronic anemia of ESRD--- hemoglobin is 7.8 -No evidence of ongoing bleeding, defer decision on ESA/EPO to nephrology team --Does not meet criteria for transfusion  8)Social/Ethics--- previously had conversations with hospice,   additional palliative care consult for goals of care conversations appreciated, patient is now DNR/DNI --Chaplain  and palliative care conversations have been helpful  9) generalized weakness/recurrent falls--- PT eval appreciated recommends SNF rehab  Disposition/--- transfer to SNF facility-  Disposition: The patient is from: Home  Anticipated d/c is to: SNF    Code Status : DNR  Family Communication:  (patient is alert, awake and coherent) *   Consults  : Nephrology/palliative care  Discharge Condition: Stable  Follow UP   Contact information for after-discharge care    Destination    [facility] .   Service: Skilled Nursing Contact information: [address] [place] [phone]                  Diet and Activity recommendation:  As advised  Discharge Instructions    Discharge Instructions    Call MD for:  difficulty breathing, headache or visual disturbances   Complete by: As directed    Call MD for:  persistant dizziness or light-headedness   Complete by: As directed    Call MD for:  persistant nausea and vomiting   Complete by: As directed    Call MD for:  temperature >100.4   Complete by: As directed    Diet - low sodium heart healthy   Complete by: As directed    Discharge instructions   Complete by: As directed    1)Very low-salt diet advised 2)Weigh yourself daily, call if you gain more than 3  pounds in 1 day or more than 5 pounds in 1 week as your hemodialysis parameters/schedule may need to be adjusted 3)Limit your Fluid  intake to no more than 50 ounces (1.5 Liters) per day 4)Avoid ibuprofen/Advil/Aleve/Motrin/Goody Powders/Naproxen/BC powders/Meloxicam/Diclofenac/Indomethacin and other Nonsteroidal anti-inflammatory medications as these will make you more likely to bleed and can cause stomach ulcers, can also cause Kidney problems.  5) continue hemodialysis per his usual schedule   Increase activity slowly   Complete by: As directed         Discharge Medications     Allergies as of 04/27/2020      Reactions   Morphine And Related    Upset stomach    Varenicline Other (See Comments)   Causes vivid dreams      Medication List    STOP taking these medications   amoxicillin 875 MG tablet Commonly known as: AMOXIL   cloNIDine 0.1 MG tablet Commonly known as: CATAPRES   doxycycline 100 MG capsule Commonly known as: VIBRAMYCIN   predniSONE 20 MG tablet Commonly known as: DELTASONE   pregabalin 100 MG capsule Commonly known as: LYRICA     TAKE these medications   acetaminophen 325 MG tablet Commonly known as: TYLENOL Take 2 tablets (650 mg total) by mouth every 6 (six) hours as needed for mild pain (or Fever >/= 101).   aspirin EC 81 MG tablet Take 1 tablet (81 mg total) by mouth daily with breakfast. What changed: when to take this   atorvastatin 40 MG tablet Commonly known as: LIPITOR Take 1 tablet (40 mg total) by mouth daily.   Belbuca 900 MCG Film Generic drug: Buprenorphine HCl Take 1 Film by mouth 2 (two) times daily.   betamethasone dipropionate 0.05 % cream Apply 1 application topically 2 (two) times daily as needed (irritation).   carvedilol 3.125 MG tablet Commonly known as: Coreg Take 1 tablet (3.125 mg total) by mouth 2 (two) times daily. What changed:   medication strength  how much to take  when to  take this   DULoxetine 60  MG capsule Commonly known as: CYMBALTA Take 60 mg by mouth daily.   gabapentin 100 MG capsule Commonly known as: NEURONTIN Take 100 mg by mouth 3 (three) times daily.   hydrALAZINE 25 MG tablet Commonly known as: APRESOLINE TAKE ONE TABLET BY MOUTH THREE TIMES DAILY   levothyroxine 100 MCG tablet Commonly known as: SYNTHROID Take 100 mcg by mouth daily.   lidocaine-prilocaine cream Commonly known as: EMLA Apply 1 application topically as needed (before dialysis).   nitroGLYCERIN 0.4 MG SL tablet Commonly known as: NITROSTAT Place 1 tablet (0.4 mg total) under the tongue every 5 (five) minutes x 3 doses as needed for chest pain (If no relief after 3rd dose, proceed to the ED for an evaluation). I   ondansetron 4 MG tablet Commonly known as: ZOFRAN Take 4 mg by mouth every 8 (eight) hours as needed.   OXYGEN Inhale 3.5 L into the lungs continuous.   polyethylene glycol 17 g packet Commonly known as: MIRALAX / GLYCOLAX Take 17 g by mouth as needed.   ProAir HFA 108 (90 Base) MCG/ACT inhaler Generic drug: albuterol Inhale 2 puffs into the lungs every 4 (four) hours as needed.   sucralfate 1 g tablet Commonly known as: CARAFATE Take 1 g by mouth 3 (three) times daily.   traMADol 50 MG tablet Commonly known as: ULTRAM Take 1 tablet (50 mg total) by mouth 2 (two) times daily as needed for moderate pain. What changed: reasons to take this       Major procedures and Radiology Reports - PLEASE review detailed and final reports for all details, in brief -  DG Chest 2 View  Result Date: 04/17/2020 CLINICAL DATA:  Shortness of breath EXAM: CHEST - 2 VIEW COMPARISON:  April 12, 2020 FINDINGS: The cardiomediastinal silhouette is unchanged and enlarged in contour.Perihilar vascular fullness, unchanged. Small bilateral pleural effusions, similar comparison to prior. No pneumothorax. Diffuse interstitial prominence. Patchy bibasilar heterogeneous opacities, unchanged.  Visualized abdomen is unremarkable. Multilevel degenerative changes of the thoracic spine. Degenerative changes of the RIGHT acromioclavicular joint. IMPRESSION: Constellation of findings are favored to reflect a combination of pulmonary edema and atelectasis. Superimposed infection remains in the differential. Electronically Signed   By: Valentino Saxon MD   On: 04/17/2020 14:59   CT ABDOMEN PELVIS W CONTRAST  Result Date: 04/12/2020 CLINICAL DATA:  64 year old male with flank pain and left hip pain for 2 days. No known injury. In stage renal disease on dialysis since 2016. EXAM: CT ABDOMEN AND PELVIS WITH CONTRAST TECHNIQUE: Multidetector CT imaging of the abdomen and pelvis was performed using the standard protocol following bolus administration of intravenous contrast. CONTRAST:  160mL OMNIPAQUE IOHEXOL 300 MG/ML  SOLN COMPARISON:  [facility] CT Abdomen and Pelvis 03/31/2020 and earlier. FINDINGS: Lower chest: Stable cardiomegaly. No pericardial effusion. Calcified coronary artery and aortic atherosclerosis. Mildly increased bilateral lung base pleural effusions which appear mildly loculated. Associated increased lung base opacity which is mostly enhancing and more resembles atelectasis than pneumonia. Hepatobiliary: Chronically absent gallbladder. Geographic decreased liver density in keeping with hepatic steatosis. Pancreas: Negative aside from dystrophic calcifications of the pancreatic head and uncinate, stable. Spleen: Stable spleen size at the upper limits of normal. Several punctate calcified granuloma are stable. Adrenals/Urinary Tract: Adrenal glands remain within normal limits. Stable kidneys with chronic renal atrophy, small bilateral renal cysts, renal vascular calcifications. Superimposed bilateral collecting system calculi, including a 7 mm diameter calculus unchanged within the left renal  pelvis (series 6, image 64) and a smaller more amorphous calcification at the junction of  the right renal pelvis and upper pole calyx (series 6, image 70). No subsequent hydronephrosis, but left pararenal stranding and soft tissue edema has increased since last month (series 2, image 44). Decreased renal enhancement in the setting of renal failure limits evaluation for acute kidney infection. Streak artifact from lumbar hardware limits visualization of the left ureter but both ureters appear to remain decompressed. Stable, unremarkable urinary bladder. Stomach/Bowel: Increased moderate retained stool in the rectosigmoid colon including a moderate stool ball in the rectum (series 2, image 79). Stable retained stool elsewhere throughout redundant large bowel from the CT last month. The cecum is on a lax mesentery located in the right upper quadrant just below the liver edge. No large bowel inflammation identified. Negative terminal ileum. Appendix not delineated. No dilated small bowel. Oral contrast not administered today. Decompressed stomach and duodenum. No free air, free fluid. Vascular/Lymphatic: Extensive Aortoiliac calcified atherosclerosis. Partially calcified chronic infrarenal aortic soft plaque or thrombus (series 2, image 37). Major arterial structures remain patent despite atherosclerosis. Portal venous system is grossly patent on delayed images. Stable lymph nodes, upper limits of normal in the retroperitoneum. Reproductive: Negative. Other: Presacral stranding is stable from last month. No pelvic free fluid. Musculoskeletal: Substantially increased asymmetric subcutaneous edema in the visible lower chest, abdomen and pelvis. Confluent soft tissue edema in the right lower chest wall laterally (series 2, image 22). No soft tissue gas. No organized fluid collection. Abnormal bone mineralization compatible with renal osteodystrophy is stable. Superimposed multilevel lower lumbar fusion sequelae. Stable hardware. No paraspinal soft tissue inflammation identified. No acute osseous abnormality  identified. IMPRESSION: 1. Increased left pararenal soft tissue edema since last month with a chronic 7 mm calculus at the left renal pelvis but no hydronephrosis at this time. This might reflect intermittent renal obstruction. Decreased renal enhancement in the setting of chronic renal failure limits the sensitivity for acute pyelonephritis. 2. Substantially increased body wall edema since last month. Small partially loculated lung base pleural effusions have increased along with bilateral lower lobe opacity favored to be atelectasis. 3. Stable cardiomegaly. Calcified coronary artery and Aortic Atherosclerosis (ICD10-I70.0). Increased retained stool in redundant large bowel. Renal osteodystrophy. Hepatic steatosis. Mild chronic pancreatic calcifications. Electronically Signed   By: Genevie Ann M.D.   On: 04/12/2020 15:17   DG Chest Portable 1 View  Result Date: 04/22/2020 CLINICAL DATA:  Shortness of breath and weakness. Chronic renal failure EXAM: PORTABLE CHEST 1 VIEW COMPARISON:  April 17, 2020 FINDINGS: There is cardiomegaly with mild pulmonary venous hypertension. There is interstitial edema with small pleural effusions bilaterally. There is probable loculated effusion on the right laterally with atelectatic change. No airspace consolidation evident. No adenopathy. Degenerative change in each shoulder noted. IMPRESSION: Cardiomegaly with pulmonary vascular congestion. Pleural effusions and pulmonary edema. The appearance is consistent with a degree of volume overload/congestive heart failure. Suspect loculated effusion along the lateral right base with atelectatic change in this area. No consolidation. Electronically Signed   By: Lowella Grip III M.D.   On: 04/22/2020 14:54   DG Chest Port 1 View  Result Date: 04/12/2020 CLINICAL DATA:  Chest pain. Fell 2 days ago. History of congestive heart failure. EXAM: PORTABLE CHEST 1 VIEW COMPARISON:  01/09/2020 FINDINGS: Chronic cardiomegaly. Pulmonary  venous hypertension. Interstitial and early alveolar pulmonary edema. Worsened density in the lower lungs which could simply be secondary to congestive heart failure. Basilar pneumonia not excluded.  Inferior left lung excluded from the film. IMPRESSION: Congestive heart failure with pulmonary edema. Worsened density in the lower lungs which could simply be secondary to congestive heart failure. Basilar pneumonia not excluded. Electronically Signed   By: Nelson Chimes M.D.   On: 04/12/2020 15:24   DG Hip Port Unilat W or Wo Pelvis 1 View Left  Result Date: 04/22/2020 CLINICAL DATA:  Pain following fall EXAM: DG HIP (WITH OR WITHOUT PELVIS) 1V PORT LEFT COMPARISON:  None. FINDINGS: Frontal view left hip obtained. On frontal view, no fracture or dislocation is appreciable. There is mild narrowing of the left hip joint. No erosive change evident. There are multiple foci of arterial vascular calcification IMPRESSION: No fracture seen on frontal view. No dislocation. Mild narrowing left hip joint. Given history of trauma, correlation with lateral view may well be advisable for more precise assessment. Electronically Signed   By: Lowella Grip III M.D.   On: 04/22/2020 16:28   DG HIP UNILAT WITH PELVIS 2-3 VIEWS LEFT  Result Date: 04/22/2020 CLINICAL DATA:  64 year old male with hip pain. EXAM: DG HIP (WITH OR WITHOUT PELVIS) 2-3V LEFT COMPARISON:  Left hip radiograph dated 04/22/2020. FINDINGS: Evaluation is very limited due to body habitus and osteopenia. No definite acute fracture identified. There is no dislocation. The bones are osteopenic. Extensive lower lumbar fixation hardware. The soft tissues are unremarkable. IMPRESSION: No definite acute fracture or dislocation. Electronically Signed   By: Anner Crete M.D.   On: 04/22/2020 21:34    Micro Results   Recent Results (from the past 240 hour(s))  Resp Panel by RT PCR (RSV, Flu A&B, Covid) - Nasopharyngeal Swab     Status: None   Collection  Time: 04/17/20  6:18 PM   Specimen: Nasopharyngeal Swab  Result Value Ref Range Status   SARS Coronavirus 2 by RT PCR NEGATIVE NEGATIVE Final    Comment: (NOTE) SARS-CoV-2 target nucleic acids are NOT DETECTED.  The SARS-CoV-2 RNA is generally detectable in upper respiratoy specimens during the acute phase of infection. The lowest concentration of SARS-CoV-2 viral copies this assay can detect is 131 copies/mL. A negative result does not preclude SARS-Cov-2 infection and should not be used as the sole basis for treatment or other patient management decisions. A negative result may occur with  improper specimen collection/handling, submission of specimen other than nasopharyngeal swab, presence of viral mutation(s) within the areas targeted by this assay, and inadequate number of viral copies (<131 copies/mL). A negative result must be combined with clinical observations, patient history, and epidemiological information. The expected result is Negative.  Fact Sheet for Patients:  PinkCheek.be  Fact Sheet for Healthcare Providers:  GravelBags.it  This test is no t yet approved or cleared by the [place] FDA and  has been authorized for detection and/or diagnosis of SARS-CoV-2 by FDA under an Emergency Use Authorization (EUA). This EUA will remain  in effect (meaning this test can be used) for the duration of the COVID-19 declaration under Section 564(b)(1) of the Act, 21 U.S.C. section 360bbb-3(b)(1), unless the authorization is terminated or revoked sooner.     Influenza A by PCR NEGATIVE NEGATIVE Final   Influenza B by PCR NEGATIVE NEGATIVE Final    Comment: (NOTE) The Xpert Xpress SARS-CoV-2/FLU/RSV assay is intended as an aid in  the diagnosis of influenza from Nasopharyngeal swab specimens and  should not be used as a sole basis for treatment. Nasal washings and  aspirates are unacceptable for Xpert Xpress  SARS-CoV-2/FLU/RSV  testing.  Fact Sheet for Patients: PinkCheek.be  Fact Sheet for Healthcare Providers: GravelBags.it  This test is not yet approved or cleared by the [place] FDA and  has been authorized for detection and/or diagnosis of SARS-CoV-2 by  FDA under an Emergency Use Authorization (EUA). This EUA will remain  in effect (meaning this test can be used) for the duration of the  Covid-19 declaration under Section 564(b)(1) of the Act, 21  U.S.C. section 360bbb-3(b)(1), unless the authorization is  terminated or revoked.    Respiratory Syncytial Virus by PCR NEGATIVE NEGATIVE Final    Comment: (NOTE) Fact Sheet for Patients: PinkCheek.be  Fact Sheet for Healthcare Providers: GravelBags.it  This test is not yet approved or cleared by the [place] FDA and  has been authorized for detection and/or diagnosis of SARS-CoV-2 by  FDA under an Emergency Use Authorization (EUA). This EUA will remain  in effect (meaning this test can be used) for the duration of the  COVID-19 declaration under Section 564(b)(1) of the Act, 21 U.S.C.  section 360bbb-3(b)(1), unless the authorization is terminated or  revoked. Performed at [facility], [address] [place],  [address]   Resp Panel by RT-PCR (Flu A&B, Covid) Nasopharyngeal Swab     Status: None   Collection Time: 04/22/20  8:10 PM   Specimen: Nasopharyngeal Swab; Nasopharyngeal(NP) swabs in vial transport medium  Result Value Ref Range Status   SARS Coronavirus 2 by RT PCR NEGATIVE NEGATIVE Final    Comment: (NOTE) SARS-CoV-2 target nucleic acids are NOT DETECTED.  The SARS-CoV-2 RNA is generally detectable in upper respiratory specimens during the acute phase of infection. The lowest concentration of SARS-CoV-2 viral copies this assay can detect is 138 copies/mL. A negative result does not  preclude SARS-Cov-2 infection and should not be used as the sole basis for treatment or other patient management decisions. A negative result may occur with  improper specimen collection/handling, submission of specimen other than nasopharyngeal swab, presence of viral mutation(s) within the areas targeted by this assay, and inadequate number of viral copies(<138 copies/mL). A negative result must be combined with clinical observations, patient history, and epidemiological information. The expected result is Negative.  Fact Sheet for Patients:  EntrepreneurPulse.com.au  Fact Sheet for Healthcare Providers:  IncredibleEmployment.be  This test is no t yet approved or cleared by the [place] FDA and  has been authorized for detection and/or diagnosis of SARS-CoV-2 by FDA under an Emergency Use Authorization (EUA). This EUA will remain  in effect (meaning this test can be used) for the duration of the COVID-19 declaration under Section 564(b)(1) of the Act, 21 U.S.C.section 360bbb-3(b)(1), unless the authorization is terminated  or revoked sooner.       Influenza A by PCR NEGATIVE NEGATIVE Final   Influenza B by PCR NEGATIVE NEGATIVE Final    Comment: (NOTE) The Xpert Xpress SARS-CoV-2/FLU/RSV plus assay is intended as an aid in the diagnosis of influenza from Nasopharyngeal swab specimens and should not be used as a sole basis for treatment. Nasal washings and aspirates are unacceptable for Xpert Xpress SARS-CoV-2/FLU/RSV testing.  Fact Sheet for Patients: EntrepreneurPulse.com.au  Fact Sheet for Healthcare Providers: IncredibleEmployment.be  This test is not yet approved or cleared by the [place] FDA and has been authorized for detection and/or diagnosis of SARS-CoV-2 by FDA under an Emergency Use Authorization (EUA). This EUA will remain in effect (meaning this test can be used) for the  duration of the COVID-19 declaration under Section 564(b)(1)  of the Act, 21 U.S.C. section 360bbb-3(b)(1), unless the authorization is terminated or revoked.  Performed at [facility], [address] [place], [place] [address]   Resp Panel by RT-PCR (Flu A&B, Covid) Nasopharyngeal Swab     Status: None   Collection Time: 04/27/20 10:47 AM   Specimen: Nasopharyngeal Swab; Nasopharyngeal(NP) swabs in vial transport medium  Result Value Ref Range Status   SARS Coronavirus 2 by RT PCR NEGATIVE NEGATIVE Final    Comment: (NOTE) SARS-CoV-2 target nucleic acids are NOT DETECTED.  The SARS-CoV-2 RNA is generally detectable in upper respiratory specimens during the acute phase of infection. The lowest concentration of SARS-CoV-2 viral copies this assay can detect is 138 copies/mL. A negative result does not preclude SARS-Cov-2 infection and should not be used as the sole basis for treatment or other patient management decisions. A negative result may occur with  improper specimen collection/handling, submission of specimen other than nasopharyngeal swab, presence of viral mutation(s) within the areas targeted by this assay, and inadequate number of viral copies(<138 copies/mL). A negative result must be combined with clinical observations, patient history, and epidemiological information. The expected result is Negative.  Fact Sheet for Patients:  EntrepreneurPulse.com.au  Fact Sheet for Healthcare Providers:  IncredibleEmployment.be  This test is no t yet approved or cleared by the [place] FDA and  has been authorized for detection and/or diagnosis of SARS-CoV-2 by FDA under an Emergency Use Authorization (EUA). This EUA will remain  in effect (meaning this test can be used) for the duration of the COVID-19 declaration under Section 564(b)(1) of the Act, 21 U.S.C.section 360bbb-3(b)(1), unless the authorization is terminated  or revoked  sooner.       Influenza A by PCR NEGATIVE NEGATIVE Final   Influenza B by PCR NEGATIVE NEGATIVE Final    Comment: (NOTE) The Xpert Xpress SARS-CoV-2/FLU/RSV plus assay is intended as an aid in the diagnosis of influenza from Nasopharyngeal swab specimens and should not be used as a sole basis for treatment. Nasal washings and aspirates are unacceptable for Xpert Xpress SARS-CoV-2/FLU/RSV testing.  Fact Sheet for Patients: EntrepreneurPulse.com.au  Fact Sheet for Healthcare Providers: IncredibleEmployment.be  This test is not yet approved or cleared by the [place] FDA and has been authorized for detection and/or diagnosis of SARS-CoV-2 by FDA under an Emergency Use Authorization (EUA). This EUA will remain in effect (meaning this test can be used) for the duration of the COVID-19 declaration under Section 564(b)(1) of the Act, 21 U.S.C. section 360bbb-3(b)(1), unless the authorization is terminated or revoked.  Performed at [facility], [address], [place], [place] [address]        Today   Subjective    Mark [name] today has no new complaints -No significant dyspnea, no chest pains No fever  Or chills    Nausea, Vomiting or Diarrhea        Patient has been seen and examined prior to discharge   Objective   Blood pressure 130/70, pulse 91, temperature 97.9 F (36.6 C), temperature source Oral, resp. rate 20, height 5\' 11"  (1.803 m), weight 96.9 kg, SpO2 96 %.   Intake/Output Summary (Last 24 hours) at 04/27/2020 1245 Last data filed at 04/27/2020 6010 Gross per 24 hour  Intake 480 ml  Output 5000 ml  Net -4520 ml    Exam Gen:- Awake Alert, chronically ill-appearing HEENT:- [place].AT, No sclera icterus, much improved periorbital edema resolved Nose-  3L/min Neck-Supple Neck,No JVD,.  Lungs-improved air movement, no wheezing CV-  S1, S2 normal, regular  Abd-  +ve B.Sounds, Abd Soft, No tenderness,      Extremity/Skin:-Improved  edema, pedal pulses present  Psych-affect is appropriate, oriented x3 Neuro-generalized weakness, no new focal deficits, no tremors MSK-left arm AV fistula with positive thrill and bruit   Data Review   CBC w Diff:  Lab Results  Component Value Date   WBC 9.0 04/26/2020   HGB 7.8 (L) 04/26/2020   HCT 26.5 (L) 04/26/2020   PLT 211 04/26/2020   LYMPHOPCT 2 04/22/2020   MONOPCT 6 04/22/2020   EOSPCT 1 04/22/2020   BASOPCT 0 04/22/2020    CMP:  Lab Results  Component Value Date   NA 133 (L) 04/26/2020   K 4.5 04/26/2020   CL 95 (L) 04/26/2020   CO2 28 04/26/2020   BUN 34 (H) 04/26/2020   CREATININE 3.59 (H) 04/26/2020   PROT 6.6 04/17/2020   ALBUMIN 2.4 (L) 04/17/2020   BILITOT 0.5 04/17/2020   ALKPHOS 93 04/17/2020   AST 17 04/17/2020   ALT 22 04/17/2020  .   Total Discharge time is about 33 minutes  Roxan Hockey M.D on 04/27/2020 at 12:45 PM  Go to [email or web address] -  for contact info  [facility] - Office  [phone]

## 2020-04-27 NOTE — Progress Notes (Signed)
Admit: 04/22/2020 LOS: 4  30M ESRD MWF [facility] with AoC Resp Failure, weakness/FTT for SNF  Subjective:  . HD Yesterday 5L UF, tol well . Down 15kg during this admission . Palliative notes reviewed . No c/o this AM  11/22 0701 - 11/23 0700 In: 720 [P.O.:720] Out: 5000   Filed Weights   04/26/20 0533 04/26/20 1013 04/27/20 0359  Weight: 106.8 kg 106.8 kg 96.9 kg    Scheduled Meds: . aspirin EC  81 mg Oral Daily  . atorvastatin  40 mg Oral Daily  . Buprenorphine HCl  1 Film Oral BID  . Chlorhexidine Gluconate Cloth  6 each Topical Q0600  . Chlorhexidine Gluconate Cloth  6 each Topical Q0600  . Chlorhexidine Gluconate Cloth  6 each Topical Q0600  . darbepoetin (ARANESP) injection - DIALYSIS  100 mcg Intravenous Q Mon-HD  . DULoxetine  60 mg Oral Daily  . heparin  5,000 Units Subcutaneous Q8H  . hydrALAZINE  25 mg Oral TID  . insulin aspart  0-5 Units Subcutaneous QHS  . insulin aspart  0-6 Units Subcutaneous TID WC  . levothyroxine  100 mcg Oral Daily  . sucralfate  1 g Oral TID   Continuous Infusions: . sodium chloride    . sodium chloride    . albumin human 25 g (04/24/20 1650)   PRN Meds:.sodium chloride, sodium chloride, acetaminophen **OR** acetaminophen, albumin human, lidocaine (PF), lidocaine-prilocaine, ondansetron **OR** ondansetron (ZOFRAN) IV, oxyCODONE-acetaminophen, pentafluoroprop-tetrafluoroeth, polyethylene glycol  Current Labs: reviewed    Physical Exam:  Blood pressure 130/70, pulse 91, temperature 97.9 F (36.6 C), temperature source Oral, resp. rate 20, height 5\' 11"  (1.803 m), weight 96.9 kg, SpO2 96 %. Chronically ill appearing NAD RRR RRR CTAB LFA AVF    A 1. ESRD MWF [facility] LFA AVF, on schedule 2. A/C s/d CHF; hypervolemia, improved 3. FTT,  Weakness, for SNF 4. Anemia, given aranesp 100 11/22;  5. DM2 6. Vol/HTN 7. Chronic Pain 8. Hyperkalemia 9. Depression  P . HD on schedule while here, but ok for DC when medically  ready . 2K, cont ot probe down post weights; AVF, No heparin . Medication Issues; o Preferred narcotic agents for pain control are hydromorphone, fentanyl, and methadone. Morphine should not be used.  o Baclofen should be avoided o Avoid oral sodium phosphate and magnesium citrate based laxatives / bowel preps    Pearson Grippe MD 04/27/2020, 8:20 AM  Recent Labs  Lab 04/23/20 0345 04/23/20 0814 04/24/20 0043 04/24/20 0622 04/26/20 1030  NA 133*  --   --  133* 133*  K 6.4*   < > 5.2* 5.4* 4.5  CL 96*  --   --  95* 95*  CO2 27  --   --  28 28  GLUCOSE 152*  --   --  94 109*  BUN 51*  --   --  35* 34*  CREATININE 4.41*  --   --  3.28* 3.59*  CALCIUM 8.4*  --   --  8.3* 8.4*   < > = values in this interval not displayed.   Recent Labs  Lab 04/22/20 1454 04/23/20 0345 04/26/20 0951  WBC 13.8* 14.2* 9.0  NEUTROABS 12.6*  --   --   HGB 8.7* 8.6* 7.8*  HCT 30.1* 29.4* 26.5*  MCV 95.9 93.6 92.7  PLT 251 246 211

## 2020-04-27 NOTE — TOC Transition Note (Signed)
Transition of Care [facility]) - CM/SW Discharge Note   Patient Details  Name: Mark [name] MRN: [id number] Date of Birth: [birth year]  [facility]) CM/SW Contact:  Shade Flood, LCSW Phone Number: 04/27/2020, 1:04 PM   Clinical Narrative:     Pt stable for dc today per MD. Updated pt's wife, [name], and she remains in agreement with the plan for transfer to [facility]. [name] at [facility] states they can accept pt today. DC clinical sent electronically. RN to call report.   EMS form printed to the floor. Will arrange EMS when pt ready.  There are no other TOC needs for dc.  Final next level of care: Skilled Nursing Facility Barriers to Discharge: Barriers Resolved   Patient Goals and CMS Choice Patient states their goals for this hospitalization and ongoing recovery are:: SNF for rehab CMS Medicare.gov Compare Post Acute Care list provided to:: Patient Represenative (must comment) [name] (wife)) Choice offered to / list presented to : Spouse  Discharge Placement PASRR number recieved: 04/23/20            Patient chooses bed at: Other - please specify in the comment section below: [facility]) Patient to be transferred to facility by: EMS Name of family member notified: [name] Patient and family notified of of transfer: 04/27/20  Discharge Plan and Services In-house Referral: Clinical Social Work Discharge Planning Services: NA Post Acute Care Choice: [employer]          DME Arranged: N/A DME Agency: NA       HH Arranged: NA HH Agency: NA        Social Determinants of Health (SDOH) Interventions     Readmission Risk Interventions No flowsheet data found.

## 2020-04-27 NOTE — Discharge Instructions (Signed)
1)Very low-salt diet advised 2)Weigh yourself daily, call if you gain more than 3 pounds in 1 day or more than 5 pounds in 1 week as your hemodialysis parameters/schedule may need to be adjusted 3)Limit your Fluid  intake to no more than 50 ounces (1.5 Liters) per day 4)Avoid ibuprofen/Advil/Aleve/Motrin/Goody Powders/Naproxen/BC powders/Meloxicam/Diclofenac/Indomethacin and other Nonsteroidal anti-inflammatory medications as these will make you more likely to bleed and can cause stomach ulcers, can also cause Kidney problems.  5)Continue Hemodialysis per his usual schedule

## 2020-08-03 DEATH — deceased

## 2021-07-16 IMAGING — DX DG CHEST 2V
2 series · 2 of 2 positions shown · non-contrast
Comparison: December 25, 2019

CLINICAL DATA: Constipation and shortness of breath for

EXAM:
CHEST - 2 VIEW

[chest pa]
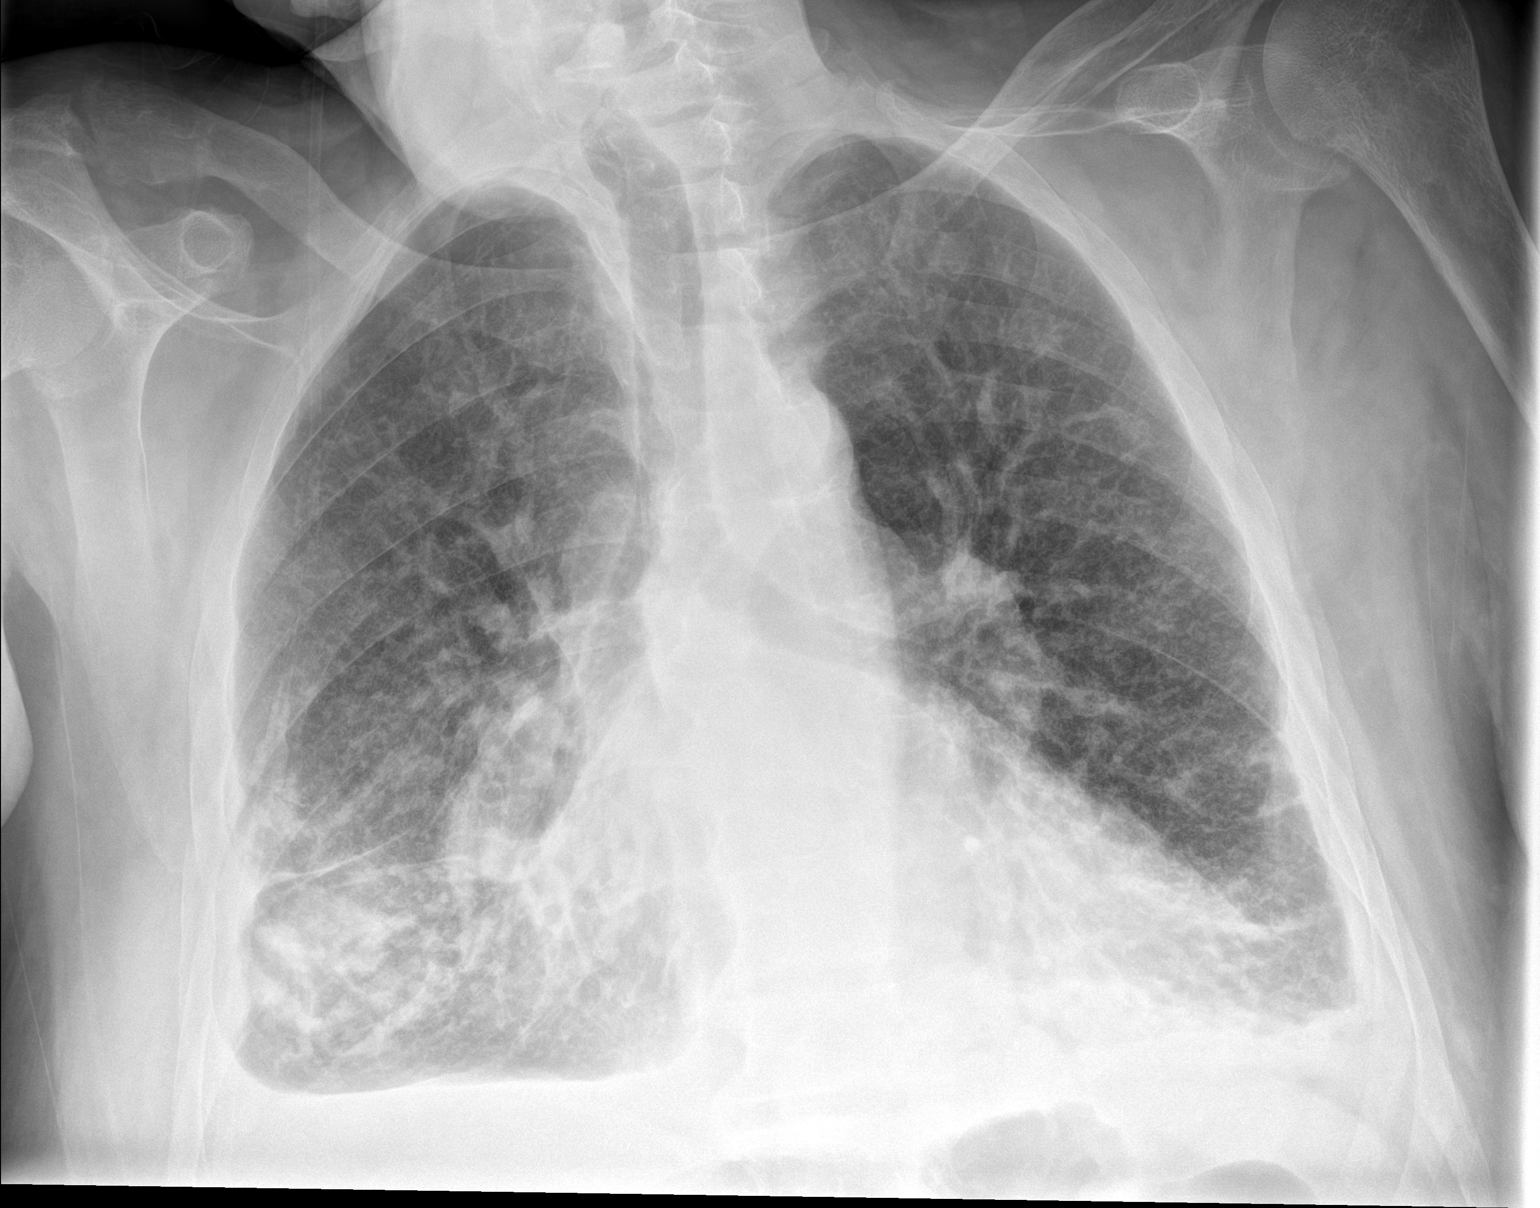

[chest lat]
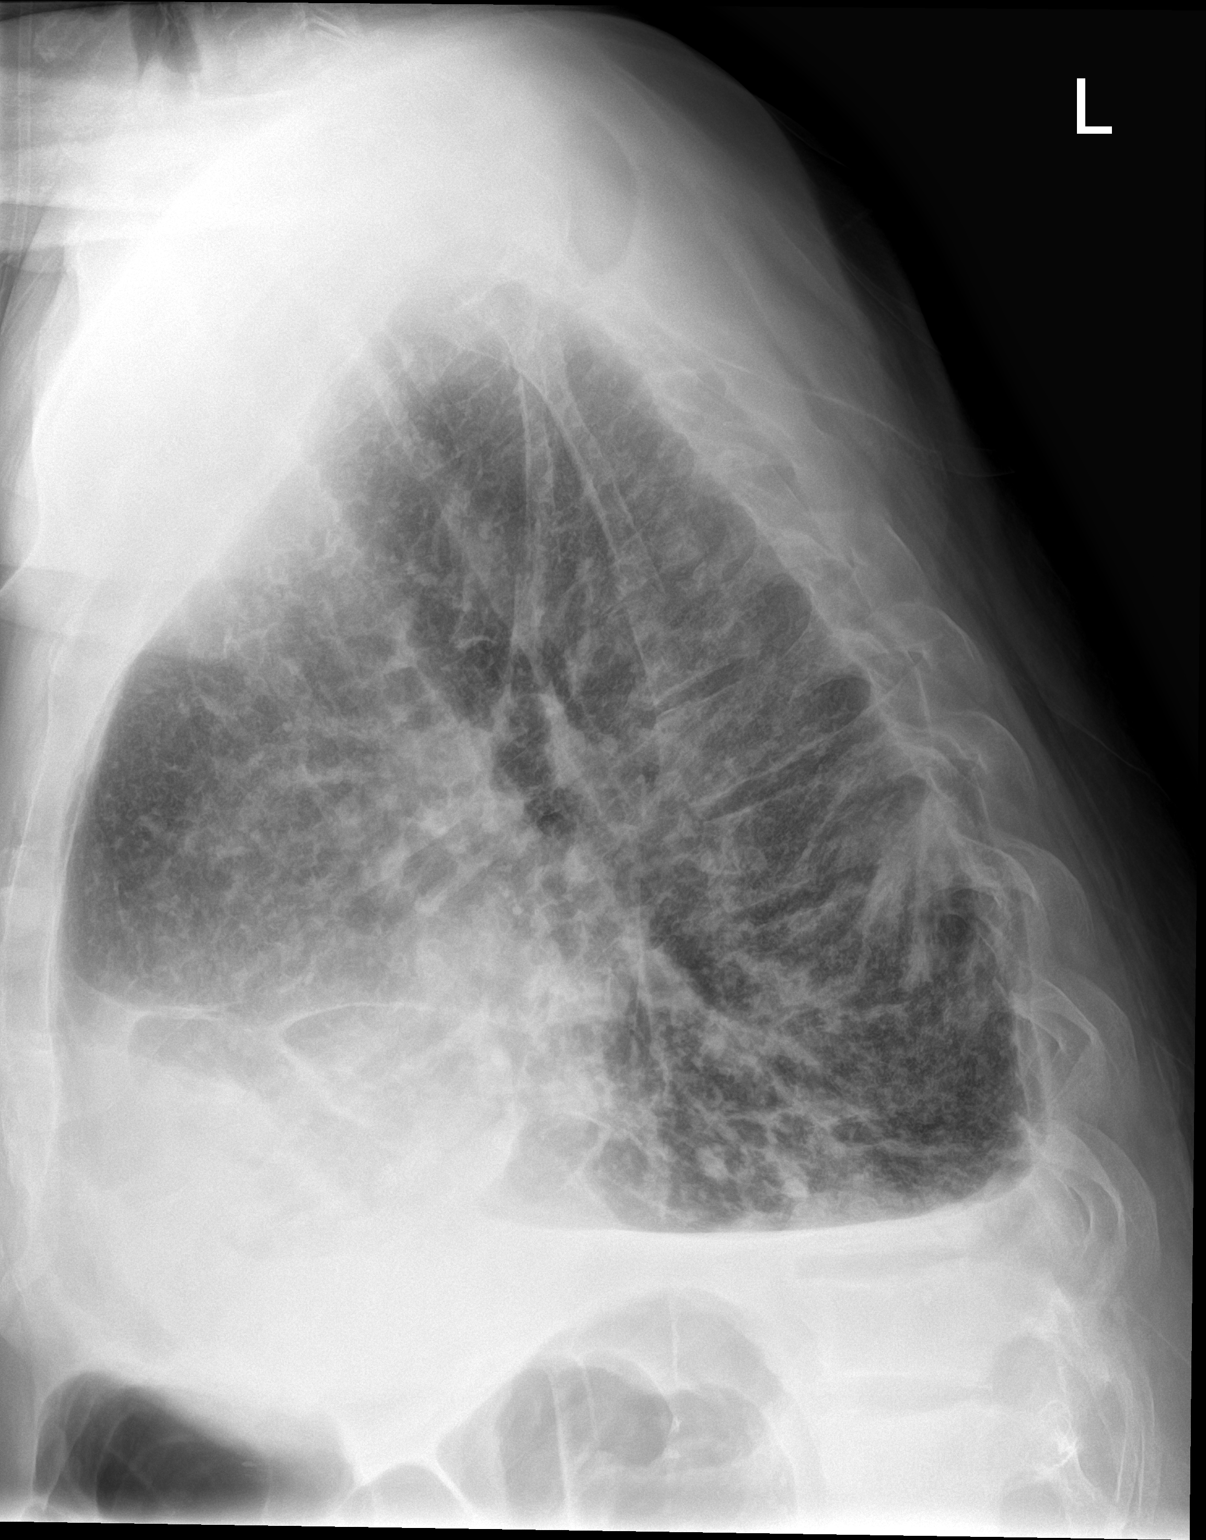

[2 of 2 positions shown; findings below may reference images not displayed]

FINDINGS: There is mild cardiomegaly. There is prominence of the central
pulmonary vasculature. Small bilateral pleural effusions are seen,
slightly improved from the prior exam. However there remains
increased patchy/interstitial opacities seen at both lung bases. No
acute osseous abnormality.
IMPRESSION: Pulmonary vascular congestion and increased interstitial patchy
airspace opacities at both lung bases, not significantly changed
since the prior exam which could represent pulmonary edema and/or
infectious etiology.

Small bilateral pleural effusions.

## 2021-11-08 IMAGING — DX DG HIP (WITH OR WITHOUT PELVIS) 2-3V*L*
3 series · 4 of 4 positions shown · non-contrast
Comparison: Left hip radiograph dated 04/22/2020.

CLINICAL DATA: 64-year-old male with hip pain.

EXAM:
DG HIP (WITH OR WITHOUT PELVIS) 2-3V LEFT

[Series 1: pelvis ap · 0.13mm/px · 2 of 2 slices shown]
[im 1/2]
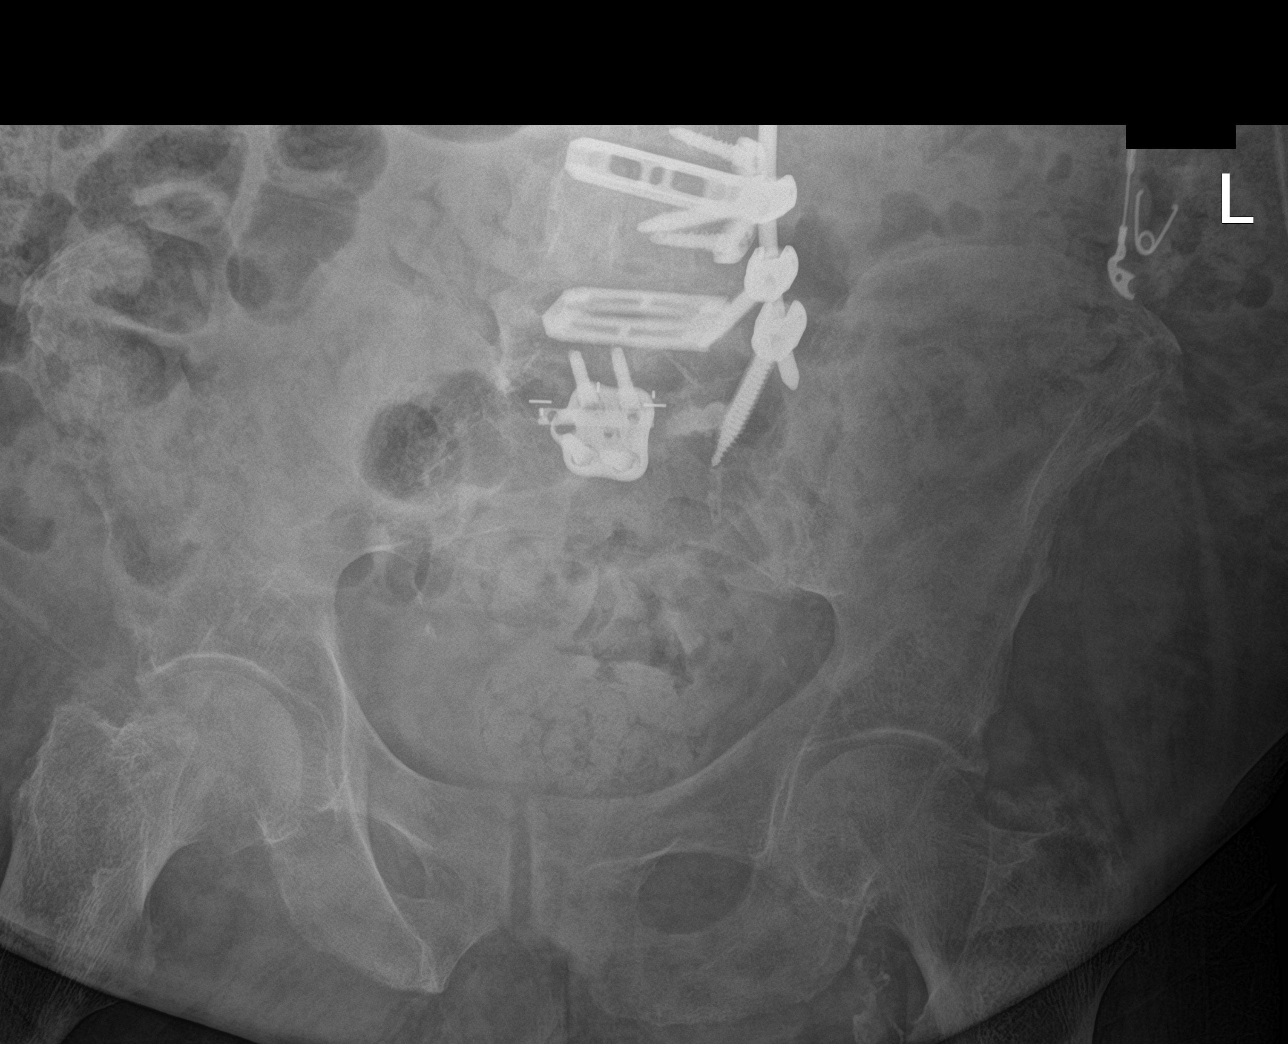
[im 2/2]
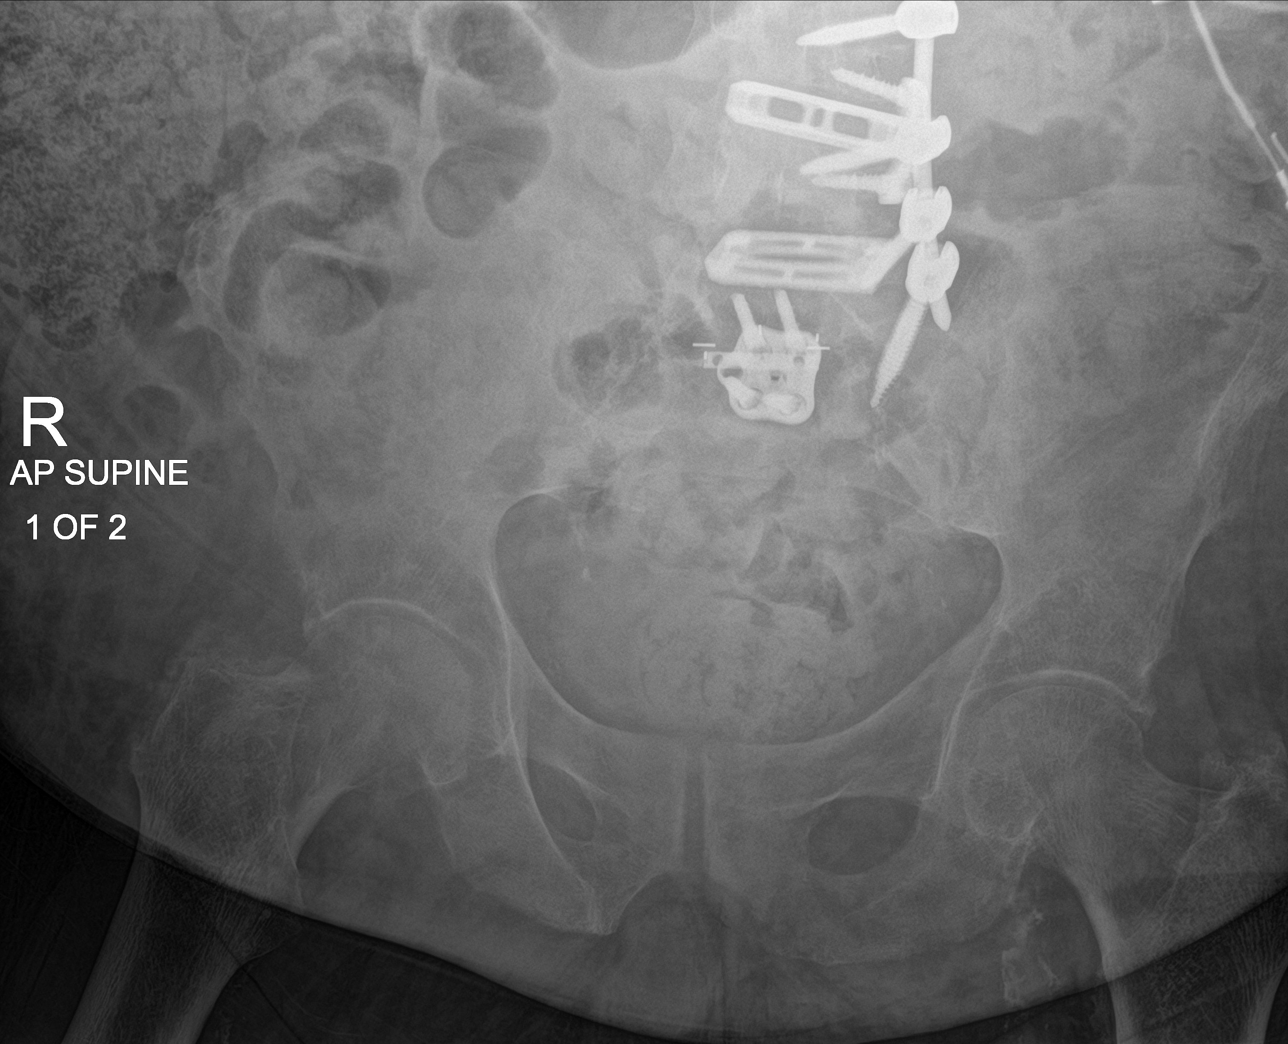

[hip ap]
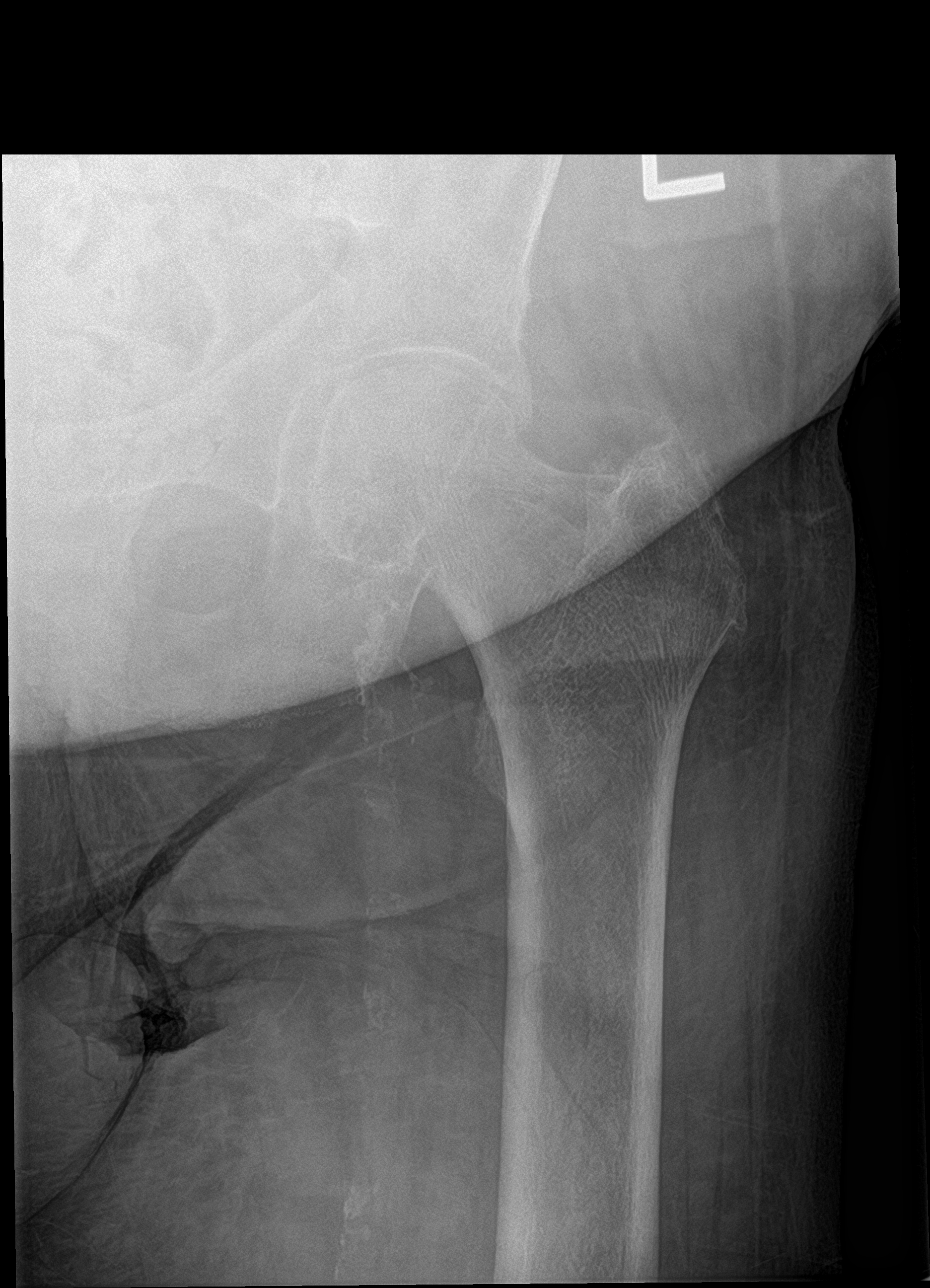

[hip lat]
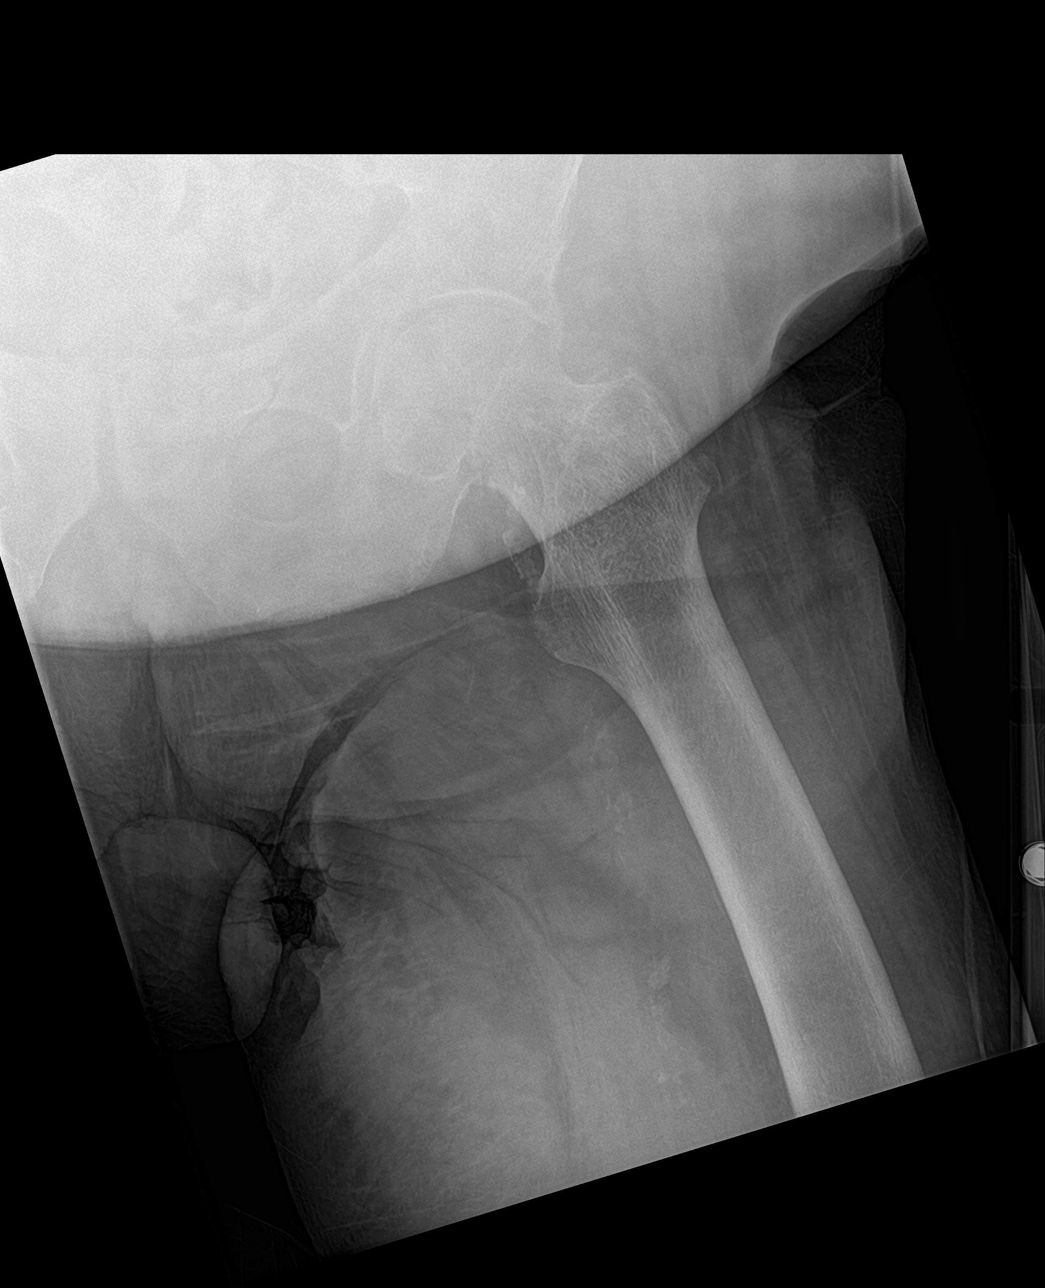

[4 of 4 positions shown; findings below may reference images not displayed]

FINDINGS: Evaluation is very limited due to body habitus and osteopenia.

No definite acute fracture identified. There is no dislocation. The
bones are osteopenic. Extensive lower lumbar fixation hardware. The
soft tissues are unremarkable.
IMPRESSION: No definite acute fracture or dislocation.

## 2021-11-08 IMAGING — DX DG HIP (WITH OR WITHOUT PELVIS) 1V PORT*L*
1 series · 1 of 1 positions shown · non-contrast
Comparison: None.

CLINICAL DATA: Pain following fall

EXAM:
DG HIP (WITH OR WITHOUT PELVIS) 1V PORT LEFT

[hip ap]
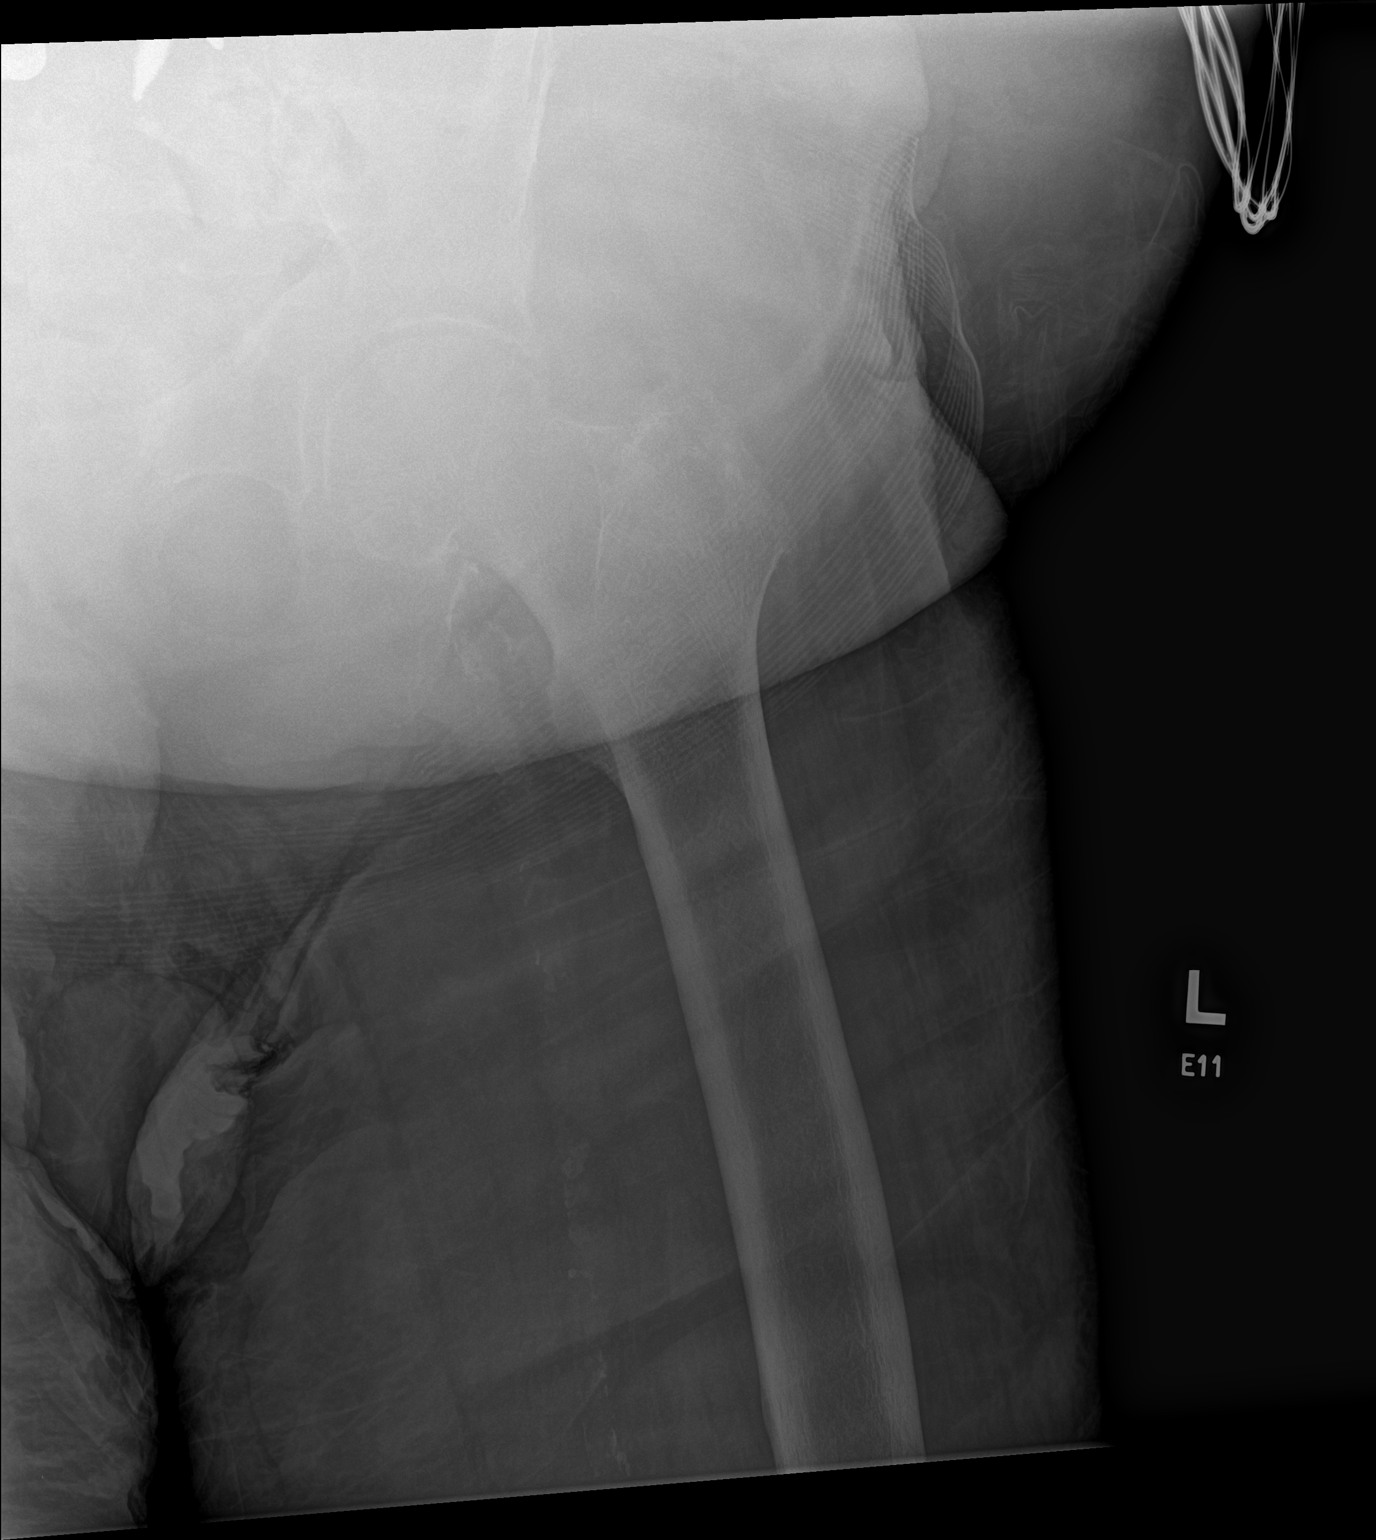

[1 of 1 positions shown; findings below may reference images not displayed]

FINDINGS: Frontal view left hip obtained. On frontal view, no fracture or
dislocation is appreciable. There is mild narrowing of the left hip
joint. No erosive change evident. There are multiple foci of
arterial vascular calcification
IMPRESSION: No fracture seen on frontal view. No dislocation. Mild narrowing
left hip joint.

Given history of trauma, correlation with lateral view may well be
advisable for more precise assessment.
